# Patient Record
Sex: Male | Born: 1950
Health system: Southern US, Community
[De-identification: ages and names within clinical notes are randomized; demographics above are authoritative.]

## PROBLEM LIST (undated history)

## (undated) DIAGNOSIS — M1990 Unspecified osteoarthritis, unspecified site: Secondary | ICD-10-CM

## (undated) DIAGNOSIS — E785 Hyperlipidemia, unspecified: Secondary | ICD-10-CM

## (undated) DIAGNOSIS — Z87891 Personal history of nicotine dependence: Secondary | ICD-10-CM

## (undated) DIAGNOSIS — T7840XA Allergy, unspecified, initial encounter: Secondary | ICD-10-CM

## (undated) DIAGNOSIS — J189 Pneumonia, unspecified organism: Secondary | ICD-10-CM

## (undated) DIAGNOSIS — R0600 Dyspnea, unspecified: Secondary | ICD-10-CM

## (undated) DIAGNOSIS — K429 Umbilical hernia without obstruction or gangrene: Secondary | ICD-10-CM

## (undated) DIAGNOSIS — R569 Unspecified convulsions: Secondary | ICD-10-CM

## (undated) DIAGNOSIS — I219 Acute myocardial infarction, unspecified: Secondary | ICD-10-CM

## (undated) DIAGNOSIS — I10 Essential (primary) hypertension: Secondary | ICD-10-CM

## (undated) DIAGNOSIS — I2510 Atherosclerotic heart disease of native coronary artery without angina pectoris: Secondary | ICD-10-CM

## (undated) DIAGNOSIS — R55 Syncope and collapse: Secondary | ICD-10-CM

## (undated) HISTORY — DX: Essential (primary) hypertension: I10

## (undated) HISTORY — PX: CARDIAC CATHETERIZATION: SHX172

## (undated) HISTORY — PX: KNEE SURGERY: SHX244

## (undated) HISTORY — DX: Allergy, unspecified, initial encounter: T78.40XA

## (undated) HISTORY — PX: HERNIA REPAIR: SHX51

## (undated) HISTORY — PX: CYST EXCISION: SHX5701

## (undated) HISTORY — PX: INGUINAL HERNIA REPAIR: SUR1180

---

## 1968-09-18 DIAGNOSIS — R569 Unspecified convulsions: Secondary | ICD-10-CM

## 1968-09-18 HISTORY — DX: Unspecified convulsions: R56.9

## 2001-03-04 ENCOUNTER — Encounter: Payer: Self-pay | Admitting: Specialist

## 2001-03-06 ENCOUNTER — Ambulatory Visit (HOSPITAL_COMMUNITY): Admission: RE | Admit: 2001-03-06 | Discharge: 2001-03-06 | Payer: Self-pay | Admitting: Specialist

## 2004-05-07 ENCOUNTER — Other Ambulatory Visit: Payer: Self-pay

## 2009-07-07 ENCOUNTER — Ambulatory Visit: Payer: Self-pay | Admitting: Gastroenterology

## 2009-09-18 HISTORY — PX: COLONOSCOPY: SHX174

## 2011-04-10 ENCOUNTER — Ambulatory Visit: Payer: Self-pay | Admitting: Family Medicine

## 2011-10-31 DIAGNOSIS — M26609 Unspecified temporomandibular joint disorder, unspecified side: Secondary | ICD-10-CM | POA: Diagnosis not present

## 2011-10-31 DIAGNOSIS — D179 Benign lipomatous neoplasm, unspecified: Secondary | ICD-10-CM | POA: Diagnosis not present

## 2011-10-31 DIAGNOSIS — E785 Hyperlipidemia, unspecified: Secondary | ICD-10-CM | POA: Diagnosis not present

## 2011-10-31 DIAGNOSIS — I10 Essential (primary) hypertension: Secondary | ICD-10-CM | POA: Diagnosis not present

## 2012-01-08 DIAGNOSIS — S8010XA Contusion of unspecified lower leg, initial encounter: Secondary | ICD-10-CM | POA: Diagnosis not present

## 2012-01-09 DIAGNOSIS — M7989 Other specified soft tissue disorders: Secondary | ICD-10-CM | POA: Diagnosis not present

## 2012-01-09 DIAGNOSIS — M79609 Pain in unspecified limb: Secondary | ICD-10-CM | POA: Diagnosis not present

## 2012-05-16 DIAGNOSIS — I10 Essential (primary) hypertension: Secondary | ICD-10-CM | POA: Diagnosis not present

## 2012-05-16 DIAGNOSIS — J3089 Other allergic rhinitis: Secondary | ICD-10-CM | POA: Diagnosis not present

## 2012-05-16 DIAGNOSIS — E785 Hyperlipidemia, unspecified: Secondary | ICD-10-CM | POA: Diagnosis not present

## 2012-06-29 DIAGNOSIS — Z23 Encounter for immunization: Secondary | ICD-10-CM | POA: Diagnosis not present

## 2012-07-04 DIAGNOSIS — E785 Hyperlipidemia, unspecified: Secondary | ICD-10-CM | POA: Diagnosis not present

## 2012-12-02 DIAGNOSIS — I10 Essential (primary) hypertension: Secondary | ICD-10-CM | POA: Diagnosis not present

## 2012-12-02 DIAGNOSIS — E785 Hyperlipidemia, unspecified: Secondary | ICD-10-CM | POA: Diagnosis not present

## 2012-12-02 DIAGNOSIS — J3089 Other allergic rhinitis: Secondary | ICD-10-CM | POA: Diagnosis not present

## 2013-06-11 DIAGNOSIS — E785 Hyperlipidemia, unspecified: Secondary | ICD-10-CM | POA: Diagnosis not present

## 2013-06-11 DIAGNOSIS — Z23 Encounter for immunization: Secondary | ICD-10-CM | POA: Diagnosis not present

## 2013-06-11 DIAGNOSIS — J3089 Other allergic rhinitis: Secondary | ICD-10-CM | POA: Diagnosis not present

## 2013-06-11 DIAGNOSIS — I10 Essential (primary) hypertension: Secondary | ICD-10-CM | POA: Diagnosis not present

## 2013-11-05 DIAGNOSIS — IMO0001 Reserved for inherently not codable concepts without codable children: Secondary | ICD-10-CM | POA: Diagnosis not present

## 2013-11-05 DIAGNOSIS — M999 Biomechanical lesion, unspecified: Secondary | ICD-10-CM | POA: Diagnosis not present

## 2013-11-05 DIAGNOSIS — M5137 Other intervertebral disc degeneration, lumbosacral region: Secondary | ICD-10-CM | POA: Diagnosis not present

## 2013-11-07 DIAGNOSIS — M999 Biomechanical lesion, unspecified: Secondary | ICD-10-CM | POA: Diagnosis not present

## 2013-11-07 DIAGNOSIS — IMO0001 Reserved for inherently not codable concepts without codable children: Secondary | ICD-10-CM | POA: Diagnosis not present

## 2013-11-07 DIAGNOSIS — M5137 Other intervertebral disc degeneration, lumbosacral region: Secondary | ICD-10-CM | POA: Diagnosis not present

## 2013-11-10 DIAGNOSIS — IMO0001 Reserved for inherently not codable concepts without codable children: Secondary | ICD-10-CM | POA: Diagnosis not present

## 2013-11-10 DIAGNOSIS — M5137 Other intervertebral disc degeneration, lumbosacral region: Secondary | ICD-10-CM | POA: Diagnosis not present

## 2013-11-10 DIAGNOSIS — M999 Biomechanical lesion, unspecified: Secondary | ICD-10-CM | POA: Diagnosis not present

## 2013-11-17 DIAGNOSIS — M999 Biomechanical lesion, unspecified: Secondary | ICD-10-CM | POA: Diagnosis not present

## 2013-11-17 DIAGNOSIS — IMO0001 Reserved for inherently not codable concepts without codable children: Secondary | ICD-10-CM | POA: Diagnosis not present

## 2013-11-17 DIAGNOSIS — M5137 Other intervertebral disc degeneration, lumbosacral region: Secondary | ICD-10-CM | POA: Diagnosis not present

## 2014-05-18 DIAGNOSIS — M26609 Unspecified temporomandibular joint disorder, unspecified side: Secondary | ICD-10-CM | POA: Diagnosis not present

## 2014-05-18 DIAGNOSIS — R233 Spontaneous ecchymoses: Secondary | ICD-10-CM | POA: Diagnosis not present

## 2014-05-18 DIAGNOSIS — M5382 Other specified dorsopathies, cervical region: Secondary | ICD-10-CM | POA: Diagnosis not present

## 2014-06-08 DIAGNOSIS — E663 Overweight: Secondary | ICD-10-CM | POA: Diagnosis not present

## 2014-06-08 DIAGNOSIS — J3089 Other allergic rhinitis: Secondary | ICD-10-CM | POA: Diagnosis not present

## 2014-06-08 DIAGNOSIS — E785 Hyperlipidemia, unspecified: Secondary | ICD-10-CM | POA: Diagnosis not present

## 2014-06-08 DIAGNOSIS — I10 Essential (primary) hypertension: Secondary | ICD-10-CM | POA: Diagnosis not present

## 2014-06-08 DIAGNOSIS — M159 Polyosteoarthritis, unspecified: Secondary | ICD-10-CM | POA: Diagnosis not present

## 2014-06-08 DIAGNOSIS — D179 Benign lipomatous neoplasm, unspecified: Secondary | ICD-10-CM | POA: Diagnosis not present

## 2014-06-08 DIAGNOSIS — M26609 Unspecified temporomandibular joint disorder, unspecified side: Secondary | ICD-10-CM | POA: Diagnosis not present

## 2015-07-12 ENCOUNTER — Encounter: Payer: Self-pay | Admitting: Family Medicine

## 2015-07-12 ENCOUNTER — Ambulatory Visit (INDEPENDENT_AMBULATORY_CARE_PROVIDER_SITE_OTHER): Payer: Medicare Other | Admitting: Family Medicine

## 2015-07-12 VITALS — BP 136/80 | HR 78 | Ht 69.0 in | Wt 201.0 lb

## 2015-07-12 DIAGNOSIS — J301 Allergic rhinitis due to pollen: Secondary | ICD-10-CM

## 2015-07-12 DIAGNOSIS — I10 Essential (primary) hypertension: Secondary | ICD-10-CM | POA: Diagnosis not present

## 2015-07-12 DIAGNOSIS — J40 Bronchitis, not specified as acute or chronic: Secondary | ICD-10-CM | POA: Diagnosis not present

## 2015-07-12 DIAGNOSIS — Z23 Encounter for immunization: Secondary | ICD-10-CM | POA: Diagnosis not present

## 2015-07-12 DIAGNOSIS — J0100 Acute maxillary sinusitis, unspecified: Secondary | ICD-10-CM

## 2015-07-12 DIAGNOSIS — E785 Hyperlipidemia, unspecified: Secondary | ICD-10-CM | POA: Diagnosis not present

## 2015-07-12 MED ORDER — LORATADINE 10 MG PO TABS: 10.0000 mg | ORAL_TABLET | Freq: Every day | ORAL | Status: DC | PRN

## 2015-07-12 MED ORDER — RAMIPRIL 10 MG PO CAPS: 20.0000 mg | ORAL_CAPSULE | Freq: Every day | ORAL | Status: DC

## 2015-07-12 MED ORDER — HYDROCHLOROTHIAZIDE 12.5 MG PO CAPS: 12.5000 mg | ORAL_CAPSULE | Freq: Every day | ORAL | Status: DC

## 2015-07-12 MED ORDER — HYDROCOD POLST-CPM POLST ER 10-8 MG/5ML PO SUER: 5.0000 mL | Freq: Two times a day (BID) | ORAL | Status: DC

## 2015-07-12 MED ORDER — FISH OIL 1000 MG PO CAPS: 1.0000 | ORAL_CAPSULE | Freq: Two times a day (BID) | ORAL | Status: DC

## 2015-07-12 MED ORDER — AMOXICILLIN 500 MG PO CAPS: 500.0000 mg | ORAL_CAPSULE | Freq: Three times a day (TID) | ORAL | Status: DC

## 2015-07-12 NOTE — Progress Notes (Signed)
Name: George Doyle   MRN: [id number]    DOB: 02/23/1951   Date:07/12/2015       Progress Note  Subjective  Chief Complaint  Chief Complaint  Patient presents with  . Sinusitis    taking OTC claritin  . Hypertension    refill meds- HCTZ and Altace    Sinusitis This is a chronic problem. The current episode started 1 to 4 weeks ago. The problem has been waxing and waning since onset. There has been no fever. Associated symptoms include congestion, coughing, ear pain, sinus pressure and sneezing. Pertinent negatives include no chills, diaphoresis, headaches, hoarse voice, neck pain, shortness of breath, sore throat or swollen glands. Past treatments include acetaminophen. The treatment provided no relief.  Hypertension This is a chronic problem. The current episode started more than 1 year ago. The problem has been gradually improving since onset. The problem is controlled. Pertinent negatives include no anxiety, blurred vision, chest pain, headaches, malaise/fatigue, neck pain, orthopnea, palpitations, peripheral edema, shortness of breath or sweats. There are no associated agents to hypertension. Past treatments include ACE inhibitors and diuretics. The current treatment provides mild improvement. There are no compliance problems.  There is no history of angina, kidney disease, CAD/MI, CVA, heart failure, left ventricular hypertrophy, PVD or renovascular disease. There is no history of chronic renal disease.  Sinus Problem This is a recurrent problem. The problem has been gradually worsening since onset. Associated symptoms include congestion, coughing, ear pain, sinus pressure and sneezing. Pertinent negatives include no chills, diaphoresis, headaches, hoarse voice, neck pain, shortness of breath, sore throat or swollen glands. (Pollen and "night air")  Hyperlipidemia This is a chronic problem. The current episode started more than 1 year ago. The problem is controlled. Recent lipid  tests were reviewed and are variable. He has no history of chronic renal disease, diabetes, hypothyroidism or obesity. Pertinent negatives include no chest pain, focal sensory loss, focal weakness, leg pain, myalgias or shortness of breath. He is currently on no antihyperlipidemic treatment. The current treatment provides moderate improvement of lipids. Risk factors for coronary artery disease include dyslipidemia.    No problem-specific assessment & plan notes found for this encounter.   Past Medical History  Diagnosis Date  . Hypertension   . Allergy     Past Surgical History  Procedure Laterality Date  . Knee surgery Bilateral     2 on R) 1 on L)  . Hernia repair    . Colonoscopy  2011    cleared for 10 yrs    History reviewed. No pertinent family history.  Social History   Social History  . Marital Status: Married    Spouse Name: N/A  . Number of Children: N/A  . Years of Education: N/A   Occupational History  . Not on file.   Social History Main Topics  . Smoking status: Former Research scientist (life sciences)  . Smokeless tobacco: Not on file  . Alcohol Use: No  . Drug Use: No  . Sexual Activity: Not Currently   Other Topics Concern  . Not on file   Social History Narrative  . No narrative on file    No Known Allergies   Review of Systems  Constitutional: Negative for fever, chills, weight loss, malaise/fatigue and diaphoresis.  HENT: Positive for congestion, ear pain, sinus pressure and sneezing. Negative for ear discharge, hoarse voice and sore throat.   Eyes: Negative for blurred vision.  Respiratory: Positive for cough. Negative for sputum production, shortness of breath and  wheezing.   Cardiovascular: Negative for chest pain, palpitations, orthopnea and leg swelling.  Gastrointestinal: Negative for heartburn, nausea, abdominal pain, diarrhea, constipation, blood in stool and melena.  Genitourinary: Negative for dysuria, urgency, frequency and hematuria.  Musculoskeletal:  Negative for myalgias, back pain, joint pain and neck pain.  Skin: Negative for rash.  Neurological: Negative for dizziness, tingling, sensory change, focal weakness and headaches.  Endo/Heme/Allergies: Negative for environmental allergies and polydipsia. Does not bruise/bleed easily.  Psychiatric/Behavioral: Negative for depression and suicidal ideas. The patient is not nervous/anxious and does not have insomnia.      Objective  Filed Vitals:   07/12/15 1052  BP: 136/80  Pulse: 78  Height: 5\' 9"  (1.753 m)  Weight: 201 lb (91.173 kg)    Physical Exam  Constitutional: He is oriented to person, place, and time and well-developed, well-nourished, and in no distress.  HENT:  Head: Normocephalic.  Right Ear: External ear normal.  Left Ear: External ear normal.  Nose: Nose normal.  Mouth/Throat: Oropharynx is clear and moist.  Eyes: Conjunctivae and EOM are normal. Pupils are equal, round, and reactive to light. Right eye exhibits no discharge. Left eye exhibits no discharge. No scleral icterus.  Neck: Normal range of motion. Neck supple. No JVD present. No tracheal deviation present. No thyromegaly present.  Cardiovascular: Normal rate, regular rhythm, normal heart sounds and intact distal pulses.  Exam reveals no gallop and no friction rub.   No murmur heard. Pulmonary/Chest: Breath sounds normal. No respiratory distress. He has no wheezes. He has no rales.  Abdominal: Soft. Bowel sounds are normal. He exhibits no mass. There is no hepatosplenomegaly. There is no tenderness. There is no rebound, no guarding and no CVA tenderness.  Musculoskeletal: Normal range of motion. He exhibits no edema or tenderness.  Lymphadenopathy:    He has no cervical adenopathy.  Neurological: He is alert and oriented to person, place, and time. He has normal sensation, normal strength, normal reflexes and intact cranial nerves. No cranial nerve deficit.  Skin: Skin is warm. No rash noted.  Psychiatric:  Mood and affect normal.      Assessment & Plan  Problem List Items Addressed This Visit    None    Visit Diagnoses    Acute maxillary sinusitis, recurrence not specified    -  Primary    Relevant Medications    hydrochlorothiazide (MICROZIDE) 12.5 MG capsule    loratadine (CLARITIN) 10 MG tablet    amoxicillin (AMOXIL) 500 MG capsule    chlorpheniramine-HYDROcodone (TUSSIONEX PENNKINETIC ER) 10-8 MG/5ML SUER    Bronchitis        Relevant Medications    hydrochlorothiazide (MICROZIDE) 12.5 MG capsule    Hyperlipidemia        Relevant Medications    hydrochlorothiazide (MICROZIDE) 12.5 MG capsule    ramipril (ALTACE) 10 MG capsule    Other Relevant Orders    Lipid Profile    Essential hypertension        Relevant Medications    hydrochlorothiazide (MICROZIDE) 12.5 MG capsule    ramipril (ALTACE) 10 MG capsule    Other Relevant Orders    Renal Function Panel    Allergic rhinitis due to pollen        Relevant Medications    chlorpheniramine-HYDROcodone (TUSSIONEX PENNKINETIC ER) 10-8 MG/5ML SUER    Need for pneumococcal vaccination        Relevant Orders    Pneumococcal polysaccharide vaccine 23-valent greater than or equal to 2yo subcutaneous/IM (Completed)  Dr. [facility]  07/12/2015

## 2015-07-13 LAB — LIPID PANEL
CHOL/HDL RATIO: 4.1 ratio (ref 0.0–5.0)
Cholesterol, Total: 229 mg/dL — ABNORMAL HIGH (ref 100–199)
HDL: 56 mg/dL (ref >39)
LDL Calculated: 140 mg/dL — ABNORMAL HIGH (ref 0–99)
TRIGLYCERIDES: 164 mg/dL — AB (ref 0–149)
VLDL CHOLESTEROL CAL: 33 mg/dL (ref 5–40)

## 2015-07-13 LAB — RENAL FUNCTION PANEL
ALBUMIN: 4.2 g/dL (ref 3.6–4.8)
BUN/Creatinine Ratio: 23 — ABNORMAL HIGH (ref 10–22)
BUN: 21 mg/dL (ref 8–27)
CO2: 26 mmol/L (ref 18–29)
CREATININE: 0.92 mg/dL (ref 0.76–1.27)
Calcium: 9.9 mg/dL (ref 8.6–10.2)
Chloride: 97 mmol/L (ref 97–106)
GFR, EST AFRICAN AMERICAN: 100 mL/min/{1.73_m2} (ref >59)
GFR, EST NON AFRICAN AMERICAN: 86 mL/min/{1.73_m2} (ref >59)
GLUCOSE: 67 mg/dL (ref 65–99)
PHOSPHORUS: 3.7 mg/dL (ref 2.5–4.5)
POTASSIUM: 4.2 mmol/L (ref 3.5–5.2)
Sodium: 141 mmol/L (ref 136–144)

## 2016-01-31 DIAGNOSIS — H40003 Preglaucoma, unspecified, bilateral: Secondary | ICD-10-CM | POA: Diagnosis not present

## 2016-01-31 DIAGNOSIS — H2513 Age-related nuclear cataract, bilateral: Secondary | ICD-10-CM | POA: Diagnosis not present

## 2016-01-31 DIAGNOSIS — H524 Presbyopia: Secondary | ICD-10-CM | POA: Diagnosis not present

## 2016-02-15 DIAGNOSIS — H401131 Primary open-angle glaucoma, bilateral, mild stage: Secondary | ICD-10-CM | POA: Diagnosis not present

## 2016-03-03 ENCOUNTER — Ambulatory Visit (INDEPENDENT_AMBULATORY_CARE_PROVIDER_SITE_OTHER): Payer: Medicare HMO | Admitting: Family Medicine

## 2016-03-03 ENCOUNTER — Encounter: Payer: Self-pay | Admitting: Family Medicine

## 2016-03-03 ENCOUNTER — Other Ambulatory Visit: Payer: Self-pay | Admitting: Family Medicine

## 2016-03-03 VITALS — BP 132/80 | HR 60 | Ht 69.0 in | Wt 200.0 lb

## 2016-03-03 DIAGNOSIS — M25551 Pain in right hip: Secondary | ICD-10-CM | POA: Diagnosis not present

## 2016-03-03 DIAGNOSIS — M25511 Pain in right shoulder: Secondary | ICD-10-CM | POA: Diagnosis not present

## 2016-03-03 DIAGNOSIS — I10 Essential (primary) hypertension: Secondary | ICD-10-CM | POA: Diagnosis not present

## 2016-03-03 DIAGNOSIS — E785 Hyperlipidemia, unspecified: Secondary | ICD-10-CM | POA: Diagnosis not present

## 2016-03-03 MED ORDER — RAMIPRIL 10 MG PO CAPS: 20.0000 mg | ORAL_CAPSULE | Freq: Every day | ORAL | Status: DC

## 2016-03-03 MED ORDER — HYDROCHLOROTHIAZIDE 12.5 MG PO CAPS: 12.5000 mg | ORAL_CAPSULE | Freq: Every day | ORAL | Status: DC

## 2016-03-03 MED ORDER — MELOXICAM 15 MG PO TABS: 15.0000 mg | ORAL_TABLET | Freq: Every day | ORAL | Status: DC

## 2016-03-03 NOTE — Progress Notes (Signed)
Name: George Doyle   MRN: [id number]    DOB: March 24, 1951   Date:03/03/2016       Progress Note  Subjective  Chief Complaint  Chief Complaint  Patient presents with  . Hypertension    Hypertension This is a chronic problem. The current episode started more than 1 year ago. The problem has been gradually improving since onset. The problem is controlled. Pertinent negatives include no anxiety, blurred vision, chest pain, headaches, malaise/fatigue, neck pain, orthopnea, palpitations, peripheral edema, PND, shortness of breath or sweats. There are no associated agents to hypertension. There are no known risk factors for coronary artery disease. Past treatments include ACE inhibitors and diuretics. The current treatment provides mild improvement. There are no compliance problems.  There is no history of angina, kidney disease, CAD/MI, CVA, heart failure, left ventricular hypertrophy, PVD, renovascular disease or retinopathy. There is no history of chronic renal disease or a hypertension causing med.  Hyperlipidemia This is a chronic problem. The current episode started more than 1 year ago. The problem is controlled. Recent lipid tests were reviewed and are normal. He has no history of chronic renal disease. Pertinent negatives include no chest pain, focal weakness, myalgias or shortness of breath. Treatments tried: omega 3. The current treatment provides mild improvement of lipids. There are no compliance problems.  Risk factors for coronary artery disease include hypertension.    No problem-specific assessment & plan notes found for this encounter.   Past Medical History  Diagnosis Date  . Hypertension   . Allergy     Past Surgical History  Procedure Laterality Date  . Knee surgery Bilateral     2 on R) 1 on L)  . Hernia repair    . Colonoscopy  2011    cleared for 10 yrs    History reviewed. No pertinent family history.  Social History   Social History  . Marital  Status: Married    Spouse Name: N/A  . Number of Children: N/A  . Years of Education: N/A   Occupational History  . Not on file.   Social History Main Topics  . Smoking status: Former Research scientist (life sciences)  . Smokeless tobacco: Not on file  . Alcohol Use: No  . Drug Use: No  . Sexual Activity: Not Currently   Other Topics Concern  . Not on file   Social History Narrative    No Known Allergies   Review of Systems  Constitutional: Negative for fever, chills, weight loss and malaise/fatigue.  HENT: Negative for ear discharge, ear pain and sore throat.   Eyes: Negative for blurred vision.  Respiratory: Negative for cough, sputum production, shortness of breath and wheezing.   Cardiovascular: Negative for chest pain, palpitations, orthopnea, leg swelling and PND.  Gastrointestinal: Negative for heartburn, nausea, abdominal pain, diarrhea, constipation, blood in stool and melena.  Genitourinary: Negative for dysuria, urgency, frequency and hematuria.  Musculoskeletal: Negative for myalgias, back pain, joint pain and neck pain.  Skin: Negative for rash.  Neurological: Negative for dizziness, tingling, sensory change, focal weakness and headaches.  Endo/Heme/Allergies: Negative for environmental allergies and polydipsia. Does not bruise/bleed easily.  Psychiatric/Behavioral: Negative for depression and suicidal ideas. The patient is not nervous/anxious and does not have insomnia.      Objective  Filed Vitals:   03/03/16 0850  BP: 132/80  Pulse: 60  Height: 5\' 9"  (1.753 m)  Weight: 200 lb (90.719 kg)    Physical Exam  Constitutional: He is oriented to person, place, and time  and well-developed, well-nourished, and in no distress.  HENT:  Head: Normocephalic.  Right Ear: External ear normal.  Left Ear: External ear normal.  Nose: Nose normal.  Mouth/Throat: Oropharynx is clear and moist.  Eyes: Conjunctivae and EOM are normal. Pupils are equal, round, and reactive to light. Right eye  exhibits no discharge. Left eye exhibits no discharge. No scleral icterus.  Neck: Normal range of motion. Neck supple. No JVD present. No tracheal deviation present. No thyromegaly present.  Cardiovascular: Normal rate, regular rhythm, normal heart sounds and intact distal pulses.  Exam reveals no gallop and no friction rub.   No murmur heard. Pulmonary/Chest: Breath sounds normal. No respiratory distress. He has no wheezes. He has no rales.  Abdominal: Soft. Bowel sounds are normal. He exhibits no mass. There is no hepatosplenomegaly. There is no tenderness. There is no rebound, no guarding and no CVA tenderness.  Musculoskeletal: Normal range of motion. He exhibits no edema or tenderness.  Lymphadenopathy:    He has no cervical adenopathy.  Neurological: He is alert and oriented to person, place, and time. He has normal sensation, normal strength, normal reflexes and intact cranial nerves. No cranial nerve deficit.  Skin: Skin is warm. No rash noted.  Psychiatric: Mood and affect normal.  Nursing note and vitals reviewed.     Assessment & Plan  Problem List Items Addressed This Visit      Cardiovascular and Mediastinum   Essential hypertension - Primary   Relevant Medications   hydrochlorothiazide (MICROZIDE) 12.5 MG capsule   ramipril (ALTACE) 10 MG capsule    Other Visit Diagnoses    Hip pain, acute, right        Relevant Medications    meloxicam (MOBIC) 15 MG tablet    Right shoulder pain        Relevant Medications    meloxicam (MOBIC) 15 MG tablet    Hyperlipidemia        Relevant Medications    hydrochlorothiazide (MICROZIDE) 12.5 MG capsule    ramipril (ALTACE) 10 MG capsule    Other Relevant Orders    Lipid Profile         Dr. Otilio Miu [facility]  03/03/2016

## 2016-03-04 LAB — LIPID PANEL
CHOL/HDL RATIO: 3.2 ratio (ref 0.0–5.0)
Cholesterol, Total: 201 mg/dL — ABNORMAL HIGH (ref 100–199)
HDL: 63 mg/dL (ref >39)
LDL CALC: 120 mg/dL — AB (ref 0–99)
TRIGLYCERIDES: 90 mg/dL (ref 0–149)
VLDL CHOLESTEROL CAL: 18 mg/dL (ref 5–40)

## 2016-05-16 DIAGNOSIS — H401131 Primary open-angle glaucoma, bilateral, mild stage: Secondary | ICD-10-CM | POA: Diagnosis not present

## 2016-05-16 DIAGNOSIS — H2513 Age-related nuclear cataract, bilateral: Secondary | ICD-10-CM | POA: Diagnosis not present

## 2016-05-16 DIAGNOSIS — H524 Presbyopia: Secondary | ICD-10-CM | POA: Diagnosis not present

## 2016-05-16 DIAGNOSIS — H40003 Preglaucoma, unspecified, bilateral: Secondary | ICD-10-CM | POA: Diagnosis not present

## 2016-07-12 DIAGNOSIS — R69 Illness, unspecified: Secondary | ICD-10-CM | POA: Diagnosis not present

## 2016-10-12 ENCOUNTER — Ambulatory Visit (INDEPENDENT_AMBULATORY_CARE_PROVIDER_SITE_OTHER): Payer: Medicare HMO | Admitting: Family Medicine

## 2016-10-12 VITALS — BP 120/84 | HR 68 | Ht 69.0 in | Wt 203.0 lb

## 2016-10-12 DIAGNOSIS — I10 Essential (primary) hypertension: Secondary | ICD-10-CM | POA: Diagnosis not present

## 2016-10-12 DIAGNOSIS — E782 Mixed hyperlipidemia: Secondary | ICD-10-CM | POA: Diagnosis not present

## 2016-10-12 DIAGNOSIS — Z1211 Encounter for screening for malignant neoplasm of colon: Secondary | ICD-10-CM

## 2016-10-12 DIAGNOSIS — Z23 Encounter for immunization: Secondary | ICD-10-CM | POA: Diagnosis not present

## 2016-10-12 LAB — HEMOCCULT GUIAC POC 1CARD (OFFICE): Fecal Occult Blood, POC: NEGATIVE

## 2016-10-12 LAB — POCT URINALYSIS DIPSTICK
Bilirubin, UA: NEGATIVE
Blood, UA: NEGATIVE
Glucose, UA: NEGATIVE
KETONES UA: NEGATIVE
Leukocytes, UA: NEGATIVE
Nitrite, UA: NEGATIVE
PH UA: 5
PROTEIN UA: NEGATIVE
Spec Grav, UA: <=1.005
Urobilinogen, UA: 0.2

## 2016-10-12 MED ORDER — HYDROCHLOROTHIAZIDE 12.5 MG PO CAPS: 12.5000 mg | ORAL_CAPSULE | Freq: Every day | ORAL | 6 refills | Status: DC

## 2016-10-12 MED ORDER — RAMIPRIL 10 MG PO CAPS: 20.0000 mg | ORAL_CAPSULE | Freq: Every day | ORAL | 6 refills | Status: DC

## 2016-10-12 MED ORDER — RAMIPRIL 10 MG PO CAPS: 20.0000 mg | ORAL_CAPSULE | Freq: Every day | ORAL | 7 refills | Status: DC

## 2016-10-12 MED ORDER — FISH OIL 1000 MG PO CAPS: 1.0000 | ORAL_CAPSULE | Freq: Two times a day (BID) | ORAL | 11 refills | Status: DC

## 2016-10-12 MED ORDER — HYDROCHLOROTHIAZIDE 12.5 MG PO CAPS: 12.5000 mg | ORAL_CAPSULE | Freq: Every day | ORAL | 7 refills | Status: DC

## 2016-10-12 NOTE — Progress Notes (Signed)
Name: George Doyle   MRN: [id number]    DOB: [birth year]   Date:10/12/2016       Progress Note  Subjective  Chief Complaint  Chief Complaint  Patient presents with  . Hypertension    Hypertension  This is a chronic problem. The current episode started more than 1 year ago. The problem is unchanged. The problem is controlled. Pertinent negatives include no anxiety, blurred vision, chest pain, headaches, malaise/fatigue, neck pain, orthopnea, palpitations, peripheral edema, PND, shortness of breath or sweats. There are no associated agents to hypertension. Risk factors for coronary artery disease include male gender. Past treatments include diuretics and ACE inhibitors. The current treatment provides mild improvement. There are no compliance problems.  There is no history of angina, kidney disease, CAD/MI, CVA, heart failure, left ventricular hypertrophy, PVD, renovascular disease or retinopathy. There is no history of chronic renal disease or a hypertension causing med.  Hyperlipidemia  This is a chronic problem. The problem is controlled. Recent lipid tests were reviewed and are normal. He has no history of chronic renal disease. Factors aggravating his hyperlipidemia include fatty foods. Pertinent negatives include no chest pain, focal sensory loss, focal weakness, leg pain, myalgias or shortness of breath. Treatments tried: omega. The current treatment provides no improvement of lipids. There are no compliance problems.  Risk factors for coronary artery disease include hypertension and dyslipidemia.    No problem-specific Assessment & Plan notes found for this encounter.   Past Medical History:  Diagnosis Date  . Allergy   . Hypertension     Past Surgical History:  Procedure Laterality Date  . COLONOSCOPY  2011   cleared for 10 yrs  . HERNIA REPAIR    . KNEE SURGERY Bilateral    2 on R) 1 on L)    No family history on file.  Social History   Social History  .  Marital status: Married    Spouse name: N/A  . Number of children: N/A  . Years of education: N/A   Occupational History  . Not on file.   Social History Main Topics  . Smoking status: Former Research scientist (life sciences)  . Smokeless tobacco: Not on file  . Alcohol use No  . Drug use: No  . Sexual activity: Not Currently   Other Topics Concern  . Not on file   Social History Narrative  . No narrative on file    No Known Allergies   Review of Systems  Constitutional: Negative for chills, fever, malaise/fatigue and weight loss.  HENT: Negative for ear discharge, ear pain and sore throat.   Eyes: Negative for blurred vision.  Respiratory: Negative for cough, sputum production, shortness of breath and wheezing.   Cardiovascular: Negative for chest pain, palpitations, orthopnea, leg swelling and PND.  Gastrointestinal: Negative for abdominal pain, blood in stool, constipation, diarrhea, heartburn, melena and nausea.  Genitourinary: Negative for dysuria, frequency, hematuria and urgency.  Musculoskeletal: Negative for back pain, joint pain, myalgias and neck pain.  Skin: Negative for rash.  Neurological: Negative for dizziness, tingling, sensory change, focal weakness and headaches.  Endo/Heme/Allergies: Negative for environmental allergies and polydipsia. Does not bruise/bleed easily.  Psychiatric/Behavioral: Negative for depression and suicidal ideas. The patient is not nervous/anxious and does not have insomnia.      Objective  Vitals:   10/12/16 0858  BP: 120/84  Pulse: 68  Weight: 203 lb (92.1 kg)  Height: 5\' 9"  (1.753 m)    Physical Exam  Constitutional: He is oriented to person,  place, and time and well-developed, well-nourished, and in no distress.  HENT:  Head: Normocephalic.  Right Ear: External ear normal.  Left Ear: External ear normal.  Nose: Nose normal.  Mouth/Throat: Oropharynx is clear and moist.  Eyes: Conjunctivae and EOM are normal. Pupils are equal, round, and  reactive to light. Right eye exhibits no discharge. Left eye exhibits no discharge. No scleral icterus.  Neck: Normal range of motion. Neck supple. No JVD present. No tracheal deviation present. No thyromegaly present.  Cardiovascular: Normal rate, regular rhythm, normal heart sounds and intact distal pulses.  Exam reveals no gallop and no friction rub.   No murmur heard. Pulmonary/Chest: Breath sounds normal. No respiratory distress. He has no wheezes. He has no rales.  Abdominal: Soft. Bowel sounds are normal. He exhibits no mass. There is no hepatosplenomegaly. There is no tenderness. There is no rebound, no guarding and no CVA tenderness.  Musculoskeletal: Normal range of motion. He exhibits no edema or tenderness.  Lymphadenopathy:    He has no cervical adenopathy.  Neurological: He is alert and oriented to person, place, and time. He has normal sensation, normal strength and intact cranial nerves. No cranial nerve deficit.  Skin: Skin is warm. No rash noted.  Psychiatric: Mood and affect normal.  Nursing note and vitals reviewed.     Assessment & Plan  Problem List Items Addressed This Visit      Cardiovascular and Mediastinum   Essential hypertension - Primary   Relevant Medications   hydrochlorothiazide (MICROZIDE) 12.5 MG capsule   ramipril (ALTACE) 10 MG capsule   Other Relevant Orders   Renal Function Panel   POCT urinalysis dipstick (Completed)    Other Visit Diagnoses    Mixed hyperlipidemia       Relevant Medications   Omega-3 Fatty Acids (FISH OIL) 1000 MG CAPS   hydrochlorothiazide (MICROZIDE) 12.5 MG capsule   ramipril (ALTACE) 10 MG capsule   Other Relevant Orders   Lipid Profile   Immunization due       Relevant Orders   Tdap vaccine greater than or equal to 7yo IM (Completed)   Pneumococcal conjugate vaccine 13-valent IM (Completed)   Colon cancer screening       Relevant Orders   Ambulatory referral to Gastroenterology   POCT occult blood stool  (Completed)        Dr. Otilio Miu [facility]  10/12/16

## 2016-10-13 DIAGNOSIS — I739 Peripheral vascular disease, unspecified: Secondary | ICD-10-CM | POA: Diagnosis not present

## 2016-10-13 LAB — RENAL FUNCTION PANEL
Albumin: 4.4 g/dL (ref 3.6–4.8)
BUN / CREAT RATIO: 21 (ref 10–24)
BUN: 19 mg/dL (ref 8–27)
CALCIUM: 9.6 mg/dL (ref 8.6–10.2)
CHLORIDE: 98 mmol/L (ref 96–106)
CO2: 27 mmol/L (ref 18–29)
Creatinine, Ser: 0.91 mg/dL (ref 0.76–1.27)
GFR calc non Af Amer: 87 mL/min/{1.73_m2} (ref >59)
GFR, EST AFRICAN AMERICAN: 100 mL/min/{1.73_m2} (ref >59)
GLUCOSE: 86 mg/dL (ref 65–99)
POTASSIUM: 5 mmol/L (ref 3.5–5.2)
Phosphorus: 2.8 mg/dL (ref 2.5–4.5)
SODIUM: 141 mmol/L (ref 134–144)

## 2016-10-13 LAB — LIPID PANEL
CHOLESTEROL TOTAL: 223 mg/dL — AB (ref 100–199)
Chol/HDL Ratio: 3.5 ratio units (ref 0.0–5.0)
HDL: 64 mg/dL (ref >39)
LDL Calculated: 136 mg/dL — ABNORMAL HIGH (ref 0–99)
Triglycerides: 113 mg/dL (ref 0–149)
VLDL CHOLESTEROL CAL: 23 mg/dL (ref 5–40)

## 2016-10-20 ENCOUNTER — Telehealth: Payer: Self-pay

## 2016-10-20 ENCOUNTER — Other Ambulatory Visit: Payer: Self-pay

## 2016-10-20 NOTE — Telephone Encounter (Signed)
Gastroenterology Pre-Procedure Review  Request Date:  Requesting Physician: Dr.   PATIENT REVIEW QUESTIONS: The patient responded to the following health history questions as indicated:    1. Are you having any GI issues? no 2. Do you have a personal history of Polyps? no 3. Do you have a family history of Colon Cancer or Polyps? no 4. Diabetes Mellitus? no 5. Joint replacements in the past 12 months?no 6. Major health problems in the past 3 months?no 7. Any artificial heart valves, MVP, or defibrillator?no    MEDICATIONS & ALLERGIES:    Patient reports the following regarding taking any anticoagulation/antiplatelet therapy:   Plavix, Coumadin, Eliquis, Xarelto, Lovenox, Pradaxa, Brilinta, or Effient? no Aspirin? no  Patient confirms/reports the following medications:  Current Outpatient Prescriptions  Medication Sig Dispense Refill  . hydrochlorothiazide (MICROZIDE) 12.5 MG capsule Take 1 capsule (12.5 mg total) by mouth daily. 30 capsule 6  . Omega-3 Fatty Acids (FISH OIL) 1000 MG CAPS Take 1 capsule (1,000 mg total) by mouth 2 (two) times daily. 30 capsule 11  . ramipril (ALTACE) 10 MG capsule Take 2 capsules (20 mg total) by mouth daily. 60 capsule 6   No current facility-administered medications for this visit.     Patient confirms/reports the following allergies:  No Known Allergies  No orders of the defined types were placed in this encounter.   AUTHORIZATION INFORMATION Primary Insurance: 1D#: Group #:  Secondary Insurance: 1D#: Group #:  SCHEDULE INFORMATION: Date: 11/02/16 Time: Location: [facility]

## 2016-10-23 DIAGNOSIS — H40003 Preglaucoma, unspecified, bilateral: Secondary | ICD-10-CM | POA: Diagnosis not present

## 2016-10-23 DIAGNOSIS — H401131 Primary open-angle glaucoma, bilateral, mild stage: Secondary | ICD-10-CM | POA: Diagnosis not present

## 2016-10-23 DIAGNOSIS — H2513 Age-related nuclear cataract, bilateral: Secondary | ICD-10-CM | POA: Diagnosis not present

## 2016-10-23 DIAGNOSIS — H524 Presbyopia: Secondary | ICD-10-CM | POA: Diagnosis not present

## 2016-10-31 ENCOUNTER — Encounter: Payer: Self-pay | Admitting: *Deleted

## 2016-11-01 ENCOUNTER — Other Ambulatory Visit: Payer: Self-pay

## 2016-11-01 MED ORDER — PEG 3350-KCL-NABCB-NACL-NASULF 236 G PO SOLR: ORAL | 0 refills | Status: DC

## 2016-11-03 NOTE — Discharge Instructions (Signed)

## 2016-11-06 ENCOUNTER — Ambulatory Visit
Admission: RE | Admit: 2016-11-06 | Discharge: 2016-11-06 | Disposition: A | Discharge status: Home / Self Care | Payer: Medicare HMO | Source: Ambulatory Visit | Attending: Gastroenterology | Admitting: Gastroenterology

## 2016-11-06 ENCOUNTER — Ambulatory Visit: Payer: Medicare HMO | Admitting: Student in an Organized Health Care Education/Training Program

## 2016-11-06 ENCOUNTER — Telehealth: Payer: Self-pay

## 2016-11-06 ENCOUNTER — Encounter
Admission: RE | Disposition: A | Discharge status: Home / Self Care | Payer: Self-pay | Source: Ambulatory Visit | Attending: Gastroenterology

## 2016-11-06 DIAGNOSIS — K641 Second degree hemorrhoids: Secondary | ICD-10-CM | POA: Diagnosis not present

## 2016-11-06 DIAGNOSIS — D125 Benign neoplasm of sigmoid colon: Secondary | ICD-10-CM | POA: Diagnosis not present

## 2016-11-06 DIAGNOSIS — Z87891 Personal history of nicotine dependence: Secondary | ICD-10-CM | POA: Diagnosis not present

## 2016-11-06 DIAGNOSIS — M1990 Unspecified osteoarthritis, unspecified site: Secondary | ICD-10-CM | POA: Diagnosis not present

## 2016-11-06 DIAGNOSIS — Z79899 Other long term (current) drug therapy: Secondary | ICD-10-CM | POA: Insufficient documentation

## 2016-11-06 DIAGNOSIS — K5730 Diverticulosis of large intestine without perforation or abscess without bleeding: Secondary | ICD-10-CM | POA: Insufficient documentation

## 2016-11-06 DIAGNOSIS — D122 Benign neoplasm of ascending colon: Secondary | ICD-10-CM | POA: Diagnosis not present

## 2016-11-06 DIAGNOSIS — Z1211 Encounter for screening for malignant neoplasm of colon: Secondary | ICD-10-CM | POA: Diagnosis not present

## 2016-11-06 DIAGNOSIS — I10 Essential (primary) hypertension: Secondary | ICD-10-CM | POA: Diagnosis not present

## 2016-11-06 DIAGNOSIS — K635 Polyp of colon: Secondary | ICD-10-CM

## 2016-11-06 DIAGNOSIS — Z01810 Encounter for preprocedural cardiovascular examination: Secondary | ICD-10-CM

## 2016-11-06 HISTORY — PX: POLYPECTOMY: SHX5525

## 2016-11-06 HISTORY — DX: Unspecified osteoarthritis, unspecified site: M19.90

## 2016-11-06 HISTORY — PX: COLONOSCOPY WITH PROPOFOL: SHX5780

## 2016-11-06 HISTORY — DX: Unspecified convulsions: R56.9

## 2016-11-06 SURGERY — COLONOSCOPY WITH PROPOFOL
Anesthesia: Monitor Anesthesia Care | Wound class: Contaminated

## 2016-11-06 MED ORDER — STERILE WATER FOR IRRIGATION IR SOLN
Status: DC | PRN
  Administered 2016-11-06: 08:00:00

## 2016-11-06 MED ORDER — ACETAMINOPHEN 160 MG/5ML PO SOLN: 325.0000 mg | ORAL | Status: DC | PRN

## 2016-11-06 MED ORDER — PROPOFOL 10 MG/ML IV BOLUS
INTRAVENOUS | Status: DC | PRN
  Administered 2016-11-06 (×6): 20 mg via INTRAVENOUS
  Administered 2016-11-06: 60 mg via INTRAVENOUS
  Administered 2016-11-06: 20 mg via INTRAVENOUS
  Administered 2016-11-06: 40 mg via INTRAVENOUS
  Administered 2016-11-06: 30 mg via INTRAVENOUS

## 2016-11-06 MED ORDER — LACTATED RINGERS IV SOLN
INTRAVENOUS | Status: DC
  Administered 2016-11-06 (×2): via INTRAVENOUS

## 2016-11-06 MED ORDER — LIDOCAINE HCL (CARDIAC) 20 MG/ML IV SOLN
INTRAVENOUS | Status: DC | PRN
  Administered 2016-11-06: 20 mg via INTRAVENOUS

## 2016-11-06 MED ORDER — ACETAMINOPHEN 325 MG PO TABS: 325.0000 mg | ORAL_TABLET | ORAL | Status: DC | PRN

## 2016-11-06 SURGICAL SUPPLY — 23 items
CANISTER SUCT 1200ML W/VALVE (MISCELLANEOUS) ×3 IMPLANT
CLIP HMST 235XBRD CATH ROT (MISCELLANEOUS) IMPLANT
CLIP RESOLUTION 360 11X235 (MISCELLANEOUS)
FCP ESCP3.2XJMB 240X2.8X (MISCELLANEOUS)
FORCEPS BIOP RAD 4 LRG CAP 4 (CUTTING FORCEPS) ×3 IMPLANT
FORCEPS BIOP RJ4 240 W/NDL (MISCELLANEOUS)
FORCEPS ESCP3.2XJMB 240X2.8X (MISCELLANEOUS) IMPLANT
GOWN CVR UNV OPN BCK APRN NK (MISCELLANEOUS) ×4 IMPLANT
GOWN ISOL THUMB LOOP REG UNIV (MISCELLANEOUS) ×6
INJECTOR VARIJECT VIN23 (MISCELLANEOUS) IMPLANT
KIT DEFENDO VALVE AND CONN (KITS) IMPLANT
KIT ENDO PROCEDURE OLY (KITS) ×3 IMPLANT
MARKER SPOT ENDO TATTOO 5ML (MISCELLANEOUS) IMPLANT
PAD GROUND ADULT SPLIT (MISCELLANEOUS) IMPLANT
PROBE APC STR FIRE (PROBE) IMPLANT
RETRIEVER NET ROTH 2.5X230 LF (MISCELLANEOUS) IMPLANT
SNARE SHORT THROW 13M SML OVAL (MISCELLANEOUS) ×3 IMPLANT
SNARE SHORT THROW 30M LRG OVAL (MISCELLANEOUS) IMPLANT
SNARE SNG USE RND 15MM (INSTRUMENTS) IMPLANT
SPOT EX ENDOSCOPIC TATTOO (MISCELLANEOUS)
TRAP ETRAP POLY (MISCELLANEOUS) ×3 IMPLANT
VARIJECT INJECTOR VIN23 (MISCELLANEOUS)
WATER STERILE IRR 250ML POUR (IV SOLUTION) ×3 IMPLANT

## 2016-11-06 NOTE — H&P (Signed)
  George Lame, MD [facility] [address], [place], [place] [address] Phone: [phone] Fax : [phone]  Primary Care Physician:  Otilio Miu, MD Primary Gastroenterologist:  Dr. Allen Norris  Pre-Procedure History & Physical: HPI:  George Doyle is a 66 y.o. male is here for a screening colonoscopy.   Past Medical History:  Diagnosis Date  . Allergy   . Arthritis    knees, hands  . Hypertension   . Seizures ([place])    approx 25+ yrs ago.  Testing showed no cause. None since    Past Surgical History:  Procedure Laterality Date  . COLONOSCOPY  2011   cleared for 10 yrs  . HERNIA REPAIR    . KNEE SURGERY Bilateral    2 on R) 1 on L)    Prior to Admission medications   Medication Sig Start Date End Date Taking? Authorizing Provider  hydrochlorothiazide (MICROZIDE) 12.5 MG capsule Take 1 capsule (12.5 mg total) by mouth daily. 10/12/16  Yes Juline Patch, MD  Omega-3 Fatty Acids (FISH OIL) 1000 MG CAPS Take 1 capsule (1,000 mg total) by mouth 2 (two) times daily. 10/12/16  Yes Juline Patch, MD  polyethylene glycol (GOLYTELY) 236 g solution Drink one 8 oz glass every 20 mins until stools are clear 11/01/16  Yes George Lame, MD  ramipril (ALTACE) 10 MG capsule Take 2 capsules (20 mg total) by mouth daily. 10/12/16  Yes Juline Patch, MD    Allergies as of 10/20/2016  . (No Known Allergies)    History reviewed. No pertinent family history.  Social History   Social History  . Marital status: Married    Spouse name: N/A  . Number of children: N/A  . Years of education: N/A   Occupational History  . Not on file.   Social History Main Topics  . Smoking status: Former Research scientist (life sciences)  . Smokeless tobacco: Never Used     Comment: quit over 20 yrs ago  . Alcohol use 0.0 oz/week     Comment: 1-2 drinks/year  . Drug use: No  . Sexual activity: Not Currently   Other Topics Concern  . Not on file   Social History Narrative  . No narrative on file    Review of  Systems: See HPI, otherwise negative ROS  Physical Exam: BP (!) 148/74   Pulse 64   Temp (!) 96.6 F (35.9 C) (Tympanic)   Resp 16   Ht 5\' 9"  (1.753 m)   Wt 197 lb (89.4 kg)   SpO2 100%   BMI 29.09 kg/m  General:   Alert,  pleasant and cooperative in NAD Head:  Normocephalic and atraumatic. Neck:  Supple; no masses or thyromegaly. Lungs:  Clear throughout to auscultation.    Heart:  Regular rate and rhythm. Abdomen:  Soft, nontender and nondistended. Normal bowel sounds, without guarding, and without rebound.   Neurologic:  Alert and  oriented x4;  grossly normal neurologically.  Impression/Plan: [name] is now here to undergo a screening colonoscopy.  Risks, benefits, and alternatives regarding colonoscopy have been reviewed with the patient.  Questions have been answered.  All parties agreeable.

## 2016-11-06 NOTE — Anesthesia Postprocedure Evaluation (Signed)
Anesthesia Post Note  Patient: George Doyle  Procedure(s) Performed: Procedure(s) (LRB): COLONOSCOPY WITH PROPOFOL (N/A) POLYPECTOMY  Patient location during evaluation: PACU Anesthesia Type: MAC Level of consciousness: awake and alert and oriented Pain management: satisfactory to patient Vital Signs Assessment: post-procedure vital signs reviewed and stable Respiratory status: spontaneous breathing, nonlabored ventilation and respiratory function stable Cardiovascular status: blood pressure returned to baseline and stable Postop Assessment: Adequate PO intake and No signs of nausea or vomiting Anesthetic complications: no    [name]

## 2016-11-06 NOTE — Op Note (Signed)
[facility] Patient Name: George Doyle Procedure Date: 11/06/2016 7:43 AM MRN: [id number] Account #: [id number] Date of Birth: 08/01/1951 Admit Type: Outpatient Age: 66 Room: [facility] OR ROOM 01 Gender: Male Note Status: Finalized Procedure:            Colonoscopy Indications:          Screening for colorectal malignant neoplasm Providers:            [name] MD, MD Referring MD:         Juline Patch, MD (Referring MD) Medicines:            Propofol per Anesthesia Complications:        No immediate complications. Procedure:            Pre-Anesthesia Assessment:                       - Prior to the procedure, a History and Physical was                        performed, and patient medications and allergies were                        reviewed. [name] tolerance of previous                        anesthesia was also reviewed. The risks and benefits of                        the procedure and the sedation options and risks were                        discussed with the patient. All questions were                        answered, and informed consent was obtained. Prior                        Anticoagulants: The patient has taken no previous                        anticoagulant or antiplatelet agents. ASA Grade                        Assessment: II - A patient with mild systemic disease.                        After reviewing the risks and benefits, the patient was                        deemed in satisfactory condition to undergo the                        procedure.                       After obtaining informed consent, the colonoscope was                        passed under direct vision. Throughout the procedure,  the patient's blood pressure, pulse, and oxygen                        saturations were monitored continuously. The [place] [id number]) was introduced through the                         anus and advanced to the the cecum, identified by                        appendiceal orifice and ileocecal valve. The                        colonoscopy was performed without difficulty. The                        patient tolerated the procedure well. The quality of                        the bowel preparation was excellent. Findings:      The perianal and digital rectal examinations were normal.      A 3 mm polyp was found in the ascending colon. The polyp was sessile.       The polyp was removed with a cold biopsy forceps. Resection and       retrieval were complete.      A 6 mm polyp was found in the sigmoid colon. The polyp was sessile. The       polyp was removed with a cold snare. Resection and retrieval were       complete.      A 2 mm polyp was found in the sigmoid colon. The polyp was sessile. The       polyp was removed with a cold biopsy forceps. Resection and retrieval       were complete.      A few small-mouthed diverticula were found in the sigmoid colon.      Non-bleeding internal hemorrhoids were found during retroflexion. The       hemorrhoids were Grade II (internal hemorrhoids that prolapse but reduce       spontaneously). Impression:           - One 3 mm polyp in the ascending colon, removed with a                        cold biopsy forceps. Resected and retrieved.                       - One 6 mm polyp in the sigmoid colon, removed with a                        cold snare. Resected and retrieved.                       - One 2 mm polyp in the sigmoid colon, removed with a                        cold biopsy forceps. Resected and retrieved.                       -  Diverticulosis in the sigmoid colon.                       - Non-bleeding internal hemorrhoids. Recommendation:       - Discharge patient to home.                       - Resume previous diet.                       - Continue present medications.                       - Await  pathology results.                       - Repeat colonoscopy in 5 years if polyp adenoma and 10                        years if hyperplastic Procedure Code(s):    --- Professional ---                       [phone], Colonoscopy, flexible; with removal of tumor(s),                        polyp(s), or other lesion(s) by snare technique                       45380, 34, Colonoscopy, flexible; with biopsy, single                        or multiple Diagnosis Code(s):    --- Professional ---                       Z12.11, Encounter for screening for malignant neoplasm                        of colon                       D12.2, Benign neoplasm of ascending colon                       D12.5, Benign neoplasm of sigmoid colon CPT copyright 2016 American Medical Association. All rights reserved. The codes documented in this report are preliminary and upon coder review may  be revised to meet current compliance requirements. [name] MD, MD 11/06/2016 8:15:56 AM This report has been signed electronically. Number of Addenda: 0 Note Initiated On: 11/06/2016 7:43 AM Scope Withdrawal Time: 0 hours 8 minutes 28 seconds  Total Procedure Duration: 0 hours 16 minutes 12 seconds       [facility]

## 2016-11-06 NOTE — Transfer of Care (Signed)
Immediate Anesthesia Transfer of Care Note  Patient: George Doyle  Procedure(s) Performed: Procedure(s): COLONOSCOPY WITH PROPOFOL (N/A) POLYPECTOMY  Patient Location: PACU  Anesthesia Type: MAC  Level of Consciousness: awake, alert  and patient cooperative  Airway and Oxygen Therapy: Patient Spontanous Breathing and Patient connected to supplemental oxygen  Post-op Assessment: Post-op Vital signs reviewed, Patient's Cardiovascular Status Stable, Respiratory Function Stable, Patent Airway and No signs of Nausea or vomiting  Post-op Vital Signs: Reviewed and stable  Complications: No apparent anesthesia complications

## 2016-11-06 NOTE — Anesthesia Preprocedure Evaluation (Signed)
Anesthesia Evaluation  Patient identified by MRN, date of birth, ID band Patient awake    Reviewed: Allergy & Precautions, H&P , NPO status , Patient's Chart, lab work & pertinent test results  Airway Mallampati: III  TM Distance: >3 FB Neck ROM: full    Dental no notable dental hx.    Pulmonary former smoker,    Pulmonary exam normal        Cardiovascular hypertension, Normal cardiovascular exam     Neuro/Psych    GI/Hepatic   Endo/Other    Renal/GU      Musculoskeletal   Abdominal   Peds  Hematology   Anesthesia Other Findings   Reproductive/Obstetrics                             Anesthesia Physical Anesthesia Plan  ASA: II  Anesthesia Plan: MAC   Post-op Pain Management:    Induction:   Airway Management Planned:   Additional Equipment:   Intra-op Plan:   Post-operative Plan:   Informed Consent: I have reviewed the patients History and Physical, chart, labs and discussed the procedure including the risks, benefits and alternatives for the proposed anesthesia with the patient or authorized representative who has indicated his/her understanding and acceptance.     Plan Discussed with:   Anesthesia Plan Comments:         Anesthesia Quick Evaluation

## 2016-11-06 NOTE — Anesthesia Procedure Notes (Signed)
Procedure Name: MAC Performed by: Brylee Berk Pre-anesthesia Checklist: Patient identified, Emergency Drugs available, Suction available, Patient being monitored and Timeout performed Patient Re-evaluated:Patient Re-evaluated prior to inductionOxygen Delivery Method: Nasal cannula Preoxygenation: Pre-oxygenation with 100% oxygen       

## 2016-11-07 ENCOUNTER — Encounter: Payer: Self-pay | Admitting: Gastroenterology

## 2016-11-08 ENCOUNTER — Encounter: Payer: Self-pay | Admitting: Gastroenterology

## 2016-11-09 ENCOUNTER — Encounter: Payer: Self-pay | Admitting: Gastroenterology

## 2016-12-06 NOTE — Telephone Encounter (Signed)
Error

## 2016-12-21 ENCOUNTER — Other Ambulatory Visit: Payer: Self-pay

## 2017-02-13 DIAGNOSIS — M25511 Pain in right shoulder: Secondary | ICD-10-CM | POA: Diagnosis not present

## 2017-02-13 DIAGNOSIS — G8929 Other chronic pain: Secondary | ICD-10-CM | POA: Diagnosis not present

## 2017-02-13 DIAGNOSIS — M7551 Bursitis of right shoulder: Secondary | ICD-10-CM | POA: Diagnosis not present

## 2017-04-04 ENCOUNTER — Other Ambulatory Visit: Payer: Self-pay | Admitting: Family Medicine

## 2017-04-09 DIAGNOSIS — R69 Illness, unspecified: Secondary | ICD-10-CM | POA: Diagnosis not present

## 2017-04-26 ENCOUNTER — Other Ambulatory Visit: Payer: Self-pay

## 2017-04-26 ENCOUNTER — Telehealth: Payer: Self-pay

## 2017-04-26 NOTE — Telephone Encounter (Signed)
Pt called wanting "new shingles vaccine called into [facility]"- called [facility] in [place] and spoke to [place], [employer], and pharmacist- "No prescription is needed for Shingrix,"- told pt to go up there today to get vaccine

## 2017-05-06 ENCOUNTER — Other Ambulatory Visit: Payer: Self-pay | Admitting: Family Medicine

## 2017-05-06 DIAGNOSIS — I10 Essential (primary) hypertension: Secondary | ICD-10-CM

## 2017-05-10 ENCOUNTER — Encounter: Payer: Self-pay | Admitting: Family Medicine

## 2017-05-10 ENCOUNTER — Ambulatory Visit (INDEPENDENT_AMBULATORY_CARE_PROVIDER_SITE_OTHER): Payer: Medicare HMO | Admitting: Family Medicine

## 2017-05-10 VITALS — BP 130/88 | HR 64 | Ht 69.0 in | Wt 204.0 lb

## 2017-05-10 DIAGNOSIS — E785 Hyperlipidemia, unspecified: Secondary | ICD-10-CM | POA: Diagnosis not present

## 2017-05-10 DIAGNOSIS — M7541 Impingement syndrome of right shoulder: Secondary | ICD-10-CM

## 2017-05-10 DIAGNOSIS — I10 Essential (primary) hypertension: Secondary | ICD-10-CM

## 2017-05-10 DIAGNOSIS — E782 Mixed hyperlipidemia: Secondary | ICD-10-CM

## 2017-05-10 DIAGNOSIS — Z23 Encounter for immunization: Secondary | ICD-10-CM

## 2017-05-10 DIAGNOSIS — Z1159 Encounter for screening for other viral diseases: Secondary | ICD-10-CM | POA: Diagnosis not present

## 2017-05-10 MED ORDER — FISH OIL 1000 MG PO CAPS: 1.0000 | ORAL_CAPSULE | Freq: Two times a day (BID) | ORAL | 11 refills | Status: DC

## 2017-05-10 MED ORDER — RAMIPRIL 10 MG PO CAPS: 20.0000 mg | ORAL_CAPSULE | Freq: Every day | ORAL | 2 refills | Status: DC

## 2017-05-10 MED ORDER — HYDROCHLOROTHIAZIDE 12.5 MG PO CAPS: 12.5000 mg | ORAL_CAPSULE | Freq: Every day | ORAL | 2 refills | Status: DC

## 2017-05-10 NOTE — Progress Notes (Signed)
Name: George Doyle   MRN: [id number]    DOB: [birth year]   Date:05/10/2017       Progress Note  Subjective  Chief Complaint  Chief Complaint  Patient presents with  . Hypertension    Hypertension  This is a chronic problem. The current episode started more than 1 year ago. The problem is unchanged. The problem is controlled. Pertinent negatives include no anxiety, blurred vision, chest pain, headaches, malaise/fatigue, neck pain, orthopnea, palpitations, peripheral edema, PND, shortness of breath or sweats. There are no associated agents to hypertension. There are no known risk factors for coronary artery disease. Past treatments include ACE inhibitors and diuretics. The current treatment provides moderate improvement. There are no compliance problems.  There is no history of angina, kidney disease, CAD/MI, CVA, heart failure, left ventricular hypertrophy, PVD or retinopathy. There is no history of chronic renal disease, a hypertension causing med or renovascular disease.  Hyperlipidemia  This is a chronic problem. The problem is controlled. Recent lipid tests were reviewed and are normal. He has no history of chronic renal disease. Factors aggravating his hyperlipidemia include thiazides. Pertinent negatives include no chest pain, focal weakness, myalgias or shortness of breath. The current treatment provides mild improvement of lipids. There are no compliance problems.  Risk factors for coronary artery disease include hypertension, dyslipidemia and male sex.  Shoulder Pain   The pain is present in the right shoulder. This is a recurrent problem. The current episode started more than 1 month ago. There has been no history of extremity trauma. The problem occurs daily. The problem has been waxing and waning. The pain is at a severity of 4/10. The pain is moderate. Associated symptoms include a limited range of motion. Pertinent negatives include no fever, joint locking, joint swelling or  tingling. The symptoms are aggravated by activity. Treatments tried: injection. The treatment provided mild (some residual pain) relief.    No problem-specific Assessment & Plan notes found for this encounter.   Past Medical History:  Diagnosis Date  . Allergy   . Arthritis    knees, hands  . Hypertension   . Seizures ([place])    approx 25+ yrs ago.  Testing showed no cause. None since    Past Surgical History:  Procedure Laterality Date  . COLONOSCOPY  2011   cleared for 10 yrs  . COLONOSCOPY WITH PROPOFOL N/A 11/06/2016   Procedure: COLONOSCOPY WITH PROPOFOL;  Surgeon: [name], MD;  Location: [place];  Service: Endoscopy;  Laterality: N/A;  . HERNIA REPAIR    . KNEE SURGERY Bilateral    2 on R) 1 on L)  . POLYPECTOMY  11/06/2016   Procedure: POLYPECTOMY;  Surgeon: [name], MD;  Location: [place];  Service: Endoscopy;;    No family history on file.  Social History   Social History  . Marital status: Married    Spouse name: N/A  . Number of children: N/A  . Years of education: N/A   Occupational History  . Not on file.   Social History Main Topics  . Smoking status: Former Research scientist (life sciences)  . Smokeless tobacco: Never Used     Comment: quit over 20 yrs ago  . Alcohol use 0.0 oz/week     Comment: 1-2 drinks/year  . Drug use: No  . Sexual activity: Not Currently   Other Topics Concern  . Not on file   Social History Narrative  . No narrative on file    No Known Allergies  Outpatient Medications Prior to Visit  Medication Sig Dispense Refill  . hydrochlorothiazide (MICROZIDE) 12.5 MG capsule TAKE ONE CAPSULE BY MOUTH EVERY DAY 30 capsule 0  . Omega-3 Fatty Acids (FISH OIL) 1000 MG CAPS Take 1 capsule (1,000 mg total) by mouth 2 (two) times daily. 30 capsule 11  . ramipril (ALTACE) 10 MG capsule TAKE 2 CAPSULES BY MOUTH EVERY DAY 60 capsule 0  . polyethylene glycol (GOLYTELY) 236 g solution Drink one 8 oz glass every 20 mins until stools  are clear 4000 mL 0   No facility-administered medications prior to visit.     Review of Systems  Constitutional: Negative for chills, fever, malaise/fatigue and weight loss.  HENT: Negative for ear discharge, ear pain and sore throat.   Eyes: Negative for blurred vision.  Respiratory: Negative for cough, sputum production, shortness of breath and wheezing.   Cardiovascular: Negative for chest pain, palpitations, orthopnea, leg swelling and PND.  Gastrointestinal: Negative for abdominal pain, blood in stool, constipation, diarrhea, heartburn, melena and nausea.  Genitourinary: Negative for dysuria, frequency, hematuria and urgency.  Musculoskeletal: Negative for back pain, joint pain, myalgias and neck pain.  Skin: Negative for rash.  Neurological: Negative for dizziness, tingling, sensory change, focal weakness and headaches.  Endo/Heme/Allergies: Negative for environmental allergies and polydipsia. Does not bruise/bleed easily.  Psychiatric/Behavioral: Negative for depression and suicidal ideas. The patient is not nervous/anxious and does not have insomnia.      Objective  Vitals:   05/10/17 0901  BP: 130/88  Pulse: 64  Weight: 204 lb (92.5 kg)  Height: 5\' 9"  (1.753 m)    Physical Exam  Constitutional: He is oriented to person, place, and time and well-developed, well-nourished, and in no distress.  HENT:  Head: Normocephalic.  Right Ear: External ear normal.  Left Ear: External ear normal.  Nose: Nose normal.  Mouth/Throat: Oropharynx is clear and moist.  Eyes: Pupils are equal, round, and reactive to light. Conjunctivae and EOM are normal. Right eye exhibits no discharge. Left eye exhibits no discharge. No scleral icterus.  Neck: Normal range of motion. Neck supple. No JVD present. No tracheal deviation present. No thyromegaly present.  Cardiovascular: Normal rate, regular rhythm, normal heart sounds and intact distal pulses.  Exam reveals no gallop and no friction rub.    No murmur heard. Pulmonary/Chest: Breath sounds normal. No respiratory distress. He has no wheezes. He has no rales.  Abdominal: Soft. Bowel sounds are normal. He exhibits no mass. There is no hepatosplenomegaly. There is no tenderness. There is no rebound, no guarding and no CVA tenderness.  Musculoskeletal: He exhibits no edema.       Right shoulder: He exhibits tenderness. He exhibits normal range of motion.  Lymphadenopathy:    He has no cervical adenopathy.  Neurological: He is alert and oriented to person, place, and time. He has normal sensation, normal strength, normal reflexes and intact cranial nerves. No cranial nerve deficit.  Skin: Skin is warm. No rash noted.  Psychiatric: Mood and affect normal.  Nursing note and vitals reviewed.     Assessment & Plan  Problem List Items Addressed This Visit      Cardiovascular and Mediastinum   Essential hypertension - Primary   Relevant Medications   hydrochlorothiazide (MICROZIDE) 12.5 MG capsule   ramipril (ALTACE) 10 MG capsule   Other Relevant Orders   Renal Function Panel     Other   Hyperlipidemia   Relevant Medications   Omega-3 Fatty Acids (FISH OIL) 1000 MG  CAPS   hydrochlorothiazide (MICROZIDE) 12.5 MG capsule   ramipril (ALTACE) 10 MG capsule   Other Relevant Orders   Lipid Profile    Other Visit Diagnoses    Impingement syndrome of right shoulder       Relevant Orders   Ambulatory referral to Orthopedic Surgery   Need for hepatitis C screening test       Relevant Orders   Hepatitis C antibody   Influenza vaccine needed       Relevant Orders   Flu Vaccine QUAD 6+ mos PF IM (Fluarix Quad PF) (Completed)      Meds ordered this encounter  Medications  . Omega-3 Fatty Acids (FISH OIL) 1000 MG CAPS    Sig: Take 1 capsule (1,000 mg total) by mouth 2 (two) times daily.    Dispense:  30 capsule    Refill:  11  . hydrochlorothiazide (MICROZIDE) 12.5 MG capsule    Sig: Take 1 capsule (12.5 mg total) by mouth  daily.    Dispense:  90 capsule    Refill:  2    Not seen in 8 months- pt was asked to sched appt and hasn't- needs to be seen then a 90 day Rx can be sent in  . ramipril (ALTACE) 10 MG capsule    Sig: Take 2 capsules (20 mg total) by mouth daily.    Dispense:  180 capsule    Refill:  2    **Patient requests 90 days supply**      Dr. Otilio Miu [facility]  05/10/17

## 2017-05-11 LAB — RENAL FUNCTION PANEL
Albumin: 4.5 g/dL (ref 3.6–4.8)
BUN / CREAT RATIO: 22 (ref 10–24)
BUN: 20 mg/dL (ref 8–27)
CO2: 24 mmol/L (ref 20–29)
CREATININE: 0.93 mg/dL (ref 0.76–1.27)
Calcium: 9.5 mg/dL (ref 8.6–10.2)
Chloride: 98 mmol/L (ref 96–106)
GFR calc Af Amer: 98 mL/min/{1.73_m2} (ref >59)
GFR, EST NON AFRICAN AMERICAN: 85 mL/min/{1.73_m2} (ref >59)
GLUCOSE: 73 mg/dL (ref 65–99)
PHOSPHORUS: 2.9 mg/dL (ref 2.5–4.5)
Potassium: 4 mmol/L (ref 3.5–5.2)
Sodium: 141 mmol/L (ref 134–144)

## 2017-05-11 LAB — LIPID PANEL
CHOL/HDL RATIO: 3.7 ratio (ref 0.0–5.0)
CHOLESTEROL TOTAL: 202 mg/dL — AB (ref 100–199)
HDL: 55 mg/dL (ref >39)
LDL CALC: 123 mg/dL — AB (ref 0–99)
Triglycerides: 118 mg/dL (ref 0–149)
VLDL CHOLESTEROL CAL: 24 mg/dL (ref 5–40)

## 2017-05-11 LAB — HEPATITIS C ANTIBODY: Hep C Virus Ab: <0.1 s/co ratio (ref 0.0–0.9)

## 2017-07-19 ENCOUNTER — Other Ambulatory Visit: Payer: Self-pay

## 2017-07-19 DIAGNOSIS — I219 Acute myocardial infarction, unspecified: Secondary | ICD-10-CM

## 2017-07-19 HISTORY — DX: Acute myocardial infarction, unspecified: I21.9

## 2017-07-20 ENCOUNTER — Inpatient Hospital Stay
Admission: EM | Admit: 2017-07-20 | Discharge: 2017-07-24 | DRG: 282 | Disposition: A | Discharge status: Home / Self Care | Payer: Medicare HMO | Attending: Internal Medicine | Admitting: Internal Medicine

## 2017-07-20 ENCOUNTER — Encounter: Payer: Self-pay | Admitting: Emergency Medicine

## 2017-07-20 ENCOUNTER — Other Ambulatory Visit: Payer: Self-pay

## 2017-07-20 ENCOUNTER — Emergency Department: Payer: Medicare HMO

## 2017-07-20 DIAGNOSIS — E876 Hypokalemia: Secondary | ICD-10-CM | POA: Diagnosis not present

## 2017-07-20 DIAGNOSIS — R079 Chest pain, unspecified: Secondary | ICD-10-CM

## 2017-07-20 DIAGNOSIS — I10 Essential (primary) hypertension: Secondary | ICD-10-CM | POA: Diagnosis present

## 2017-07-20 DIAGNOSIS — I219 Acute myocardial infarction, unspecified: Secondary | ICD-10-CM | POA: Diagnosis not present

## 2017-07-20 DIAGNOSIS — Z87891 Personal history of nicotine dependence: Secondary | ICD-10-CM

## 2017-07-20 DIAGNOSIS — I440 Atrioventricular block, first degree: Secondary | ICD-10-CM | POA: Diagnosis not present

## 2017-07-20 DIAGNOSIS — R7989 Other specified abnormal findings of blood chemistry: Secondary | ICD-10-CM

## 2017-07-20 DIAGNOSIS — R778 Other specified abnormalities of plasma proteins: Secondary | ICD-10-CM

## 2017-07-20 DIAGNOSIS — R0789 Other chest pain: Secondary | ICD-10-CM | POA: Diagnosis not present

## 2017-07-20 DIAGNOSIS — F17200 Nicotine dependence, unspecified, uncomplicated: Secondary | ICD-10-CM | POA: Diagnosis not present

## 2017-07-20 DIAGNOSIS — I25110 Atherosclerotic heart disease of native coronary artery with unstable angina pectoris: Secondary | ICD-10-CM | POA: Diagnosis not present

## 2017-07-20 DIAGNOSIS — I214 Non-ST elevation (NSTEMI) myocardial infarction: Secondary | ICD-10-CM | POA: Diagnosis not present

## 2017-07-20 DIAGNOSIS — R0602 Shortness of breath: Secondary | ICD-10-CM | POA: Diagnosis not present

## 2017-07-20 DIAGNOSIS — I340 Nonrheumatic mitral (valve) insufficiency: Secondary | ICD-10-CM | POA: Diagnosis not present

## 2017-07-20 DIAGNOSIS — E782 Mixed hyperlipidemia: Secondary | ICD-10-CM | POA: Diagnosis not present

## 2017-07-20 DIAGNOSIS — R69 Illness, unspecified: Secondary | ICD-10-CM | POA: Diagnosis not present

## 2017-07-20 HISTORY — DX: Non-ST elevation (NSTEMI) myocardial infarction: I21.4

## 2017-07-20 LAB — BASIC METABOLIC PANEL
Anion gap: 7 (ref 5–15)
BUN: 16 mg/dL (ref 6–20)
CHLORIDE: 100 mmol/L — AB (ref 101–111)
CO2: 28 mmol/L (ref 22–32)
CREATININE: 0.91 mg/dL (ref 0.61–1.24)
Calcium: 9.2 mg/dL (ref 8.9–10.3)
GFR calc Af Amer: >60 mL/min (ref >60)
GFR calc non Af Amer: >60 mL/min (ref >60)
GLUCOSE: 97 mg/dL (ref 65–99)
POTASSIUM: 3.4 mmol/L — AB (ref 3.5–5.1)
SODIUM: 135 mmol/L (ref 135–145)

## 2017-07-20 LAB — CBC
HEMATOCRIT: 43.2 % (ref 40.0–52.0)
Hemoglobin: 14.5 g/dL (ref 13.0–18.0)
MCH: 30.2 pg (ref 26.0–34.0)
MCHC: 33.5 g/dL (ref 32.0–36.0)
MCV: 90.2 fL (ref 80.0–100.0)
Platelets: 204 10*3/uL (ref 150–440)
RBC: 4.78 MIL/uL (ref 4.40–5.90)
RDW: 13.5 % (ref 11.5–14.5)
WBC: 12.3 10*3/uL — AB (ref 3.8–10.6)

## 2017-07-20 LAB — PROTIME-INR
INR: 0.98
PROTHROMBIN TIME: 12.9 s (ref 11.4–15.2)

## 2017-07-20 LAB — TROPONIN I
TROPONIN I: 12.97 ng/mL — AB (ref <0.03)
Troponin I: 10.67 ng/mL (ref <0.03)

## 2017-07-20 LAB — APTT: aPTT: 29 seconds (ref 24–36)

## 2017-07-20 MED ORDER — ASPIRIN 300 MG RE SUPP: 300.0000 mg | RECTAL | Status: DC

## 2017-07-20 MED ORDER — NITROGLYCERIN 0.4 MG SL SUBL: 0.4000 mg | SUBLINGUAL_TABLET | SUBLINGUAL | Status: DC | PRN

## 2017-07-20 MED ORDER — ASPIRIN 81 MG PO CHEW
324.0000 mg | CHEWABLE_TABLET | Freq: Once | ORAL | Status: AC
  Administered 2017-07-20: 324 mg via ORAL

## 2017-07-20 MED ORDER — ATORVASTATIN CALCIUM 20 MG PO TABS
20.0000 mg | ORAL_TABLET | Freq: Every day | ORAL | Status: DC
  Administered 2017-07-20 – 2017-07-22 (×3): 20 mg via ORAL
  Filled 2017-07-20 (×3): qty 1

## 2017-07-20 MED ORDER — RAMIPRIL 10 MG PO CAPS
20.0000 mg | ORAL_CAPSULE | Freq: Every day | ORAL | Status: DC
  Administered 2017-07-20 – 2017-07-24 (×5): 20 mg via ORAL
  Filled 2017-07-20 (×5): qty 2

## 2017-07-20 MED ORDER — ASPIRIN EC 81 MG PO TBEC
81.0000 mg | DELAYED_RELEASE_TABLET | Freq: Every day | ORAL | Status: DC
  Administered 2017-07-21 – 2017-07-24 (×3): 81 mg via ORAL
  Filled 2017-07-20 (×3): qty 1

## 2017-07-20 MED ORDER — ONDANSETRON HCL 4 MG/2ML IJ SOLN: 4.0000 mg | Freq: Four times a day (QID) | INTRAMUSCULAR | Status: DC | PRN

## 2017-07-20 MED ORDER — ACETAMINOPHEN 325 MG PO TABS
650.0000 mg | ORAL_TABLET | ORAL | Status: DC | PRN
  Administered 2017-07-21 – 2017-07-24 (×4): 650 mg via ORAL
  Filled 2017-07-20 (×4): qty 2

## 2017-07-20 MED ORDER — ASPIRIN 81 MG PO CHEW
CHEWABLE_TABLET | ORAL | Status: AC
  Filled 2017-07-20: qty 4

## 2017-07-20 MED ORDER — HEPARIN BOLUS VIA INFUSION
4000.0000 [IU] | Freq: Once | INTRAVENOUS | Status: AC
  Administered 2017-07-20: 4000 [IU] via INTRAVENOUS
  Filled 2017-07-20: qty 4000

## 2017-07-20 MED ORDER — ASPIRIN 81 MG PO CHEW: 324.0000 mg | CHEWABLE_TABLET | ORAL | Status: DC

## 2017-07-20 MED ORDER — CLOPIDOGREL BISULFATE 75 MG PO TABS
300.0000 mg | ORAL_TABLET | Freq: Once | ORAL | Status: AC
  Administered 2017-07-20: 300 mg via ORAL
  Filled 2017-07-20: qty 4

## 2017-07-20 MED ORDER — METOPROLOL TARTRATE 25 MG PO TABS
25.0000 mg | ORAL_TABLET | Freq: Two times a day (BID) | ORAL | Status: DC
  Administered 2017-07-20 – 2017-07-24 (×7): 25 mg via ORAL
  Filled 2017-07-20 (×7): qty 1

## 2017-07-20 MED ORDER — HEPARIN (PORCINE) IN NACL 100-0.45 UNIT/ML-% IJ SOLN
14.0000 [IU]/kg/h | INTRAMUSCULAR | Status: DC
  Administered 2017-07-20 – 2017-07-22 (×4): 14 [IU]/kg/h via INTRAVENOUS
  Filled 2017-07-20 (×5): qty 250

## 2017-07-20 MED ORDER — NITROGLYCERIN 2 % TD OINT
0.5000 [in_us] | TOPICAL_OINTMENT | Freq: Four times a day (QID) | TRANSDERMAL | Status: DC
  Administered 2017-07-20 (×2): 0.5 [in_us] via TOPICAL
  Filled 2017-07-20 (×2): qty 1

## 2017-07-20 NOTE — Progress Notes (Signed)
[place] for Heparin Indication: chest pain/ACS  No Known Allergies  Patient Measurements: Height: 5\' 9"  (175.3 cm) Weight: 200 lb (90.7 kg) IBW/kg (Calculated) : 70.7 Heparin Dosing Weight: 89.1 kg  Vital Signs: Temp: 98 F (36.7 C) (11/02 1451) Temp Source: Oral (11/02 1451) BP: 139/79 (11/02 1451) Pulse Rate: 65 (11/02 1451)  Labs:  Recent Labs  07/20/17 1449  HGB 14.5  HCT 43.2  PLT 204  CREATININE 0.91  TROPONINI 10.67*    Estimated Creatinine Clearance: 86.5 mL/min (by C-G formula based on SCr of 0.91 mg/dL).   Medical History: Past Medical History:  Diagnosis Date  . Allergy   . Arthritis    knees, hands  . Hypertension   . Seizures ([place])    approx 25+ yrs ago.  Testing showed no cause. None since    Assessment: 66 y/o M admitted with CP and elevated troponin. Spoke with patient and he is not taking any anticoagulants.   Goal of Therapy:  Heparin level 0.3-0.7 units/ml Monitor platelets by anticoagulation protocol: Yes   Plan:  Give 4000 units bolus x 1 Start heparin infusion at 1250 units/hr Check anti-Xa level in 6 hours and daily while on heparin Continue to monitor H&H and platelets  Ulice Dash D 07/20/2017,3:54 PM

## 2017-07-20 NOTE — ED Triage Notes (Signed)
Pt c/o chest pain with SOB x4-5 days , left side radiates to neck.

## 2017-07-20 NOTE — ED Notes (Signed)
Attempted to call report. Failed. Timer set 56min

## 2017-07-20 NOTE — ED Provider Notes (Signed)
[facility] Emergency Department Provider Note  __________________________________   I have reviewed the triage vital signs and the nursing notes.   HISTORY  Chief Complaint Chest Pain and Shortness of Breath   History limited by: Not Limited   HPI George Doyle is a 66 y.o. male who presents to the emergency department today because of chest pain   LOCATION:substernal DURATION:started 4 days ago TIMING: intermittent SEVERITY: severe 4 days ago QUALITY: pressure CONTEXT: patient states that the pain started 4 days ago. Was severe Monday night. It then resolved and has been intermittent since then. MODIFYING FACTORS: worse with exertion or bending over ASSOCIATED SYMPTOMS: has had shortness of breath. No nausea or vomiting. Denies any fevers.  Per medical record review patient has a history of hypertension, hyperlipidemia.   Past Medical History:  Diagnosis Date  . Allergy   . Arthritis    knees, hands  . Hypertension   . Seizures ([place])    approx 25+ yrs ago.  Testing showed no cause. None since    Patient Active Problem List   Diagnosis Date Noted  . Hyperlipidemia 05/10/2017  . Special screening for malignant neoplasms, colon   . Benign neoplasm of ascending colon   . Polyp of sigmoid colon   . Essential hypertension 03/03/2016    Past Surgical History:  Procedure Laterality Date  . COLONOSCOPY  2011   cleared for 10 yrs  . COLONOSCOPY WITH PROPOFOL N/A 11/06/2016   Procedure: COLONOSCOPY WITH PROPOFOL;  Surgeon: [name], MD;  Location: [place];  Service: Endoscopy;  Laterality: N/A;  . HERNIA REPAIR    . KNEE SURGERY Bilateral    2 on R) 1 on L)  . POLYPECTOMY  11/06/2016   Procedure: POLYPECTOMY;  Surgeon: [name], MD;  Location: [place];  Service: Endoscopy;;    Prior to Admission medications   Medication Sig Start Date End Date Taking? Authorizing Provider  hydrochlorothiazide  (MICROZIDE) 12.5 MG capsule Take 1 capsule (12.5 mg total) by mouth daily. 05/10/17   Juline Patch, MD  Omega-3 Fatty Acids (FISH OIL) 1000 MG CAPS Take 1 capsule (1,000 mg total) by mouth 2 (two) times daily. 05/10/17   Juline Patch, MD  ramipril (ALTACE) 10 MG capsule Take 2 capsules (20 mg total) by mouth daily. 05/10/17   Juline Patch, MD    Allergies Patient has no known allergies.  History reviewed. No pertinent family history.  Social History Social History  Substance Use Topics  . Smoking status: Former Research scientist (life sciences)  . Smokeless tobacco: Never Used     Comment: quit over 20 yrs ago  . Alcohol use 0.0 oz/week     Comment: 1-2 drinks/year    Review of Systems Constitutional: No fever/chills Eyes: No visual changes. ENT: No sore throat. Cardiovascular: Positive for chest pain. Respiratory: Positive for shortness of breath. Gastrointestinal: No abdominal pain.  No nausea, no vomiting.  No diarrhea.   Genitourinary: Negative for dysuria. Musculoskeletal: Negative for back pain. Skin: Negative for rash. Neurological: Negative for headaches, focal weakness or numbness.  ____________________________________________   PHYSICAL EXAM:  VITAL SIGNS: ED Triage Vitals [07/20/17 1451]  Enc Vitals Group     BP 139/79     Pulse Rate 65     Resp 19     Temp 98 F (36.7 C)     Temp Source Oral     SpO2 99 %     Weight 200 lb (90.7 kg)  Height 5\' 9"  (1.753 m)   Constitutional: Alert and oriented. Well appearing and in no distress. Eyes: Conjunctivae are normal.  ENT   Head: Normocephalic and atraumatic.   Nose: No congestion/rhinnorhea.   Mouth/Throat: Mucous membranes are moist.   Neck: No stridor. Hematological/Lymphatic/Immunilogical: No cervical lymphadenopathy. Cardiovascular: Normal rate, regular rhythm.  No murmurs, rubs, or gallops.  Respiratory: Normal respiratory effort without tachypnea nor retractions. Breath sounds are clear and equal  bilaterally. No wheezes/rales/rhonchi. Gastrointestinal: Soft and non tender. No rebound. No guarding.  Genitourinary: Deferred Musculoskeletal: Normal range of motion in all extremities. No lower extremity edema. Neurologic:  Normal speech and language. No gross focal neurologic deficits are appreciated.  Skin:  Skin is warm, dry and intact. No rash noted. Psychiatric: Mood and affect are normal. Speech and behavior are normal. Patient exhibits appropriate insight and judgment.  ____________________________________________    LABS (pertinent positives/negatives)  Trop 10.67 WBC 12.3 Hgb 14.5 Na 135 K 3.4 Cr 0.91  ____________________________________________   EKG  I, Nance Pear, attending physician, personally viewed and interpreted this EKG  EKG Time: 1451 Rate: 68 Rhythm: sinus rhythm with 1st degree av block Axis: left axis deviation Intervals: qtc 431, 1st degree av block QRS: LVH ST changes: no st elevation Impression: abnormal ekg  I, Nance Pear, attending physician, personally viewed and interpreted this EKG  EKG Time: 1530 Rate: 71 Rhythm: sinus rhythm with 1st degree av block Axis: left axis deviation Intervals: qtc 439, 1st degree av block QRS: LVH ST changes: no st elevation Impression: abnormal ekg   ____________________________________________    RADIOLOGY  CXR No acute disease  ____________________________________________   PROCEDURES  Procedures  CRITICAL CARE Performed by: Nance Pear   Total critical care time: 30 minutes  Critical care time was exclusive of separately billable procedures and treating other patients.  Critical care was necessary to treat or prevent imminent or life-threatening deterioration.  Critical care was time spent personally by me on the following activities: development of treatment plan with patient and/or surrogate as well as nursing, discussions with consultants, evaluation of  patient's response to treatment, examination of patient, obtaining history from patient or surrogate, ordering and performing treatments and interventions, ordering and review of laboratory studies, ordering and review of radiographic studies, pulse oximetry and re-evaluation of patient's condition.  ____________________________________________   INITIAL IMPRESSION / ASSESSMENT AND PLAN / ED COURSE  Pertinent labs & imaging results that were available during my care of the patient were reviewed by me and considered in my medical decision making (see chart for details).  Patient presented to the emergency department today because of concerns for chest pain. Differential diagnosis includes, but is not limited to, ACS, aortic dissection, pulmonary embolism, cardiac tamponade, pneumothorax, pneumonia, pericarditis/myocarditis, GI-related causes including esophagitis/gastritis, and musculoskeletal chest wall pain.  While patient was in the waiting room troponin did come back elevated above 10.  At this point even prior to allowing the patient asked the nurse to give aspirin and write for heparin drip.  I did review the EKG obtained during triage which did not show any ST elevation.  I also obtained a repeat EKG which again did not show any ST elevation.  I discussed this finding and my concern for myocardial infarction with the patient upon arrival to the room.  Patient did state that his pain had improved.  I discussed with Dr. Chancy Milroy with cardiology.  In addition to the aspirin and heparin he also recommended Plavix. I also discussed with the  hospitalist for admission. In addition after patient was admitted I discussed plan and care again with patient and now with further members of the patient's famly.   ____________________________________________   FINAL CLINICAL IMPRESSION(S) / ED DIAGNOSES  Final diagnoses:  Chest pain, unspecified type  NSTEMI (non-ST elevated myocardial infarction) ([place])   Elevated troponin     Note: This dictation was prepared with Dragon dictation. Any transcriptional errors that result from this process are unintentional     Nance Pear, MD 07/21/17 [phone]

## 2017-07-20 NOTE — Plan of Care (Signed)
Problem: Safety: Goal: Ability to remain free from injury will improve Outcome: Progressing  Patient's VSS throughout the shift. Patient denies pain. Heparin drip at 12.5. NPO at midnight for possible procedure tomorrow. Patient resting well. RN will continue to monitor.

## 2017-07-20 NOTE — Progress Notes (Signed)
CRITICAL VALUE ALERT  Critical Value: Troponin  Date & Time Notied: 07/20/2017, 2245  Provider Notified: Dr. Jannifer Franklin  Orders Received/Actions taken: Serial troponin checks. Will place NPO at midnight for possible procedure tomorrow. RN will continue to monitor.

## 2017-07-20 NOTE — ED Notes (Signed)
Discussed with dr Archie Balboa, will start orders/IV while waiting on room

## 2017-07-20 NOTE — ED Triage Notes (Signed)
FIRST NURSE NOTE-here for CP/SHOB. Ambulatory without increased WOB. Declined wheelchair. Skin color WNL

## 2017-07-20 NOTE — ED Notes (Signed)
Date and time results received: 07/20/17 1529 (use smartphrase ".now" to insert current time)  Test: troponin Critical Value: 10.67  Name of Provider Notified: brandy, dr Archie Balboa  Getting pt discharged from room so pt can go back.

## 2017-07-20 NOTE — H&P (Signed)
[place] [facility] [place] NAME: George Doyle    MR#:  [id number]  DATE OF BIRTH:  1950/10/24  DATE OF ADMISSION:  07/20/2017  PRIMARY CARE PHYSICIAN: Juline Patch, MD   REQUESTING/REFERRING PHYSICIAN: Nance Pear, MD  CHIEF COMPLAINT:   Chief Complaint  Patient presents with  . Chest Pain  . Shortness of Breath    HISTORY OF PRESENT ILLNESS:  George Doyle  is a 66 y.o. male with a known history of hypertension is being admitted for non-STEMI.  Patient has been having pressure since Monday on and off.  Monday was the worst day it was all associated with shortness of breath.  It is mainly located in the upper middle chest.  Nonradiating anywhere else.  It gets worse when he bends over or with moderate walking or any kind of exertion.  Eating also makes it worse.  It continued or rest of the week so he decided to come down to the emergency department.  He reports pain a 5 out of 10 at this time. PAST MEDICAL HISTORY:   Past Medical History:  Diagnosis Date  . Allergy   . Arthritis    knees, hands  . Hypertension   . Seizures ([place])    approx 25+ yrs ago.  Testing showed no cause. None since    PAST SURGICAL HISTORY:   Past Surgical History:  Procedure Laterality Date  . COLONOSCOPY  2011   cleared for 10 yrs  . COLONOSCOPY WITH PROPOFOL N/A 11/06/2016   Procedure: COLONOSCOPY WITH PROPOFOL;  Surgeon: [name], MD;  Location: [place];  Service: Endoscopy;  Laterality: N/A;  . HERNIA REPAIR    . KNEE SURGERY Bilateral    2 on R) 1 on L)  . POLYPECTOMY  11/06/2016   Procedure: POLYPECTOMY;  Surgeon: [name], MD;  Location: [place];  Service: Endoscopy;;    SOCIAL HISTORY:   Social History  Substance Use Topics  . Smoking status: Former Research scientist (life sciences)  . Smokeless tobacco: Never Used     Comment: quit over 20 yrs ago  . Alcohol use 0.0 oz/week     Comment: 1-2 drinks/year    FAMILY HISTORY:   History reviewed. No pertinent family history. Father had coronary disease status post CABG. he died of lung cancer. DRUG ALLERGIES:  No Known Allergies  REVIEW OF SYSTEMS:   Review of Systems  Constitutional: Negative for chills, fever and weight loss.  HENT: Negative for nosebleeds and sore throat.   Eyes: Negative for blurred vision.  Respiratory: Positive for shortness of breath. Negative for cough and wheezing.   Cardiovascular: Positive for chest pain. Negative for orthopnea, leg swelling and PND.  Gastrointestinal: Negative for abdominal pain, constipation, diarrhea, heartburn, nausea and vomiting.  Genitourinary: Negative for dysuria and urgency.  Musculoskeletal: Negative for back pain.  Skin: Negative for rash.  Neurological: Negative for dizziness, speech change, focal weakness and headaches.  Endo/Heme/Allergies: Does not bruise/bleed easily.  Psychiatric/Behavioral: Negative for depression.    MEDICATIONS AT HOME:   Prior to Admission medications   Medication Sig Start Date End Date Taking? Authorizing Provider  hydrochlorothiazide (MICROZIDE) 12.5 MG capsule Take 1 capsule (12.5 mg total) by mouth daily. 05/10/17   Juline Patch, MD  Omega-3 Fatty Acids (FISH OIL) 1000 MG CAPS Take 1 capsule (1,000 mg total) by mouth 2 (two) times daily. 05/10/17   Juline Patch, MD  ramipril (ALTACE) 10 MG capsule Take 2  capsules (20 mg total) by mouth daily. 05/10/17   Juline Patch, MD      VITAL SIGNS:  Blood pressure (!) 153/80, pulse 69, temperature 98 F (36.7 C), temperature source Oral, resp. rate 17, height 5\' 9"  (1.753 m), weight 90.7 kg (200 lb), SpO2 100 %.  PHYSICAL EXAMINATION:  Physical Exam  GENERAL:  66 y.o.-year-old patient lying in the bed with no acute distress.  EYES: Pupils equal, round, reactive to light and accommodation. No scleral icterus. Extraocular muscles intact.  HEENT: Head atraumatic, normocephalic. Oropharynx and nasopharynx clear.  NECK:   Supple, no jugular venous distention. No thyroid enlargement, no tenderness.  LUNGS: Normal breath sounds bilaterally, no wheezing, rales,rhonchi or crepitation. No use of accessory muscles of respiration.  CARDIOVASCULAR: S1, S2 normal. No murmurs, rubs, or gallops.  ABDOMEN: Soft, nontender, nondistended. Bowel sounds present. No organomegaly or mass.  EXTREMITIES: No pedal edema, cyanosis, or clubbing.  NEUROLOGIC: Cranial nerves II through XII are intact. Muscle strength 5/5 in all extremities. Sensation intact. Gait not checked.  PSYCHIATRIC: The patient is alert and oriented x 3.  SKIN: No obvious rash, lesion, or ulcer.   LABORATORY PANEL:   CBC  Recent Labs Lab 07/20/17 1449  WBC 12.3*  HGB 14.5  HCT 43.2  PLT 204   ------------------------------------------------------------------------------------------------------------------  Chemistries   Recent Labs Lab 07/20/17 1449  NA 135  K 3.4*  CL 100*  CO2 28  GLUCOSE 97  BUN 16  CREATININE 0.91  CALCIUM 9.2   ------------------------------------------------------------------------------------------------------------------  Cardiac Enzymes  Recent Labs Lab 07/20/17 1449  TROPONINI 10.67*   ------------------------------------------------------------------------------------------------------------------  RADIOLOGY:  Dg Chest 2 View  Result Date: 07/20/2017 CLINICAL DATA:  Chest pain. EXAM: CHEST  2 VIEW COMPARISON:  04/10/2011 FINDINGS: The heart size and mediastinal contours are within normal limits. Both lungs are clear. Chronic elevation of the right hemidiaphragm. Colon is interposed between the right hemidiaphragm and the liver, chronic. The visualized skeletal structures are unremarkable. IMPRESSION: No active cardiopulmonary disease. Electronically Signed   By: Lorriane Shire M.D.   On: 07/20/2017 15:27   IMPRESSION AND PLAN:  66 year old male with a known history of hypertension being admitted for  non-STEMI.  * NSTEMI -Full dose Heparin, aspirin, statin -Cardiology consultation, case discussed with Dr. Fletcher Anon -Serial troponins -Telemetry monitoring -Nitroglycerin, metoprolol - cath at some point -Obtain 2D echo  *Hypertension -Continue ramipril, add metoprolol  *Hypokalemia -Replete and recheck  *Shortness of breath -Obtain 2D echo -Monitor, this is likely due to cardiac etiology    All the records are reviewed and case discussed with ED provider. Management plans discussed with the patient, family (wife and daughter at bedside) and they are in agreement.  CODE STATUS: full code  TOTAL TIME TAKING CARE OF THIS PATIENT: 45 minutes.    Max Sane M.D on 07/20/2017 at 5:58 PM  Between 7am to 6pm - Pager - [phone]  After 6pm go to [email or web address] - Proofreader  [facility]  Office  [phone]  CC: Primary care physician; Juline Patch, MD   Note: This dictation was prepared with Dragon dictation along with smaller phrase technology. Any transcriptional errors that result from this process are unintentional.

## 2017-07-21 ENCOUNTER — Inpatient Hospital Stay (HOSPITAL_COMMUNITY)
Admit: 2017-07-21 | Discharge: 2017-07-21 | Disposition: A | Discharge status: Home / Self Care | Payer: Medicare HMO | Attending: Internal Medicine | Admitting: Internal Medicine

## 2017-07-21 ENCOUNTER — Encounter: Payer: Self-pay | Admitting: Cardiovascular Disease

## 2017-07-21 DIAGNOSIS — I77810 Thoracic aortic ectasia: Secondary | ICD-10-CM

## 2017-07-21 DIAGNOSIS — I340 Nonrheumatic mitral (valve) insufficiency: Secondary | ICD-10-CM

## 2017-07-21 DIAGNOSIS — I219 Acute myocardial infarction, unspecified: Secondary | ICD-10-CM | POA: Diagnosis not present

## 2017-07-21 DIAGNOSIS — I214 Non-ST elevation (NSTEMI) myocardial infarction: Principal | ICD-10-CM

## 2017-07-21 DIAGNOSIS — I5189 Other ill-defined heart diseases: Secondary | ICD-10-CM

## 2017-07-21 HISTORY — DX: Thoracic aortic ectasia: I77.810

## 2017-07-21 HISTORY — DX: Other ill-defined heart diseases: I51.89

## 2017-07-21 LAB — HEPARIN LEVEL (UNFRACTIONATED)
HEPARIN UNFRACTIONATED: 0.49 [IU]/mL (ref 0.30–0.70)
HEPARIN UNFRACTIONATED: 0.68 [IU]/mL (ref 0.30–0.70)

## 2017-07-21 LAB — TROPONIN I
TROPONIN I: 14.21 ng/mL — AB (ref <0.03)
TROPONIN I: 12.51 ng/mL — AB (ref <0.03)

## 2017-07-21 LAB — CBC
HCT: 41.4 % (ref 40.0–52.0)
HEMOGLOBIN: 14.2 g/dL (ref 13.0–18.0)
MCH: 30.8 pg (ref 26.0–34.0)
MCHC: 34.3 g/dL (ref 32.0–36.0)
MCV: 89.8 fL (ref 80.0–100.0)
PLATELETS: 189 10*3/uL (ref 150–440)
RBC: 4.61 MIL/uL (ref 4.40–5.90)
RDW: 13.9 % (ref 11.5–14.5)
WBC: 10.8 10*3/uL — AB (ref 3.8–10.6)

## 2017-07-21 NOTE — Progress Notes (Signed)
Patient has rested quietly this shift with family visiting most of the day. Headache improved after tylenol. Patient to have cath on Monday; education printed for patient/wife and questions answered.

## 2017-07-21 NOTE — Progress Notes (Addendum)
ANTICOAGULATION CONSULT NOTE  Pharmacy Consult for Heparin Indication: chest pain/ACS  No Known Allergies  Patient Measurements: Height: 5\' 9"  (175.3 cm) Weight: 201 lb (91.2 kg) IBW/kg (Calculated) : 70.7 Heparin Dosing Weight: 89.1 kg  Vital Signs: Temp: 98.7 F (37.1 C) (11/02 1929) Temp Source: Oral (11/02 1929) BP: 144/73 (11/02 2246) Pulse Rate: 67 (11/02 1929)  Labs:  Recent Labs  07/20/17 1449 07/20/17 1545 07/20/17 2040 07/21/17 0015  HGB 14.5  --   --   --   HCT 43.2  --   --   --   PLT 204  --   --   --   APTT  --  29  --   --   LABPROT  --  12.9  --   --   INR  --  0.98  --   --   HEPARINUNFRC  --   --   --  0.68  CREATININE 0.91  --   --   --   TROPONINI 10.67*  --  12.97*  --     Estimated Creatinine Clearance: 86.7 mL/min (by C-G formula based on SCr of 0.91 mg/dL).   Medical History: Past Medical History:  Diagnosis Date  . Allergy   . Arthritis    knees, hands  . Hypertension   . Seizures ([place])    approx 25+ yrs ago.  Testing showed no cause. None since    Assessment: 66 y/o M admitted with CP and elevated troponin. Spoke with patient and he is not taking any anticoagulants.   Goal of Therapy:  Heparin level 0.3-0.7 units/ml Monitor platelets by anticoagulation protocol: Yes   Plan:  Give 4000 units bolus x 1 Start heparin infusion at 1250 units/hr Check anti-Xa level in 6 hours and daily while on heparin Continue to monitor H&H and platelets   11/3 0000 heparin level 0.68. Continue current regimen. Recheck in 6 hours to confirm. CBC with AM labs.  11/3 AM heparin level 0.49. Continue current regimen. Recheck heparin level and CBC with tomorrow AM labs.  Sim Boast, PharmD, BCPS  07/21/17 1:59 AM

## 2017-07-21 NOTE — Consult Note (Signed)
Cardiology Consultation:   Patient ID: George Doyle; [id number]; Jun 28, 1951   Admit date: 07/20/2017 Date of Consult: 07/21/2017  Primary Care Provider: Juline Patch, MD Primary Cardiologist: New   Patient Profile:   George Doyle is a 66 y.o. male with a hx of HTN who is being seen today for the evaluation of chest pain, elevated troponin at the request of Dr. Tressia Miners.  History of Present Illness:   Mr. Sennett is a pleasant 66 yo male with history of HTN and former tobacco abuse admitted with chest pain. His pain has been present on/off for 5 days. He thought it was gas. He began to have associated dyspnea 3 days ago. His chest pain was persistent yesterday and he sought medical attention. EKG without injury current. His chest pain has resolved while at rest and on IV heparin. He denies any chest pain or dyspnea this am. He denies any dizziness, near syncope or syncope, LE edema.   Past Medical History:  Diagnosis Date  . Allergy   . Arthritis    knees, hands  . Hypertension   . Seizures ([place])    approx 25+ yrs ago.  Testing showed no cause. None since    Past Surgical History:  Procedure Laterality Date  . COLONOSCOPY  2011   cleared for 10 yrs  . COLONOSCOPY WITH PROPOFOL N/A 11/06/2016   Procedure: COLONOSCOPY WITH PROPOFOL;  Surgeon: [name], MD;  Location: [facility];  Service: Endoscopy;  Laterality: N/A;  . HERNIA REPAIR    . KNEE SURGERY Bilateral    2 on R) 1 on L)  . POLYPECTOMY  11/06/2016   Procedure: POLYPECTOMY;  Surgeon: [name], MD;  Location: [facility];  Service: Endoscopy;;    Inpatient Medications: Scheduled Meds: . aspirin  324 mg Oral NOW   Or  . aspirin  300 mg Rectal NOW  . aspirin EC  81 mg Oral Daily  . atorvastatin  20 mg Oral q1800  . metoprolol tartrate  25 mg Oral BID  . nitroGLYCERIN  0.5 inch Topical Q6H  . ramipril  20 mg Oral Daily   Continuous Infusions: . heparin 14  Units/kg/hr (07/21/17 0730)   PRN Meds: acetaminophen, nitroGLYCERIN, ondansetron (ZOFRAN) IV  Allergies:   No Known Allergies  Social History:   Social History   Social History  . Marital status: Married    Spouse name: N/A  . Number of children: N/A  . Years of education: N/A   Occupational History  . Not on file.   Social History Main Topics  . Smoking status: Former Research scientist (life sciences)  . Smokeless tobacco: Never Used     Comment: quit over 20 yrs ago  . Alcohol use 0.0 oz/week     Comment: 1-2 drinks/year  . Drug use: No  . Sexual activity: Not Currently   Other Topics Concern  . Not on file   Social History Narrative  . No narrative on file    Family History:    Family History  Problem Relation Age of Onset  . CAD Father      ROS:  Please see the history of present illness.  ROS  All other ROS reviewed and negative.     Physical Exam/Data:   Vitals:   07/20/17 2154 07/20/17 2246 07/21/17 0430 07/21/17 0748  BP: (!) 160/88 (!) 144/73 (!) 149/78 (!) 150/82  Pulse:   66 64  Resp:   19 18  Temp:   97.6 F (  36.4 C) 97.9 F (36.6 C)  TempSrc:   Oral Oral  SpO2:   96% 96%  Weight:      Height:        Intake/Output Summary (Last 24 hours) at 07/21/17 0839 Last data filed at 07/20/17 2327  Gross per 24 hour  Intake                0 ml  Output              350 ml  Net             -350 ml   Filed Weights   07/20/17 1451 07/20/17 1931  Weight: 200 lb (90.7 kg) 201 lb (91.2 kg)   Body mass index is 29.68 kg/m.  General:  Well nourished, well developed, in no acute distress HEENT: normal Lymph: no adenopathy Neck: no JVD Endocrine:  No thryomegaly Vascular: No carotid bruits; FA pulses 2+ bilaterally without bruits  Cardiac:  normal S1, S2; RRR; no murmur  Lungs:  clear to auscultation bilaterally, no wheezing, rhonchi or rales  Abd: soft, nontender, no hepatomegaly  Ext: no edema Musculoskeletal:  No deformities, BUE and BLE strength normal and  equal Skin: warm and dry  Neuro:  CNs 2-12 intact, no focal abnormalities noted Psych:  Normal affect   EKG:  The EKG was personally reviewed and demonstrates:  Sinus rhythm, 1st degree AV block. Subtle T wave flattening. LVH Telemetry:  Telemetry was personally reviewed and demonstrates:  Sinus  Relevant CV Studies:   Laboratory Data:  Chemistry  Recent Labs Lab 07/20/17 1449  NA 135  K 3.4*  CL 100*  CO2 28  GLUCOSE 97  BUN 16  CREATININE 0.91  CALCIUM 9.2  GFRNONAA >60  GFRAA >60  ANIONGAP 7    No results for input(s): PROT, ALBUMIN, AST, ALT, ALKPHOS, BILITOT in the last 168 hours. Hematology  Recent Labs Lab 07/20/17 1449 07/21/17 0306  WBC 12.3* 10.8*  RBC 4.78 4.61  HGB 14.5 14.2  HCT 43.2 41.4  MCV 90.2 89.8  MCH 30.2 30.8  MCHC 33.5 34.3  RDW 13.5 13.9  PLT 204 189   Cardiac Enzymes  Recent Labs Lab 07/20/17 1449 07/20/17 2040 07/21/17 0306  TROPONINI 10.67* 12.97* 14.21*   No results for input(s): TROPIPOC in the last 168 hours.  BNPNo results for input(s): BNP, PROBNP in the last 168 hours.  DDimer No results for input(s): DDIMER in the last 168 hours.  Radiology/Studies:  Dg Chest 2 View  Result Date: 07/20/2017 CLINICAL DATA:  Chest pain. EXAM: CHEST  2 VIEW COMPARISON:  04/10/2011 FINDINGS: The heart size and mediastinal contours are within normal limits. Both lungs are clear. Chronic elevation of the right hemidiaphragm. Colon is interposed between the right hemidiaphragm and the liver, chronic. The visualized skeletal structures are unremarkable. IMPRESSION: No active cardiopulmonary disease. Electronically Signed   By: Lorriane Shire M.D.   On: 07/20/2017 15:27    Assessment and Plan:   1. NSTEMI: Pt admitted with chest pain. Troponin elevation c/w NSTEMI. He is now on IV heparin and is chest pain free. I have reviewed his EKG from yesterday and there was no injury pattern noted. Will order EKG this am. He will need a cardiac cath.  Based on the timing of his admission, the cardiac cath will be on Monday morning if he remains stable. I have reviewed the risks, indications, and alternatives to cardiac catheterization, possible angioplasty, and stenting with the patient.  Risks include but are not limited to bleeding, infection, vascular injury, stroke, myocardial infection, arrhythmia, kidney injury, radiation-related injury in the case of prolonged fluoroscopy use, emergency cardiac surgery, and death. The patient understands the risks of serious complication is 1-2 in 5102 with diagnostic cardiac cath and 1-2% or less with angioplasty/stenting.  -Continue ASA, IV heparin, beta blocker and statin -He can eat until Sunday night -Echo today -I would start IV NTG if he has recurrent chest pain -EKG this am    I will follow along this weekend. If his clinical status changes, he would need cardiac cath over the weekend.   For questions or updates, please contact [place] Please consult [email or web address] for contact info under Cardiology/STEMI.   Signed, Lauree Chandler, MD  07/21/2017 8:39 AM

## 2017-07-21 NOTE — Progress Notes (Signed)
[facility] [facility] [place] [facility] [place] NAME: George Doyle    MR#:  [id number]  DATE OF BIRTH:  May 05, 1951  SUBJECTIVE:  CHIEF COMPLAINT:   Chief Complaint  Patient presents with  . Chest Pain  . Shortness of Breath   -   REVIEW OF SYSTEMS:  Review of Systems  Constitutional: Negative for chills, fever and malaise/fatigue.  HENT: Negative for congestion, ear discharge, hearing loss and nosebleeds.   Eyes: Negative for blurred vision, double vision and photophobia.  Respiratory: Positive for shortness of breath. Negative for cough and wheezing.   Cardiovascular: Negative for chest pain, palpitations and leg swelling.  Gastrointestinal: Negative for abdominal pain, constipation, diarrhea, nausea and vomiting.  Genitourinary: Negative for dysuria.  Musculoskeletal: Positive for back pain and myalgias.  Neurological: Positive for headaches. Negative for dizziness, speech change, focal weakness and seizures.    DRUG ALLERGIES:  No Known Allergies  VITALS:  Blood pressure (!) 143/76, pulse (!) 59, temperature 97.9 F (36.6 C), temperature source Oral, resp. rate 18, height 5\' 9"  (1.753 m), weight 91.2 kg (201 lb), SpO2 96 %.  PHYSICAL EXAMINATION:  Physical Exam  GENERAL:  66 y.o.-year-old patient lying in the bed with no acute distress.  EYES: Pupils equal, round, reactive to light and accommodation. No scleral icterus. Extraocular muscles intact.  HEENT: Head atraumatic, normocephalic. Oropharynx and nasopharynx clear.  NECK:  Supple, no jugular venous distention. No thyroid enlargement, no tenderness.  LUNGS: Normal breath sounds bilaterally, no wheezing, rales,rhonchi or crepitation. No use of accessory muscles of respiration.  CARDIOVASCULAR: S1, S2 normal. No murmurs, rubs, or gallops.  ABDOMEN: Soft, nontender, nondistended. Bowel sounds present. No organomegaly or mass.  EXTREMITIES: No pedal edema, cyanosis, or clubbing.  NEUROLOGIC:  Cranial nerves II through XII are intact. Muscle strength 5/5 in all extremities. Sensation intact. Gait not checked.  PSYCHIATRIC: The patient is alert and oriented x 3.  SKIN: No obvious rash, lesion, or ulcer.    LABORATORY PANEL:   CBC  Recent Labs Lab 07/21/17 0306  WBC 10.8*  HGB 14.2  HCT 41.4  PLT 189   ------------------------------------------------------------------------------------------------------------------  Chemistries   Recent Labs Lab 07/20/17 1449  NA 135  K 3.4*  CL 100*  CO2 28  GLUCOSE 97  BUN 16  CREATININE 0.91  CALCIUM 9.2   ------------------------------------------------------------------------------------------------------------------  Cardiac Enzymes  Recent Labs Lab 07/21/17 0826  TROPONINI 12.51*   ------------------------------------------------------------------------------------------------------------------  RADIOLOGY:  Dg Chest 2 View  Result Date: 07/20/2017 CLINICAL DATA:  Chest pain. EXAM: CHEST  2 VIEW COMPARISON:  04/10/2011 FINDINGS: The heart size and mediastinal contours are within normal limits. Both lungs are clear. Chronic elevation of the right hemidiaphragm. Colon is interposed between the right hemidiaphragm and the liver, chronic. The visualized skeletal structures are unremarkable. IMPRESSION: No active cardiopulmonary disease. Electronically Signed   By: Lorriane Shire M.D.   On: 07/20/2017 15:27    EKG:   Orders placed or performed during the hospital encounter of 07/20/17  . ED EKG within 10 minutes  . ED EKG within 10 minutes  . EKG 12-Lead  . EKG 12-Lead  . EKG 12-lead  . EKG 12-Lead  . EKG 12-Lead  . EKG 12-Lead  . EKG 12-Lead    ASSESSMENT AND PLAN:   66 year old male with past medical history significant for hypertension and former smoking presents with chest pain and elevated troponin  #1 NSTEMI- on heparin gtt - appreciate cardiology consult - asa, statin, Plavix -  Also started on  low-dose metoprolol and Altace - cardiac cath Monday - no chest pain now -Echocardiogram today  #2 DVT prophylaxis-already on  heparin drip    All the records are reviewed and case discussed with Care Management/Social Workerr. Management plans discussed with the patient, family and they are in agreement.  CODE STATUS: Full Code  TOTAL TIME TAKING CARE OF THIS PATIENT: 37 minutes.   POSSIBLE D/C IN 2-3 DAYS, DEPENDING ON CLINICAL CONDITION.   Kassady Laboy M.D on 07/21/2017 at 1:25 PM  Between 7am to 6pm - Pager - [phone]  After 6pm go to [email or web address] - password [email or web address] [facility]  Office  [phone]  CC: Primary care physician; Juline Patch, MD

## 2017-07-22 LAB — BASIC METABOLIC PANEL
Anion gap: 6 (ref 5–15)
BUN: 16 mg/dL (ref 6–20)
CHLORIDE: 105 mmol/L (ref 101–111)
CO2: 26 mmol/L (ref 22–32)
Calcium: 8.7 mg/dL — ABNORMAL LOW (ref 8.9–10.3)
Creatinine, Ser: 0.98 mg/dL (ref 0.61–1.24)
GFR calc non Af Amer: >60 mL/min (ref >60)
Glucose, Bld: 103 mg/dL — ABNORMAL HIGH (ref 65–99)
POTASSIUM: 3.7 mmol/L (ref 3.5–5.1)
SODIUM: 137 mmol/L (ref 135–145)

## 2017-07-22 LAB — CBC
HEMATOCRIT: 40.9 % (ref 40.0–52.0)
HEMOGLOBIN: 14 g/dL (ref 13.0–18.0)
MCH: 30.7 pg (ref 26.0–34.0)
MCHC: 34.2 g/dL (ref 32.0–36.0)
MCV: 89.8 fL (ref 80.0–100.0)
Platelets: 203 10*3/uL (ref 150–440)
RBC: 4.55 MIL/uL (ref 4.40–5.90)
RDW: 13.5 % (ref 11.5–14.5)
WBC: 10 10*3/uL (ref 3.8–10.6)

## 2017-07-22 LAB — TROPONIN I: Troponin I: 12.05 ng/mL (ref <0.03)

## 2017-07-22 LAB — HEPARIN LEVEL (UNFRACTIONATED): HEPARIN UNFRACTIONATED: 0.61 [IU]/mL (ref 0.30–0.70)

## 2017-07-22 MED ORDER — ASPIRIN 81 MG PO CHEW
81.0000 mg | CHEWABLE_TABLET | ORAL | Status: AC
  Administered 2017-07-23: 81 mg via ORAL
  Filled 2017-07-22: qty 1

## 2017-07-22 MED ORDER — SODIUM CHLORIDE 0.9% FLUSH
3.0000 mL | Freq: Two times a day (BID) | INTRAVENOUS | Status: DC
  Administered 2017-07-22 – 2017-07-24 (×3): 3 mL via INTRAVENOUS

## 2017-07-22 MED ORDER — SODIUM CHLORIDE 0.9 % IV SOLN
INTRAVENOUS | Status: DC
  Administered 2017-07-22: 20:00:00 via INTRAVENOUS

## 2017-07-22 NOTE — Progress Notes (Signed)
ANTICOAGULATION CONSULT NOTE  Pharmacy Consult for Heparin Indication: chest pain/ACS  No Known Allergies  Patient Measurements: Height: 5\' 9"  (175.3 cm) Weight: 199 lb 8 oz (90.5 kg) IBW/kg (Calculated) : 70.7 Heparin Dosing Weight: 89.1 kg  Vital Signs: Temp: 98.1 F (36.7 C) (11/04 0445) Temp Source: Oral (11/04 0445) BP: 132/67 (11/04 0445) Pulse Rate: 63 (11/04 0445)  Labs: Recent Labs    07/20/17 1449 07/20/17 1545  07/21/17 0015 07/21/17 0306 07/21/17 0549 07/21/17 0826 07/22/17 0436  HGB 14.5  --   --   --  14.2  --   --  14.0  HCT 43.2  --   --   --  41.4  --   --  40.9  PLT 204  --   --   --  189  --   --  203  APTT  --  29  --   --   --   --   --   --   LABPROT  --  12.9  --   --   --   --   --   --   INR  --  0.98  --   --   --   --   --   --   HEPARINUNFRC  --   --   --  0.68  --  0.49  --  0.61  CREATININE 0.91  --   --   --   --   --   --  0.98  TROPONINI 10.67*  --    < >  --  14.21*  --  12.51* 12.05*   < > = values in this interval not displayed.    Estimated Creatinine Clearance: 80.2 mL/min (by C-G formula based on SCr of 0.98 mg/dL).   Medical History: Past Medical History:  Diagnosis Date  . Allergy   . Arthritis    knees, hands  . Hypertension   . Seizures ([place])    approx 25+ yrs ago.  Testing showed no cause. None since    Assessment: 66 y/o M admitted with CP and elevated troponin. Spoke with patient and he is not taking any anticoagulants.   Goal of Therapy:  Heparin level 0.3-0.7 units/ml Monitor platelets by anticoagulation protocol: Yes   Plan:  Give 4000 units bolus x 1 Start heparin infusion at 1250 units/hr Check anti-Xa level in 6 hours and daily while on heparin Continue to monitor H&H and platelets   11/3 0000 heparin level 0.68. Continue current regimen. Recheck in 6 hours to confirm. CBC with AM labs.  11/3 AM heparin level 0.49. Continue current regimen. Recheck heparin level and CBC with tomorrow AM  labs.  11/4 AM heparin level 0.61. Continue current regimen. Recheck heparin level and CBC with tomorrow AM labs.  Sim Boast, PharmD, BCPS  07/22/17 5:39 AM

## 2017-07-22 NOTE — Progress Notes (Signed)
[facility] at [place] NAME: George Doyle    MR#:  [id number]  DATE OF BIRTH:  [birth year]  SUBJECTIVE:  CHIEF COMPLAINT:   Chief Complaint  Patient presents with  . Chest Pain  . Shortness of Breath   - Patient denies any chest pain today, no shortness of breath or back pain. Feels great. For cardiac catheter tomorrow  REVIEW OF SYSTEMS:  Review of Systems  Constitutional: Negative for chills, fever and malaise/fatigue.  HENT: Negative for congestion, ear discharge, hearing loss and nosebleeds.   Eyes: Negative for blurred vision, double vision and photophobia.  Respiratory: Negative for cough, shortness of breath and wheezing.   Cardiovascular: Negative for chest pain, palpitations and leg swelling.  Gastrointestinal: Negative for abdominal pain, constipation, diarrhea, nausea and vomiting.  Genitourinary: Negative for dysuria.  Musculoskeletal: Negative for back pain and myalgias.  Neurological: Negative for dizziness, speech change, focal weakness, seizures and headaches.    DRUG ALLERGIES:  No Known Allergies  VITALS:  Blood pressure 136/72, pulse 66, temperature 97.6 F (36.4 C), resp. rate 18, height 5\' 9"  (1.753 m), weight 90.5 kg (199 lb 8 oz), SpO2 96 %.  PHYSICAL EXAMINATION:  Physical Exam  GENERAL:  66 y.o.-year-old patient lying in the bed with no acute distress.  EYES: Pupils equal, round, reactive to light and accommodation. No scleral icterus. Extraocular muscles intact.  HEENT: Head atraumatic, normocephalic. Oropharynx and nasopharynx clear.  NECK:  Supple, no jugular venous distention. No thyroid enlargement, no tenderness.  LUNGS: Normal breath sounds bilaterally, no wheezing, rales,rhonchi or crepitation. No use of accessory muscles of respiration.  CARDIOVASCULAR: S1, S2 normal. No murmurs, rubs, or gallops.  ABDOMEN: Soft, nontender, nondistended. Bowel sounds present. No organomegaly or mass.    EXTREMITIES: No pedal edema, cyanosis, or clubbing.  NEUROLOGIC: Cranial nerves II through XII are intact. Muscle strength 5/5 in all extremities. Sensation intact. Gait not checked.  PSYCHIATRIC: The patient is alert and oriented x 3.  SKIN: No obvious rash, lesion, or ulcer.    LABORATORY PANEL:   CBC Recent Labs  Lab 07/22/17 0436  WBC 10.0  HGB 14.0  HCT 40.9  PLT 203   ------------------------------------------------------------------------------------------------------------------  Chemistries  Recent Labs  Lab 07/22/17 0436  NA 137  K 3.7  CL 105  CO2 26  GLUCOSE 103*  BUN 16  CREATININE 0.98  CALCIUM 8.7*   ------------------------------------------------------------------------------------------------------------------  Cardiac Enzymes Recent Labs  Lab 07/22/17 0436  TROPONINI 12.05*   ------------------------------------------------------------------------------------------------------------------  RADIOLOGY:  Dg Chest 2 View  Result Date: 07/20/2017 CLINICAL DATA:  Chest pain. EXAM: CHEST  2 VIEW COMPARISON:  04/10/2011 FINDINGS: The heart size and mediastinal contours are within normal limits. Both lungs are clear. Chronic elevation of the right hemidiaphragm. Colon is interposed between the right hemidiaphragm and the liver, chronic. The visualized skeletal structures are unremarkable. IMPRESSION: No active cardiopulmonary disease. Electronically Signed   By: Lorriane Shire M.D.   On: 07/20/2017 15:27    EKG:   Orders placed or performed during the hospital encounter of 07/20/17  . ED EKG within 10 minutes  . ED EKG within 10 minutes  . EKG 12-Lead  . EKG 12-Lead  . EKG 12-lead  . EKG 12-Lead  . EKG 12-Lead  . EKG 12-Lead  . EKG 12-Lead    ASSESSMENT AND PLAN:   66 year old male with past medical history significant for hypertension and former smoking presents with chest pain and elevated troponin  #1  NSTEMI- on heparin gtt -  appreciate cardiology consult - asa, statin, Plavix -Also started on low-dose metoprolol and Altace - cardiac cath Monday - no chest pain now -Echocardiogram with slight LVH, EF of 50-55%. No wall motion abnormalities. Troponin is plateaued at this time -Cardiac rehabilitation at discharge  #2 DVT prophylaxis-already on  heparin drip    All the records are reviewed and case discussed with Care Management/Social Workerr. Management plans discussed with the patient, family and they are in agreement.  CODE STATUS: Full Code  TOTAL TIME TAKING CARE OF THIS PATIENT: 34 minutes.   POSSIBLE D/C IN 1-2 DAYS, DEPENDING ON CLINICAL CONDITION.   Gladstone Lighter M.D on 07/22/2017 at 11:51 AM  Between 7am to 6pm - Pager - [phone]  After 6pm go to [email or web address] - password [email or web address] [facility]  Office  [phone]  CC: Primary care physician; Juline Patch, MD

## 2017-07-22 NOTE — Progress Notes (Signed)
Progress Note  Patient Name: George Doyle Date of Encounter: 07/22/2017  Primary Cardiologist: New  Subjective   No chest pain or dyspnea this am.   Inpatient Medications    Scheduled Meds: . aspirin EC  81 mg Oral Daily  . atorvastatin  20 mg Oral q1800  . metoprolol tartrate  25 mg Oral BID  . nitroGLYCERIN  0.5 inch Topical Q6H  . ramipril  20 mg Oral Daily   Continuous Infusions: . heparin 14 Units/kg/hr (07/21/17 0730)   PRN Meds: acetaminophen, nitroGLYCERIN, ondansetron (ZOFRAN) IV   Vital Signs    Vitals:   07/21/17 1601 07/21/17 1947 07/22/17 0445 07/22/17 0900  BP: (!) 111/52 132/70 132/67 136/72  Pulse: (!) 59 66 63 66  Resp: 16 18 18 18   Temp: 98 F (36.7 C) 98.3 F (36.8 C) 98.1 F (36.7 C) 97.6 F (36.4 C)  TempSrc: Oral Oral Oral   SpO2: 97% 99% 95% 96%  Weight:   199 lb 8 oz (90.5 kg)   Height:        Intake/Output Summary (Last 24 hours) at 07/22/2017 0948 Last data filed at 07/22/2017 0700 Gross per 24 hour  Intake 386.67 ml  Output 800 ml  Net -413.33 ml   Filed Weights   07/20/17 1451 07/20/17 1931 07/22/17 0445  Weight: 200 lb (90.7 kg) 201 lb (91.2 kg) 199 lb 8 oz (90.5 kg)    Telemetry    Sinus - Personally Reviewed  ECG    No am EKG to review - Personally Reviewed  Physical Exam   General: Well developed, well nourished, NAD  HEENT: OP clear, mucus membranes moist  SKIN: warm, dry. No rashes. Neuro: No focal deficits  Musculoskeletal: Muscle strength 5/5 all ext  Psychiatric: Mood and affect normal  Neck: No JVD, no carotid bruits, no thyromegaly, no lymphadenopathy.  Lungs:Clear bilaterally, no wheezes, rhonci, crackles Cardiovascular: Regular rate and rhythm. No murmurs, gallops or rubs. Abdomen:Soft. Bowel sounds present. Non-tender.  Extremities: No lower extremity edema. Pulses are 2 + in the bilateral DP/PT.   Labs    Chemistry Recent Labs  Lab 07/20/17 1449 07/22/17 0436  NA 135 137  K  3.4* 3.7  CL 100* 105  CO2 28 26  GLUCOSE 97 103*  BUN 16 16  CREATININE 0.91 0.98  CALCIUM 9.2 8.7*  GFRNONAA >60 >60  GFRAA >60 >60  ANIONGAP 7 6     Hematology Recent Labs  Lab 07/20/17 1449 07/21/17 0306 07/22/17 0436  WBC 12.3* 10.8* 10.0  RBC 4.78 4.61 4.55  HGB 14.5 14.2 14.0  HCT 43.2 41.4 40.9  MCV 90.2 89.8 89.8  MCH 30.2 30.8 30.7  MCHC 33.5 34.3 34.2  RDW 13.5 13.9 13.5  PLT 204 189 203    Cardiac Enzymes Recent Labs  Lab 07/20/17 2040 07/21/17 0306 07/21/17 0826 07/22/17 0436  TROPONINI 12.97* 14.21* 12.51* 12.05*   No results for input(s): TROPIPOC in the last 168 hours.   BNPNo results for input(s): BNP, PROBNP in the last 168 hours.   DDimer No results for input(s): DDIMER in the last 168 hours.   Radiology    Dg Chest 2 View  Result Date: 07/20/2017 CLINICAL DATA:  Chest pain. EXAM: CHEST  2 VIEW COMPARISON:  04/10/2011 FINDINGS: The heart size and mediastinal contours are within normal limits. Both lungs are clear. Chronic elevation of the right hemidiaphragm. Colon is interposed between the right hemidiaphragm and the liver, chronic. The visualized skeletal structures are  unremarkable. IMPRESSION: No active cardiopulmonary disease. Electronically Signed   By: Lorriane Shire M.D.   On: 07/20/2017 15:27    Cardiac Studies   Echo 07/21/17: - Left ventricle: The cavity size was normal. There was mild   concentric hypertrophy. Systolic function was normal. The   estimated ejection fraction was in the range of 55% to 60%. Wall   motion was normal; there were no regional wall motion   abnormalities. Doppler parameters are consistent with abnormal   left ventricular relaxation (grade 1 diastolic dysfunction). - Aorta: Aortic root was mildly dilated, 3.6 cm - Ascending aorta: The ascending aorta was mildly dilated, 4.1 cm - Mitral valve: There was mild regurgitation. - Right ventricle: Systolic function was normal. - Pulmonary arteries: Systolic  pressure could not be accurately   estimated.  Patient Profile     66 y.o. male with history HTN and former tobacco abuse admitted with c/o chest pain and dyspnea and found to have NSTEMI.   Assessment & Plan     1. NSTEMI: Admitted with chest pain and found to have elevated troponin c/w NSTEMI. He is chest pain free on IV heparin. He is on ASA, beta blocker, Ace-ih and statin. Echo 07/21/17 showed normal LV systolic function with no wall motion abnormalities.  -Will continue current therapy today including IV heparin. I would start IV NTG if he has onset of chest pressure.  -Plans for cardiac cath tomorrow morning with possible PCI. Risks and benefits of procedure reviewed by me with pt yesterday and documented in yesterday's note. He agrees to proceed.  -NPO at midnight -I have placed orders for cath   I will follow along this weekend. If his clinical status changes, he would need cardiac cath over the weekend.    For questions or updates, please contact [place] Please consult [email or web address] for contact info under Cardiology/STEMI.      Signed, Lauree Chandler, MD  07/22/2017, 9:48 AM

## 2017-07-23 ENCOUNTER — Other Ambulatory Visit: Payer: Self-pay

## 2017-07-23 ENCOUNTER — Encounter
Admission: EM | Disposition: A | Discharge status: Home / Self Care | Payer: Self-pay | Source: Home / Self Care | Attending: Internal Medicine

## 2017-07-23 DIAGNOSIS — F17200 Nicotine dependence, unspecified, uncomplicated: Secondary | ICD-10-CM

## 2017-07-23 DIAGNOSIS — E782 Mixed hyperlipidemia: Secondary | ICD-10-CM

## 2017-07-23 DIAGNOSIS — I25110 Atherosclerotic heart disease of native coronary artery with unstable angina pectoris: Secondary | ICD-10-CM

## 2017-07-23 DIAGNOSIS — R079 Chest pain, unspecified: Secondary | ICD-10-CM

## 2017-07-23 HISTORY — PX: LEFT HEART CATH AND CORONARY ANGIOGRAPHY: CATH118249

## 2017-07-23 HISTORY — PX: CORONARY STENT INTERVENTION: CATH118234

## 2017-07-23 LAB — BASIC METABOLIC PANEL
Anion gap: 6 (ref 5–15)
BUN: 19 mg/dL (ref 6–20)
CHLORIDE: 104 mmol/L (ref 101–111)
CO2: 27 mmol/L (ref 22–32)
Calcium: 8.8 mg/dL — ABNORMAL LOW (ref 8.9–10.3)
Creatinine, Ser: 1.07 mg/dL (ref 0.61–1.24)
GFR calc Af Amer: >60 mL/min (ref >60)
GFR calc non Af Amer: >60 mL/min (ref >60)
Glucose, Bld: 100 mg/dL — ABNORMAL HIGH (ref 65–99)
POTASSIUM: 3.7 mmol/L (ref 3.5–5.1)
Sodium: 137 mmol/L (ref 135–145)

## 2017-07-23 LAB — ECHOCARDIOGRAM COMPLETE
HEIGHTINCHES: 69 in
Weight: 3216 oz

## 2017-07-23 LAB — CBC
HCT: 42.9 % (ref 40.0–52.0)
HEMOGLOBIN: 14.3 g/dL (ref 13.0–18.0)
MCH: 29.8 pg (ref 26.0–34.0)
MCHC: 33.3 g/dL (ref 32.0–36.0)
MCV: 89.5 fL (ref 80.0–100.0)
Platelets: 214 10*3/uL (ref 150–440)
RBC: 4.79 MIL/uL (ref 4.40–5.90)
RDW: 13.7 % (ref 11.5–14.5)
WBC: 9.6 10*3/uL (ref 3.8–10.6)

## 2017-07-23 LAB — HEPARIN LEVEL (UNFRACTIONATED): HEPARIN UNFRACTIONATED: 0.6 [IU]/mL (ref 0.30–0.70)

## 2017-07-23 LAB — POCT ACTIVATED CLOTTING TIME: ACTIVATED CLOTTING TIME: 312 s

## 2017-07-23 SURGERY — LEFT HEART CATH AND CORONARY ANGIOGRAPHY
Anesthesia: Moderate Sedation

## 2017-07-23 SURGERY — CORONARY ANGIOGRAPHY (CATH LAB)
Anesthesia: Moderate Sedation

## 2017-07-23 MED ORDER — SODIUM CHLORIDE 0.9% FLUSH: 3.0000 mL | INTRAVENOUS | Status: DC | PRN

## 2017-07-23 MED ORDER — MIDAZOLAM HCL 2 MG/2ML IJ SOLN
INTRAMUSCULAR | Status: AC
  Filled 2017-07-23: qty 2

## 2017-07-23 MED ORDER — ASPIRIN 81 MG PO CHEW
CHEWABLE_TABLET | ORAL | Status: DC | PRN
  Administered 2017-07-23: 243 mg via ORAL

## 2017-07-23 MED ORDER — SODIUM CHLORIDE 0.9% FLUSH: 3.0000 mL | Freq: Two times a day (BID) | INTRAVENOUS | Status: DC

## 2017-07-23 MED ORDER — SODIUM CHLORIDE 0.9 % IV SOLN
INTRAVENOUS | Status: DC | PRN
  Administered 2017-07-23: 1.75 mg/kg/h via INTRAVENOUS

## 2017-07-23 MED ORDER — SODIUM CHLORIDE 0.9 % WEIGHT BASED INFUSION
1.0000 mL/kg/h | INTRAVENOUS | Status: AC
  Administered 2017-07-23: 1 mL/kg/h via INTRAVENOUS

## 2017-07-23 MED ORDER — SODIUM CHLORIDE 0.9 % IV SOLN: 250.0000 mL | INTRAVENOUS | Status: DC | PRN

## 2017-07-23 MED ORDER — IOPAMIDOL (ISOVUE-300) INJECTION 61%
INTRAVENOUS | Status: DC | PRN
  Administered 2017-07-23: 140 mL via INTRA_ARTERIAL

## 2017-07-23 MED ORDER — IOPAMIDOL (ISOVUE-300) INJECTION 61%
INTRAVENOUS | Status: DC | PRN
  Administered 2017-07-23: 70 mL via INTRA_ARTERIAL

## 2017-07-23 MED ORDER — HEPARIN (PORCINE) IN NACL 2-0.9 UNIT/ML-% IJ SOLN
INTRAMUSCULAR | Status: AC
  Filled 2017-07-23: qty 1000

## 2017-07-23 MED ORDER — FENTANYL CITRATE (PF) 100 MCG/2ML IJ SOLN
INTRAMUSCULAR | Status: DC | PRN
  Administered 2017-07-23 (×2): 25 ug via INTRAVENOUS

## 2017-07-23 MED ORDER — CLOPIDOGREL BISULFATE 75 MG PO TABS
75.0000 mg | ORAL_TABLET | Freq: Every day | ORAL | Status: DC
  Administered 2017-07-24: 75 mg via ORAL
  Filled 2017-07-23: qty 1

## 2017-07-23 MED ORDER — ASPIRIN 81 MG PO CHEW
CHEWABLE_TABLET | ORAL | Status: AC
  Filled 2017-07-23: qty 3

## 2017-07-23 MED ORDER — ATORVASTATIN CALCIUM 20 MG PO TABS
80.0000 mg | ORAL_TABLET | Freq: Every day | ORAL | Status: DC
  Administered 2017-07-23: 80 mg via ORAL
  Filled 2017-07-23: qty 4

## 2017-07-23 MED ORDER — BIVALIRUDIN BOLUS VIA INFUSION - CUPID
INTRAVENOUS | Status: DC | PRN
  Administered 2017-07-23: 68.025 mg via INTRAVENOUS

## 2017-07-23 MED ORDER — MIDAZOLAM HCL 2 MG/2ML IJ SOLN
INTRAMUSCULAR | Status: DC | PRN
  Administered 2017-07-23 (×2): 1 mg via INTRAVENOUS

## 2017-07-23 MED ORDER — ENOXAPARIN SODIUM 40 MG/0.4ML ~~LOC~~ SOLN
40.0000 mg | SUBCUTANEOUS | Status: DC
  Administered 2017-07-23: 40 mg via SUBCUTANEOUS
  Filled 2017-07-23: qty 0.4

## 2017-07-23 MED ORDER — SODIUM CHLORIDE 0.9 % WEIGHT BASED INFUSION: 1.0000 mL/kg/h | INTRAVENOUS | Status: DC

## 2017-07-23 MED ORDER — SODIUM CHLORIDE 0.9 % WEIGHT BASED INFUSION: 3.0000 mL/kg/h | INTRAVENOUS | Status: DC

## 2017-07-23 MED ORDER — ONDANSETRON HCL 4 MG/2ML IJ SOLN: 4.0000 mg | Freq: Four times a day (QID) | INTRAMUSCULAR | Status: DC | PRN

## 2017-07-23 MED ORDER — ACETAMINOPHEN 325 MG PO TABS: 650.0000 mg | ORAL_TABLET | ORAL | Status: DC | PRN

## 2017-07-23 MED ORDER — ASPIRIN 81 MG PO CHEW
81.0000 mg | CHEWABLE_TABLET | Freq: Every day | ORAL | Status: DC
  Administered 2017-07-23: 81 mg via ORAL
  Filled 2017-07-23 (×2): qty 1

## 2017-07-23 MED ORDER — TICAGRELOR 90 MG PO TABS
ORAL_TABLET | ORAL | Status: AC
  Filled 2017-07-23: qty 2

## 2017-07-23 MED ORDER — BIVALIRUDIN TRIFLUOROACETATE 250 MG IV SOLR
INTRAVENOUS | Status: AC
  Filled 2017-07-23: qty 250

## 2017-07-23 MED ORDER — LIDOCAINE HCL (PF) 1 % IJ SOLN
INTRAMUSCULAR | Status: AC
  Filled 2017-07-23: qty 30

## 2017-07-23 MED ORDER — FENTANYL CITRATE (PF) 100 MCG/2ML IJ SOLN
INTRAMUSCULAR | Status: AC
  Filled 2017-07-23: qty 2

## 2017-07-23 MED ORDER — NITROGLYCERIN 5 MG/ML IV SOLN
INTRAVENOUS | Status: AC
  Filled 2017-07-23: qty 10

## 2017-07-23 SURGICAL SUPPLY — 18 items
CATH INFINITI 5 FR 3DRC (CATHETERS) ×2 IMPLANT
CATH INFINITI 5FR ANG PIGTAIL (CATHETERS) ×2 IMPLANT
CATH INFINITI 5FR JL4 (CATHETERS) ×2 IMPLANT
CATH INFINITI 5FR JL5 (CATHETERS) ×2 IMPLANT
CATH INFINITI JR4 5F (CATHETERS) ×2 IMPLANT
CATH VISTA GUIDE 6FR 3DRC (CATHETERS) ×2 IMPLANT
DEVICE CLOSURE MYNXGRIP 6/7F (Vascular Products) ×2 IMPLANT
DEVICE INFLAT 30 PLUS (MISCELLANEOUS) ×2 IMPLANT
DEVICE SAFEGUARD 24CM (GAUZE/BANDAGES/DRESSINGS) ×2 IMPLANT
KIT MANI 3VAL PERCEP (MISCELLANEOUS) ×2 IMPLANT
NEEDLE PERC 18GX7CM (NEEDLE) ×2 IMPLANT
PACK CARDIAC CATH (CUSTOM PROCEDURE TRAY) ×2 IMPLANT
SHEATH AVANTI 5FR X 11CM (SHEATH) ×2 IMPLANT
SHEATH AVANTI 6FR X 11CM (SHEATH) ×2 IMPLANT
WIRE EMERALD 3MM-J .035X150CM (WIRE) ×4 IMPLANT
WIRE G HI TQ BMW 190 (WIRE) ×2 IMPLANT
WIRE HI TORQ WHISPER MS 190CM (WIRE) ×2 IMPLANT
WIRE INTUITION PROPEL ST 180CM (WIRE) ×2 IMPLANT

## 2017-07-23 NOTE — Progress Notes (Addendum)
Patient referred to Cardiac Rehab with dx of NSTEMI.  Patient with hx of HTN and former smoker.  Patient underwent cardiac cath today revealing 100% chronic occlusion of RCA with collaterals.  Unable to perform PCI.  Plan to medically manage with outpatient cardiology follow-up.  Patient on ASA, Plavix, Statin, metoprolol and Altace.  Echo performed this admission with EF of 50 - 55%.  This RN entered referral for outpatient Cardiac Rehab.  Dietitian Consultation for education for heart healthy diet ordered.    Attempted to round on patient.  Found a note on patient's door, which stated, "Do Not Disturb Until 3:30 p.m."  If unable to see patient this afternoon, then will see patient in the morning.    Roanna Epley, RN, BSN, [facility] Cardiovascular and Pulmonary Nurse Navigator

## 2017-07-23 NOTE — Progress Notes (Signed)
Patient clinically stable post heart cath per Dr Lucien Mons and son at bedside. Right groin without bleeding nor hematoma, pad pressure device on with 29ml air instilled. Vitals stable at this time. sr per monitor. Dr Clayborn Bigness as well as Dr. Rockey Situ out to speak with wife and son with questions answered. Patient taking po's without difficulty. Report called to Kyung Bacca RN with plan reviewed. Pt to be flat until 1230

## 2017-07-23 NOTE — Care Management (Signed)
Admitted with nstemi.  Attempted PCI which was unsuccessful. Contacting interventionalist in [place] to discuss chronic total occlusion PCI. At present, it is not thought patient requires acute to acute transfer - that this can be pursued as outpatient if remains stable

## 2017-07-23 NOTE — Care Management Important Message (Signed)
Important Message  Patient Details  Name: George Doyle MRN: [id number] Date of Birth: Nov 20, 1950   Medicare Important Message Given:  Yes  Signed IM notice given   Katrina Stack, RN 07/23/2017, 2:21 PM

## 2017-07-23 NOTE — Progress Notes (Signed)
ANTICOAGULATION CONSULT NOTE  Pharmacy Consult for Heparin Indication: chest pain/ACS  No Known Allergies  Patient Measurements: Height: 5\' 9"  (175.3 cm) Weight: 199 lb 8 oz (90.5 kg) IBW/kg (Calculated) : 70.7 Heparin Dosing Weight: 89.1 kg  Vital Signs: Temp: 97.8 F (36.6 C) (11/05 0523) Temp Source: Oral (11/05 0523) BP: 165/76 (11/05 0523) Pulse Rate: 68 (11/05 0523)  Labs: Recent Labs    07/20/17 1449 07/20/17 1545  07/21/17 0306 07/21/17 0549 07/21/17 0826 07/22/17 0436 07/23/17 0534  HGB 14.5  --   --  14.2  --   --  14.0 14.3  HCT 43.2  --   --  41.4  --   --  40.9 42.9  PLT 204  --   --  189  --   --  203 214  APTT  --  29  --   --   --   --   --   --   LABPROT  --  12.9  --   --   --   --   --   --   INR  --  0.98  --   --   --   --   --   --   HEPARINUNFRC  --   --    < >  --  0.49  --  0.61 0.60  CREATININE 0.91  --   --   --   --   --  0.98 1.07  TROPONINI 10.67*  --    < > 14.21*  --  12.51* 12.05*  --    < > = values in this interval not displayed.    Estimated Creatinine Clearance: 73.5 mL/min (by C-G formula based on SCr of 1.07 mg/dL).   Medical History: Past Medical History:  Diagnosis Date  . Allergy   . Arthritis    knees, hands  . Hypertension   . Seizures ([place])    approx 25+ yrs ago.  Testing showed no cause. None since    Assessment: 66 y/o M admitted with CP and elevated troponin. Spoke with patient and he is not taking any anticoagulants.   Goal of Therapy:  Heparin level 0.3-0.7 units/ml Monitor platelets by anticoagulation protocol: Yes   Plan:  Give 4000 units bolus x 1 Start heparin infusion at 1250 units/hr Check anti-Xa level in 6 hours and daily while on heparin Continue to monitor H&H and platelets   11/3 0000 heparin level 0.68. Continue current regimen. Recheck in 6 hours to confirm. CBC with AM labs.  11/3 AM heparin level 0.49. Continue current regimen. Recheck heparin level and CBC with tomorrow AM  labs.  11/4 AM heparin level 0.61. Continue current regimen. Recheck heparin level and CBC with tomorrow AM labs.  11/5 AM heparin level 0.60. Continue current regimen. Recheck heparin level and CBC with tomorrow AM labs  Sim Boast, PharmD, BCPS  07/23/17 6:57 AM

## 2017-07-23 NOTE — Progress Notes (Signed)
Patient off unit for cardiac procedure. Will resume care upon return. George Doyle [facility]

## 2017-07-23 NOTE — Progress Notes (Signed)
Progress Note  Patient Name: George Doyle Date of Encounter: 07/23/2017  Primary Cardiologist: New to [place] discussion with patient prior to cardiac catheterization procedure, He was under the impression he would have radial access case with Dr. Jacqulyn Cane. Explained the scheduling circumstances to him Long discussion with patient and patient's wife who is anxious Eventually proceeded with cardiac catheterization    Inpatient Medications    Scheduled Meds: . aspirin  81 mg Oral Daily  . aspirin EC  81 mg Oral Daily  . atorvastatin  80 mg Oral q1800  . metoprolol tartrate  25 mg Oral BID  . nitroGLYCERIN  0.5 inch Topical Q6H  . ramipril  20 mg Oral Daily  . sodium chloride flush  3 mL Intravenous Q12H   Continuous Infusions: . sodium chloride 1 mL/kg/hr (07/23/17 1248)   PRN Meds: acetaminophen, acetaminophen, nitroGLYCERIN, ondansetron (ZOFRAN) IV, ondansetron (ZOFRAN) IV   Vital Signs    Vitals:   07/23/17 1100 07/23/17 1115 07/23/17 1130 07/23/17 1145  BP: (!) 145/82 (!) 149/89 (!) 155/83   Pulse: 62 65 63 70  Resp: 13 14 17  (!) 22  Temp:      TempSrc:      SpO2: 96% 98% 99% 98%  Weight:      Height:        Intake/Output Summary (Last 24 hours) at 07/23/2017 1303 Last data filed at 07/23/2017 0700 Gross per 24 hour  Intake 1025.83 ml  Output 1625 ml  Net -599.17 ml   Filed Weights   07/22/17 0445 07/23/17 0523 07/23/17 0724  Weight: 199 lb 8 oz (90.5 kg) 200 lb 3.2 oz (90.8 kg) 200 lb (90.7 kg)    Telemetry    NSR - Personally Reviewed  ECG     Physical Exam   GEN: No acute distress.   Neck: No JVD Cardiac: RRR, no murmurs, rubs, or gallops.  Respiratory: Clear to auscultation bilaterally. GI: Soft, nontender, non-distended  MS: No edema; No deformity. Neuro:  Nonfocal  Psych: Normal affect   Labs    Chemistry Recent Labs  Lab 07/20/17 1449 07/22/17 0436 07/23/17 0534  NA 135 137 137  K 3.4*  3.7 3.7  CL 100* 105 104  CO2 28 26 27   GLUCOSE 97 103* 100*  BUN 16 16 19   CREATININE 0.91 0.98 1.07  CALCIUM 9.2 8.7* 8.8*  GFRNONAA >60 >60 >60  GFRAA >60 >60 >60  ANIONGAP 7 6 6      Hematology Recent Labs  Lab 07/21/17 0306 07/22/17 0436 07/23/17 0534  WBC 10.8* 10.0 9.6  RBC 4.61 4.55 4.79  HGB 14.2 14.0 14.3  HCT 41.4 40.9 42.9  MCV 89.8 89.8 89.5  MCH 30.8 30.7 29.8  MCHC 34.3 34.2 33.3  RDW 13.9 13.5 13.7  PLT 189 203 214    Cardiac Enzymes Recent Labs  Lab 07/20/17 2040 07/21/17 0306 07/21/17 0826 07/22/17 0436  TROPONINI 12.97* 14.21* 12.51* 12.05*   No results for input(s): TROPIPOC in the last 168 hours.   BNPNo results for input(s): BNP, PROBNP in the last 168 hours.   DDimer No results for input(s): DDIMER in the last 168 hours.   Radiology    No results found.  Cardiac Studies   Cardiac catheterization as detailed below  Patient Profile     66 y.o. male presenting with chest pain Friday night November 2 troponin up to 14 Initial severe chest pain was on July 16, 2017 Stuttering chest  pain through the week No severe chest pain over the weekend waiting for cardiac catheterization today  Assessment & Plan    A/P: 1) NSTEMI  Cardiac catheterization showed  occluded right coronary artery proximal region Collaterals from left to right Moderate to severe disease of tortuous calcified left circumflex vessel Decision made to attempt intervention on the right  Discussed case with Dr. Clayborn Bigness. Intervention was attempted but unsuccessful Case discussed again, plan was made to discuss this case with interventionalist in [facility] for consideration of CTO intervention  Above was explained to the patient He is currently pain-free Messages have been sent to [facility] We will likely need to be arranged as an outpatient as he is pain-free, stable cardiac function No indication for transfer We will keep on aspirin and Plavix,  beta-blocker and ACE inhibitor  2) hyperlipidemia Order changed to start Lipitor 80 mg daily  3) hypertension Stable at this time   Total encounter time more than 60 minutes  Greater than 50% was spent in counseling and coordination of care with the patient   Signed, Esmond Plants, MD, Ph.D [facility]   For questions or updates, please contact [place] Please consult [email or web address] for contact info under Cardiology/STEMI.      Signed, Ida Rogue, MD  07/23/2017, 1:03 PM

## 2017-07-23 NOTE — Progress Notes (Signed)
[place] [facility] [place] NAME: George Doyle    MR#:  [id number]  DATE OF BIRTH:  Apr 28, 1951  SUBJECTIVE:  CHIEF COMPLAINT:   Chief Complaint  Patient presents with  . Chest Pain  . Shortness of Breath   - feels well, no chest pain - s/p cardiac cath and hemostat in right groin- no bleeding  REVIEW OF SYSTEMS:  Review of Systems  Constitutional: Negative for chills, fever and malaise/fatigue.  HENT: Negative for congestion, ear discharge, hearing loss and nosebleeds.   Eyes: Negative for blurred vision, double vision and photophobia.  Respiratory: Negative for cough, shortness of breath and wheezing.   Cardiovascular: Negative for chest pain, palpitations and leg swelling.  Gastrointestinal: Negative for abdominal pain, constipation, diarrhea, nausea and vomiting.  Genitourinary: Negative for dysuria.  Musculoskeletal: Negative for back pain and myalgias.  Neurological: Negative for dizziness, speech change, focal weakness, seizures and headaches.    DRUG ALLERGIES:  No Known Allergies  VITALS:  Blood pressure (!) 155/83, pulse 70, temperature 98.2 F (36.8 C), temperature source Oral, resp. rate (!) 22, height 5\' 9"  (1.753 m), weight 90.7 kg (200 lb), SpO2 98 %.  PHYSICAL EXAMINATION:  Physical Exam  GENERAL:  66 y.o.-year-old patient lying in the bed with no acute distress.  EYES: Pupils equal, round, reactive to light and accommodation. No scleral icterus. Extraocular muscles intact.  HEENT: Head atraumatic, normocephalic. Oropharynx and nasopharynx clear.  NECK:  Supple, no jugular venous distention. No thyroid enlargement, no tenderness.  LUNGS: Normal breath sounds bilaterally, no wheezing, rales,rhonchi or crepitation. No use of accessory muscles of respiration.  CARDIOVASCULAR: S1, S2 normal. No murmurs, rubs, or gallops.  ABDOMEN: Soft, nontender, nondistended. Bowel sounds present. No organomegaly or mass.    EXTREMITIES: No pedal edema, cyanosis, or clubbing. Right groin- hemostat in place NEUROLOGIC: Cranial nerves II through XII are intact. Muscle strength 5/5 in all extremities. Sensation intact. Gait not checked.  PSYCHIATRIC: The patient is alert and oriented x 3.  SKIN: No obvious rash, lesion, or ulcer.    LABORATORY PANEL:   CBC Recent Labs  Lab 07/23/17 0534  WBC 9.6  HGB 14.3  HCT 42.9  PLT 214   ------------------------------------------------------------------------------------------------------------------  Chemistries  Recent Labs  Lab 07/23/17 0534  NA 137  K 3.7  CL 104  CO2 27  GLUCOSE 100*  BUN 19  CREATININE 1.07  CALCIUM 8.8*   ------------------------------------------------------------------------------------------------------------------  Cardiac Enzymes Recent Labs  Lab 07/22/17 0436  TROPONINI 12.05*   ------------------------------------------------------------------------------------------------------------------  RADIOLOGY:  No results found.  EKG:   Orders placed or performed during the hospital encounter of 07/20/17  . ED EKG within 10 minutes  . ED EKG within 10 minutes  . EKG 12-Lead  . EKG 12-Lead  . EKG 12-lead  . EKG 12-Lead  . EKG 12-Lead  . EKG 12-Lead  . EKG 12-Lead    ASSESSMENT AND PLAN:   66 year old male with past medical history significant for hypertension and former smoking presents with chest pain and elevated troponin  #1 NSTEMI-  - appreciate cardiology consult -Status post cardiac catheterization showing 100% chronic RCA occlusion with collaterals, unable to do PCI. Patient also has some other occlusive lesions. Mild inferior hypokinesis noted as well. -Recommended medical management and outpatient cardiology follow up. - asa, statin, Plavix -Also started on low-dose metoprolol and Altace -Off heparin drip today -Echocardiogram with slight LVH, EF of 50-55%. No wall motion abnormalities. -Cardiac  rehabilitation at discharge  #  2 DVT prophylaxis- will start lovenox   Ambulate later today   All the records are reviewed and case discussed with Care Management/Social Workerr. Management plans discussed with the patient, family and they are in agreement.  CODE STATUS: Full Code  TOTAL TIME TAKING CARE OF THIS PATIENT: 36 minutes.   POSSIBLE D/C TOMORROW, DEPENDING ON CLINICAL CONDITION.   Gladstone Lighter M.D on 07/23/2017 at 1:07 PM  Between 7am to 6pm - Pager - [phone]  After 6pm go to [email or web address] - password [email or web address] [facility]  Office  [phone]  CC: Primary care physician; Juline Patch, MD

## 2017-07-23 NOTE — Plan of Care (Signed)
Will continue to monitor and assess

## 2017-07-23 NOTE — Plan of Care (Signed)
  Physical Regulation: Ability to maintain clinical measurements within normal limits will improve 07/23/2017 1443 - Not Progressing by Daron Offer, RN Note Troponin level times at least three have been elevated, last one = 12.1. Patient had heart catheterization today. At least one 100% blockage. Will continue to monitor. Wenda Low [facility]

## 2017-07-24 ENCOUNTER — Other Ambulatory Visit: Payer: Self-pay

## 2017-07-24 ENCOUNTER — Other Ambulatory Visit: Payer: Self-pay | Admitting: *Deleted

## 2017-07-24 ENCOUNTER — Encounter: Payer: Self-pay | Admitting: Cardiovascular Disease

## 2017-07-24 ENCOUNTER — Telehealth: Payer: Self-pay | Admitting: Cardiovascular Disease

## 2017-07-24 LAB — CBC
HCT: 37.7 % — ABNORMAL LOW (ref 40.0–52.0)
HEMOGLOBIN: 12.8 g/dL — AB (ref 13.0–18.0)
MCH: 30.6 pg (ref 26.0–34.0)
MCHC: 34 g/dL (ref 32.0–36.0)
MCV: 90 fL (ref 80.0–100.0)
PLATELETS: 197 10*3/uL (ref 150–440)
RBC: 4.2 MIL/uL — AB (ref 4.40–5.90)
RDW: 13.6 % (ref 11.5–14.5)
WBC: 10.1 10*3/uL (ref 3.8–10.6)

## 2017-07-24 MED ORDER — METOPROLOL TARTRATE 25 MG PO TABS: 25.0000 mg | ORAL_TABLET | Freq: Two times a day (BID) | ORAL | 2 refills | Status: DC

## 2017-07-24 MED ORDER — NITROGLYCERIN 0.4 MG SL SUBL: 0.4000 mg | SUBLINGUAL_TABLET | SUBLINGUAL | 2 refills | Status: DC | PRN

## 2017-07-24 MED ORDER — ATORVASTATIN CALCIUM 80 MG PO TABS: 80.0000 mg | ORAL_TABLET | Freq: Every day | ORAL | 2 refills | Status: DC

## 2017-07-24 MED ORDER — CLOPIDOGREL BISULFATE 75 MG PO TABS: 75.0000 mg | ORAL_TABLET | Freq: Every day | ORAL | 2 refills | Status: DC

## 2017-07-24 MED ORDER — ASPIRIN 81 MG PO TBEC: 81.0000 mg | DELAYED_RELEASE_TABLET | Freq: Every day | ORAL | 1 refills | Status: AC

## 2017-07-24 NOTE — Progress Notes (Signed)
Pt given discharge instructions and education on any new medications. IV taken out, tele off. Pt had no complaints. Transported via wheelchair to the car.

## 2017-07-24 NOTE — Progress Notes (Signed)
Progress Note  Patient Name: George Doyle Date of Encounter: 07/24/2017  Primary Cardiologist: New to [place] on 07/23/2017 for NSTEMI with a peak troponin of 14.21. LHC showed CTO of the RCA with collaterals, 60% LCx stenosis of 99% post atrio lesion. Medical management advised. Mild systolic dysfunction on TTE. No further chest pain. No SOB. Tolerating medications without issues. We have reached out to [facility] team for possible intervention on CTO RCA. Has ambulated without issues. Post-cath labs stable.   Inpatient Medications    Scheduled Meds: . aspirin  81 mg Oral Daily  . aspirin EC  81 mg Oral Daily  . atorvastatin  80 mg Oral q1800  . clopidogrel  75 mg Oral Daily  . enoxaparin (LOVENOX) injection  40 mg Subcutaneous Q24H  . metoprolol tartrate  25 mg Oral BID  . nitroGLYCERIN  0.5 inch Topical Q6H  . ramipril  20 mg Oral Daily  . sodium chloride flush  3 mL Intravenous Q12H   Continuous Infusions:  PRN Meds: acetaminophen, acetaminophen, nitroGLYCERIN, ondansetron (ZOFRAN) IV, ondansetron (ZOFRAN) IV   Vital Signs    Vitals:   07/23/17 1609 07/23/17 1939 07/24/17 0032 07/24/17 0356  BP: (!) 159/85 (!) 141/87 122/75 139/63  Pulse: 71 77 70 73  Resp: 19 17 17 17   Temp:  (!) 97.5 F (36.4 C) 98.5 F (36.9 C) 98.4 F (36.9 C)  TempSrc:  Oral Oral Oral  SpO2: 100% 97% 96% 98%  Weight:      Height:        Intake/Output Summary (Last 24 hours) at 07/24/2017 0838 Last data filed at 07/24/2017 0500 Gross per 24 hour  Intake 2123.46 ml  Output 1625 ml  Net 498.46 ml   Filed Weights   07/22/17 0445 07/23/17 0523 07/23/17 0724  Weight: 199 lb 8 oz (90.5 kg) 200 lb 3.2 oz (90.8 kg) 200 lb (90.7 kg)    Telemetry    NSR - Personally Reviewed  ECG    n/a - Personally Reviewed  Physical Exam   GEN: No acute distress.   Neck: No JVD. Cardiac: RRR, no murmurs, rubs, or gallops. Right femoral cardiac cath site with mild  bruising noted. No active bleeding or TTP. No erythema. No bruit.  Respiratory: Clear to auscultation bilaterally.  GI: Soft, nontender, non-distended.   MS: No edema; No deformity. Neuro:  Alert and oriented x 3; Nonfocal.  Psych: Normal affect.  Labs    Chemistry Recent Labs  Lab 07/20/17 1449 07/22/17 0436 07/23/17 0534  NA 135 137 137  K 3.4* 3.7 3.7  CL 100* 105 104  CO2 28 26 27   GLUCOSE 97 103* 100*  BUN 16 16 19   CREATININE 0.91 0.98 1.07  CALCIUM 9.2 8.7* 8.8*  GFRNONAA >60 >60 >60  GFRAA >60 >60 >60  ANIONGAP 7 6 6      Hematology Recent Labs  Lab 07/22/17 0436 07/23/17 0534 07/24/17 0400  WBC 10.0 9.6 10.1  RBC 4.55 4.79 4.20*  HGB 14.0 14.3 12.8*  HCT 40.9 42.9 37.7*  MCV 89.8 89.5 90.0  MCH 30.7 29.8 30.6  MCHC 34.2 33.3 34.0  RDW 13.5 13.7 13.6  PLT 203 214 197    Cardiac Enzymes Recent Labs  Lab 07/20/17 2040 07/21/17 0306 07/21/17 0826 07/22/17 0436  TROPONINI 12.97* 14.21* 12.51* 12.05*   No results for input(s): TROPIPOC in the last 168 hours.   BNPNo results for input(s): BNP, PROBNP in the last  168 hours.   DDimer No results for input(s): DDIMER in the last 168 hours.   Radiology    No results found.  Cardiac Studies   TTE 07/21/2017: Study Conclusions  - Left ventricle: The cavity size was normal. There was mild   concentric hypertrophy. Systolic function was normal. The   estimated ejection fraction was in the range of 50% to 55%.   Hypokinesis of the inferior myocardium. Doppler parameters are   consistent with abnormal left ventricular relaxation (grade 1   diastolic dysfunction). - Aorta: Aortic root was mildly dilated, 3.6 cm - Ascending aorta: The ascending aorta was mildly dilated, 4.1 cm - Mitral valve: There was mild regurgitation. - Right ventricle: Systolic function was normal. - Pulmonary arteries: Systolic pressure could not be accurately   estimated.  LHC 07/23/2017: Coronary Findings   Diagnostic    Dominance: Right  Left Circumflex  Prox Cx to Mid Cx lesion 60% stenosed  Prox Cx to Mid Cx lesion is 60% stenosed.  Right Coronary Artery  Prox RCA to Dist RCA lesion 100% stenosed  Prox RCA to Dist RCA lesion is 100% stenosed.  Right Posterior Descending Artery  Collaterals  RPDA filled by collaterals from Dist LAD.    Right Posterior Atrioventricular Branch  Collaterals  Post Atrio filled by collaterals from Dist Cx.    Collaterals  Post Atrio filled by collaterals from 3rd Mrg.    Collaterals  Post Atrio filled by collaterals from 3rd Sept.    Post Atrio lesion 99% stenosed  Post Atrio lesion is 99% stenosed.  Intervention   No interventions have been documented.  Wall Motion   Resting               Coronary Diagrams   Diagnostic Diagram        Recommendations:  Case discussed with Dr. Clayborn Bigness He will attempt PCI to the RCA, Medical management for now of LCX given tortuous nature of vessel  Dr. Clayborn Bigness unable to advance catheter past RCA occlusion. Medical management advised with evaluation in [id number] for possible intervention of the RCA CTO.   Patient Profile     66 y.o. male with history of HTN and seizure disorder who was admitted to [facility] with NSTEMI. LHC showed CTO of RCA with medical management being advised for now.   Assessment & Plan    1. NSTEMI: -Without chest pain -Troponin peaked at 14.21 -LHC 07/23/2017 showed CTO of the RCA with left to right collaterals and nonobstructive disease of the LCx -Dr. Clayborn Bigness unable to intervene on the RCA -Medical management advised for now -Films have been sent to [facility] team for evaluation of possible CTO intervention at [facility], awaiting to hear back -Continue DAPT with ASA 81 mg daily and Plavix 75 mg daily -Add SL NTG prn -Continue metoprolol, Lipitor, and ramipril  -Post cath instructions covered in detail -Cardiac rehab as an outpatient   2. Systolic dysfunction: -He does not appear volume up at this  time -Lopressor as above -Ramipril as above -CHF education   3. HLD: -Lipitor  4. HTN: -Stable -Continue meds as above  For questions or updates, please contact [place] Please consult [email or web address] for contact info under Cardiology/STEMI.    Signed, Christell Faith, PA-C [place] Pager: [phone] 07/24/2017, 8:38 AM

## 2017-07-24 NOTE — Telephone Encounter (Signed)
Patient currently admitted at this time. 

## 2017-07-24 NOTE — Discharge Summary (Signed)
[facility] [place] NAME: George Doyle    MR#:  [id number]  DATE OF BIRTH:  10/16/1950  DATE OF ADMISSION:  07/20/2017   ADMITTING PHYSICIAN: Max Sane, MD  DATE OF DISCHARGE: 07/24/2017 11:18 AM  PRIMARY CARE PHYSICIAN: Juline Patch, MD   ADMISSION DIAGNOSIS:   chest pain;shortness of breath  DISCHARGE DIAGNOSIS:   Active Problems:   NSTEMI (non-ST elevated myocardial infarction) ([place])   SECONDARY DIAGNOSIS:   Past Medical History:  Diagnosis Date  . Allergy   . Arthritis    knees, hands  . Hypertension   . Seizures ([place])    approx 25+ yrs ago.  Testing showed no cause. None since    HOSPITAL COURSE:   66 year old male with past medical history significant for hypertension and former smoking presents with chest pain and elevated troponin  #1 NSTEMI-  - appreciate cardiology consult -Status post cardiac catheterization showing 100% chronic RCA occlusion with collaterals, unable to do PCI. Patient also has some other occlusive lesions. Mild inferior hypokinesis noted as well. -Recommended medical management and outpatient cardiology follow up for chronic total occlusion. - asa, statin, Plavix -Also started on low-dose metoprolol and Altace -Echocardiogram with slight LVH, EF of 50-55%. No wall motion abnormalities. -Cardiac rehabilitation at discharge  Patient is doing well and will be discharged today    DISCHARGE CONDITIONS:   Guarded  CONSULTS OBTAINED:   Treatment Team:  Wellington Hampshire, MD Burnell Blanks, MD  DRUG ALLERGIES:   No Known Allergies DISCHARGE MEDICATIONS:   Allergies as of 07/24/2017   No Known Allergies     Medication List    STOP taking these medications   hydrochlorothiazide 12.5 MG capsule Commonly known as:  MICROZIDE     TAKE these medications   aspirin 81 MG EC tablet Take 1 tablet (81 mg total) daily by mouth.   atorvastatin 80 MG tablet Commonly  known as:  LIPITOR Take 1 tablet (80 mg total) daily at 6 PM by mouth.   clopidogrel 75 MG tablet Commonly known as:  PLAVIX Take 1 tablet (75 mg total) daily by mouth.   Fish Oil 1000 MG Caps Take 1 capsule (1,000 mg total) by mouth 2 (two) times daily.   metoprolol tartrate 25 MG tablet Commonly known as:  LOPRESSOR Take 1 tablet (25 mg total) 2 (two) times daily by mouth.   nitroGLYCERIN 0.4 MG SL tablet Commonly known as:  NITROSTAT Place 1 tablet (0.4 mg total) every 5 (five) minutes x 3 doses as needed under the tongue for chest pain.   ramipril 10 MG capsule Commonly known as:  ALTACE Take 2 capsules (20 mg total) by mouth daily.        DISCHARGE INSTRUCTIONS:   1. Cardiology follow up in 1-2 weeks 2. Cardiac rehabilitation 3. PCP follow-up in 2 weeks  DIET:   Cardiac diet  ACTIVITY:   Activity as tolerated  OXYGEN:   Home Oxygen: No.  Oxygen Delivery: room air  DISCHARGE LOCATION:   home   If you experience worsening of your admission symptoms, develop shortness of breath, life threatening emergency, suicidal or homicidal thoughts you must seek medical attention immediately by calling 911 or calling your MD immediately  if symptoms less severe.  You Must read complete instructions/literature along with all the possible adverse reactions/side effects for all the Medicines you take and that have been prescribed to you. Take any new Medicines after you have  completely understood and accpet all the possible adverse reactions/side effects.   Please note  You were cared for by a hospitalist during your hospital stay. If you have any questions about your discharge medications or the care you received while you were in the hospital after you are discharged, you can call the unit and asked to speak with the hospitalist on call if the hospitalist that took care of you is not available. Once you are discharged, your primary care physician will handle any further  medical issues. Please note that NO REFILLS for any discharge medications will be authorized once you are discharged, as it is imperative that you return to your primary care physician (or establish a relationship with a primary care physician if you do not have one) for your aftercare needs so that they can reassess your need for medications and monitor your lab values.    On the day of Discharge:  VITAL SIGNS:   Blood pressure 140/66, pulse 74, temperature 98.1 F (36.7 C), temperature source Oral, resp. rate 18, height 5\' 9"  (1.753 m), weight 90.7 kg (200 lb), SpO2 97 %.  PHYSICAL EXAMINATION:    GENERAL:  66 y.o.-year-old patient lying in the bed with no acute distress.  EYES: Pupils equal, round, reactive to light and accommodation. No scleral icterus. Extraocular muscles intact.  HEENT: Head atraumatic, normocephalic. Oropharynx and nasopharynx clear.  NECK:  Supple, no jugular venous distention. No thyroid enlargement, no tenderness.  LUNGS: Normal breath sounds bilaterally, no wheezing, rales,rhonchi or crepitation. No use of accessory muscles of respiration.  CARDIOVASCULAR: S1, S2 normal. No murmurs, rubs, or gallops.  ABDOMEN: Soft, nontender, nondistended. Bowel sounds present. No organomegaly or mass.  EXTREMITIES: No pedal edema, cyanosis, or clubbing. Right groin minimal ecchymosis noted around at the catheterization site. Also has some soreness. NEUROLOGIC: Cranial nerves II through XII are intact. Muscle strength 5/5 in all extremities. Sensation intact. Gait not checked.  PSYCHIATRIC: The patient is alert and oriented x 3.  SKIN: No obvious rash, lesion, or ulcer.      DATA REVIEW:   CBC Recent Labs  Lab 07/24/17 0400  WBC 10.1  HGB 12.8*  HCT 37.7*  PLT 197    Chemistries  Recent Labs  Lab 07/23/17 0534  NA 137  K 3.7  CL 104  CO2 27  GLUCOSE 100*  BUN 19  CREATININE 1.07  CALCIUM 8.8*     Microbiology Results  No results found for this or any  previous visit.  RADIOLOGY:  No results found.   Management plans discussed with the patient, family and they are in agreement.  CODE STATUS:  Code Status History    Date Active Date Inactive Code Status Order ID Comments User Context   07/20/2017 19:37 07/24/2017 14:24 Full Code [id number]  Max Sane, MD Inpatient    Advance Directive Documentation     Most Recent Value  Type of Advance Directive  Healthcare Power of Attorney, Living will  Pre-existing out of facility DNR order (yellow form or pink MOST form)  No data  "MOST" Form in Place?  No data      TOTAL TIME TAKING CARE OF THIS PATIENT: 37 minutes.    Gladstone Lighter M.D on 07/24/2017 at 3:44 PM  Between 7am to 6pm - Pager - [phone]  After 6pm go to [email or web address] - Proofreader  [facility]  Office  [phone]  CC: Primary care physician; Juline Patch, MD   Note: This dictation was  prepared with Dragon dictation along with smaller phrase technology. Any transcriptional errors that result from this process are unintentional.

## 2017-07-24 NOTE — Progress Notes (Signed)
Attempted to round on patient again.  Patient sitting up in chair visiting with son.  Wife also present at bedside.  After introducing myself patient asked me to return in an hour or hour and half to as he needed to drink his coffee, talk with his son, and go to the restroom.  Patient stated he did not get a lot of rest last night and just needed a little time before speaking with me.  I informed patient I would try to return in one to two hours.    Roanna Epley, RN, BSN, [facility] Cardiovascular and Pulmonary Nurse Navigator

## 2017-07-24 NOTE — Telephone Encounter (Signed)
tcm [facility] for cath with [facility]  Needs 1 week fu  Scheduled 11/27 berge next available  Added to waitlist

## 2017-07-24 NOTE — Progress Notes (Addendum)
Patient referred to Cardiac Rehab with dx of NSTEMI. Patient with hx of HTN and former smoker.  Patient underwent cardiac cath revealing 100% chronic occlusion of RCA with collaterals.  Unable to perform PCI.  Plan to medically manage with outpatient cardiology follow-up.  Patient on ASA, Plavix, Statin, metoprolol and Altace.  Echo performed this admission with EF of 50 - 55%.   "Heart Attack Bouncing Back" booklet given and reviewed with patient and wife.  Discussed modifiable risk factors including controlling blood pressure, cholesterol, and blood sugar; following heart healthy diet; maintaining healthy weight; exercise; and smoking cessation if applicable.  Note:  Patient is a  former smoker.    Discussed cardiac medications including rationale for taking, mechanisms of action, and  side effects.    Discussed emergency plan for heart attack symptoms.  Discussed when to use NTG.  Patient verbalized understanding of need to call 911 and not to drive himself to ER if having cardiac symptoms / chest pain.    Heart healthy diet of low sodium, low fat, low cholesterol heart healthy diet discussed.  Information on diet provided.    Exercise - Patient does not currently exercise.  Benefits of exercise discussed.  Patient and wife acknowledged that need to exercise / walk.  Discussed walking plan and gradually increasing time to 30 minutes  per day for minimum of 150 minutes per week.  Encouraged patient to start out slow; divide walking time into twice a day if necessary at first until one can walk 30 minutes per day.  Explained to patient and family patient had been referred to Cardiac Rehab.  An overview of the program was provided.  Benefits of participating in the program were discussed.  Patient and wife request that we contact patient in approximately two weeks, as they are waiting to hear from cardiologists in [place] to see if he is a candidate for CTO PCI.  Cardiac Rehab brochure and informational  letter provided to patient and wife.   Patient stated he is trying to digest all of this, as it is a little overwhelming.   He did say he was in denial at first and did come to ER when he probably should have.  Wife stated that patient has shared with her that he plans to make some changes to move toward a healthier lifestyle.  Patient stated this has definitely been a wake up call.  Patient's primary support person is his wife and they have a daughter, who is a Marine scientist.    Patient and wife asked pertinent questions and verbalized appreciation for the information provided.    Roanna Epley, RN, BSN, [facility] Cardiovascular and Pulmonary Nurse Navigator

## 2017-07-26 NOTE — Telephone Encounter (Signed)
Attempted TCM call. Left message on machine for patient to contact the office.

## 2017-07-27 NOTE — Telephone Encounter (Signed)
Patient scheduled for earlier appointment with Dr. Rockey Situ on 08/01/17. Patient confirmed.

## 2017-07-27 NOTE — Telephone Encounter (Signed)
Patient contacted regarding discharge from [facility] on 07/24/17.  Patient understands to follow up with provider Ignacia Bayley, NP on 11/27 at 2pm at [facility]. Patient understands discharge instructions? yes Patient understands medications and regiment? yes Patient understands to bring all medications to this visit? yes  S/w wife. Metoprolol 25mg  newly prescribed. Pt experiencing hypotension.   11/7 BP 98/46 HR 76. Took metoprolol as prescribed.  11/8 BP 102/42 HR 64. He did not take either dose of metoprolol.  11/9 BP146/87 HR 72 before medications.  He took 1/2 tablet metoprolol along with ramipril. Will recheck BP at 12:30pm.  Pt's daughter, Jerene Pitch, is a Marine scientist and has advised pt to reduce metoprolol dosage to 12.5mg  BID until BP improves. He understands to hold scheduled dose if SBP <100.  I have put pt on wait list for sooner appt.

## 2017-07-31 NOTE — Progress Notes (Signed)
Cardiology Office Note  Date:  08/01/2017   ID:  George Doyle, DOB 1950/10/19, MRN [id number]  PCP:  Juline Patch, MD   Chief Complaint  Patient presents with  . other    [facility] follow up. Patient states he has a little SOB. Patient deneis chest pain. Meds reviewed verbally with patient.    HPI:  Mr. Emberson is a pleasant 66 yo male with history of  HTN  former tobacco abuse admitted with chest pain 07/21/2017 NSTEMI, TNT 14 present on/off for 5 days prior to admission, Cath 07/23/2017   Occluded proximal RCA, collaterals from left to right, Moderate to severe mid Left circumflex dz, tortuous vessel, calcified Moderate inferior wall hypokinesis He presents for follow-up after recent hospitalization  In follow-up today he is doing well overall, still fatigued, recovering Walking short distances without chest pain, has not started full exercise program Reports having little bit of shortness of breath when in the hot shower  Previous results reviewed with him  attempt PCI to the RCA was unsuccessful, possibly went subintimal  reports blood pressure is stable, no orthostasis symptoms  Echo performed in the hospital ejection fraction was in the range of 50% to 55%. Aorta: Aortic root was mildly dilated, 3.6 cm - Ascending aorta: The ascending aorta was mildly dilated, 4.1 cm  Tolerating Lipitor, metoprolol, Plavix, ramipril, aspirin  EKG personally reviewed by myself on todays visit Shows normal sinus rhythm rate 60 bpm ST abnormality V3 through V6, 1 and aVL old inferior MI   PMH:   has a past medical history of Allergy, Arthritis, Hypertension, and Seizures ([place]).  PSH:    Past Surgical History:  Procedure Laterality Date  . COLONOSCOPY  2011   cleared for 10 yrs  . HERNIA REPAIR    . KNEE SURGERY Bilateral    2 on R) 1 on L)    Current Outpatient Medications  Medication Sig Dispense Refill  . aspirin EC 81 MG EC tablet Take 1 tablet (81 mg total)  daily by mouth. 30 tablet 1  . atorvastatin (LIPITOR) 80 MG tablet Take 1 tablet (80 mg total) daily at 6 PM by mouth. 90 tablet 3  . clopidogrel (PLAVIX) 75 MG tablet Take 1 tablet (75 mg total) daily by mouth. 90 tablet 3  . metoprolol tartrate (LOPRESSOR) 25 MG tablet Take 1 tablet (25 mg total) 2 (two) times daily by mouth. 180 tablet 3  . nitroGLYCERIN (NITROSTAT) 0.4 MG SL tablet Place 1 tablet (0.4 mg total) every 5 (five) minutes x 3 doses as needed under the tongue for chest pain. 30 tablet 2  . ramipril (ALTACE) 10 MG capsule Take 2 capsules (20 mg total) daily by mouth. 180 capsule 3   No current facility-administered medications for this visit.      Allergies:   Patient has no known allergies.   Social History:  The patient  reports that he has quit smoking. he has never used smokeless tobacco. He reports that he drinks alcohol. He reports that he does not use drugs.   Family History:   family history includes CAD in his father.    Review of Systems: Review of Systems  Constitutional: Positive for malaise/fatigue.  Respiratory: Positive for shortness of breath.   Cardiovascular: Negative.   Gastrointestinal: Negative.   Musculoskeletal: Negative.   Neurological: Negative.   Psychiatric/Behavioral: Negative.   All other systems reviewed and are negative.    PHYSICAL EXAM: VS:  BP 130/68 (BP Location: Left Arm,  Patient Position: Sitting, Cuff Size: Normal)   Pulse 60   Ht 5\' 9"  (1.753 m)   Wt 206 lb 8 oz (93.7 kg)   BMI 30.49 kg/m  , BMI Body mass index is 30.49 kg/m. GEN: Well nourished, well developed, in no acute distress  HEENT: normal  Neck: no JVD, carotid bruits, or masses Cardiac: RRR; no murmurs, rubs, or gallops,no edema  Respiratory:  clear to auscultation bilaterally, normal work of breathing GI: soft, nontender, nondistended, + BS MS: no deformity or atrophy  Skin: warm and dry, no rash Neuro:  Strength and sensation are intact Psych: euthymic  mood, full affect    Recent Labs: 07/23/2017: BUN 19; Creatinine, Ser 1.07; Potassium 3.7; Sodium 137 07/24/2017: Hemoglobin 12.8; Platelets 197    Lipid Panel Lab Results  Component Value Date   CHOL 202 (H) 05/10/2017   HDL 55 05/10/2017   LDLCALC 123 (H) 05/10/2017   TRIG 118 05/10/2017      Wt Readings from Last 3 Encounters:  08/01/17 206 lb 8 oz (93.7 kg)  07/23/17 200 lb (90.7 kg)  05/10/17 204 lb (92.5 kg)       ASSESSMENT AND PLAN:  NSTEMI (non-ST elevated myocardial infarction) ([place]) - Plan: EKG 12-Lead Discussed catheterization findings with him He has appointment in [facility] December 17 at 2 PM to discuss intervention for CTO. He is hoping for cancellation and earlier appointment if possible We will send a note to [place] current medications, order placed for cardiac rehab  Essential hypertension - Plan: ramipril (ALTACE) 10 MG capsule  CAD, multiple vessel Moderate to severe left circumflex disease tortuous vessel, occluded RCA Currently with stable angina symptoms  Mixed hyperlipidemia Plan on checking lipids in 2 months time Tolerating Lipitor 80 mg daily Was previously not on a statin  Disposition:   F/U  3 months   Total encounter time more than 45 minutes  Greater than 50% was spent in counseling and coordination of care with the patient    Orders Placed This Encounter  Procedures  . AMB referral to cardiac rehabilitation  . EKG 12-Lead     Signed, Esmond Plants, M.D., Ph.D. 08/01/2017  [place], [place]

## 2017-08-01 ENCOUNTER — Encounter: Payer: Self-pay | Admitting: Cardiovascular Disease

## 2017-08-01 ENCOUNTER — Ambulatory Visit (INDEPENDENT_AMBULATORY_CARE_PROVIDER_SITE_OTHER): Payer: Medicare HMO | Admitting: Cardiovascular Disease

## 2017-08-01 ENCOUNTER — Telehealth: Payer: Self-pay | Admitting: Cardiovascular Disease

## 2017-08-01 VITALS — BP 130/68 | HR 60 | Ht 69.0 in | Wt 206.5 lb

## 2017-08-01 DIAGNOSIS — E782 Mixed hyperlipidemia: Secondary | ICD-10-CM | POA: Diagnosis not present

## 2017-08-01 DIAGNOSIS — I214 Non-ST elevation (NSTEMI) myocardial infarction: Secondary | ICD-10-CM

## 2017-08-01 DIAGNOSIS — I10 Essential (primary) hypertension: Secondary | ICD-10-CM

## 2017-08-01 DIAGNOSIS — I2510 Atherosclerotic heart disease of native coronary artery without angina pectoris: Secondary | ICD-10-CM | POA: Insufficient documentation

## 2017-08-01 DIAGNOSIS — Z79899 Other long term (current) drug therapy: Secondary | ICD-10-CM

## 2017-08-01 MED ORDER — CLOPIDOGREL BISULFATE 75 MG PO TABS: 75.0000 mg | ORAL_TABLET | Freq: Every day | ORAL | 3 refills | Status: DC

## 2017-08-01 MED ORDER — METOPROLOL TARTRATE 25 MG PO TABS: 25.0000 mg | ORAL_TABLET | Freq: Two times a day (BID) | ORAL | 3 refills | Status: DC

## 2017-08-01 MED ORDER — ATORVASTATIN CALCIUM 80 MG PO TABS: 80.0000 mg | ORAL_TABLET | Freq: Every day | ORAL | 3 refills | Status: DC

## 2017-08-01 MED ORDER — RAMIPRIL 10 MG PO CAPS: 20.0000 mg | ORAL_CAPSULE | Freq: Every day | ORAL | 3 refills | Status: DC

## 2017-08-01 NOTE — Telephone Encounter (Signed)
Left detailed voicemail message per release form that labs are to be done in January time frame by PCP and if not then call and we can enter labs to be done here with instructions to call back if any further questions.

## 2017-08-01 NOTE — Telephone Encounter (Signed)
Patient would like conformation on when and where to get labs  Please call to discuss

## 2017-08-01 NOTE — Patient Instructions (Addendum)
Cardiac Rehab for NSTEMI   Medication Instructions:   No medication changes made  Hold the fish oil  Labwork:  No new labs needed  Testing/Procedures:  No further testing at this time   Follow-Up: It was a pleasure seeing you in the office today. Please call us if you have new issues that need to be addressed before your next appt.  [phone]  Your physician wants you to follow-up in: 3 months.  You will receive a reminder letter in the mail two months in advance. If you don't receive a letter, please call our office to schedule the follow-up appointment..  Follow up:  Dr. Irish Lack 09/03/17 at 2:00 PM   [place] [id number] [phone]   If you need a refill on your cardiac medications before your next appointment, please call your pharmacy.

## 2017-08-02 ENCOUNTER — Telehealth: Payer: Self-pay | Admitting: Cardiovascular Disease

## 2017-08-02 NOTE — Telephone Encounter (Signed)
Please call wife.  See note below

## 2017-08-02 NOTE — Telephone Encounter (Signed)
error 

## 2017-08-02 NOTE — Telephone Encounter (Signed)
Spoke with patients wife per release form and she states that he is not going to be following up with his primary care provider and she wants [place] to schedule the labs to be done prior to his next office visit with [place]. I see where Dr. Rockey Situ mentioned Lipid panel but she wants to make sure there are not additional labs needed based on conversation that "we will check everything to see how medications are doing". Advised that I would check with Dr. Rockey Situ and then enter orders to be done prior to upcoming appointment in February and we would call to get them scheduled. She verbalized understanding with no further questions at this time.

## 2017-08-03 ENCOUNTER — Inpatient Hospital Stay: Payer: Medicare HMO | Admitting: Family Medicine

## 2017-08-04 NOTE — Telephone Encounter (Signed)
OK to order liver and lipids in Jan 2019 thx TG

## 2017-08-06 ENCOUNTER — Encounter: Payer: Self-pay | Admitting: Cardiovascular Disease

## 2017-08-06 NOTE — Telephone Encounter (Signed)
No answer. Left message to call back.   

## 2017-08-06 NOTE — Addendum Note (Signed)
Addended by: Vanessa Ralphs on: 08/06/2017 09:52 AM   Modules accepted: Orders

## 2017-08-06 NOTE — Telephone Encounter (Signed)
Pt wife is returning the call

## 2017-08-06 NOTE — Telephone Encounter (Signed)
Returned call to patient's wife. She verbalized understanding for patient to get lab work at [facility] in January 2019 and that patient would need to be fasting. Lab orders entered.

## 2017-08-06 NOTE — Telephone Encounter (Signed)
This encounter was created in error - please disregard.

## 2017-08-14 ENCOUNTER — Ambulatory Visit: Payer: Medicare HMO | Admitting: Nurse Practitioner

## 2017-08-16 ENCOUNTER — Telehealth: Payer: Self-pay | Admitting: *Deleted

## 2017-08-16 NOTE — Telephone Encounter (Signed)
Spoke with patients wife per release form and she confirmed appointment information with Dr. Irish Lack. She had no further questions at this time.

## 2017-08-16 NOTE — Telephone Encounter (Signed)
----- Message from Minna Merritts, MD sent at 08/15/2017 10:39 PM EST ----- Regarding: FW: CTO Can we make sure patient is aware thx TG  ----- Message ----- From: Blain Pais Sent: 07/31/2017   4:07 PM To: Jettie Booze, MD, Valora Corporal, RN, # Subject: RE: CTO                                        This pt is scheduled for Dr. Irish Lack on 12/17 at 2 pm ----- Message ----- From: Valora Corporal, RN Sent: 07/27/2017   4:46 PM To: Rebeca Alert [place] Support Pool Subject: FW: CTO                                        Could we get this patient in to be seen?   ----- Message ----- From: Minna Merritts, MD Sent: 07/25/2017  10:35 PM To: Jettie Booze, MD, Peter M Martinique, MD, # Subject: FW: CTO                                        To [place] triage  Please see message below  Can we get him an appointment with Casandra Doffing or Peter Martinique in [place] for office visit, discussion of opening his occluded right coronary artery vessel  they have reviewed his films. And need to discuss with him Please let  me know if appointment is scheduled way out  Thanks Ulice Dash and peter  thx Brantley Fling   ----- Message ----- From: Jettie Booze, MD Sent: 07/25/2017   3:54 AM To: Peter M Martinique, MD, Minna Merritts, MD Subject: [place] Alar: CTO                                        Tim,  I looked at the images.  There is a lot of calcium in the vessel which is a negative predictive factor.  He has disease all the way to the bifurcaton and there is severe PDA disease as well. THere are some favorable features as well.   I will have Collier Salina look and see what he thinks as well.   JV ----- Message ----- From: Minna Merritts, MD Sent: 07/23/2017  12:54 PM To: Jettie Booze, MD, Peter M Martinique, MD Subject: CTO                                            Mr. Dooling is a gentleman who came into [facility] Friday night November 2 Had severe pain  5 days earlier on the Monday I did cardiac catheterization today showing what appears to be CTO of the right Moderate to severe disease of tortuous left circumflex, moderate size vessel Case discussed with Dr. Clayborn Bigness, who attempted to open the right.  He tried several wires, was unsuccessful, did not want to push his luck.   When you have a second could you please review his films and let me know  if you think there might be a chance of opening the RCA? Wife and he are anxious, willing to proceed with intervention.  Currently pain-free.  Peak troponin up to 14  If it seems do able let me know if he should see you in clinic first or go straight for procedure in [facility]  My plan was to discharge him from the hospital or potentially today or tomorrow with close follow-up procedure  thx Esmond Plants Cell [phone]

## 2017-08-21 NOTE — Progress Notes (Signed)
Attempted to reach out to patient to see if he can come in earlier. Patient did not answer. Left message for patient to call back.

## 2017-08-22 NOTE — Progress Notes (Signed)
Patient is scheduled for 08/28/17 at 2:00 PM with Dr. Irish Lack.

## 2017-08-28 ENCOUNTER — Ambulatory Visit: Payer: Medicare HMO | Admitting: Interventional Cardiology

## 2017-08-30 ENCOUNTER — Encounter: Payer: Self-pay | Admitting: Interventional Cardiology

## 2017-08-30 ENCOUNTER — Ambulatory Visit: Payer: Medicare HMO | Admitting: Interventional Cardiology

## 2017-08-30 VITALS — BP 132/80 | HR 55 | Ht 69.0 in | Wt 208.2 lb

## 2017-08-30 DIAGNOSIS — I252 Old myocardial infarction: Secondary | ICD-10-CM

## 2017-08-30 DIAGNOSIS — I10 Essential (primary) hypertension: Secondary | ICD-10-CM

## 2017-08-30 DIAGNOSIS — E782 Mixed hyperlipidemia: Secondary | ICD-10-CM | POA: Diagnosis not present

## 2017-08-30 DIAGNOSIS — I2510 Atherosclerotic heart disease of native coronary artery without angina pectoris: Secondary | ICD-10-CM | POA: Diagnosis not present

## 2017-08-30 NOTE — Patient Instructions (Signed)
Medication Instructions:  Your physician recommends that you continue on your current medications as directed. Please refer to the Current Medication list given to you today.   Labwork: None ordered  Testing/Procedures: None ordered  Follow-Up: Keep your appointment with Dr. Rockey Situ on 11/01/17 at 2:20 PM   Any Other Special Instructions Will Be Listed Below (If Applicable).     If you need a refill on your cardiac medications before your next appointment, please call your pharmacy.

## 2017-08-30 NOTE — Progress Notes (Signed)
Cardiology Office Note   Date:  08/30/2017   ID:  George Doyle, DOB 27-Apr-1951, George Doyle  PCP:  Juline Patch, MD    No chief complaint on file.  CAD  Wt Readings from Last 3 Encounters:  08/30/17 208 lb 3.2 oz (94.4 kg)  08/01/17 206 lb 8 oz (93.7 kg)  05/10/17 204 lb (92.5 kg)       History of Present Illness: George Doyle is a 66 y.o. male  Who has CAD, and has been referred by Dr. Rockey Situ for interventional cardiology consult given multivessel CAD.  He had non-ST elevation MI in November 2018.  At that time, he had symptoms on and off for a few days while at home.  He did not seek medical attention until after about 5 days of symptoms.  He went to the hospital at [facility] and had a cardiac catheterization which revealed a long RCA occlusion with a focal lesion in the circumflex.  The circumflex was a relatively small vessel.  He had brisk left to right collaterals.  He has been managed medically since that time.  He denies any chest discomfort.  He has some dyspnea on exertion.  He has been quite inactive since his MI.  He was scared to resume activity.  He has not used any sublingual nitroglycerin.  Denies : Chest pain. Dizziness. Leg edema. Nitroglycerin use. Orthopnea. Palpitations. Paroxysmal nocturnal dyspnea. Syncope.   No bleeding problems.   He has not started cardiac rehab at [facility] as of yet.    Past Medical History:  Diagnosis Date  . Allergy   . Arthritis    knees, hands  . Hypertension   . Seizures ([place])    approx 25+ yrs ago.  Testing showed no cause. None since    Past Surgical History:  Procedure Laterality Date  . COLONOSCOPY  2011   cleared for 10 yrs  . COLONOSCOPY WITH PROPOFOL N/A 11/06/2016   Procedure: COLONOSCOPY WITH PROPOFOL;  Surgeon: [name], MD;  Location: [place];  Service: Endoscopy;  Laterality: N/A;  . CORONARY STENT INTERVENTION N/A 07/23/2017   Procedure: CORONARY STENT  INTERVENTION;  Surgeon: Yolonda Kida, MD;  Location: [place] [facility];  Service: Cardiovascular;  Laterality: N/A;  . HERNIA REPAIR    . KNEE SURGERY Bilateral    2 on R) 1 on L)  . LEFT HEART CATH AND CORONARY ANGIOGRAPHY N/A 07/23/2017   Procedure: LEFT HEART CATH AND CORONARY ANGIOGRAPHY;  Surgeon: Minna Merritts, MD;  Location: [place] [facility];  Service: Cardiovascular;  Laterality: N/A;  . POLYPECTOMY  11/06/2016   Procedure: POLYPECTOMY;  Surgeon: [name], MD;  Location: [place];  Service: Endoscopy;;     Current Outpatient Medications  Medication Sig Dispense Refill  . aspirin EC 81 MG EC tablet Take 1 tablet (81 mg total) daily by mouth. 30 tablet 1  . atorvastatin (LIPITOR) 80 MG tablet Take 1 tablet (80 mg total) daily at 6 PM by mouth. 90 tablet 3  . clopidogrel (PLAVIX) 75 MG tablet Take 1 tablet (75 mg total) daily by mouth. 90 tablet 3  . metoprolol tartrate (LOPRESSOR) 25 MG tablet Take 1 tablet (25 mg total) 2 (two) times daily by mouth. 180 tablet 3  . nitroGLYCERIN (NITROSTAT) 0.4 MG SL tablet Place 1 tablet (0.4 mg total) every 5 (five) minutes x 3 doses as needed under the tongue for chest pain. 30 tablet 2  . ramipril (  ALTACE) 10 MG capsule Take 2 capsules (20 mg total) daily by mouth. 180 capsule 3   No current facility-administered medications for this visit.     Allergies:   Patient has no known allergies.    Social History:  The patient  reports that he has quit smoking. he has never used smokeless tobacco. He reports that he drinks alcohol. He reports that he does not use drugs.   Family History:  [name] family history includes CAD in his father.    ROS:  Please see the history of present illness.   Otherwise, review of systems are positive for DOE; firmness in the right groin-improving.   All other systems are reviewed and negative.    PHYSICAL EXAM: VS:  BP 132/80 (BP Location: Left Arm, Patient Position: Sitting,  Cuff Size: Large)   Pulse (!) 55   Ht 5\' 9"  (1.753 m)   Wt 208 lb 3.2 oz (94.4 kg)   SpO2 99%   BMI 30.75 kg/m  , BMI Body mass index is 30.75 kg/m. GEN: Well nourished, well developed, in no acute distress  HEENT: normal  Neck: no JVD, carotid bruits, or masses Cardiac: RRR; no murmurs, rubs, or gallops,no edema  Respiratory:  clear to auscultation bilaterally, normal work of breathing GI: soft, nontender, nondistended, + BS MS: no deformity or atrophy ; small firm area in the right groin Skin: warm and dry, no rash Neuro:  Strength and sensation are intact Psych: euthymic mood, full affect    Recent Labs: 07/23/2017: BUN 19; Creatinine, Ser 1.07; Potassium 3.7; Sodium 137 07/24/2017: Hemoglobin 12.8; Platelets 197   Lipid Panel    Component Value Date/Time   CHOL 202 (H) 05/10/2017 1011   TRIG 118 05/10/2017 1011   HDL 55 05/10/2017 1011   CHOLHDL 3.7 05/10/2017 1011   LDLCALC 123 (H) 05/10/2017 1011     Other studies Reviewed: Additional studies/ records that were reviewed today with results demonstrating: I personally reviewed his cath films from 11/18.   ASSESSMENT AND PLAN:  1. CAD/Old MI: Large, dominant right coronary artery with chronic total occlusion.  Focal disease in the circumflex.  He is not having angina on current medical therapy.  I would not pursue CTO PCI at this time given his lack of symptoms and given the multiple features of the occlusion that are unfavorable for success.  It is a long occlusion that ends just before the bifurcation of the PDA and posterolateral artery.  The lesion is heavily calcified.  There is also a heavily calcified lesion in the posterior lateral artery.  He has room to manage medications for symptoms at this time.  If he did develop anginal symptoms refractory to medical therapy, would also have to consider bypass surgery given his multivessel disease and the lower likelihood of success of CTO PCI of the RCA.  The likelihood of  maintaining flow to both large distal branches is low.  I explained the risk benefit ratio to the patient and wife and they are in agreement of pursuing medical therapy at this time. 2. Hyperlipidemia: LDL above target given CAD and MI.  Statin will be helpful to get LDL under target. 3. Hypertension: Blood pressure controlled.   Current medicines are reviewed at length with the patient today.  The patient concerns regarding his medicines were addressed.  The following changes have been made:  No change  Labs/ tests ordered today include:  No orders of the defined types were placed in this encounter.  Recommend 150 minutes/week of aerobic exercise Low fat, low carb, high fiber diet recommended  Disposition:   FU with Dr. Rockey Situ.   Signed, Larae Grooms, MD  08/30/2017 12:59 PM    [address], [place], [place]  [address] Phone: [phone]; Fax: [phone]

## 2017-09-03 ENCOUNTER — Ambulatory Visit: Payer: Medicare HMO | Admitting: Interventional Cardiology

## 2017-09-24 ENCOUNTER — Encounter: Payer: Self-pay | Admitting: *Deleted

## 2017-09-24 ENCOUNTER — Encounter: Payer: Medicare HMO | Attending: Cardiovascular Disease | Admitting: *Deleted

## 2017-09-24 VITALS — Ht 70.5 in | Wt 208.4 lb

## 2017-09-24 DIAGNOSIS — I214 Non-ST elevation (NSTEMI) myocardial infarction: Secondary | ICD-10-CM | POA: Diagnosis not present

## 2017-09-24 NOTE — Patient Instructions (Signed)
Patient Instructions  Patient Details  Name: George Doyle MRN: [id number] Date of Birth: Jun 05, 1951 Referring Provider:  Minna Merritts, MD  Below are the personal goals you chose as well as exercise and nutrition goals. Our goal is to help you keep on track towards obtaining and maintaining your goals. We will be discussing your progress on these goals with you throughout the program.  Initial Exercise Prescription: Initial Exercise Prescription - 09/24/17 1400      Date of Initial Exercise RX and Referring Provider   Date  09/24/17    Referring Provider  Ida Rogue MD      Treadmill   MPH  2.5    Grade  1    Minutes  15    METs  3.26      NuStep   Level  3    SPM  80    Minutes  15    METs  3      Arm Ergometer   Level  3    RPM  30    Minutes  15    METs  2      Prescription Details   Frequency (times per week)  3    Duration  Progress to 45 minutes of aerobic exercise without signs/symptoms of physical distress      Intensity   THRR 40-80% of Max Heartrate  97-134    Ratings of Perceived Exertion  11-13    Perceived Dyspnea  0-4      Progression   Progression  Continue to progress workloads to maintain intensity without signs/symptoms of physical distress.      Resistance Training   Training Prescription  Yes    Weight  3 lbs    Reps  10-15       Exercise Goals: Frequency: Be able to perform aerobic exercise three times per week working toward 3-5 days per week.  Intensity: Work with a perceived exertion of 11 (fairly light) - 15 (hard) as tolerated. Follow your new exercise prescription and watch for changes in prescription as you progress with the program. Changes will be reviewed with you when they are made.  Duration: You should be able to do 30 minutes of continuous aerobic exercise in addition to a 5 minute warm-up and a 5 minute cool-down routine.  Nutrition Goals: Your personal nutrition goals will be established when you do  your nutrition analysis with the dietician.  The following are nutrition guidelines to follow: Cholesterol < 200mg /day Sodium < 1500mg /day Fiber: Men over 50 yrs - 30 grams per day  Personal Goals: Personal Goals and Risk Factors at Admission - 09/24/17 1303      Core Components/Risk Factors/Patient Goals on Admission    Weight Management  Yes;Obesity;Weight Loss    Intervention  Weight Management: Develop a combined nutrition and exercise program designed to reach desired caloric intake, while maintaining appropriate intake of nutrient and fiber, sodium and fats, and appropriate energy expenditure required for the weight goal.;Weight Management: Provide education and appropriate resources to help participant work on and attain dietary goals.;Weight Management/Obesity: Establish reasonable short term and long term weight goals.;Obesity: Provide education and appropriate resources to help participant work on and attain dietary goals.    Admit Weight  208 lb 6.4 oz (94.5 kg)    Goal Weight: Short Term  205 lb (93 kg)    Goal Weight: Long Term  190 lb (86.2 kg)    Expected Outcomes  Short Term: Continue to assess  and modify interventions until short term weight is achieved;Long Term: Adherence to nutrition and physical activity/exercise program aimed toward attainment of established weight goal;Weight Loss: Understanding of general recommendations for a balanced deficit meal plan, which promotes 1-2 lb weight loss per week and includes a negative energy balance of [phone] kcal/d;Understanding recommendations for meals to include 15-35% energy as protein, 25-35% energy from fat, 35-60% energy from carbohydrates, less than 200mg  of dietary cholesterol, 20-35 gm of total fiber daily;Understanding of distribution of calorie intake throughout the day with the consumption of 4-5 meals/snacks    Improve shortness of breath with ADL's  Yes states he gets SOB a lot with minimal exertion. Is worried b/c SOB is  what he had when he was having his heart attack in the middle of the night.     Intervention  Provide education, individualized exercise plan and daily activity instruction to help decrease symptoms of SOB with activities of daily living.    Expected Outcomes  Short Term: Achieves a reduction of symptoms when performing activities of daily living.    Hypertension  Yes    Intervention  Provide education on lifestyle modifcations including regular physical activity/exercise, weight management, moderate sodium restriction and increased consumption of fresh fruit, vegetables, and low fat dairy, alcohol moderation, and smoking cessation.;Monitor prescription use compliance.    Expected Outcomes  Short Term: Continued assessment and intervention until BP is < 140/57mm HG in hypertensive participants. < 130/58mm HG in hypertensive participants with diabetes, heart failure or chronic kidney disease.;Long Term: Maintenance of blood pressure at goal levels.    Lipids  Yes    Intervention  Provide education and support for participant on nutrition & aerobic/resistive exercise along with prescribed medications to achieve LDL 70mg , HDL >40mg .    Expected Outcomes  Short Term: Participant states understanding of desired cholesterol values and is compliant with medications prescribed. Participant is following exercise prescription and nutrition guidelines.;Long Term: Cholesterol controlled with medications as prescribed, with individualized exercise RX and with personalized nutrition plan. Value goals: LDL < 70mg , HDL > 40 mg.    Stress  Yes concerns over his health and what he is and is not allowed to do    Intervention  Offer individual and/or small group education and counseling on adjustment to heart disease, stress management and health-related lifestyle change. Teach and support self-help strategies.;Refer participants experiencing significant psychosocial distress to appropriate mental health specialists for  further evaluation and treatment. When possible, include family members and significant others in education/counseling sessions.    Expected Outcomes  Short Term: Participant demonstrates changes in health-related behavior, relaxation and other stress management skills, ability to obtain effective social support, and compliance with psychotropic medications if prescribed.;Long Term: Emotional wellbeing is indicated by absence of clinically significant psychosocial distress or social isolation.       Tobacco Use Initial Evaluation: Social History   Tobacco Use  Smoking Status Former Smoker  Smokeless Tobacco Never Used  Tobacco Comment   quit over 20 yrs ago    Exercise Goals and Review: Exercise Goals    Row Name 09/24/17 1410             Exercise Goals   Increase Physical Activity  Yes       Intervention  Provide advice, education, support and counseling about physical activity/exercise needs.;Develop an individualized exercise prescription for aerobic and resistive training based on initial evaluation findings, risk stratification, comorbidities and participant's personal goals.       Expected Outcomes  Achievement of increased cardiorespiratory fitness and enhanced flexibility, muscular endurance and strength shown through measurements of functional capacity and personal statement of participant.       Increase Strength and Stamina  Yes       Intervention  Provide advice, education, support and counseling about physical activity/exercise needs.;Develop an individualized exercise prescription for aerobic and resistive training based on initial evaluation findings, risk stratification, comorbidities and participant's personal goals.       Expected Outcomes  Achievement of increased cardiorespiratory fitness and enhanced flexibility, muscular endurance and strength shown through measurements of functional capacity and personal statement of participant.       Able to understand and use  rate of perceived exertion (RPE) scale  Yes       Intervention  Provide education and explanation on how to use RPE scale       Expected Outcomes  Short Term: Able to use RPE daily in rehab to express subjective intensity level;Long Term:  Able to use RPE to guide intensity level when exercising independently       Knowledge and understanding of Target Heart Rate Range (THRR)  Yes       Intervention  Provide education and explanation of THRR including how the numbers were predicted and where they are located for reference       Expected Outcomes  Short Term: Able to state/look up THRR;Long Term: Able to use THRR to govern intensity when exercising independently;Short Term: Able to use daily as guideline for intensity in rehab       Able to check pulse independently  Yes       Intervention  Provide education and demonstration on how to check pulse in carotid and radial arteries.;Review the importance of being able to check your own pulse for safety during independent exercise       Expected Outcomes  Short Term: Able to explain why pulse checking is important during independent exercise;Long Term: Able to check pulse independently and accurately       Understanding of Exercise Prescription  Yes       Intervention  Provide education, explanation, and written materials on patient's individual exercise prescription       Expected Outcomes  Short Term: Able to explain program exercise prescription;Long Term: Able to explain home exercise prescription to exercise independently          Copy of goals given to participant.

## 2017-09-24 NOTE — Progress Notes (Signed)
Cardiac Individual Treatment Plan  Patient Details  Name: George Doyle MRN: [id number] Date of Birth: [birth year] Referring Provider:     Cardiac Rehab from 09/24/2017 in [facility] Cardiac and Pulmonary Rehab  Referring Provider  Ida Rogue MD      Initial Encounter Date:    Cardiac Rehab from 09/24/2017 in [facility] Cardiac and Pulmonary Rehab  Date  09/24/17  Referring Provider  Ida Rogue MD      Visit Diagnosis: NSTEMI (non-ST elevated myocardial infarction) [facility])  Patient's Home Medications on Admission:  Current Outpatient Medications:  .  aspirin EC 81 MG EC tablet, Take 1 tablet (81 mg total) daily by mouth., Disp: 30 tablet, Rfl: 1 .  atorvastatin (LIPITOR) 80 MG tablet, Take 1 tablet (80 mg total) daily at 6 PM by mouth., Disp: 90 tablet, Rfl: 3 .  clopidogrel (PLAVIX) 75 MG tablet, Take 1 tablet (75 mg total) daily by mouth., Disp: 90 tablet, Rfl: 3 .  metoprolol tartrate (LOPRESSOR) 25 MG tablet, Take 1 tablet (25 mg total) 2 (two) times daily by mouth., Disp: 180 tablet, Rfl: 3 .  nitroGLYCERIN (NITROSTAT) 0.4 MG SL tablet, Place 1 tablet (0.4 mg total) every 5 (five) minutes x 3 doses as needed under the tongue for chest pain., Disp: 30 tablet, Rfl: 2 .  ramipril (ALTACE) 10 MG capsule, Take 2 capsules (20 mg total) daily by mouth., Disp: 180 capsule, Rfl: 3  Past Medical History: Past Medical History:  Diagnosis Date  . Allergy   . Arthritis    knees, hands  . Hypertension   . Seizures ([place])    approx 25+ yrs ago.  Testing showed no cause. None since    Tobacco Use: Social History   Tobacco Use  Smoking Status Former Smoker  Smokeless Tobacco Never Used  Tobacco Comment   quit over 20 yrs ago    Labs: Recent Chemical engineer    Labs for ITP Cardiac and Pulmonary Rehab Latest Ref Rng & Units 07/12/2015 03/03/2016 10/12/2016 05/10/2017   Cholestrol 100 - 199 mg/dL 229(H) 201(H) 223(H) 202(H)   LDLCALC 0 - 99 mg/dL 140(H) 120(H) 136(H)  123(H)   HDL >39 mg/dL 56 63 64 55   Trlycerides 0 - 149 mg/dL 164(H) 90 113 118       Exercise Target Goals: Date: 09/24/17  Exercise Program Goal: Individual exercise prescription set with THRR, safety & activity barriers. Participant demonstrates ability to understand and report RPE using BORG scale, to self-measure pulse accurately, and to acknowledge the importance of the exercise prescription.  Exercise Prescription Goal: Starting with aerobic activity 30 plus minutes a day, 3 days per week for initial exercise prescription. Provide home exercise prescription and guidelines that participant acknowledges understanding prior to discharge.  Activity Barriers & Risk Stratification: Activity Barriers & Cardiac Risk Stratification - 09/24/17 1309      Activity Barriers & Cardiac Risk Stratification   Activity Barriers  Arthritis;Back Problems;Shortness of Breath;Other (comment);Deconditioning;Joint Problems    Comments  Arthritis in both hands, he has L2/L3 ongoing issues in his back. States he has had scope surgery on bilateral knees. They sometimes give out on him for no reason and have caused him to fall in the past.     Cardiac Risk Stratification  High       6 Minute Walk: 6 Minute Walk    Row Name 09/24/17 1354         6 Minute Walk   Phase  Initial  Distance  1365 feet     Walk Time  6 minutes     # of Rest Breaks  0     MPH  2.56     METS  3.37     RPE  11     Perceived Dyspnea   3     VO2 Peak  11.79     Symptoms  Yes (comment)     Comments  calves (7/10), SOB, knee pain (1/10)     Resting HR  61 bpm     Resting BP  126/56     Resting Oxygen Saturation   100 %     Exercise Oxygen Saturation  during 6 min walk  100 %     Max Ex. HR  61 bpm     Max Ex. BP  126/56     2 Minute Post BP  146/72        Oxygen Initial Assessment:   Oxygen Re-Evaluation:   Oxygen Discharge (Final Oxygen Re-Evaluation):   Initial Exercise Prescription: Initial  Exercise Prescription - 09/24/17 1400      Date of Initial Exercise RX and Referring Provider   Date  09/24/17    Referring Provider  Ida Rogue MD      Treadmill   MPH  2.5    Grade  1    Minutes  15    METs  3.26      NuStep   Level  3    SPM  80    Minutes  15    METs  3      Arm Ergometer   Level  3    RPM  30    Minutes  15    METs  2      Prescription Details   Frequency (times per week)  3    Duration  Progress to 45 minutes of aerobic exercise without signs/symptoms of physical distress      Intensity   THRR 40-80% of Max Heartrate  97-134    Ratings of Perceived Exertion  11-13    Perceived Dyspnea  0-4      Progression   Progression  Continue to progress workloads to maintain intensity without signs/symptoms of physical distress.      Resistance Training   Training Prescription  Yes    Weight  3 lbs    Reps  10-15       Perform Capillary Blood Glucose checks as needed.  Exercise Prescription Changes: Exercise Prescription Changes    Row Name 09/24/17 1300             Response to Exercise   Blood Pressure (Admit)  126/56       Blood Pressure (Exercise)  164/92       Blood Pressure (Exit)  146/72       Heart Rate (Admit)  61 bpm       Heart Rate (Exercise)  106 bpm       Heart Rate (Exit)  69 bpm       Oxygen Saturation (Admit)  100 %       Oxygen Saturation (Exercise)  100 %       Rating of Perceived Exertion (Exercise)  11       Perceived Dyspnea (Exercise)  3       Symptoms  calves burning (7/10), SOB, knee pain (1/10)       Comments  walk test results  Exercise Comments:   Exercise Goals and Review: Exercise Goals    Row Name 09/24/17 1410             Exercise Goals   Increase Physical Activity  Yes       Intervention  Provide advice, education, support and counseling about physical activity/exercise needs.;Develop an individualized exercise prescription for aerobic and resistive training based on initial  evaluation findings, risk stratification, comorbidities and participant's personal goals.       Expected Outcomes  Achievement of increased cardiorespiratory fitness and enhanced flexibility, muscular endurance and strength shown through measurements of functional capacity and personal statement of participant.       Increase Strength and Stamina  Yes       Intervention  Provide advice, education, support and counseling about physical activity/exercise needs.;Develop an individualized exercise prescription for aerobic and resistive training based on initial evaluation findings, risk stratification, comorbidities and participant's personal goals.       Expected Outcomes  Achievement of increased cardiorespiratory fitness and enhanced flexibility, muscular endurance and strength shown through measurements of functional capacity and personal statement of participant.       Able to understand and use rate of perceived exertion (RPE) scale  Yes       Intervention  Provide education and explanation on how to use RPE scale       Expected Outcomes  Short Term: Able to use RPE daily in rehab to express subjective intensity level;Long Term:  Able to use RPE to guide intensity level when exercising independently       Knowledge and understanding of Target Heart Rate Range (THRR)  Yes       Intervention  Provide education and explanation of THRR including how the numbers were predicted and where they are located for reference       Expected Outcomes  Short Term: Able to state/look up THRR;Long Term: Able to use THRR to govern intensity when exercising independently;Short Term: Able to use daily as guideline for intensity in rehab       Able to check pulse independently  Yes       Intervention  Provide education and demonstration on how to check pulse in carotid and radial arteries.;Review the importance of being able to check your own pulse for safety during independent exercise       Expected Outcomes  Short Term:  Able to explain why pulse checking is important during independent exercise;Long Term: Able to check pulse independently and accurately       Understanding of Exercise Prescription  Yes       Intervention  Provide education, explanation, and written materials on patient's individual exercise prescription       Expected Outcomes  Short Term: Able to explain program exercise prescription;Long Term: Able to explain home exercise prescription to exercise independently          Exercise Goals Re-Evaluation :   Discharge Exercise Prescription (Final Exercise Prescription Changes): Exercise Prescription Changes - 09/24/17 1300      Response to Exercise   Blood Pressure (Admit)  126/56    Blood Pressure (Exercise)  164/92    Blood Pressure (Exit)  146/72    Heart Rate (Admit)  61 bpm    Heart Rate (Exercise)  106 bpm    Heart Rate (Exit)  69 bpm    Oxygen Saturation (Admit)  100 %    Oxygen Saturation (Exercise)  100 %    Rating of Perceived Exertion (Exercise)  11    Perceived Dyspnea (Exercise)  3    Symptoms  calves burning (7/10), SOB, knee pain (1/10)    Comments  walk test results       Nutrition:  Target Goals: Understanding of nutrition guidelines, daily intake of sodium 1500mg , cholesterol 200mg , calories 30% from fat and 7% or less from saturated fats, daily to have 5 or more servings of fruits and vegetables.  Biometrics: Pre Biometrics - 09/24/17 1414      Pre Biometrics   Height  5' 10.5" (1.791 m)    Weight  208 lb 6.4 oz (94.5 kg)    Waist Circumference  42.5 inches    Hip Circumference  39.75 inches    Waist to Hip Ratio  1.07 %    BMI (Calculated)  29.47    Single Leg Stand  2.27 seconds        Nutrition Therapy Plan and Nutrition Goals: Nutrition Therapy & Goals - 09/24/17 1305      Intervention Plan   Expected Outcomes  Short Term Goal: Understand basic principles of dietary content, such as calories, fat, sodium, cholesterol and nutrients.;Short Term  Goal: A plan has been developed with personal nutrition goals set during dietitian appointment.;Long Term Goal: Adherence to prescribed nutrition plan.       Nutrition Discharge: Rate Your Plate Scores: Nutrition Assessments - 09/24/17 1306      MEDFICTS Scores   Pre Score  69       Nutrition Goals Re-Evaluation:   Nutrition Goals Discharge (Final Nutrition Goals Re-Evaluation):   Psychosocial: Target Goals: Acknowledge presence or absence of significant depression and/or stress, maximize coping skills, provide positive support system. Participant is able to verbalize types and ability to use techniques and skills needed for reducing stress and depression.   Initial Review & Psychosocial Screening: Initial Psych Review & Screening - 09/24/17 1306      Initial Review   Current issues with  Current Stress Concerns    Source of Stress Concerns  Unable to participate in former interests or hobbies;Unable to perform yard/household activities    Comments  He would like to be able to do house and yard work without getting SOB. He also would like to feel less fear when doing things that exert more energy.       Family Dynamics   Good Support System?  Yes    Comments  wife/family      Barriers   Psychosocial barriers to participate in program  The patient should benefit from training in stress management and relaxation.      Screening Interventions   Interventions  Yes;Encouraged to exercise;Program counselor consult;Provide feedback about the scores to participant;To provide support and resources with identified psychosocial needs    Expected Outcomes  Short Term goal: Utilizing psychosocial counselor, staff and physician to assist with identification of specific Stressors or current issues interfering with healing process. Setting desired goal for each stressor or current issue identified.;Long Term Goal: Stressors or current issues are controlled or eliminated.;Short Term goal:  Identification and review with participant of any Quality of Life or Depression concerns found by scoring the questionnaire.;Long Term goal: The participant improves quality of Life and PHQ9 Scores as seen by post scores and/or verbalization of changes       Quality of Life Scores:  Quality of Life - 09/24/17 1046      Quality of Life Scores   Health/Function Pre  20.73 %    Socioeconomic Pre  19.86 %  Psych/Spiritual Pre  20.57 %    Family Pre  21 %    GLOBAL Pre  20.56 %       PHQ-9: Recent Review Flowsheet Data    Depression screen [facility] 2/9 09/24/2017 05/10/2017 03/03/2016   Decreased Interest 1 0 0   Down, Depressed, Hopeless 0 0 0   PHQ - 2 Score 1 0 0   Altered sleeping 0 0 -   Tired, decreased energy 1 0 -   Change in appetite 0 0 -   Feeling bad or failure about yourself  0 0 -   Trouble concentrating 0 0 -   Moving slowly or fidgety/restless 1 0 -   Suicidal thoughts 0 0 -   PHQ-9 Score 3 0 -   Difficult doing work/chores Somewhat difficult - -     Interpretation of Total Score  Total Score Depression Severity:  1-4 = Minimal depression, 5-9 = Mild depression, 10-14 = Moderate depression, 15-19 = Moderately severe depression, 20-27 = Severe depression   Psychosocial Evaluation and Intervention:   Psychosocial Re-Evaluation:   Psychosocial Discharge (Final Psychosocial Re-Evaluation):   Vocational Rehabilitation: Provide vocational rehab assistance to qualifying candidates.   Vocational Rehab Evaluation & Intervention: Vocational Rehab - 09/24/17 1311      Initial Vocational Rehab Evaluation & Intervention   Assessment shows need for Vocational Rehabilitation  No       Education: Education Goals: Education classes will be provided on a variety of topics geared toward better understanding of heart health and risk factor modification. Participant will state understanding/return demonstration of topics presented as noted by education test  scores.  Learning Barriers/Preferences: Learning Barriers/Preferences - 09/24/17 1311      Learning Barriers/Preferences   Learning Barriers  None    Learning Preferences  None       Education Topics: General Nutrition Guidelines/Fats and Fiber: -Group instruction provided by verbal, written material, models and posters to present the general guidelines for heart healthy nutrition. Gives an explanation and review of dietary fats and fiber.   Controlling Sodium/Reading Food Labels: -Group verbal and written material supporting the discussion of sodium use in heart healthy nutrition. Review and explanation with models, verbal and written materials for utilization of the food label.   Exercise Physiology & Risk Factors: - Group verbal and written instruction with models to review the exercise physiology of the cardiovascular system and associated critical values. Details cardiovascular disease risk factors and the goals associated with each risk factor.   Aerobic Exercise & Resistance Training: - Gives group verbal and written discussion on the health impact of inactivity. On the components of aerobic and resistive training programs and the benefits of this training and how to safely progress through these programs.   Flexibility, Balance, General Exercise Guidelines: - Provides group verbal and written instruction on the benefits of flexibility and balance training programs. Provides general exercise guidelines with specific guidelines to those with heart or lung disease. Demonstration and skill practice provided.   Stress Management: - Provides group verbal and written instruction about the health risks of elevated stress, cause of high stress, and healthy ways to reduce stress.   Depression: - Provides group verbal and written instruction on the correlation between heart/lung disease and depressed mood, treatment options, and the stigmas associated with seeking  treatment.   Anatomy & Physiology of the Heart: - Group verbal and written instruction and models provide basic cardiac anatomy and physiology, with the coronary electrical and arterial systems. Review  of: AMI, Angina, Valve disease, Heart Failure, Cardiac Arrhythmia, Pacemakers, and the ICD.   Cardiac Procedures: - Group verbal and written instruction to review commonly prescribed medications for heart disease. Reviews the medication, class of the drug, and side effects. Includes the steps to properly store meds and maintain the prescription regimen. (beta blockers and nitrates)   Cardiac Medications I: - Group verbal and written instruction to review commonly prescribed medications for heart disease. Reviews the medication, class of the drug, and side effects. Includes the steps to properly store meds and maintain the prescription regimen.   Cardiac Medications II: -Group verbal and written instruction to review commonly prescribed medications for heart disease. Reviews the medication, class of the drug, and side effects. (all other drug classes)    Go Sex-Intimacy & Heart Disease, Get SMART - Goal Setting: - Group verbal and written instruction through game format to discuss heart disease and the return to sexual intimacy. Provides group verbal and written material to discuss and apply goal setting through the application of the S.M.A.R.T. Method.   Other Matters of the Heart: - Provides group verbal, written materials and models to describe Heart Failure, Angina, Valve Disease, Peripheral Artery Disease, and Diabetes in the realm of heart disease. Includes description of the disease process and treatment options available to the cardiac patient.   Exercise & Equipment Safety: - Individual verbal instruction and demonstration of equipment use and safety with use of the equipment.   Cardiac Rehab from 09/24/2017 in [facility] Cardiac and Pulmonary Rehab  Date  09/24/17  Educator  KS   Instruction Review Code  1- Verbalizes Understanding      Infection Prevention: - Provides verbal and written material to individual with discussion of infection control including proper hand washing and proper equipment cleaning during exercise session.   Cardiac Rehab from 09/24/2017 in [facility] Cardiac and Pulmonary Rehab  Date  09/24/17  Educator  KS  Instruction Review Code  1- Verbalizes Understanding      Falls Prevention: - Provides verbal and written material to individual with discussion of falls prevention and safety.   Cardiac Rehab from 09/24/2017 in [facility] Cardiac and Pulmonary Rehab  Date  09/24/17  Educator  KS  Instruction Review Code  1- Verbalizes Understanding      Diabetes: - Individual verbal and written instruction to review signs/symptoms of diabetes, desired ranges of glucose level fasting, after meals and with exercise. Acknowledge that pre and post exercise glucose checks will be done for 3 sessions at entry of program.   Other: -Provides group and verbal instruction on various topics (see comments)    Knowledge Questionnaire Score: Knowledge Questionnaire Score - 09/24/17 1047      Knowledge Questionnaire Score   Pre Score  24/28 reviewed answers with patient, who verbalized understanding.        Core Components/Risk Factors/Patient Goals at Admission: Personal Goals and Risk Factors at Admission - 09/24/17 1303      Core Components/Risk Factors/Patient Goals on Admission    Weight Management  Yes;Obesity;Weight Loss    Intervention  Weight Management: Develop a combined nutrition and exercise program designed to reach desired caloric intake, while maintaining appropriate intake of nutrient and fiber, sodium and fats, and appropriate energy expenditure required for the weight goal.;Weight Management: Provide education and appropriate resources to help participant work on and attain dietary goals.;Weight Management/Obesity: Establish reasonable short  term and long term weight goals.;Obesity: Provide education and appropriate resources to help participant work on and attain  dietary goals.    Admit Weight  208 lb 6.4 oz (94.5 kg)    Goal Weight: Short Term  205 lb (93 kg)    Goal Weight: Long Term  190 lb (86.2 kg)    Expected Outcomes  Short Term: Continue to assess and modify interventions until short term weight is achieved;Long Term: Adherence to nutrition and physical activity/exercise program aimed toward attainment of established weight goal;Weight Loss: Understanding of general recommendations for a balanced deficit meal plan, which promotes 1-2 lb weight loss per week and includes a negative energy balance of [phone] kcal/d;Understanding recommendations for meals to include 15-35% energy as protein, 25-35% energy from fat, 35-60% energy from carbohydrates, less than 200mg  of dietary cholesterol, 20-35 gm of total fiber daily;Understanding of distribution of calorie intake throughout the day with the consumption of 4-5 meals/snacks    Improve shortness of breath with ADL's  Yes states he gets SOB a lot with minimal exertion. Is worried b/c SOB is what he had when he was having his heart attack in the middle of the night.     Intervention  Provide education, individualized exercise plan and daily activity instruction to help decrease symptoms of SOB with activities of daily living.    Expected Outcomes  Short Term: Achieves a reduction of symptoms when performing activities of daily living.    Hypertension  Yes    Intervention  Provide education on lifestyle modifcations including regular physical activity/exercise, weight management, moderate sodium restriction and increased consumption of fresh fruit, vegetables, and low fat dairy, alcohol moderation, and smoking cessation.;Monitor prescription use compliance.    Expected Outcomes  Short Term: Continued assessment and intervention until BP is < 140/39mm HG in hypertensive participants. <  130/48mm HG in hypertensive participants with diabetes, heart failure or chronic kidney disease.;Long Term: Maintenance of blood pressure at goal levels.    Lipids  Yes    Intervention  Provide education and support for participant on nutrition & aerobic/resistive exercise along with prescribed medications to achieve LDL 70mg , HDL >40mg .    Expected Outcomes  Short Term: Participant states understanding of desired cholesterol values and is compliant with medications prescribed. Participant is following exercise prescription and nutrition guidelines.;Long Term: Cholesterol controlled with medications as prescribed, with individualized exercise RX and with personalized nutrition plan. Value goals: LDL < 70mg , HDL > 40 mg.    Stress  Yes concerns over his health and what he is and is not allowed to do    Intervention  Offer individual and/or small group education and counseling on adjustment to heart disease, stress management and health-related lifestyle change. Teach and support self-help strategies.;Refer participants experiencing significant psychosocial distress to appropriate mental health specialists for further evaluation and treatment. When possible, include family members and significant others in education/counseling sessions.    Expected Outcomes  Short Term: Participant demonstrates changes in health-related behavior, relaxation and other stress management skills, ability to obtain effective social support, and compliance with psychotropic medications if prescribed.;Long Term: Emotional wellbeing is indicated by absence of clinically significant psychosocial distress or social isolation.       Core Components/Risk Factors/Patient Goals Review:    Core Components/Risk Factors/Patient Goals at Discharge (Final Review):    ITP Comments: ITP Comments    Row Name 09/24/17 1045           ITP Comments  Med Review completed. Initial ITP created. Diagnosis can be found in [facility] encounter  07/20/17  Comments: Initial ITP

## 2017-09-26 DIAGNOSIS — I214 Non-ST elevation (NSTEMI) myocardial infarction: Secondary | ICD-10-CM

## 2017-09-26 NOTE — Progress Notes (Signed)
Daily Session Note  Patient Details  Name: George Doyle MRN: [id number] Date of Birth: [birth year] Referring Provider:     Cardiac Rehab from 09/24/2017 in [facility] Cardiac and Pulmonary Rehab  Referring Provider  Ida Rogue MD      Encounter Date: 09/26/2017  Check In: Session Check In - 09/26/17 0736      Check-In   Location  [facility]-Cardiac & Pulmonary Rehab    Staff Present  Justin Mend Lorre Nick, MA, ACSM RCEP, Exercise Physiologist;Susanne Bice, RN, BSN, CCRP    Supervising physician immediately available to respond to emergencies  See telemetry face sheet for immediately available ER MD    Medication changes reported      No    Fall or balance concerns reported     No    Warm-up and Cool-down  Performed on first and last piece of equipment    Resistance Training Performed  Yes    VAD Patient?  No      Pain Assessment   Currently in Pain?  No/denies          Social History   Tobacco Use  Smoking Status Former Smoker  Smokeless Tobacco Never Used  Tobacco Comment   quit over 20 yrs ago    Goals Met:  Independence with exercise equipment Exercise tolerated well No report of cardiac concerns or symptoms Strength training completed today  Goals Unmet:  Not Applicable  Comments: First full day of exercise!  Patient was oriented to gym and equipment including functions, settings, policies, and procedures.  Patient's individual exercise prescription and treatment plan were reviewed.  All starting workloads were established based on the results of the 6 minute walk test done at initial orientation visit.  The plan for exercise progression was also introduced and progression will be customized based on patient's performance and goals.Pt able to follow exercise prescription today without complaint.  Will continue to monitor for progression.   Dr. Emily Filbert is Medical Director for [place] and [facility].

## 2017-09-28 DIAGNOSIS — I214 Non-ST elevation (NSTEMI) myocardial infarction: Secondary | ICD-10-CM

## 2017-09-28 NOTE — Progress Notes (Signed)
Daily Session Note  Patient Details  Name: George Doyle MRN: [id number] Date of Birth: 01-Feb-1951 Referring Provider:     Cardiac Rehab from 09/24/2017 in [facility] Cardiac and Pulmonary Rehab  Referring Provider  Ida Rogue MD      Encounter Date: 09/28/2017  Check In: Session Check In - 09/28/17 0904      Check-In   Location  [facility] Re[facility]    Staff Present  Renita Papa, RN BSN;Jessica Luan Pulling, MA, ACSM RCEP, Exercise Physiologist;Amanda Oletta Darter, BA, ACSM CEP, Exercise Physiologist    Supervising physician immediately available to respond to emergencies  See telemetry face sheet for immediately available ER MD    Medication changes reported      No    Fall or balance concerns reported     No    Warm-up and Cool-down  Performed on first and last piece of equipment    Resistance Training Performed  Yes    VAD Patient?  No      Pain Assessment   Currently in Pain?  No/denies    Multiple Pain Sites  No          Social History   Tobacco Use  Smoking Status Former Smoker  Smokeless Tobacco Never Used  Tobacco Comment   quit over 20 yrs ago    Goals Met:  Independence with exercise equipment Exercise tolerated well No report of cardiac concerns or symptoms Strength training completed today  Goals Unmet:  Not Applicable  Comments: Pt able to follow exercise prescription today without complaint.  Will continue to monitor for progression.    Dr. Emily Filbert is Medical Director for [place] and [facility].

## 2017-10-01 ENCOUNTER — Encounter: Payer: Medicare HMO | Admitting: *Deleted

## 2017-10-01 DIAGNOSIS — I214 Non-ST elevation (NSTEMI) myocardial infarction: Secondary | ICD-10-CM | POA: Diagnosis not present

## 2017-10-01 NOTE — Progress Notes (Signed)
Daily Session Note  Patient Details  Name: George Doyle MRN: [id number] Date of Birth: [birth year] Referring Provider:     Cardiac Rehab from 09/24/2017 in [facility] Cardiac and Pulmonary Rehab  Referring Provider  Ida Rogue MD      Encounter Date: 10/01/2017  Check In: Session Check In - 10/01/17 0756      Check-In   Location  [facility]monary [facility]    Staff Present  Joellyn Rued, BS, [place], [place], ACSM CEP, Exercise Physiologist;Krista Frederico Hamman, RN BSN    Supervising physician immediately available to respond to emergencies  See telemetry face sheet for immediately available ER MD    Medication changes reported      No    Fall or balance concerns reported     No    Tobacco Cessation  No Change    Warm-up and Cool-down  Performed on first and last piece of equipment    Resistance Training Performed  Yes    VAD Patient?  No      Pain Assessment   Currently in Pain?  No/denies    Multiple Pain Sites  No          Social History   Tobacco Use  Smoking Status Former Smoker  Smokeless Tobacco Never Used  Tobacco Comment   quit over 20 yrs ago    Goals Met:  Independence with exercise equipment Exercise tolerated well No report of cardiac concerns or symptoms Strength training completed today  Goals Unmet:  Not Applicable  Comments: Pt able to follow exercise prescription today without complaint.  Will continue to monitor for progression.    Dr. Emily Filbert is Medical Director for [place] and [facility].

## 2017-10-03 DIAGNOSIS — I214 Non-ST elevation (NSTEMI) myocardial infarction: Secondary | ICD-10-CM

## 2017-10-03 NOTE — Progress Notes (Signed)
Daily Session Note  Patient Details  Name: George Doyle MRN: [id number] Date of Birth: [birth year] Referring Provider:     Cardiac Rehab from 09/24/2017 in [facility] Cardiac and Pulmonary Rehab  Referring Provider  Ida Rogue MD      Encounter Date: 10/03/2017  Check In: Session Check In - 10/03/17 0724      Check-In   Location  [facility] Re[facility]    Staff Present  Justin Mend RCP,RRT,BSRT;Krista Frederico Hamman, RN BSN;Jessica Luan Pulling, MA, [place], CCRP, Exercise Physiologist    Supervising physician immediately available to respond to emergencies  See telemetry face sheet for immediately available ER MD    Medication changes reported      No    Fall or balance concerns reported     No    Warm-up and Cool-down  Performed on first and last piece of equipment    Resistance Training Performed  Yes    VAD Patient?  No      Pain Assessment   Currently in Pain?  No/denies          Social History   Tobacco Use  Smoking Status Former Smoker  Smokeless Tobacco Never Used  Tobacco Comment   quit over 20 yrs ago    Goals Met:  Independence with exercise equipment Exercise tolerated well No report of cardiac concerns or symptoms Strength training completed today  Goals Unmet:  Not Applicable  Comments: Pt able to follow exercise prescription today without complaint.  Will continue to monitor for progression.   Dr. Emily Filbert is Medical Director for [place] and [facility].

## 2017-10-05 DIAGNOSIS — I214 Non-ST elevation (NSTEMI) myocardial infarction: Secondary | ICD-10-CM | POA: Diagnosis not present

## 2017-10-05 NOTE — Progress Notes (Signed)
Daily Session Note  Patient Details  Name: George Doyle MRN: [id number] Date of Birth: 09-Nov-1950 Referring Provider:     Cardiac Rehab from 09/24/2017 in [facility] Cardiac and Pulmonary Rehab  Referring Provider  Ida Rogue MD      Encounter Date: 10/05/2017  Check In: Session Check In - 10/05/17 0834      Check-In   Location  [facility]iac [facility] Re[facility]    Staff Present  Alberteen Sam, MA, RCEP, CCRP, Exercise Physiologist;Meredith Sherryll Burger, RN Vickki Hearing, BA, ACSM CEP, Exercise Physiologist    Supervising physician immediately available to respond to emergencies  See telemetry face sheet for immediately available ER MD    Medication changes reported      No    Fall or balance concerns reported     No    Warm-up and Cool-down  Performed on first and last piece of equipment    Resistance Training Performed  Yes    VAD Patient?  No      Pain Assessment   Currently in Pain?  No/denies    Multiple Pain Sites  No          Social History   Tobacco Use  Smoking Status Former Smoker  Smokeless Tobacco Never Used  Tobacco Comment   quit over 20 yrs ago    Goals Met:  Independence with exercise equipment Exercise tolerated well No report of cardiac concerns or symptoms Strength training completed today  Goals Unmet:  Not Applicable  Comments: Pt able to follow exercise prescription today without complaint.  Will continue to monitor for progression.    Dr. Emily Filbert is Medical Director for [place] and [facility].

## 2017-10-08 ENCOUNTER — Encounter: Payer: Medicare HMO | Admitting: *Deleted

## 2017-10-08 DIAGNOSIS — I214 Non-ST elevation (NSTEMI) myocardial infarction: Secondary | ICD-10-CM | POA: Diagnosis not present

## 2017-10-08 NOTE — Progress Notes (Signed)
Daily Session Note  Patient Details  Name: George Doyle MRN: [id number] Date of Birth: 05-08-1951 Referring Provider:     Cardiac Rehab from 09/24/2017 in [facility] Cardiac and Pulmonary Rehab  Referring Provider  Ida Rogue MD      Encounter Date: 10/08/2017  Check In: Session Check In - 10/08/17 0743      Check-In   Location  [facility]    Staff Present  Renita Papa, RN Moises Blood, BS, ACSM CEP, Exercise Physiologist;Betty Brooks Luan Pulling, [place], RCEP, CCRP, Exercise Physiologist    Supervising physician immediately available to respond to emergencies  See telemetry face sheet for immediately available ER MD    Medication changes reported      No    Fall or balance concerns reported     No    Warm-up and Cool-down  Performed on first and last piece of equipment    Resistance Training Performed  Yes    VAD Patient?  No      Pain Assessment   Currently in Pain?  No/denies          Social History   Tobacco Use  Smoking Status Former Smoker  Smokeless Tobacco Never Used  Tobacco Comment   quit over 20 yrs ago    Goals Met:  Independence with exercise equipment Exercise tolerated well No report of cardiac concerns or symptoms Strength training completed today  Goals Unmet:  Not Applicable  Comments: Pt able to follow exercise prescription today without complaint.  Will continue to monitor for progression.    Dr. Emily Filbert is Medical Director for [place] and [facility].

## 2017-10-11 DIAGNOSIS — R69 Illness, unspecified: Secondary | ICD-10-CM | POA: Diagnosis not present

## 2017-10-12 DIAGNOSIS — I214 Non-ST elevation (NSTEMI) myocardial infarction: Secondary | ICD-10-CM

## 2017-10-12 NOTE — Progress Notes (Signed)
Daily Session Note  Patient Details  Name: George Doyle MRN: [id number] Date of Birth: March 14, 1951 Referring Provider:     Cardiac Rehab from 09/24/2017 in [facility] Cardiac and Pulmonary Rehab  Referring Provider  Ida Rogue MD      Encounter Date: 10/12/2017  Check In: Session Check In - 10/12/17 0848      Check-In   Location  [facility] Re[facility]    Staff Present  [place], BS, PEC;Nada Maclachlan, BA, ACSM CEP, Exercise Physiologist;Susanne Bice, RN, BSN, CCRP    Supervising physician immediately available to respond to emergencies  See telemetry face sheet for immediately available ER MD    Medication changes reported      No    Fall or balance concerns reported     No    Tobacco Cessation  No Change    Warm-up and Cool-down  Performed on first and last piece of equipment    Resistance Training Performed  Yes    VAD Patient?  No      Pain Assessment   Currently in Pain?  No/denies    Multiple Pain Sites  No          Social History   Tobacco Use  Smoking Status Former Smoker  Smokeless Tobacco Never Used  Tobacco Comment   quit over 20 yrs ago    Goals Met:  Independence with exercise equipment Exercise tolerated well No report of cardiac concerns or symptoms Strength training completed today  Goals Unmet:  Not Applicable  Comments: Pt able to follow exercise prescription today without complaint.  Will continue to monitor for progression.    Dr. Emily Filbert is Medical Director for [place] and [facility].

## 2017-10-13 ENCOUNTER — Other Ambulatory Visit
Admission: RE | Admit: 2017-10-13 | Discharge: 2017-10-13 | Disposition: A | Discharge status: Home / Self Care | Payer: Medicare HMO | Source: Ambulatory Visit | Attending: Cardiovascular Disease | Admitting: Cardiovascular Disease

## 2017-10-13 DIAGNOSIS — Z79899 Other long term (current) drug therapy: Secondary | ICD-10-CM | POA: Insufficient documentation

## 2017-10-13 DIAGNOSIS — E782 Mixed hyperlipidemia: Secondary | ICD-10-CM | POA: Insufficient documentation

## 2017-10-13 LAB — LIPID PANEL
Cholesterol: 113 mg/dL (ref 0–200)
HDL: 59 mg/dL (ref >40)
LDL CALC: 44 mg/dL (ref 0–99)
TRIGLYCERIDES: 48 mg/dL (ref <150)
Total CHOL/HDL Ratio: 1.9 RATIO
VLDL: 10 mg/dL (ref 0–40)

## 2017-10-13 LAB — HEPATIC FUNCTION PANEL
ALBUMIN: 4 g/dL (ref 3.5–5.0)
ALK PHOS: 81 U/L (ref 38–126)
ALT: 12 U/L — AB (ref 17–63)
AST: 24 U/L (ref 15–41)
BILIRUBIN TOTAL: 1.1 mg/dL (ref 0.3–1.2)
Bilirubin, Direct: 0.2 mg/dL (ref 0.1–0.5)
Indirect Bilirubin: 0.9 mg/dL (ref 0.3–0.9)
Total Protein: 6.9 g/dL (ref 6.5–8.1)

## 2017-10-15 ENCOUNTER — Encounter: Payer: Medicare HMO | Admitting: *Deleted

## 2017-10-15 DIAGNOSIS — I214 Non-ST elevation (NSTEMI) myocardial infarction: Secondary | ICD-10-CM

## 2017-10-15 NOTE — Progress Notes (Signed)
Daily Session Note  Patient Details  Name: George Doyle MRN: [id number] Date of Birth: October 02, 1950 Referring Provider:     Cardiac Rehab from 09/24/2017 in [facility] Cardiac and Pulmonary Rehab  Referring Provider  Ida Rogue MD      Encounter Date: 10/15/2017  Check In: Session Check In - 10/15/17 0734      Check-In   Location  [facility] Re[facility]    Staff Present  Alberteen Sam, MA, RCEP, CCRP, Exercise Physiologist;Kelly Amedeo Plenty, BS, ACSM CEP, Exercise Physiologist;Krista Frederico Hamman, RN BSN    Supervising physician immediately available to respond to emergencies  See telemetry face sheet for immediately available ER MD    Medication changes reported      No    Fall or balance concerns reported     No    Tobacco Cessation  No Change    Warm-up and Cool-down  Performed on first and last piece of equipment    Resistance Training Performed  Yes    VAD Patient?  No      Pain Assessment   Currently in Pain?  No/denies    Multiple Pain Sites  No          Social History   Tobacco Use  Smoking Status Former Smoker  Smokeless Tobacco Never Used  Tobacco Comment   quit over 20 yrs ago    Goals Met:  Exercise tolerated well No report of cardiac concerns or symptoms Strength training completed today  Goals Unmet:  Not Applicable  Comments: Pt able to follow exercise prescription today without complaint.  Will continue to monitor for progression. Reviewed home exercise with pt today.  Pt plans to walk at home for exercise.  Reviewed THR, pulse, RPE, sign and symptoms, and when to call 911 or MD.  Also discussed weather considerations and indoor options.  Pt voiced understanding.    Dr. Emily Filbert is Medical Director for [place] and [facility].

## 2017-10-17 ENCOUNTER — Encounter: Payer: Self-pay | Admitting: *Deleted

## 2017-10-17 DIAGNOSIS — I214 Non-ST elevation (NSTEMI) myocardial infarction: Secondary | ICD-10-CM

## 2017-10-17 NOTE — Progress Notes (Signed)
Daily Session Note  Patient Details  Name: George Doyle MRN: [id number] Date of Birth: [birth year] Referring Provider:     Cardiac Rehab from 09/24/2017 in [facility] Cardiac and Pulmonary Rehab  Referring Provider  Ida Rogue MD      Encounter Date: 10/17/2017  Check In: Session Check In - 10/17/17 0724      Check-In   Location  [facility] Re[facility]    Staff Present  Justin Mend RCP,RRT,BSRT;Jessica Luan Pulling, MA, RCEP, CCRP, Exercise Physiologist;Susanne Bice, RN, BSN, CCRP    Supervising physician immediately available to respond to emergencies  See telemetry face sheet for immediately available ER MD    Medication changes reported      No    Fall or balance concerns reported     No    Warm-up and Cool-down  Performed on first and last piece of equipment    Resistance Training Performed  Yes    VAD Patient?  No      Pain Assessment   Currently in Pain?  No/denies          Social History   Tobacco Use  Smoking Status Former Smoker  Smokeless Tobacco Never Used  Tobacco Comment   quit over 20 yrs ago    Goals Met:  Independence with exercise equipment Exercise tolerated well No report of cardiac concerns or symptoms Strength training completed today  Goals Unmet:  Not Applicable  Comments: Pt able to follow exercise prescription today without complaint.  Will continue to monitor for progression.   Dr. Emily Filbert is Medical Director for [place] and [facility].

## 2017-10-17 NOTE — Progress Notes (Signed)
Cardiac Individual Treatment Plan  Patient Details  Name: George Doyle MRN: [id number] Date of Birth: [birth year] Referring Provider:     Cardiac Rehab from 09/24/2017 in [facility] Cardiac and Pulmonary Rehab  Referring Provider  Ida Rogue MD      Initial Encounter Date:    Cardiac Rehab from 09/24/2017 in [facility] Cardiac and Pulmonary Rehab  Date  09/24/17  Referring Provider  Ida Rogue MD      Visit Diagnosis: NSTEMI (non-ST elevated myocardial infarction) [facility])  Patient's Home Medications on Admission:  Current Outpatient Medications:  .  aspirin EC 81 MG EC tablet, Take 1 tablet (81 mg total) daily by mouth., Disp: 30 tablet, Rfl: 1 .  atorvastatin (LIPITOR) 80 MG tablet, Take 1 tablet (80 mg total) daily at 6 PM by mouth., Disp: 90 tablet, Rfl: 3 .  clopidogrel (PLAVIX) 75 MG tablet, Take 1 tablet (75 mg total) daily by mouth., Disp: 90 tablet, Rfl: 3 .  metoprolol tartrate (LOPRESSOR) 25 MG tablet, Take 1 tablet (25 mg total) 2 (two) times daily by mouth., Disp: 180 tablet, Rfl: 3 .  nitroGLYCERIN (NITROSTAT) 0.4 MG SL tablet, Place 1 tablet (0.4 mg total) every 5 (five) minutes x 3 doses as needed under the tongue for chest pain., Disp: 30 tablet, Rfl: 2 .  ramipril (ALTACE) 10 MG capsule, Take 2 capsules (20 mg total) daily by mouth., Disp: 180 capsule, Rfl: 3  Past Medical History: Past Medical History:  Diagnosis Date  . Allergy   . Arthritis    knees, hands  . Hypertension   . Seizures ([place])    approx 25+ yrs ago.  Testing showed no cause. None since    Tobacco Use: Social History   Tobacco Use  Smoking Status Former Smoker  Smokeless Tobacco Never Used  Tobacco Comment   quit over 20 yrs ago    Labs: Recent Chemical engineer    Labs for ITP Cardiac and Pulmonary Rehab Latest Ref Rng & Units 07/12/2015 03/03/2016 10/12/2016 05/10/2017 10/13/2017   Cholestrol 0 - 200 mg/dL 229(H) 201(H) 223(H) 202(H) 113   LDLCALC 0 - 99 mg/dL 140(H) 120(H)  136(H) 123(H) 44   HDL >40 mg/dL 56 63 64 55 59   Trlycerides <150 mg/dL 164(H) 90 113 118 48       Exercise Target Goals:    Exercise Program Goal: Individual exercise prescription set using results from initial 6 min walk test and THRR while considering  patient's activity barriers and safety.   Exercise Prescription Goal: Initial exercise prescription builds to 30-45 minutes a day of aerobic activity, 2-3 days per week.  Home exercise guidelines will be given to patient during program as part of exercise prescription that the participant will acknowledge.  Activity Barriers & Risk Stratification: Activity Barriers & Cardiac Risk Stratification - 09/24/17 1309      Activity Barriers & Cardiac Risk Stratification   Activity Barriers  Arthritis;Back Problems;Shortness of Breath;Other (comment);Deconditioning;Joint Problems    Comments  Arthritis in both hands, he has L2/L3 ongoing issues in his back. States he has had scope surgery on bilateral knees. They sometimes give out on him for no reason and have caused him to fall in the past.     Cardiac Risk Stratification  High       6 Minute Walk: 6 Minute Walk    Row Name 09/24/17 1354         6 Minute Walk   Phase  Initial  Distance  1365 feet     Walk Time  6 minutes     # of Rest Breaks  0     MPH  2.56     METS  3.37     RPE  11     Perceived Dyspnea   3     VO2 Peak  11.79     Symptoms  Yes (comment)     Comments  calves (7/10), SOB, knee pain (1/10)     Resting HR  61 bpm     Resting BP  126/56     Resting Oxygen Saturation   100 %     Exercise Oxygen Saturation  during 6 min walk  100 %     Max Ex. HR  61 bpm     Max Ex. BP  126/56     2 Minute Post BP  146/72        Oxygen Initial Assessment:   Oxygen Re-Evaluation:   Oxygen Discharge (Final Oxygen Re-Evaluation):   Initial Exercise Prescription: Initial Exercise Prescription - 09/24/17 1400      Date of Initial Exercise RX and Referring  Provider   Date  09/24/17    Referring Provider  Ida Rogue MD      Treadmill   MPH  2.5    Grade  1    Minutes  15    METs  3.26      NuStep   Level  3    SPM  80    Minutes  15    METs  3      Arm Ergometer   Level  3    RPM  30    Minutes  15    METs  2      Prescription Details   Frequency (times per week)  3    Duration  Progress to 45 minutes of aerobic exercise without signs/symptoms of physical distress      Intensity   THRR 40-80% of Max Heartrate  97-134    Ratings of Perceived Exertion  11-13    Perceived Dyspnea  0-4      Progression   Progression  Continue to progress workloads to maintain intensity without signs/symptoms of physical distress.      Resistance Training   Training Prescription  Yes    Weight  3 lbs    Reps  10-15       Perform Capillary Blood Glucose checks as needed.  Exercise Prescription Changes: Exercise Prescription Changes    Row Name 09/24/17 1300 09/28/17 1400 10/08/17 1600 10/15/17 0800       Response to Exercise   Blood Pressure (Admit)  126/56  122/66  132/64  -    Blood Pressure (Exercise)  164/92  124/66  128/70  -    Blood Pressure (Exit)  146/72  126/82  114/66  -    Heart Rate (Admit)  61 bpm  75 bpm  77 bpm  -    Heart Rate (Exercise)  106 bpm  121 bpm  107 bpm  -    Heart Rate (Exit)  69 bpm  73 bpm  76 bpm  -    Oxygen Saturation (Admit)  100 %  -  -  -    Oxygen Saturation (Exercise)  100 %  -  -  -    Rating of Perceived Exertion (Exercise)  _0 -    Perceived Dyspnea (Exercise)  3  -  -  -  Symptoms  calves burning (7/10), SOB, knee pain (1/10)  none  none  -    Comments  walk test results  second full day of exercise  -  -    Duration  -  Continue with 45 min of aerobic exercise without signs/symptoms of physical distress.  Continue with 45 min of aerobic exercise without signs/symptoms of physical distress.  -    Intensity  -  THRR unchanged  THRR unchanged  -      Progression    Progression  -  Continue to progress workloads to maintain intensity without signs/symptoms of physical distress.  Continue to progress workloads to maintain intensity without signs/symptoms of physical distress.  -    Average METs  -  2.52  3.05  -      Resistance Training   Training Prescription  -  Yes  Yes  -    Weight  -  3 lbs  3 lbs  -    Reps  -  10-15  10-15  -      Interval Training   Interval Training  -  No  No  -      Treadmill   MPH  -  2.5  2.5  -    Grade  -  1  1  -    Minutes  -  15  15  -    METs  -  3.26  3.26  -      NuStep   Level  -  3  4  -    Minutes  -  15  15  -    METs  -  2  3  -      Arm Ergometer   Level  -  3  3  -    Minutes  -  15  15  -    METs  -  2.3  2.9  -      Home Exercise Plan   Plans to continue exercise at  -  -  -  Home (comment) walking    Frequency  -  -  -  Add 3 additional days to program exercise sessions.    Initial Home Exercises Provided  -  -  -  10/15/17       Exercise Comments: Exercise Comments    Row Name 09/26/17 0803           Exercise Comments  First full day of exercise!  Patient was oriented to gym and equipment including functions, settings, policies, and procedures.  Patient's individual exercise prescription and treatment plan were reviewed.  All starting workloads were established based on the results of the 6 minute walk test done at initial orientation visit.  The plan for exercise progression was also introduced and progression will be customized based on patient's performance and goals          Exercise Goals and Review: Exercise Goals    Row Name 09/24/17 1410             Exercise Goals   Increase Physical Activity  Yes       Intervention  Provide advice, education, support and counseling about physical activity/exercise needs.;Develop an individualized exercise prescription for aerobic and resistive training based on initial evaluation findings, risk stratification, comorbidities and  participant's personal goals.       Expected Outcomes  Achievement of increased cardiorespiratory fitness and enhanced flexibility, muscular endurance and strength shown through measurements  of functional capacity and personal statement of participant.       Increase Strength and Stamina  Yes       Intervention  Provide advice, education, support and counseling about physical activity/exercise needs.;Develop an individualized exercise prescription for aerobic and resistive training based on initial evaluation findings, risk stratification, comorbidities and participant's personal goals.       Expected Outcomes  Achievement of increased cardiorespiratory fitness and enhanced flexibility, muscular endurance and strength shown through measurements of functional capacity and personal statement of participant.       Able to understand and use rate of perceived exertion (RPE) scale  Yes       Intervention  Provide education and explanation on how to use RPE scale       Expected Outcomes  Short Term: Able to use RPE daily in rehab to express subjective intensity level;Long Term:  Able to use RPE to guide intensity level when exercising independently       Knowledge and understanding of Target Heart Rate Range (THRR)  Yes       Intervention  Provide education and explanation of THRR including how the numbers were predicted and where they are located for reference       Expected Outcomes  Short Term: Able to state/look up THRR;Long Term: Able to use THRR to govern intensity when exercising independently;Short Term: Able to use daily as guideline for intensity in rehab       Able to check pulse independently  Yes       Intervention  Provide education and demonstration on how to check pulse in carotid and radial arteries.;Review the importance of being able to check your own pulse for safety during independent exercise       Expected Outcomes  Short Term: Able to explain why pulse checking is important during  independent exercise;Long Term: Able to check pulse independently and accurately       Understanding of Exercise Prescription  Yes       Intervention  Provide education, explanation, and written materials on patient's individual exercise prescription       Expected Outcomes  Short Term: Able to explain program exercise prescription;Long Term: Able to explain home exercise prescription to exercise independently          Exercise Goals Re-Evaluation : Exercise Goals Re-Evaluation    Row Name 09/26/17 0803 09/28/17 1427 10/08/17 1605 10/15/17 0842       Exercise Goal Re-Evaluation   Exercise Goals Review  Knowledge and understanding of Target Heart Rate Range (THRR);Able to understand and use rate of perceived exertion (RPE) scale;Understanding of Exercise Prescription  Increase Physical Activity;Understanding of Exercise Prescription;Increase Strength and Stamina  Increase Physical Activity;Understanding of Exercise Prescription;Increase Strength and Stamina  Understanding of Exercise Prescription;Knowledge and understanding of Target Heart Rate Range (THRR);Able to understand and use rate of perceived exertion (RPE) scale;Able to check pulse independently    Comments  Reviewed RPE scale, THR and program prescription with pt today.  Pt voiced understanding and was given a copy of goals to take home.   Sylvio is off to a good start in rehab.  He has completed his first two full days of exercise.  He has done with his beginning workloads and will start to increase his workloads next week. We will continue to monitor his progression.   Wendy continues to do well in rehab.  He is now up to level 3 on the arm crank and level 4 on the NuStep.  We will continue to monitor his progression.   Reviewed home exercise with pt today.  Pt plans to walk at home for exercise.  Reviewed THR, pulse, RPE, sign and symptoms, and when to call 911 or MD.  Also discussed weather considerations and indoor options.  Pt voiced  understanding.    Expected Outcomes  Short: Use RPE daily to regulate intensity.  Long: Follow program prescription in THR.  Short: Review home exercise guideline and increase workloads.  Long: Continue to attend classes regularly.   Short: Review home exercise guideline and increase workloads.  Long: Continue to exercise routinely.   Short: Start walking on off days at home.  Long: Exercise more indepedently.       Discharge Exercise Prescription (Final Exercise Prescription Changes): Exercise Prescription Changes - 10/15/17 0800      Home Exercise Plan   Plans to continue exercise at  Home (comment) walking    Frequency  Add 3 additional days to program exercise sessions.    Initial Home Exercises Provided  10/15/17       Nutrition:  Target Goals: Understanding of nutrition guidelines, daily intake of sodium <1546m, cholesterol <2065m calories 30% from fat and 7% or less from saturated fats, daily to have 5 or more servings of fruits and vegetables.  Biometrics: Pre Biometrics - 09/24/17 1414      Pre Biometrics   Height  5' 10.5" (1.791 m)    Weight  208 lb 6.4 oz (94.5 kg)    Waist Circumference  42.5 inches    Hip Circumference  39.75 inches    Waist to Hip Ratio  1.07 %    BMI (Calculated)  29.47    Single Leg Stand  2.27 seconds        Nutrition Therapy Plan and Nutrition Goals: Nutrition Therapy & Goals - 10/03/17 0942      Nutrition Therapy   Diet  TLC    Drug/Food Interactions  Statins/Certain Fruits    Protein (specify units)  9oz    Fiber  35 grams    Saturated Fats  15 max. grams    Sodium  1500 grams      Personal Nutrition Goals   Nutrition Goal  Add more fruits to the diet    Personal Goal #2  Decrease portion sizes at meal times, particularly when eating out    Personal Goal #3  Keep consumption of fried foods to a minimum    Comments  wife cooks most meals, which are balanced and whole-food based. he will occasionally "cheat" on high fat, fried  foods but does not eat candy or snack between meals often      Intervention Plan   Intervention  Prescribe, educate and counsel regarding individualized specific dietary modifications aiming towards targeted core components such as weight, hypertension, lipid management, diabetes, heart failure and other comorbidities.    Expected Outcomes  Short Term Goal: Understand basic principles of dietary content, such as calories, fat, sodium, cholesterol and nutrients.;Short Term Goal: A plan has been developed with personal nutrition goals set during dietitian appointment.;Long Term Goal: Adherence to prescribed nutrition plan.       Nutrition Assessments: Nutrition Assessments - 09/24/17 1306      MEDFICTS Scores   Pre Score  69       Nutrition Goals Re-Evaluation:   Nutrition Goals Discharge (Final Nutrition Goals Re-Evaluation):   Psychosocial: Target Goals: Acknowledge presence or absence of significant depression and/or stress, maximize coping skills, provide positive support  system. Participant is able to verbalize types and ability to use techniques and skills needed for reducing stress and depression.   Initial Review & Psychosocial Screening: Initial Psych Review & Screening - 09/24/17 1306      Initial Review   Current issues with  Current Stress Concerns    Source of Stress Concerns  Unable to participate in former interests or hobbies;Unable to perform yard/household activities    Comments  He would like to be able to do house and yard work without getting SOB. He also would like to feel less fear when doing things that exert more energy.       Family Dynamics   Good Support System?  Yes    Comments  wife/family      Barriers   Psychosocial barriers to participate in program  The patient should benefit from training in stress management and relaxation.      Screening Interventions   Interventions  Yes;Encouraged to exercise;Program counselor consult;Provide feedback  about the scores to participant;To provide support and resources with identified psychosocial needs    Expected Outcomes  Short Term goal: Utilizing psychosocial counselor, staff and physician to assist with identification of specific Stressors or current issues interfering with healing process. Setting desired goal for each stressor or current issue identified.;Long Term Goal: Stressors or current issues are controlled or eliminated.;Short Term goal: Identification and review with participant of any Quality of Life or Depression concerns found by scoring the questionnaire.;Long Term goal: The participant improves quality of Life and PHQ9 Scores as seen by post scores and/or verbalization of changes       Quality of Life Scores:  Quality of Life - 09/24/17 1046      Quality of Life Scores   Health/Function Pre  20.73 %    Socioeconomic Pre  19.86 %    Psych/Spiritual Pre  20.57 %    Family Pre  21 %    GLOBAL Pre  20.56 %      Scores of 19 and below usually indicate a poorer quality of life in these areas.  A difference of  2-3 points is a clinically meaningful difference.  A difference of 2-3 points in the total score of the Quality of Life Index has been associated with significant improvement in overall quality of life, self-image, physical symptoms, and general health in studies assessing change in quality of life.  PHQ-9: Recent Review Flowsheet Data    Depression screen [facility] 2/9 09/24/2017 05/10/2017 03/03/2016   Decreased Interest 1 0 0   Down, Depressed, Hopeless 0 0 0   PHQ - 2 Score 1 0 0   Altered sleeping 0 0 -   Tired, decreased energy 1 0 -   Change in appetite 0 0 -   Feeling bad or failure about yourself  0 0 -   Trouble concentrating 0 0 -   Moving slowly or fidgety/restless 1 0 -   Suicidal thoughts 0 0 -   PHQ-9 Score 3 0 -   Difficult doing work/chores Somewhat difficult - -     Interpretation of Total Score  Total Score Depression Severity:  1-4 = Minimal  depression, 5-9 = Mild depression, 10-14 = Moderate depression, 15-19 = Moderately severe depression, 20-27 = Severe depression   Psychosocial Evaluation and Intervention: Psychosocial Evaluation - 09/26/17 0949      Psychosocial Evaluation & Interventions   Interventions  Encouraged to exercise with the program and follow exercise prescription    Comments  Counselor met with Mr. Mcneel Kille) today for initial psychosocial evaluation.  He is a 67 year old who had a heart attack at the end of October.  He has a strong support system with a spouse of over 84 years, and a daughter and son who live close by.  Teja has some arthritis and back problems in addition to his cardiac health issues.  He sleeps well and has a good appetite.  Benuel denies a history of depression or anxiety or any current symptoms.  He has minimal stress in his life and is typically in a positive mood.  Eldwin has goals to increase his strength and stamina while in this program and he hopes to improve his breathing as well.  He will be followed by staff throughout the course of this program.      Expected Outcomes  Latham will benefit from consistent exercise to achieve his stated goals.  The educational and psychoeducational components of this program will be helpful in understanding and coping more positively with his condition.         Psychosocial Re-Evaluation:   Psychosocial Discharge (Final Psychosocial Re-Evaluation):   Vocational Rehabilitation: Provide vocational rehab assistance to qualifying candidates.   Vocational Rehab Evaluation & Intervention: Vocational Rehab - 09/24/17 1311      Initial Vocational Rehab Evaluation & Intervention   Assessment shows need for Vocational Rehabilitation  No       Education: Education Goals: Education classes will be provided on a variety of topics geared toward better understanding of heart health and risk factor modification. Participant will state  understanding/return demonstration of topics presented as noted by education test scores.  Learning Barriers/Preferences: Learning Barriers/Preferences - 09/24/17 1311      Learning Barriers/Preferences   Learning Barriers  None    Learning Preferences  None       Education Topics:  AED/CPR: - Group verbal and written instruction with the use of models to demonstrate the basic use of the AED with the basic ABC's of resuscitation.   General Nutrition Guidelines/Fats and Fiber: -Group instruction provided by verbal, written material, models and posters to present the general guidelines for heart healthy nutrition. Gives an explanation and review of dietary fats and fiber.   Cardiac Rehab from 10/15/2017 in [facility] Cardiac and Pulmonary Rehab  Date  10/15/17  Educator  CR  Instruction Review Code  1- Verbalizes Understanding      Controlling Sodium/Reading Food Labels: -Group verbal and written material supporting the discussion of sodium use in heart healthy nutrition. Review and explanation with models, verbal and written materials for utilization of the food label.   Exercise Physiology & General Exercise Guidelines: - Group verbal and written instruction with models to review the exercise physiology of the cardiovascular system and associated critical values. Provides general exercise guidelines with specific guidelines to those with heart or lung disease.    Aerobic Exercise & Resistance Training: - Gives group verbal and written instruction on the various components of exercise. Focuses on aerobic and resistive training programs and the benefits of this training and how to safely progress through these programs..   Flexibility, Balance, Mind/Body Relaxation: Provides group verbal/written instruction on the benefits of flexibility and balance training, including mind/body exercise modes such as yoga, pilates and tai chi.  Demonstration and skill practice provided.   Stress and  Anxiety: - Provides group verbal and written instruction about the health risks of elevated stress and causes of high stress.  Discuss the correlation  between heart/lung disease and anxiety and treatment options. Review healthy ways to manage with stress and anxiety.   Depression: - Provides group verbal and written instruction on the correlation between heart/lung disease and depressed mood, treatment options, and the stigmas associated with seeking treatment.   Anatomy & Physiology of the Heart: - Group verbal and written instruction and models provide basic cardiac anatomy and physiology, with the coronary electrical and arterial systems. Review of Valvular disease and Heart Failure   Cardiac Rehab from 10/15/2017 in [facility] Cardiac and Pulmonary Rehab  Date  10/08/17  Educator  [facility]  Instruction Review Code  1- Verbalizes Understanding      Cardiac Procedures: - Group verbal and written instruction to review commonly prescribed medications for heart disease. Reviews the medication, class of the drug, and side effects. Includes the steps to properly store meds and maintain the prescription regimen. (beta blockers and nitrates)   Cardiac Medications I: - Group verbal and written instruction to review commonly prescribed medications for heart disease. Reviews the medication, class of the drug, and side effects. Includes the steps to properly store meds and maintain the prescription regimen.   Cardiac Rehab from 10/15/2017 in [facility] Cardiac and Pulmonary Rehab  Date  10/01/17  Educator  [place]  Instruction Review Code  1- Verbalizes Understanding      Cardiac Medications II: -Group verbal and written instruction to review commonly prescribed medications for heart disease. Reviews the medication, class of the drug, and side effects. (all other drug classes)    Go Sex-Intimacy & Heart Disease, Get SMART - Goal Setting: - Group verbal and written instruction through game format to discuss heart  disease and the return to sexual intimacy. Provides group verbal and written material to discuss and apply goal setting through the application of the S.M.A.R.T. Method.   Other Matters of the Heart: - Provides group verbal, written materials and models to describe Stable Angina and Peripheral Artery. Includes description of the disease process and treatment options available to the cardiac patient.   Exercise & Equipment Safety: - Individual verbal instruction and demonstration of equipment use and safety with use of the equipment.   Cardiac Rehab from 10/15/2017 in [facility] Cardiac and Pulmonary Rehab  Date  09/24/17  Educator  [place]  Instruction Review Code  1- Verbalizes Understanding      Infection Prevention: - Provides verbal and written material to individual with discussion of infection control including proper hand washing and proper equipment cleaning during exercise session.   Cardiac Rehab from 10/15/2017 in [facility] Cardiac and Pulmonary Rehab  Date  09/24/17  Educator  [place]  Instruction Review Code  1- Verbalizes Understanding      Falls Prevention: - Provides verbal and written material to individual with discussion of falls prevention and safety.   Cardiac Rehab from 10/15/2017 in [facility] Cardiac and Pulmonary Rehab  Date  09/24/17  Educator  [place]  Instruction Review Code  1- Verbalizes Understanding      Diabetes: - Individual verbal and written instruction to review signs/symptoms of diabetes, desired ranges of glucose level fasting, after meals and with exercise. Acknowledge that pre and post exercise glucose checks will be done for 3 sessions at entry of program.   Know Your Numbers and Risk Factors: -Group verbal and written instruction about important numbers in your health.  Discussion of what are risk factors and how they play a role in the disease process.  Review of Cholesterol, Blood Pressure, Diabetes, and BMI and the  role they play in your overall health.   Sleep  Hygiene: -Provides group verbal and written instruction about how sleep can affect your health.  Define sleep hygiene, discuss sleep cycles and impact of sleep habits. Review good sleep hygiene tips.    Other: -Provides group and verbal instruction on various topics (see comments)   Cardiac Rehab from 10/15/2017 in [facility] Cardiac and Pulmonary Rehab  Date  09/26/17  Educator  [facility]  Instruction Review Code  1- Verbalizes Understanding [Risk Factrors and Know your Numbers]      Knowledge Questionnaire Score: Knowledge Questionnaire Score - 09/24/17 1047      Knowledge Questionnaire Score   Pre Score  24/28 reviewed answers with patient, who verbalized understanding.        Core Components/Risk Factors/Patient Goals at Admission: Personal Goals and Risk Factors at Admission - 09/24/17 1303      Core Components/Risk Factors/Patient Goals on Admission    Weight Management  Yes;Obesity;Weight Loss    Intervention  Weight Management: Develop a combined nutrition and exercise program designed to reach desired caloric intake, while maintaining appropriate intake of nutrient and fiber, sodium and fats, and appropriate energy expenditure required for the weight goal.;Weight Management: Provide education and appropriate resources to help participant work on and attain dietary goals.;Weight Management/Obesity: Establish reasonable short term and long term weight goals.;Obesity: Provide education and appropriate resources to help participant work on and attain dietary goals.    Admit Weight  208 lb 6.4 oz (94.5 kg)    Goal Weight: Short Term  205 lb (93 kg)    Goal Weight: Long Term  190 lb (86.2 kg)    Expected Outcomes  Short Term: Continue to assess and modify interventions until short term weight is achieved;Long Term: Adherence to nutrition and physical activity/exercise program aimed toward attainment of established weight goal;Weight Loss: Understanding of general recommendations for a balanced  deficit meal plan, which promotes 1-2 lb weight loss per week and includes a negative energy balance of [phone] kcal/d;Understanding recommendations for meals to include 15-35% energy as protein, 25-35% energy from fat, 35-60% energy from carbohydrates, less than 244m of dietary cholesterol, 20-35 gm of total fiber daily;Understanding of distribution of calorie intake throughout the day with the consumption of 4-5 meals/snacks    Improve shortness of breath with ADL's  Yes states he gets SOB a lot with minimal exertion. Is worried b/c SOB is what he had when he was having his heart attack in the middle of the night.     Intervention  Provide education, individualized exercise plan and daily activity instruction to help decrease symptoms of SOB with activities of daily living.    Expected Outcomes  Short Term: Achieves a reduction of symptoms when performing activities of daily living.    Hypertension  Yes    Intervention  Provide education on lifestyle modifcations including regular physical activity/exercise, weight management, moderate sodium restriction and increased consumption of fresh fruit, vegetables, and low fat dairy, alcohol moderation, and smoking cessation.;Monitor prescription use compliance.    Expected Outcomes  Short Term: Continued assessment and intervention until BP is < 140/974mHG in hypertensive participants. < 130/8052mG in hypertensive participants with diabetes, heart failure or chronic kidney disease.;Long Term: Maintenance of blood pressure at goal levels.    Lipids  Yes    Intervention  Provide education and support for participant on nutrition & aerobic/resistive exercise along with prescribed medications to achieve LDL <41m38mDL >40mg65m Expected Outcomes  Short Term: Participant states understanding of desired cholesterol values and is compliant with medications prescribed. Participant is following exercise prescription and nutrition guidelines.;Long Term: Cholesterol  controlled with medications as prescribed, with individualized exercise RX and with personalized nutrition plan. Value goals: LDL < 59m, HDL > 40 mg.    Stress  Yes concerns over his health and what he is and is not allowed to do    Intervention  Offer individual and/or small group education and counseling on adjustment to heart disease, stress management and health-related lifestyle change. Teach and support self-help strategies.;Refer participants experiencing significant psychosocial distress to appropriate mental health specialists for further evaluation and treatment. When possible, include family members and significant others in education/counseling sessions.    Expected Outcomes  Short Term: Participant demonstrates changes in health-related behavior, relaxation and other stress management skills, ability to obtain effective social support, and compliance with psychotropic medications if prescribed.;Long Term: Emotional wellbeing is indicated by absence of clinically significant psychosocial distress or social isolation.       Core Components/Risk Factors/Patient Goals Review:    Core Components/Risk Factors/Patient Goals at Discharge (Final Review):    ITP Comments: ITP Comments    Row Name 09/24/17 1045 10/17/17 0602         ITP Comments  Med Review completed. Initial ITP created. [facility]  30 Day review. Continue with ITP unless directed changes per Medical Director review.   New to program         Comments:

## 2017-10-19 ENCOUNTER — Encounter: Payer: Medicare HMO | Attending: Cardiovascular Disease | Admitting: *Deleted

## 2017-10-19 DIAGNOSIS — I214 Non-ST elevation (NSTEMI) myocardial infarction: Secondary | ICD-10-CM | POA: Insufficient documentation

## 2017-10-19 NOTE — Progress Notes (Signed)
Daily Session Note  Patient Details  Name: George Doyle MRN: [id number] Date of Birth: Jul 28, 1951 Referring Provider:     Cardiac Rehab from 09/24/2017 in [facility] Cardiac and Pulmonary Rehab  Referring Provider  Ida Rogue MD      Encounter Date: 10/19/2017  Check In: Session Check In - 10/19/17 0919      Check-In   Location  [facility]-Cardiac & Pulmonary Rehab    Staff Present  Alberteen Sam, MA, RCEP, CCRP, Exercise Physiologist;Meredith Sherryll Burger, RN Vickki Hearing, BA, ACSM CEP, Exercise Physiologist    Supervising physician immediately available to respond to emergencies  See telemetry face sheet for immediately available ER MD    Medication changes reported      No    Fall or balance concerns reported     No    Warm-up and Cool-down  Performed on first and last piece of equipment    Resistance Training Performed  Yes    VAD Patient?  No      Pain Assessment   Currently in Pain?  No/denies          Social History   Tobacco Use  Smoking Status Former Smoker  Smokeless Tobacco Never Used  Tobacco Comment   quit over 20 yrs ago    Goals Met:  Independence with exercise equipment Exercise tolerated well No report of cardiac concerns or symptoms Strength training completed today  Goals Unmet:  Not Applicable  Comments: Pt able to follow exercise prescription today without complaint.  Will continue to monitor for progression.    Dr. Emily Filbert is Medical Director for [place] and [facility].

## 2017-10-22 ENCOUNTER — Encounter: Payer: Medicare HMO | Admitting: *Deleted

## 2017-10-22 DIAGNOSIS — I214 Non-ST elevation (NSTEMI) myocardial infarction: Secondary | ICD-10-CM | POA: Diagnosis not present

## 2017-10-22 NOTE — Progress Notes (Signed)
Daily Session Note  Patient Details  Name: George Doyle MRN: [id number] Date of Birth: 11-15-1950 Referring Provider:     Cardiac Rehab from 09/24/2017 in [facility] Cardiac and Pulmonary Rehab  Referring Provider  Ida Rogue MD      Encounter Date: 10/22/2017  Check In: Session Check In - 10/22/17 0805      Check-In   Location  [facility]-Cardiac & Pulmonary Rehab    Staff Present  Alberteen Sam, MA, RCEP, CCRP, Exercise Physiologist;Kelly Amedeo Plenty, BS, ACSM CEP, Exercise Physiologist;Susanne Bice, RN, BSN, CCRP    Supervising physician immediately available to respond to emergencies  See telemetry face sheet for immediately available ER MD    Medication changes reported      No    Fall or balance concerns reported     No    Warm-up and Cool-down  Performed on first and last piece of equipment    Resistance Training Performed  Yes    VAD Patient?  No      Pain Assessment   Currently in Pain?  No/denies    Multiple Pain Sites  No          Social History   Tobacco Use  Smoking Status Former Smoker  Smokeless Tobacco Never Used  Tobacco Comment   quit over 20 yrs ago    Goals Met:  Independence with exercise equipment Exercise tolerated well No report of cardiac concerns or symptoms Strength training completed today  Goals Unmet:  Not Applicable  Comments: Pt able to follow exercise prescription today without complaint.  Will continue to monitor for progression.    Dr. Emily Filbert is Medical Director for [place] and [facility].

## 2017-10-24 DIAGNOSIS — I214 Non-ST elevation (NSTEMI) myocardial infarction: Secondary | ICD-10-CM

## 2017-10-24 NOTE — Progress Notes (Signed)
Daily Session Note  Patient Details  Name: George Doyle MRN: [id number] Date of Birth: Feb 24, 1951 Referring Provider:     Cardiac Rehab from 09/24/2017 in [facility] Cardiac and Pulmonary Rehab  Referring Provider  Ida Rogue MD      Encounter Date: 10/24/2017  Check In: Session Check In - 10/24/17 0741      Check-In   Location  [facility]-Cardiac & Pulmonary Rehab    Staff Present  Alberteen Sam, MA, RCEP, CCRP, Exercise Physiologist;Tylah Mancillas [place];Heath Lark, RN, BSN, CCRP    Supervising physician immediately available to respond to emergencies  See telemetry face sheet for immediately available ER MD    Medication changes reported      No    Fall or balance concerns reported     No    Warm-up and Cool-down  Performed on first and last piece of equipment    Resistance Training Performed  Yes    VAD Patient?  No      Pain Assessment   Currently in Pain?  No/denies          Social History   Tobacco Use  Smoking Status Former Smoker  Smokeless Tobacco Never Used  Tobacco Comment   quit over 20 yrs ago    Goals Met:  Independence with exercise equipment Exercise tolerated well Personal goals reviewed No report of cardiac concerns or symptoms Strength training completed today  Goals Unmet:  Not Applicable  Comments: Pt able to follow exercise prescription today without complaint.  Will continue to monitor for progression. See ITP for goal review   Dr. Emily Filbert is Medical Director for [facility] and [facility].

## 2017-10-26 DIAGNOSIS — I214 Non-ST elevation (NSTEMI) myocardial infarction: Secondary | ICD-10-CM

## 2017-10-26 NOTE — Progress Notes (Signed)
Daily Session Note  Patient Details  Name: George Doyle MRN: [id number] Date of Birth: 1951-01-07 Referring Provider:     Cardiac Rehab from 09/24/2017 in [facility] Cardiac and Pulmonary Rehab  Referring Provider  Ida Rogue MD      Encounter Date: 10/26/2017  Check In: Session Check In - 10/26/17 0851      Check-In   Location  [facility]-Cardiac & Pulmonary Rehab    Staff Present  Alberteen Sam, MA, RCEP, CCRP, Exercise Physiologist;Meredith Sherryll Burger, RN Vickki Hearing, BA, ACSM CEP, Exercise Physiologist    Supervising physician immediately available to respond to emergencies  See telemetry face sheet for immediately available ER MD    Medication changes reported      No    Fall or balance concerns reported     No    Warm-up and Cool-down  Performed on first and last piece of equipment    Resistance Training Performed  Yes    VAD Patient?  No      Pain Assessment   Currently in Pain?  No/denies    Multiple Pain Sites  No          Social History   Tobacco Use  Smoking Status Former Smoker  Smokeless Tobacco Never Used  Tobacco Comment   quit over 20 yrs ago    Goals Met:  Independence with exercise equipment Exercise tolerated well No report of cardiac concerns or symptoms Strength training completed today  Goals Unmet:  Not Applicable  Comments: Pt able to follow exercise prescription today without complaint.  Will continue to monitor for progression.    Dr. Emily Filbert is Medical Director for [place] and [facility].

## 2017-10-29 ENCOUNTER — Encounter: Payer: Medicare HMO | Admitting: *Deleted

## 2017-10-29 DIAGNOSIS — I214 Non-ST elevation (NSTEMI) myocardial infarction: Secondary | ICD-10-CM

## 2017-10-29 NOTE — Progress Notes (Signed)
Daily Session Note  Patient Details  Name: George Doyle MRN: [id number] Date of Birth: 03-14-1951 Referring Provider:     Cardiac Rehab from 09/24/2017 in [facility] Cardiac and Pulmonary Rehab  Referring Provider  Ida Rogue MD      Encounter Date: 10/29/2017  Check In: Session Check In - 10/29/17 0800      Check-In   Location  [facility]-Cardiac & Pulmonary Rehab    Staff Present  Alberteen Sam, MA, RCEP, CCRP, Exercise Physiologist;Kelly Amedeo Plenty, BS, ACSM CEP, Exercise Physiologist;Susanne Bice, RN, BSN, CCRP    Supervising physician immediately available to respond to emergencies  See telemetry face sheet for immediately available ER MD    Medication changes reported      No    Fall or balance concerns reported     No    Warm-up and Cool-down  Performed on first and last piece of equipment    Resistance Training Performed  Yes    VAD Patient?  No      Pain Assessment   Currently in Pain?  No/denies          Social History   Tobacco Use  Smoking Status Former Smoker  Smokeless Tobacco Never Used  Tobacco Comment   quit over 20 yrs ago    Goals Met:  Independence with exercise equipment Exercise tolerated well No report of cardiac concerns or symptoms Strength training completed today  Goals Unmet:  Not Applicable  Comments: Pt able to follow exercise prescription today without complaint.  Will continue to monitor for progression.    Dr. Emily Filbert is Medical Director for [place] and [facility].

## 2017-10-31 DIAGNOSIS — I214 Non-ST elevation (NSTEMI) myocardial infarction: Secondary | ICD-10-CM | POA: Diagnosis not present

## 2017-10-31 NOTE — Progress Notes (Signed)
Daily Session Note  Patient Details  Name: George Doyle MRN: [id number] Date of Birth: 04-14-1951 Referring Provider:     Cardiac Rehab from 09/24/2017 in [facility] Cardiac and Pulmonary Rehab  Referring Provider  Ida Rogue MD      Encounter Date: 10/31/2017  Check In: Session Check In - 10/31/17 0726      Check-In   Location  [facility]-Cardiac & Pulmonary Rehab    Staff Present  Justin Mend RCP,RRT,BSRT;Heath Lark, RN, BSN, CCRP;Jessica Luan Pulling, MA, RCEP, CCRP, Exercise Physiologist    Supervising physician immediately available to respond to emergencies  See telemetry face sheet for immediately available ER MD    Medication changes reported      No    Fall or balance concerns reported     No    Warm-up and Cool-down  Performed on first and last piece of equipment    Resistance Training Performed  Yes    VAD Patient?  No      Pain Assessment   Currently in Pain?  No/denies          Social History   Tobacco Use  Smoking Status Former Smoker  Smokeless Tobacco Never Used  Tobacco Comment   quit over 20 yrs ago    Goals Met:  Independence with exercise equipment Exercise tolerated well No report of cardiac concerns or symptoms Strength training completed today  Goals Unmet:  Not Applicable  Comments: Pt able to follow exercise prescription today without complaint.  Will continue to monitor for progression.   Dr. Emily Filbert is Medical Director for [place] and [facility].

## 2017-10-31 NOTE — Progress Notes (Signed)
Cardiology Office Note  Date:  11/01/2017   ID:  Oma Alpert, DOB Mar 05, 1951, MRN [id number]  PCP:  Juline Patch, MD   Chief Complaint  Patient presents with  . OTHER    3 month f/u no complaints today. Meds reviewed verbally  with pt.    HPI:  Mr. Bowdish is a pleasant 67 yo male with history of  HTN  former tobacco abuse  admitted with chest pain 07/21/2017 NSTEMI, TNT 14 present on/off for 5 days prior to admission, Cath 07/23/2017   Occluded proximal RCA, collaterals from left to right, Moderate to severe mid Left circumflex dz, tortuous vessel, calcified Moderate inferior wall hypokinesis He presents for follow-up of his coronary artery disease  Seen by Dr. Irish Lack August 30, 2017 Medical management recommended of his occluded RCA Difficult procedure, not having symptoms at the time  Walking longer distances, completed cardiac rehab 2 months approximately Wonders if he can stop rehab and to start going to the gym Less fatigue, denies chest pain on exertion, less shortness of breath Overall feels good with his decisions  Blood pressure elevated on today's visit but reports it is typically 120 at rest at home on multiple occasions.  Quick recovery in blood pressure after exercise  Previous results reviewed with him  attempt PCI to the RCA was unsuccessful, possibly went subintimal  reports blood pressure is stable, no orthostasis symptoms  Echo performed in the hospital ejection fraction was in the range of 50% to 55%. Aorta: Aortic root was mildly dilated, 3.6 cm - Ascending aorta: The ascending aorta was mildly dilated, 4.1 cm  EKG personally reviewed by myself on todays visit Shows normal sinus rhythm rate 59 bpm ST abnormality V3 through V6, 1 and aVL old inferior MI   PMH:   has a past medical history of Allergy, Arthritis, Hypertension, and Seizures ([place]).  PSH:    Past Surgical History:  Procedure Laterality Date  . COLONOSCOPY  2011    cleared for 10 yrs  . COLONOSCOPY WITH PROPOFOL N/A 11/06/2016   Procedure: COLONOSCOPY WITH PROPOFOL;  Surgeon: [name], MD;  Location: [place];  Service: Endoscopy;  Laterality: N/A;  . CORONARY STENT INTERVENTION N/A 07/23/2017   Procedure: CORONARY STENT INTERVENTION;  Surgeon: Yolonda Kida, MD;  Location: [place] CV LAB;  Service: Cardiovascular;  Laterality: N/A;  . HERNIA REPAIR    . KNEE SURGERY Bilateral    2 on R) 1 on L)  . LEFT HEART CATH AND CORONARY ANGIOGRAPHY N/A 07/23/2017   Procedure: LEFT HEART CATH AND CORONARY ANGIOGRAPHY;  Surgeon: Minna Merritts, MD;  Location: [place] CV LAB;  Service: Cardiovascular;  Laterality: N/A;  . POLYPECTOMY  11/06/2016   Procedure: POLYPECTOMY;  Surgeon: [name], MD;  Location: [place];  Service: Endoscopy;;    Current Outpatient Medications  Medication Sig Dispense Refill  . aspirin EC 81 MG EC tablet Take 1 tablet (81 mg total) daily by mouth. 30 tablet 1  . atorvastatin (LIPITOR) 80 MG tablet Take 1 tablet (80 mg total) daily at 6 PM by mouth. 90 tablet 3  . clopidogrel (PLAVIX) 75 MG tablet Take 1 tablet (75 mg total) daily by mouth. 90 tablet 3  . glucosamine-chondroitin 500-400 MG tablet Take 1 tablet by mouth 2 (two) times daily.    . metoprolol tartrate (LOPRESSOR) 25 MG tablet Take 1 tablet (25 mg total) 2 (two) times daily by mouth. 180 tablet 3  . nitroGLYCERIN (NITROSTAT)  0.4 MG SL tablet Place 1 tablet (0.4 mg total) every 5 (five) minutes x 3 doses as needed under the tongue for chest pain. 30 tablet 2  . ramipril (ALTACE) 10 MG capsule Take 2 capsules (20 mg total) daily by mouth. 180 capsule 3   No current facility-administered medications for this visit.      Allergies:   Patient has no known allergies.   Social History:  The patient  reports that he has quit smoking. he has never used smokeless tobacco. He reports that he drinks alcohol. He reports that he does not  use drugs.   Family History:   family history includes CAD in his father.    Review of Systems: Review of Systems  Constitutional: Positive for malaise/fatigue.  Respiratory: Positive for shortness of breath.   Cardiovascular: Negative.   Gastrointestinal: Negative.   Musculoskeletal: Negative.   Neurological: Negative.   Psychiatric/Behavioral: Negative.   All other systems reviewed and are negative.    PHYSICAL EXAM: VS:  BP (!) 150/84 (BP Location: Left Arm, Patient Position: Sitting, Cuff Size: Normal)   Pulse (!) 59   Ht 5' 9.5" (1.765 m)   Wt 211 lb 4 oz (95.8 kg)   BMI 30.75 kg/m  , BMI Body mass index is 30.75 kg/m. Constitutional:  oriented to person, place, and time. No distress.  HENT:  Head: Normocephalic and atraumatic.  Eyes:  no discharge. No scleral icterus.  Neck: Normal range of motion. Neck supple. No JVD present.  Cardiovascular: Normal rate, regular rhythm, normal heart sounds and intact distal pulses. Exam reveals no gallop and no friction rub. No edema No murmur heard. Pulmonary/Chest: Effort normal and breath sounds normal. No stridor. No respiratory distress.  no wheezes.  no rales.  no tenderness.  Abdominal: Soft.  no distension.  no tenderness.  Musculoskeletal: Normal range of motion.  no  tenderness or deformity.  Neurological:  normal muscle tone. Coordination normal. No atrophy Skin: Skin is warm and dry. No rash noted. not diaphoretic.  Psychiatric:  normal mood and affect. behavior is normal. Thought content normal.       Recent Labs: 07/23/2017: BUN 19; Creatinine, Ser 1.07; Potassium 3.7; Sodium 137 07/24/2017: Hemoglobin 12.8; Platelets 197 10/13/2017: ALT 12    Lipid Panel Lab Results  Component Value Date   CHOL 113 10/13/2017   HDL 59 10/13/2017   LDLCALC 44 10/13/2017   TRIG 48 10/13/2017      Wt Readings from Last 3 Encounters:  11/01/17 211 lb 4 oz (95.8 kg)  09/24/17 208 lb 6.4 oz (94.5 kg)  08/30/17 208 lb 3.2 oz  (94.4 kg)       ASSESSMENT AND PLAN:  NSTEMI (non-ST elevated myocardial infarction) ([place]) - Plan: EKG 12-Lead Continue current medications, currently with no anginal symptoms No further testing  Essential hypertension  Long discussion concerning his blood pressure, seems to be stable at home but elevated on today's visit We will watch blood pressure for now when he will continue to record his numbers and call our office if they run high  CAD, multiple vessel Moderate to severe left circumflex disease tortuous vessel, occluded RCA  stable angina symptoms, exercising well  Mixed hyperlipidemia Cholesterol is at goal on the current lipid regimen. No changes to the medications were made.  Disposition:   F/U  12 months   Total encounter time more than 25 minutes  Greater than 50% was spent in counseling and coordination of care with the patient  Orders Placed This Encounter  Procedures  . EKG 12-Lead     Signed, Esmond Plants, M.D., Ph.D. 11/01/2017  [place], [place]

## 2017-11-01 ENCOUNTER — Ambulatory Visit: Payer: Medicare HMO | Admitting: Cardiovascular Disease

## 2017-11-01 ENCOUNTER — Encounter: Payer: Self-pay | Admitting: Cardiovascular Disease

## 2017-11-01 VITALS — BP 150/84 | HR 59 | Ht 69.5 in | Wt 211.2 lb

## 2017-11-01 DIAGNOSIS — I25118 Atherosclerotic heart disease of native coronary artery with other forms of angina pectoris: Secondary | ICD-10-CM | POA: Diagnosis not present

## 2017-11-01 DIAGNOSIS — E782 Mixed hyperlipidemia: Secondary | ICD-10-CM | POA: Diagnosis not present

## 2017-11-01 DIAGNOSIS — I10 Essential (primary) hypertension: Secondary | ICD-10-CM

## 2017-11-01 DIAGNOSIS — I214 Non-ST elevation (NSTEMI) myocardial infarction: Secondary | ICD-10-CM | POA: Diagnosis not present

## 2017-11-01 NOTE — Patient Instructions (Signed)

## 2017-11-02 ENCOUNTER — Encounter: Payer: Medicare HMO | Admitting: *Deleted

## 2017-11-02 DIAGNOSIS — I214 Non-ST elevation (NSTEMI) myocardial infarction: Secondary | ICD-10-CM | POA: Diagnosis not present

## 2017-11-02 NOTE — Progress Notes (Signed)
Daily Session Note  Patient Details  Name: George Doyle MRN: [id number] Date of Birth: Feb 13, 1951 Referring Provider:     Cardiac Rehab from 09/24/2017 in [facility]  Referring Provider  Ida Rogue MD      Encounter Date: 11/02/2017  Check In: Session Check In - 11/02/17 0843      Check-In   Location  [facility]-Cardiac [facility] Pulmonary Rehab    Staff Present  Alberteen Sam, MA, RCEP, CCRP, Exercise Physiologist;Meredith Sherryll Burger, RN Vickki Hearing, BA, ACSM CEP, Exercise Physiologist    Supervising physician immediately available to respond to emergencies  See telemetry face sheet for immediately available ER MD    Medication changes reported      No    Fall or balance concerns reported     No    Warm-up and Cool-down  Performed on first and last piece of equipment    Resistance Training Performed  Yes    VAD Patient?  No      Pain Assessment   Currently in Pain?  No/denies          Social History   Tobacco Use  Smoking Status Former Smoker  Smokeless Tobacco Never Used  Tobacco Comment   quit over 20 yrs ago    Goals Met:  Independence with exercise equipment Exercise tolerated well No report of cardiac concerns or symptoms Strength training completed today  Goals Unmet:  Not Applicable  Comments: Pt able to follow exercise prescription today without complaint.  Will continue to monitor for progression. George Doyle talked with [place] at the end of class and he would like to graduate early on Monday.  We will do walk on Monday and send home homework over the weekend.    Dr. Emily Filbert is Medical Director for [place] and [facility].

## 2017-11-02 NOTE — Patient Instructions (Signed)
Discharge Patient Instructions  Patient Details  Name: George Doyle MRN: [id number] Date of Birth: 06-Oct-1950 Referring Provider:  Minna Merritts, MD   Number of Visits: 31/36  Reason for Discharge:  Patient reached a stable level of exercise. Patient independent in their exercise. Patient has met program and personal goals.  Smoking History:  Social History   Tobacco Use  Smoking Status Former Smoker  Smokeless Tobacco Never Used  Tobacco Comment   quit over 20 yrs ago    Diagnosis:  NSTEMI (non-ST elevated myocardial infarction) ([place])  Initial Exercise Prescription: Initial Exercise Prescription - 09/24/17 1400      Date of Initial Exercise RX and Referring Provider   Date  09/24/17    Referring Provider  Ida Rogue MD      Treadmill   MPH  2.5    Grade  1    Minutes  15    METs  3.26      NuStep   Level  3    SPM  80    Minutes  15    METs  3      Arm Ergometer   Level  3    RPM  30    Minutes  15    METs  2      Prescription Details   Frequency (times per week)  3    Duration  Progress to 45 minutes of aerobic exercise without signs/symptoms of physical distress      Intensity   THRR 40-80% of Max Heartrate  97-134    Ratings of Perceived Exertion  11-13    Perceived Dyspnea  0-4      Progression   Progression  Continue to progress workloads to maintain intensity without signs/symptoms of physical distress.      Resistance Training   Training Prescription  Yes    Weight  3 lbs    Reps  10-15       Discharge Exercise Prescription (Final Exercise Prescription Changes): Exercise Prescription Changes - 10/24/17 1500      Response to Exercise   Blood Pressure (Admit)  130/74    Blood Pressure (Exercise)  148/96    Blood Pressure (Exit)  124/66    Heart Rate (Admit)  81 bpm    Heart Rate (Exercise)  110 bpm    Heart Rate (Exit)  64 bpm    Rating of Perceived Exertion (Exercise)  13    Symptoms  pain in legs when  walking    Duration  Continue with 45 min of aerobic exercise without signs/symptoms of physical distress.    Intensity  THRR unchanged      Progression   Progression  Continue to progress workloads to maintain intensity without signs/symptoms of physical distress.    Average METs  3.02      Resistance Training   Training Prescription  Yes    Weight  5 lbs    Reps  10-15      Interval Training   Interval Training  No      Treadmill   MPH  2.5    Grade  1    Minutes  15    METs  3.26      NuStep   Level  5    Minutes  15    METs  2.9      Arm Ergometer   Level  3    Minutes  15    METs  3  Home Exercise Plan   Plans to continue exercise at  Home (comment) walking    Frequency  Add 3 additional days to program exercise sessions.    Initial Home Exercises Provided  10/15/17       Functional Capacity: 6 Minute Walk    Row Name 09/24/17 1354         6 Minute Walk   Phase  Initial     Distance  1365 feet     Walk Time  6 minutes     # of Rest Breaks  0     MPH  2.56     METS  3.37     RPE  11     Perceived Dyspnea   3     VO2 Peak  11.79     Symptoms  Yes (comment)     Comments  calves (7/10), SOB, knee pain (1/10)     Resting HR  61 bpm     Resting BP  126/56     Resting Oxygen Saturation   100 %     Exercise Oxygen Saturation  during 6 min walk  100 %     Max Ex. HR  61 bpm     Max Ex. BP  126/56     2 Minute Post BP  146/72        Quality of Life: Quality of Life - 09/24/17 1046      Quality of Life Scores   Health/Function Pre  20.73 %    Socioeconomic Pre  19.86 %    Psych/Spiritual Pre  20.57 %    Family Pre  21 %    GLOBAL Pre  20.56 %       Personal Goals: Goals established at orientation with interventions provided to work toward goal. Personal Goals and Risk Factors at Admission - 09/24/17 1303      Core Components/Risk Factors/Patient Goals on Admission    Weight Management  Yes;Obesity;Weight Loss    Intervention  Weight  Management: Develop a combined nutrition and exercise program designed to reach desired caloric intake, while maintaining appropriate intake of nutrient and fiber, sodium and fats, and appropriate energy expenditure required for the weight goal.;Weight Management: Provide education and appropriate resources to help participant work on and attain dietary goals.;Weight Management/Obesity: Establish reasonable short term and long term weight goals.;Obesity: Provide education and appropriate resources to help participant work on and attain dietary goals.    Admit Weight  208 lb 6.4 oz (94.5 kg)    Goal Weight: Short Term  205 lb (93 kg)    Goal Weight: Long Term  190 lb (86.2 kg)    Expected Outcomes  Short Term: Continue to assess and modify interventions until short term weight is achieved;Long Term: Adherence to nutrition and physical activity/exercise program aimed toward attainment of established weight goal;Weight Loss: Understanding of general recommendations for a balanced deficit meal plan, which promotes 1-2 lb weight loss per week and includes a negative energy balance of [phone] kcal/d;Understanding recommendations for meals to include 15-35% energy as protein, 25-35% energy from fat, 35-60% energy from carbohydrates, less than 242m of dietary cholesterol, 20-35 gm of total fiber daily;Understanding of distribution of calorie intake throughout the day with the consumption of 4-5 meals/snacks    Improve shortness of breath with ADL's  Yes states he gets SOB a lot with minimal exertion. Is worried b/c SOB is what he had when he was having his heart attack in the middle of  the night.     Intervention  Provide education, individualized exercise plan and daily activity instruction to help decrease symptoms of SOB with activities of daily living.    Expected Outcomes  Short Term: Achieves a reduction of symptoms when performing activities of daily living.    Hypertension  Yes    Intervention  Provide  education on lifestyle modifcations including regular physical activity/exercise, weight management, moderate sodium restriction and increased consumption of fresh fruit, vegetables, and low fat dairy, alcohol moderation, and smoking cessation.;Monitor prescription use compliance.    Expected Outcomes  Short Term: Continued assessment and intervention until BP is < 140/31m HG in hypertensive participants. < 130/849mHG in hypertensive participants with diabetes, heart failure or chronic kidney disease.;Long Term: Maintenance of blood pressure at goal levels.    Lipids  Yes    Intervention  Provide education and support for participant on nutrition & aerobic/resistive exercise along with prescribed medications to achieve LDL <7061mHDL >37m81m  Expected Outcomes  Short Term: Participant states understanding of desired cholesterol values and is compliant with medications prescribed. Participant is following exercise prescription and nutrition guidelines.;Long Term: Cholesterol controlled with medications as prescribed, with individualized exercise RX and with personalized nutrition plan. Value goals: LDL < 70mg66mL > 40 mg.    Stress  Yes concerns over his health and what he is and is not allowed to do    Intervention  Offer individual and/or small group education and counseling on adjustment to heart disease, stress management and health-related lifestyle change. Teach and support self-help strategies.;Refer participants experiencing significant psychosocial distress to appropriate mental health specialists for further evaluation and treatment. When possible, include family members and significant others in education/counseling sessions.    Expected Outcomes  Short Term: Participant demonstrates changes in health-related behavior, relaxation and other stress management skills, ability to obtain effective social support, and compliance with psychotropic medications if prescribed.;Long Term: Emotional  wellbeing is indicated by absence of clinically significant psychosocial distress or social isolation.        Personal Goals Discharge: Goals and Risk Factor Review - 10/24/17 0812      Core Components/Risk Factors/Patient Goals Review   Personal Goals Review  Weight Management/Obesity;Improve shortness of breath with ADL's;Hypertension;Lipids    Review  Mitchael's weight has been creeping up.  He admits to eating too much.  He is just going to try to cut back on portion sizes and exercise more.  Breathing seems to better some days but not every day. Talked about adding in pursed lipped breathing to help with breathlessness.   Blood pressures have been good and he is checking it at home.  Medications seem to be working for him.     Expected Outcomes  Short: Work on portions and pursed lipped breathing.  Long: Continue to work on weight loss.        Exercise Goals and Review: Exercise Goals    Row Name 09/24/17 1410             Exercise Goals   Increase Physical Activity  Yes       Intervention  Provide advice, education, support and counseling about physical activity/exercise needs.;Develop an individualized exercise prescription for aerobic and resistive training based on initial evaluation findings, risk stratification, comorbidities and participant's personal goals.       Expected Outcomes  Achievement of increased cardiorespiratory fitness and enhanced flexibility, muscular endurance and strength shown through measurements of functional capacity and personal statement of participant.  Increase Strength and Stamina  Yes       Intervention  Provide advice, education, support and counseling about physical activity/exercise needs.;Develop an individualized exercise prescription for aerobic and resistive training based on initial evaluation findings, risk stratification, comorbidities and participant's personal goals.       Expected Outcomes  Achievement of increased cardiorespiratory  fitness and enhanced flexibility, muscular endurance and strength shown through measurements of functional capacity and personal statement of participant.       Able to understand and use rate of perceived exertion (RPE) scale  Yes       Intervention  Provide education and explanation on how to use RPE scale       Expected Outcomes  Short Term: Able to use RPE daily in rehab to express subjective intensity level;Long Term:  Able to use RPE to guide intensity level when exercising independently       Knowledge and understanding of Target Heart Rate Range (THRR)  Yes       Intervention  Provide education and explanation of THRR including how the numbers were predicted and where they are located for reference       Expected Outcomes  Short Term: Able to state/look up THRR;Long Term: Able to use THRR to govern intensity when exercising independently;Short Term: Able to use daily as guideline for intensity in rehab       Able to check pulse independently  Yes       Intervention  Provide education and demonstration on how to check pulse in carotid and radial arteries.;Review the importance of being able to check your own pulse for safety during independent exercise       Expected Outcomes  Short Term: Able to explain why pulse checking is important during independent exercise;Long Term: Able to check pulse independently and accurately       Understanding of Exercise Prescription  Yes       Intervention  Provide education, explanation, and written materials on patient's individual exercise prescription       Expected Outcomes  Short Term: Able to explain program exercise prescription;Long Term: Able to explain home exercise prescription to exercise independently          Nutrition & Weight - Outcomes: Pre Biometrics - 09/24/17 1414      Pre Biometrics   Height  5' 10.5" (1.791 m)    Weight  208 lb 6.4 oz (94.5 kg)    Waist Circumference  42.5 inches    Hip Circumference  39.75 inches    Waist to Hip  Ratio  1.07 %    BMI (Calculated)  29.47    Single Leg Stand  2.27 seconds        Nutrition: Nutrition Therapy & Goals - 10/03/17 0942      Nutrition Therapy   Diet  TLC    Drug/Food Interactions  Statins/Certain Fruits    Protein (specify units)  9oz    Fiber  35 grams    Saturated Fats  15 max. grams    Sodium  1500 grams      Personal Nutrition Goals   Nutrition Goal  Add more fruits to the diet    Personal Goal #2  Decrease portion sizes at meal times, particularly when eating out    Personal Goal #3  Keep consumption of fried foods to a minimum    Comments  wife cooks most meals, which are balanced and whole-food based. he will occasionally "cheat" on high fat, fried foods  but does not eat candy or snack between meals often      Intervention Plan   Intervention  Prescribe, educate and counsel regarding individualized specific dietary modifications aiming towards targeted core components such as weight, hypertension, lipid management, diabetes, heart failure and other comorbidities.    Expected Outcomes  Short Term Goal: Understand basic principles of dietary content, such as calories, fat, sodium, cholesterol and nutrients.;Short Term Goal: A plan has been developed with personal nutrition goals set during dietitian appointment.;Long Term Goal: Adherence to prescribed nutrition plan.       Nutrition Discharge: Nutrition Assessments - 09/24/17 1306      MEDFICTS Scores   Pre Score  69       Education Questionnaire Score: Knowledge Questionnaire Score - 09/24/17 1047      Knowledge Questionnaire Score   Pre Score  24/28 reviewed answers with patient, who verbalized understanding.        Goals reviewed with patient; copy given to patient.

## 2017-11-05 ENCOUNTER — Encounter: Payer: Medicare HMO | Admitting: *Deleted

## 2017-11-05 DIAGNOSIS — I214 Non-ST elevation (NSTEMI) myocardial infarction: Secondary | ICD-10-CM | POA: Diagnosis not present

## 2017-11-05 NOTE — Progress Notes (Signed)
Daily Session Note  Patient Details  Name: George Doyle MRN: [id number] Date of Birth: 1950-11-24 Referring Provider:     Cardiac Rehab from 09/24/2017 in [facility]  Referring Provider  Ida Rogue MD      Encounter Date: 11/05/2017  Check In: Session Check In - 11/05/17 0800      Check-In   Location  [facility]-Cardiac [facility] Pulmonary Rehab    Staff Present  Alberteen Sam, MA, RCEP, CCRP, Exercise Physiologist;Amantha Sklar Amedeo Plenty, BS, ACSM CEP, Exercise Physiologist;Susanne Bice, RN, BSN, CCRP    Medication changes reported      No    Fall or balance concerns reported     No    Warm-up and Cool-down  Performed on first and last piece of equipment    Resistance Training Performed  Yes    VAD Patient?  No      Pain Assessment   Currently in Pain?  No/denies    Multiple Pain Sites  No          Social History   Tobacco Use  Smoking Status Former Smoker  Smokeless Tobacco Never Used  Tobacco Comment   quit over 20 yrs ago    Goals Met:  Independence with exercise equipment Exercise tolerated well Personal goals reviewed No report of cardiac concerns or symptoms Strength training completed today  Goals Unmet:  Not Applicable  Comments:  George Doyle graduated today from  rehab with 17/24 sessions completed.  Details of the patient's exercise prescription and what He needs to do in order to continue the prescription and progress were discussed with patient.  Patient was given a copy of prescription and goals.  Patient verbalized understanding.  George Doyle plans to continue to exercise by walking at home.    Dr. Emily Filbert is Medical Director for [name] and [facility].

## 2017-11-05 NOTE — Progress Notes (Addendum)
Discharge Progress Report  Patient Details  Name: George Doyle MRN: [id number] Date of Birth: [birth year] Referring Provider:     Cardiac Rehab from 09/24/2017 in [facility]  Referring Provider  Ida Rogue MD       Number of Visits: 31/36  Reason for Discharge:  Patient reached a stable level of exercise. Patient independent in their exercise. Patient has met program and personal goals.  Smoking History:  Social History   Tobacco Use  Smoking Status Former Smoker  Smokeless Tobacco Never Used  Tobacco Comment   quit over 20 yrs ago    Diagnosis:  NSTEMI (non-ST elevated myocardial infarction) ([place])  ADL [facility]:   Initial Exercise Prescription: Initial Exercise Prescription - 09/24/17 1400      Date of Initial Exercise RX and Referring Provider   Date  09/24/17    Referring Provider  Ida Rogue MD      Treadmill   MPH  2.5    Grade  1    Minutes  15    METs  3.26      NuStep   Level  3    SPM  80    Minutes  15    METs  3      Arm Ergometer   Level  3    RPM  30    Minutes  15    METs  2      Prescription Details   Frequency (times per week)  3    Duration  Progress to 45 minutes of aerobic exercise without signs/symptoms of physical distress      Intensity   THRR 40-80% of Max Heartrate  97-134    Ratings of Perceived Exertion  11-13    Perceived Dyspnea  0-4      Progression   Progression  Continue to progress workloads to maintain intensity without signs/symptoms of physical distress.      Resistance Training   Training Prescription  Yes    Weight  3 lbs    Reps  10-15       Discharge Exercise Prescription (Final Exercise Prescription Changes): Exercise Prescription Changes - 10/24/17 1500      Response to Exercise   Blood Pressure (Admit)  130/74    Blood Pressure (Exercise)  148/96    Blood Pressure (Exit)  124/66    Heart Rate (Admit)  81 bpm    Heart Rate (Exercise)  110 bpm    Heart  Rate (Exit)  64 bpm    Rating of Perceived Exertion (Exercise)  13    Symptoms  pain in legs when walking    Duration  Continue with 45 min of aerobic exercise without signs/symptoms of physical distress.    Intensity  THRR unchanged      Progression   Progression  Continue to progress workloads to maintain intensity without signs/symptoms of physical distress.    Average METs  3.02      Resistance Training   Training Prescription  Yes    Weight  5 lbs    Reps  10-15      Interval Training   Interval Training  No      Treadmill   MPH  2.5    Grade  1    Minutes  15    METs  3.26      NuStep   Level  5    Minutes  15    METs  2.9  Arm Ergometer   Level  3    Minutes  15    METs  3      Home Exercise Plan   Plans to continue exercise at  Home (comment) walking    Frequency  Add 3 additional days to program exercise sessions.    Initial Home Exercises Provided  10/15/17       Functional Capacity: 6 Minute Walk    Row Name 09/24/17 1354         6 Minute Walk   Phase  Initial     Distance  1365 feet     Walk Time  6 minutes     # of Rest Breaks  0     MPH  2.56     METS  3.37     RPE  11     Perceived Dyspnea   3     VO2 Peak  11.79     Symptoms  Yes (comment)     Comments  calves (7/10), SOB, knee pain (1/10)     Resting HR  61 bpm     Resting BP  126/56     Resting Oxygen Saturation   100 %     Exercise Oxygen Saturation  during 6 min walk  100 %     Max Ex. HR  61 bpm     Max Ex. BP  126/56     2 Minute Post BP  146/72        Psychological, QOL, Others - Outcomes: PHQ 2/9: Depression screen [facility] 2/9 11/05/2017 09/24/2017 05/10/2017 03/03/2016  Decreased Interest 1 1 0 0  Down, Depressed, Hopeless 0 0 0 0  PHQ - 2 Score 1 1 0 0  Altered sleeping 0 0 0 -  Tired, decreased energy 1 1 0 -  Change in appetite 1 0 0 -  Feeling bad or failure about yourself  0 0 0 -  Trouble concentrating 1 0 0 -  Moving slowly or fidgety/restless 0 1 0 -   Suicidal thoughts 0 0 0 -  PHQ-9 Score 4 3 0 -  Difficult doing work/chores Somewhat difficult Somewhat difficult - -    Quality of Life: Quality of Life - 11/05/17 1305      Quality of Life Scores   Health/Function Pre  20.73 %    Health/Function Post  20.3 %    Health/Function % Change  -2.07 %    Socioeconomic Pre  19.86 %    Socioeconomic Post  20.42 %    Socioeconomic % Change   2.82 %    Psych/Spiritual Pre  20.57 %    Psych/Spiritual Post  21.93 %    Psych/Spiritual % Change  6.61 %    Family Pre  21 %    Family Post  25.2 %    Family % Change  20 %    GLOBAL Pre  20.56 %    GLOBAL Post  21.41 %    GLOBAL % Change  4.13 %       Personal Goals: Goals established at orientation with interventions provided to work toward goal. Personal Goals and Risk Factors at Admission - 09/24/17 1303      Core Components/Risk Factors/Patient Goals on Admission    Weight Management  Yes;Obesity;Weight Loss    Intervention  Weight Management: Develop a combined nutrition and exercise program designed to reach desired caloric intake, while maintaining appropriate intake of nutrient and fiber, sodium and fats, and appropriate  energy expenditure required for the weight goal.;Weight Management: Provide education and appropriate resources to help participant work on and attain dietary goals.;Weight Management/Obesity: Establish reasonable short term and long term weight goals.;Obesity: Provide education and appropriate resources to help participant work on and attain dietary goals.    Admit Weight  208 lb 6.4 oz (94.5 kg)    Goal Weight: Short Term  205 lb (93 kg)    Goal Weight: Long Term  190 lb (86.2 kg)    Expected Outcomes  Short Term: Continue to assess and modify interventions until short term weight is achieved;Long Term: Adherence to nutrition and physical activity/exercise program aimed toward attainment of established weight goal;Weight Loss: Understanding of general recommendations  for a balanced deficit meal plan, which promotes 1-2 lb weight loss per week and includes a negative energy balance of [phone] kcal/d;Understanding recommendations for meals to include 15-35% energy as protein, 25-35% energy from fat, 35-60% energy from carbohydrates, less than 280m of dietary cholesterol, 20-35 gm of total fiber daily;Understanding of distribution of calorie intake throughout the day with the consumption of 4-5 meals/snacks    Improve shortness of breath with ADL's  Yes states he gets SOB a lot with minimal exertion. Is worried b/c SOB is what he had when he was having his heart attack in the middle of the night.     Intervention  Provide education, individualized exercise plan and daily activity instruction to help decrease symptoms of SOB with activities of daily living.    Expected Outcomes  Short Term: Achieves a reduction of symptoms when performing activities of daily living.    Hypertension  Yes    Intervention  Provide education on lifestyle modifcations including regular physical activity/exercise, weight management, moderate sodium restriction and increased consumption of fresh fruit, vegetables, and low fat dairy, alcohol moderation, and smoking cessation.;Monitor prescription use compliance.    Expected Outcomes  Short Term: Continued assessment and intervention until BP is < 140/957mHG in hypertensive participants. < 130/8057mG in hypertensive participants with diabetes, heart failure or chronic kidney disease.;Long Term: Maintenance of blood pressure at goal levels.    Lipids  Yes    Intervention  Provide education and support for participant on nutrition & aerobic/resistive exercise along with prescribed medications to achieve LDL <10m68mDL >40mg47m Expected Outcomes  Short Term: Participant states understanding of desired cholesterol values and is compliant with medications prescribed. Participant is following exercise prescription and nutrition guidelines.;Long Term:  Cholesterol controlled with medications as prescribed, with individualized exercise RX and with personalized nutrition plan. Value goals: LDL < 10mg,28m > 40 mg.    Stress  Yes concerns over his health and what he is and is not allowed to do    Intervention  Offer individual and/or small group education and counseling on adjustment to heart disease, stress management and health-related lifestyle change. Teach and support self-help strategies.;Refer participants experiencing significant psychosocial distress to appropriate mental health specialists for further evaluation and treatment. When possible, include family members and significant others in education/counseling sessions.    Expected Outcomes  Short Term: Participant demonstrates changes in health-related behavior, relaxation and other stress management skills, ability to obtain effective social support, and compliance with psychotropic medications if prescribed.;Long Term: Emotional wellbeing is indicated by absence of clinically significant psychosocial distress or social isolation.        Personal Goals Discharge: Goals and Risk Factor Review    Row Name 10/24/17 [id number]  Core Components/Risk Factors/Patient Goals Review   Personal Goals Review  Weight Management/Obesity;Improve shortness of breath with ADL's;Hypertension;Lipids       Review  Colyn's weight has been creeping up.  He admits to eating too much.  He is just going to try to cut back on portion sizes and exercise more.  Breathing seems to better some days but not every day. Talked about adding in pursed lipped breathing to help with breathlessness.   Blood pressures have been good and he is checking it at home.  Medications seem to be working for him.        Expected Outcomes  Short: Work on portions and pursed lipped breathing.  Long: Continue to work on weight loss.           Exercise Goals and Review: Exercise Goals    Row Name 09/24/17 1410              Exercise Goals   Increase Physical Activity  Yes       Intervention  Provide advice, education, support and counseling about physical activity/exercise needs.;Develop an individualized exercise prescription for aerobic and resistive training based on initial evaluation findings, risk stratification, comorbidities and participant's personal goals.       Expected Outcomes  Achievement of increased cardiorespiratory fitness and enhanced flexibility, muscular endurance and strength shown through measurements of functional capacity and personal statement of participant.       Increase Strength and Stamina  Yes       Intervention  Provide advice, education, support and counseling about physical activity/exercise needs.;Develop an individualized exercise prescription for aerobic and resistive training based on initial evaluation findings, risk stratification, comorbidities and participant's personal goals.       Expected Outcomes  Achievement of increased cardiorespiratory fitness and enhanced flexibility, muscular endurance and strength shown through measurements of functional capacity and personal statement of participant.       Able to understand and use rate of perceived exertion (RPE) scale  Yes       Intervention  Provide education and explanation on how to use RPE scale       Expected Outcomes  Short Term: Able to use RPE daily in rehab to express subjective intensity level;Long Term:  Able to use RPE to guide intensity level when exercising independently       Knowledge and understanding of Target Heart Rate Range (THRR)  Yes       Intervention  Provide education and explanation of THRR including how the numbers were predicted and where they are located for reference       Expected Outcomes  Short Term: Able to state/look up THRR;Long Term: Able to use THRR to govern intensity when exercising independently;Short Term: Able to use daily as guideline for intensity in rehab       Able to check pulse  independently  Yes       Intervention  Provide education and demonstration on how to check pulse in carotid and radial arteries.;Review the importance of being able to check your own pulse for safety during independent exercise       Expected Outcomes  Short Term: Able to explain why pulse checking is important during independent exercise;Long Term: Able to check pulse independently and accurately       Understanding of Exercise Prescription  Yes       Intervention  Provide education, explanation, and written materials on patient's individual exercise prescription       Expected Outcomes  Short Term: Able to explain program exercise prescription;Long Term: Able to explain home exercise prescription to exercise independently          Nutrition & Weight - Outcomes: Pre Biometrics - 09/24/17 1414      Pre Biometrics   Height  5' 10.5" (1.791 m)    Weight  208 lb 6.4 oz (94.5 kg)    Waist Circumference  42.5 inches    Hip Circumference  39.75 inches    Waist to Hip Ratio  1.07 %    BMI (Calculated)  29.47    Single Leg Stand  2.27 seconds        Nutrition: Nutrition Therapy & Goals - 10/03/17 0942      Nutrition Therapy   Diet  TLC    Drug/Food Interactions  Statins/Certain Fruits    Protein (specify units)  9oz    Fiber  35 grams    Saturated Fats  15 max. grams    Sodium  1500 grams      Personal Nutrition Goals   Nutrition Goal  Add more fruits to the diet    Personal Goal #2  Decrease portion sizes at meal times, particularly when eating out    Personal Goal #3  Keep consumption of fried foods to a minimum    Comments  wife cooks most meals, which are balanced and whole-food based. he will occasionally "cheat" on high fat, fried foods but does not eat candy or snack between meals often      Intervention Plan   Intervention  Prescribe, educate and counsel regarding individualized specific dietary modifications aiming towards targeted core components such as weight,  hypertension, lipid management, diabetes, heart failure and other comorbidities.    Expected Outcomes  Short Term Goal: Understand basic principles of dietary content, such as calories, fat, sodium, cholesterol and nutrients.;Short Term Goal: A plan has been developed with personal nutrition goals set during dietitian appointment.;Long Term Goal: Adherence to prescribed nutrition plan.       Nutrition Discharge: Nutrition Assessments - 11/05/17 1307      MEDFICTS Scores   Pre Score  69    Post Score  47    Score Difference  -22       Education Questionnaire Score: Knowledge Questionnaire Score - 11/05/17 1305      Knowledge Questionnaire Score   Pre Score  24/28    Post Score  24/28       Goals reviewed with patient; copy given to patient.

## 2017-11-05 NOTE — Progress Notes (Signed)
Cardiac Individual Treatment Plan  Patient Details  Name: George Doyle MRN: [id number] Date of Birth: April 27, 1951 Referring Provider:     Cardiac Rehab from 09/24/2017 in [facility] Cardiac and Pulmonary Rehab  Referring Provider  Ida Rogue MD      Initial Encounter Date:    Cardiac Rehab from 09/24/2017 in [facility] Cardiac and Pulmonary Rehab  Date  09/24/17  Referring Provider  Ida Rogue MD      Visit Diagnosis: NSTEMI (non-ST elevated myocardial infarction) [facility])  Patient's Home Medications on Admission:  Current Outpatient Medications:  .  aspirin EC 81 MG EC tablet, Take 1 tablet (81 mg total) daily by mouth., Disp: 30 tablet, Rfl: 1 .  atorvastatin (LIPITOR) 80 MG tablet, Take 1 tablet (80 mg total) daily at 6 PM by mouth., Disp: 90 tablet, Rfl: 3 .  clopidogrel (PLAVIX) 75 MG tablet, Take 1 tablet (75 mg total) daily by mouth., Disp: 90 tablet, Rfl: 3 .  glucosamine-chondroitin 500-400 MG tablet, Take 1 tablet by mouth 2 (two) times daily., Disp: , Rfl:  .  metoprolol tartrate (LOPRESSOR) 25 MG tablet, Take 1 tablet (25 mg total) 2 (two) times daily by mouth., Disp: 180 tablet, Rfl: 3 .  nitroGLYCERIN (NITROSTAT) 0.4 MG SL tablet, Place 1 tablet (0.4 mg total) every 5 (five) minutes x 3 doses as needed under the tongue for chest pain., Disp: 30 tablet, Rfl: 2 .  ramipril (ALTACE) 10 MG capsule, Take 2 capsules (20 mg total) daily by mouth., Disp: 180 capsule, Rfl: 3  Past Medical History: Past Medical History:  Diagnosis Date  . Allergy   . Arthritis    knees, hands  . Hypertension   . Seizures ([place])    approx 25+ yrs ago.  Testing showed no cause. None since    Tobacco Use: Social History   Tobacco Use  Smoking Status Former Smoker  Smokeless Tobacco Never Used  Tobacco Comment   quit over 20 yrs ago    Labs: Recent Chemical engineer    Labs for ITP Cardiac and Pulmonary Rehab Latest Ref Rng & Units 07/12/2015 03/03/2016 10/12/2016 05/10/2017  10/13/2017   Cholestrol 0 - 200 mg/dL 229(H) 201(H) 223(H) 202(H) 113   LDLCALC 0 - 99 mg/dL 140(H) 120(H) 136(H) 123(H) 44   HDL >40 mg/dL 56 63 64 55 59   Trlycerides <150 mg/dL 164(H) 90 113 118 48       Exercise Target Goals:    Exercise Program Goal: Individual exercise prescription set using results from initial 6 min walk test and THRR while considering  patient's activity barriers and safety.   Exercise Prescription Goal: Initial exercise prescription builds to 30-45 minutes a day of aerobic activity, 2-3 days per week.  Home exercise guidelines will be given to patient during program as part of exercise prescription that the participant will acknowledge.  Activity Barriers & Risk Stratification: Activity Barriers & Cardiac Risk Stratification - 09/24/17 1309      Activity Barriers & Cardiac Risk Stratification   Activity Barriers  Arthritis;Back Problems;Shortness of Breath;Other (comment);Deconditioning;Joint Problems    Comments  Arthritis in both hands, he has L2/L3 ongoing issues in his back. States he has had scope surgery on bilateral knees. They sometimes give out on him for no reason and have caused him to fall in the past.     Cardiac Risk Stratification  High       6 Minute Walk: 6 Minute Walk    Row Name 09/24/17 1354  6 Minute Walk   Phase  Initial     Distance  1365 feet     Walk Time  6 minutes     # of Rest Breaks  0     MPH  2.56     METS  3.37     RPE  11     Perceived Dyspnea   3     VO2 Peak  11.79     Symptoms  Yes (comment)     Comments  calves (7/10), SOB, knee pain (1/10)     Resting HR  61 bpm     Resting BP  126/56     Resting Oxygen Saturation   100 %     Exercise Oxygen Saturation  during 6 min walk  100 %     Max Ex. HR  61 bpm     Max Ex. BP  126/56     2 Minute Post BP  146/72        Oxygen Initial Assessment:   Oxygen Re-Evaluation:   Oxygen Discharge (Final Oxygen Re-Evaluation):   Initial Exercise  Prescription: Initial Exercise Prescription - 09/24/17 1400      Date of Initial Exercise RX and Referring Provider   Date  09/24/17    Referring Provider  Ida Rogue MD      Treadmill   MPH  2.5    Grade  1    Minutes  15    METs  3.26      NuStep   Level  3    SPM  80    Minutes  15    METs  3      Arm Ergometer   Level  3    RPM  30    Minutes  15    METs  2      Prescription Details   Frequency (times per week)  3    Duration  Progress to 45 minutes of aerobic exercise without signs/symptoms of physical distress      Intensity   THRR 40-80% of Max Heartrate  97-134    Ratings of Perceived Exertion  11-13    Perceived Dyspnea  0-4      Progression   Progression  Continue to progress workloads to maintain intensity without signs/symptoms of physical distress.      Resistance Training   Training Prescription  Yes    Weight  3 lbs    Reps  10-15       Perform Capillary Blood Glucose checks as needed.  Exercise Prescription Changes:  Exercise Prescription Changes    Row Name 09/24/17 1300 09/28/17 1400 10/08/17 1600 10/15/17 0800 10/24/17 1500     Response to Exercise   Blood Pressure (Admit)  126/56  122/66  132/64  -  130/74   Blood Pressure (Exercise)  164/92  124/66  128/70  -  148/96   Blood Pressure (Exit)  146/72  126/82  114/66  -  124/66   Heart Rate (Admit)  61 bpm  75 bpm  77 bpm  -  81 bpm   Heart Rate (Exercise)  106 bpm  121 bpm  107 bpm  -  110 bpm   Heart Rate (Exit)  69 bpm  73 bpm  76 bpm  -  64 bpm   Oxygen Saturation (Admit)  100 %  -  -  -  -   Oxygen Saturation (Exercise)  100 %  -  -  -  -  Rating of Perceived Exertion (Exercise)  '11  14  13  ' -  13   Perceived Dyspnea (Exercise)  3  -  -  -  -   Symptoms  calves burning (7/10), SOB, knee pain (1/10)  none  none  -  pain in legs when walking   Comments  walk test results  second full day of exercise  -  -  -   Duration  -  Continue with 45 min of aerobic exercise without  signs/symptoms of physical distress.  Continue with 45 min of aerobic exercise without signs/symptoms of physical distress.  -  Continue with 45 min of aerobic exercise without signs/symptoms of physical distress.   Intensity  -  THRR unchanged  THRR unchanged  -  THRR unchanged     Progression   Progression  -  Continue to progress workloads to maintain intensity without signs/symptoms of physical distress.  Continue to progress workloads to maintain intensity without signs/symptoms of physical distress.  -  Continue to progress workloads to maintain intensity without signs/symptoms of physical distress.   Average METs  -  2.52  3.05  -  3.02     Resistance Training   Training Prescription  -  Yes  Yes  -  Yes   Weight  -  3 lbs  3 lbs  -  5 lbs   Reps  -  10-15  10-15  -  10-15     Interval Training   Interval Training  -  No  No  -  No     Treadmill   MPH  -  2.5  2.5  -  2.5   Grade  -  1  1  -  1   Minutes  -  15  15  -  15   METs  -  3.26  3.26  -  3.26     NuStep   Level  -  3  4  -  5   Minutes  -  15  15  -  15   METs  -  2  3  -  2.9     Arm Ergometer   Level  -  3  3  -  3   Minutes  -  15  15  -  15   METs  -  2.3  2.9  -  3     Home Exercise Plan   Plans to continue exercise at  -  -  -  Home (comment) walking  Home (comment) walking   Frequency  -  -  -  Add 3 additional days to program exercise sessions.  Add 3 additional days to program exercise sessions.   Initial Home Exercises Provided  -  -  -  10/15/17  10/15/17      Exercise Comments:  Exercise Comments    Row Name 09/26/17 0803 11/05/17 0817         Exercise Comments  First full day of exercise!  Patient was oriented to gym and equipment including functions, settings, policies, and procedures.  Patient's individual exercise prescription and treatment plan were reviewed.  All starting workloads were established based on the results of the 6 minute walk test done at initial orientation visit.  The  plan for exercise progression was also introduced and progression will be customized based on patient's performance and goals  George Doyle graduated today from  rehab with 17/24 sessions completed.  Details  of the patient's exercise prescription and what He needs to do in order to continue the prescription and progress were discussed with patient.  Patient was given a copy of prescription and goals.  Patient verbalized understanding.  George Doyle plans to continue to exercise by walking at home.         Exercise Goals and Review:  Exercise Goals    Row Name 09/24/17 1410             Exercise Goals   Increase Physical Activity  Yes       Intervention  Provide advice, education, support and counseling about physical activity/exercise needs.;Develop an individualized exercise prescription for aerobic and resistive training based on initial evaluation findings, risk stratification, comorbidities and participant's personal goals.       Expected Outcomes  Achievement of increased cardiorespiratory fitness and enhanced flexibility, muscular endurance and strength shown through measurements of functional capacity and personal statement of participant.       Increase Strength and Stamina  Yes       Intervention  Provide advice, education, support and counseling about physical activity/exercise needs.;Develop an individualized exercise prescription for aerobic and resistive training based on initial evaluation findings, risk stratification, comorbidities and participant's personal goals.       Expected Outcomes  Achievement of increased cardiorespiratory fitness and enhanced flexibility, muscular endurance and strength shown through measurements of functional capacity and personal statement of participant.       Able to understand and use rate of perceived exertion (RPE) scale  Yes       Intervention  Provide education and explanation on how to use RPE scale       Expected Outcomes  Short Term: Able to use RPE daily  in rehab to express subjective intensity level;Long Term:  Able to use RPE to guide intensity level when exercising independently       Knowledge and understanding of Target Heart Rate Range (THRR)  Yes       Intervention  Provide education and explanation of THRR including how the numbers were predicted and where they are located for reference       Expected Outcomes  Short Term: Able to state/look up THRR;Long Term: Able to use THRR to govern intensity when exercising independently;Short Term: Able to use daily as guideline for intensity in rehab       Able to check pulse independently  Yes       Intervention  Provide education and demonstration on how to check pulse in carotid and radial arteries.;Review the importance of being able to check your own pulse for safety during independent exercise       Expected Outcomes  Short Term: Able to explain why pulse checking is important during independent exercise;Long Term: Able to check pulse independently and accurately       Understanding of Exercise Prescription  Yes       Intervention  Provide education, explanation, and written materials on patient's individual exercise prescription       Expected Outcomes  Short Term: Able to explain program exercise prescription;Long Term: Able to explain home exercise prescription to exercise independently          Exercise Goals Re-Evaluation : Exercise Goals Re-Evaluation    Row Name 09/26/17 0803 09/28/17 1427 10/08/17 1605 10/15/17 0842 10/24/17 0809     Exercise Goal Re-Evaluation   Exercise Goals Review  Knowledge and understanding of Target Heart Rate Range (THRR);Able to understand and use rate of perceived exertion (  RPE) scale;Understanding of Exercise Prescription  Increase Physical Activity;Understanding of Exercise Prescription;Increase Strength and Stamina  Increase Physical Activity;Understanding of Exercise Prescription;Increase Strength and Stamina  Understanding of Exercise  Prescription;Knowledge and understanding of Target Heart Rate Range (THRR);Able to understand and use rate of perceived exertion (RPE) scale;Able to check pulse independently  Increase Physical Activity;Understanding of Exercise Prescription;Increase Strength and Stamina   Comments  Reviewed RPE scale, THR and program prescription with pt today.  Pt voiced understanding and was given a copy of goals to take home.   George Doyle is off to a good start in rehab.  He has completed his first two full days of exercise.  He has done with his beginning workloads and will start to increase his workloads next week. We will continue to monitor his progression.   George Doyle continues to do well in rehab.  He is now up to level 3 on the arm crank and level 4 on the NuStep.  We will continue to monitor his progression.   Reviewed home exercise with pt today.  Pt plans to walk at home for exercise.  Reviewed THR, pulse, RPE, sign and symptoms, and when to call 911 or MD.  Also discussed weather considerations and indoor options.  Pt voiced understanding.  George Doyle has been doing well in rehab.  He is feeling stronger.  He is walking at home until legs started to cramp.  We talked  about the PAD protocol for walking to take rest breaks.  He was able to get in 95mn.  JRyeris up to level 5 on the NuStep now.  We will continue to monitor his progression.    Expected Outcomes  Short: Use RPE daily to regulate intensity.  Long: Follow program prescription in THR.  Short: Review home exercise guideline and increase workloads.  Long: Continue to attend classes regularly.   Short: Review home exercise guideline and increase workloads.  Long: Continue to exercise routinely.   Short: Start walking on off days at home.  Long: Exercise more indepedently.  Short: Work on walking with PAD protocol.  Long: Continue to exercise independently.       Discharge Exercise Prescription (Final Exercise Prescription Changes): Exercise Prescription Changes -  10/24/17 1500      Response to Exercise   Blood Pressure (Admit)  130/74    Blood Pressure (Exercise)  148/96    Blood Pressure (Exit)  124/66    Heart Rate (Admit)  81 bpm    Heart Rate (Exercise)  110 bpm    Heart Rate (Exit)  64 bpm    Rating of Perceived Exertion (Exercise)  13    Symptoms  pain in legs when walking    Duration  Continue with 45 min of aerobic exercise without signs/symptoms of physical distress.    Intensity  THRR unchanged      Progression   Progression  Continue to progress workloads to maintain intensity without signs/symptoms of physical distress.    Average METs  3.02      Resistance Training   Training Prescription  Yes    Weight  5 lbs    Reps  10-15      Interval Training   Interval Training  No      Treadmill   MPH  2.5    Grade  1    Minutes  15    METs  3.26      NuStep   Level  5    Minutes  15  METs  2.9      Arm Ergometer   Level  3    Minutes  15    METs  3      Home Exercise Plan   Plans to continue exercise at  Home (comment) walking    Frequency  Add 3 additional days to program exercise sessions.    Initial Home Exercises Provided  10/15/17       Nutrition:  Target Goals: Understanding of nutrition guidelines, daily intake of sodium <1515m, cholesterol <2047m calories 30% from fat and 7% or less from saturated fats, daily to have 5 or more servings of fruits and vegetables.  Biometrics: Pre Biometrics - 09/24/17 1414      Pre Biometrics   Height  5' 10.5" (1.791 m)    Weight  208 lb 6.4 oz (94.5 kg)    Waist Circumference  42.5 inches    Hip Circumference  39.75 inches    Waist to Hip Ratio  1.07 %    BMI (Calculated)  29.47    Single Leg Stand  2.27 seconds        Nutrition Therapy Plan and Nutrition Goals: Nutrition Therapy & Goals - 10/03/17 0942      Nutrition Therapy   Diet  TLC    Drug/Food Interactions  Statins/Certain Fruits    Protein (specify units)  9oz    Fiber  35 grams    Saturated  Fats  15 max. grams    Sodium  1500 grams      Personal Nutrition Goals   Nutrition Goal  Add more fruits to the diet    Personal Goal #2  Decrease portion sizes at meal times, particularly when eating out    Personal Goal #3  Keep consumption of fried foods to a minimum    Comments  wife cooks most meals, which are balanced and whole-food based. he will occasionally "cheat" on high fat, fried foods but does not eat candy or snack between meals often      Intervention Plan   Intervention  Prescribe, educate and counsel regarding individualized specific dietary modifications aiming towards targeted core components such as weight, hypertension, lipid management, diabetes, heart failure and other comorbidities.    Expected Outcomes  Short Term Goal: Understand basic principles of dietary content, such as calories, fat, sodium, cholesterol and nutrients.;Short Term Goal: A plan has been developed with personal nutrition goals set during dietitian appointment.;Long Term Goal: Adherence to prescribed nutrition plan.       Nutrition Assessments: Nutrition Assessments - 11/05/17 1307      MEDFICTS Scores   Pre Score  69    Post Score  47    Score Difference  -22       Nutrition Goals Re-Evaluation: Nutrition Goals Re-Evaluation    Row Name 10/24/17 0816             Goals   Nutrition Goal  Add more fruits to the diet, watch portion sizes, fried foods.        Comment  George Doyle to liking to eat and does not want to change his eating habits.  He will try to eat more fruit and cut back on portions.  He does not want to get away from fried foods as that is one of his vices.       Expected Outcome  Short: Cut back on protions.  Long: Eat more fruit.           Nutrition Goals Discharge (  Final Nutrition Goals Re-Evaluation): Nutrition Goals Re-Evaluation - 10/24/17 0816      Goals   Nutrition Goal  Add more fruits to the diet, watch portion sizes, fried foods.     Comment  George Doyle  admits to liking to eat and does not want to change his eating habits.  He will try to eat more fruit and cut back on portions.  He does not want to get away from fried foods as that is one of his vices.    Expected Outcome  Short: Cut back on protions.  Long: Eat more fruit.        Psychosocial: Target Goals: Acknowledge presence or absence of significant depression and/or stress, maximize coping skills, provide positive support system. Participant is able to verbalize types and ability to use techniques and skills needed for reducing stress and depression.   Initial Review & Psychosocial Screening: Initial Psych Review & Screening - 09/24/17 1306      Initial Review   Current issues with  Current Stress Concerns    Source of Stress Concerns  Unable to participate in former interests or hobbies;Unable to perform yard/household activities    Comments  He would like to be able to do house and yard work without getting SOB. He also would like to feel less fear when doing things that exert more energy.       Family Dynamics   Good Support System?  Yes    Comments  wife/family      Barriers   Psychosocial barriers to participate in program  The patient should benefit from training in stress management and relaxation.      Screening Interventions   Interventions  Yes;Encouraged to exercise;Program counselor consult;Provide feedback about the scores to participant;To provide support and resources with identified psychosocial needs    Expected Outcomes  Short Term goal: Utilizing psychosocial counselor, staff and physician to assist with identification of specific Stressors or current issues interfering with healing process. Setting desired goal for each stressor or current issue identified.;Long Term Goal: Stressors or current issues are controlled or eliminated.;Short Term goal: Identification and review with participant of any Quality of Life or Depression concerns found by scoring the  questionnaire.;Long Term goal: The participant improves quality of Life and PHQ9 Scores as seen by post scores and/or verbalization of changes       Quality of Life Scores:  Quality of Life - 11/05/17 1305      Quality of Life Scores   Health/Function Pre  20.73 %    Health/Function Post  20.3 %    Health/Function % Change  -2.07 %    Socioeconomic Pre  19.86 %    Socioeconomic Post  20.42 %    Socioeconomic % Change   2.82 %    Psych/Spiritual Pre  20.57 %    Psych/Spiritual Post  21.93 %    Psych/Spiritual % Change  6.61 %    Family Pre  21 %    Family Post  25.2 %    Family % Change  20 %    GLOBAL Pre  20.56 %    GLOBAL Post  21.41 %    GLOBAL % Change  4.13 %      Scores of 19 and below usually indicate a poorer quality of life in these areas.  A difference of  2-3 points is a clinically meaningful difference.  A difference of 2-3 points in the total score of the Quality of Life Index has been  associated with significant improvement in overall quality of life, self-image, physical symptoms, and general health in studies assessing change in quality of life.  PHQ-9: Recent Review Flowsheet Data    Depression screen [facility] 2/9 11/05/2017 09/24/2017 05/10/2017 03/03/2016   Decreased Interest 1 1 0 0   Down, Depressed, Hopeless 0 0 0 0   PHQ - 2 Score 1 1 0 0   Altered sleeping 0 0 0 -   Tired, decreased energy 1 1 0 -   Change in appetite 1 0 0 -   Feeling bad or failure about yourself  0 0 0 -   Trouble concentrating 1 0 0 -   Moving slowly or fidgety/restless 0 1 0 -   Suicidal thoughts 0 0 0 -   PHQ-9 Score 4 3 0 -   Difficult doing work/chores Somewhat difficult Somewhat difficult - -     Interpretation of Total Score  Total Score Depression Severity:  1-4 = Minimal depression, 5-9 = Mild depression, 10-14 = Moderate depression, 15-19 = Moderately severe depression, 20-27 = Severe depression   Psychosocial Evaluation and Intervention: Psychosocial Evaluation - 09/26/17  0949      Psychosocial Evaluation & Interventions   Interventions  Encouraged to exercise with the program and follow exercise prescription    Comments  Counselor met with George Doyle) today for initial psychosocial evaluation.  He is a 67 year old who had a heart attack at the end of October.  He has a strong support system with a spouse of over 23 years, and a daughter and son who live close by.  Danton has some arthritis and back problems in addition to his cardiac health issues.  He sleeps well and has a good appetite.  Shaheem denies a history of depression or anxiety or any current symptoms.  He has minimal stress in his life and is typically in a positive mood.  Ketan has goals to increase his strength and stamina while in this program and he hopes to improve his breathing as well.  He will be followed by staff throughout the course of this program.      Expected Outcomes  Nylan will benefit from consistent exercise to achieve his stated goals.  The educational and psychoeducational components of this program will be helpful in understanding and coping more positively with his condition.         Psychosocial Re-Evaluation: Psychosocial Re-Evaluation    [place] Name 10/24/17 0818             Psychosocial Re-Evaluation   Current issues with  None Identified       Comments  George Doyle continues to remain postive and sleeping well.   He continues to have a strong support system.  He has been making gains on his goals of breathing better and gaining strength and stamina.  We will continue to follow with George Doyle to make sure things are staying on course.        Expected Outcomes  Short: Continue to remain positive.   Long: Continue to cope well disease.       Interventions  Encouraged to attend Cardiac Rehabilitation for the exercise;Stress management education       Continue Psychosocial Services   Follow up required by staff          Psychosocial Discharge (Final Psychosocial  Re-Evaluation): Psychosocial Re-Evaluation - 10/24/17 0818      Psychosocial Re-Evaluation   Current issues with  None Identified  Comments  George Doyle continues to remain postive and sleeping well.   He continues to have a strong support system.  He has been making gains on his goals of breathing better and gaining strength and stamina.  We will continue to follow with George Doyle to make sure things are staying on course.     Expected Outcomes  Short: Continue to remain positive.   Long: Continue to cope well disease.    Interventions  Encouraged to attend Cardiac Rehabilitation for the exercise;Stress management education    Continue Psychosocial Services   Follow up required by staff       Vocational Rehabilitation: Provide vocational rehab assistance to qualifying candidates.   Vocational Rehab Evaluation & Intervention: Vocational Rehab - 09/24/17 1311      Initial Vocational Rehab Evaluation & Intervention   Assessment shows need for Vocational Rehabilitation  No       Education: Education Goals: Education classes will be provided on a variety of topics geared toward better understanding of heart health and risk factor modification. Participant will state understanding/return demonstration of topics presented as noted by education test scores.  Learning Barriers/Preferences: Learning Barriers/Preferences - 09/24/17 1311      Learning Barriers/Preferences   Learning Barriers  None    Learning Preferences  None       Education Topics:  AED/CPR: - Group verbal and written instruction with the use of models to demonstrate the basic use of the AED with the basic ABC's of resuscitation.   Cardiac Rehab from 11/05/2017 in [facility] Cardiac and Pulmonary Rehab  Date  10/29/17  Educator  SB  Instruction Review Code  1- Verbalizes Understanding      General Nutrition Guidelines/Fats and Fiber: -Group instruction provided by verbal, written material, models and posters to present the  general guidelines for heart healthy nutrition. Gives an explanation and review of dietary fats and fiber.   Cardiac Rehab from 11/05/2017 in [facility] Cardiac and Pulmonary Rehab  Date  10/15/17  Educator  CR  Instruction Review Code  1- Verbalizes Understanding      Controlling Sodium/Reading Food Labels: -Group verbal and written material supporting the discussion of sodium use in heart healthy nutrition. Review and explanation with models, verbal and written materials for utilization of the food label.   Cardiac Rehab from 11/05/2017 in [facility] Cardiac and Pulmonary Rehab  Date  10/22/17  Educator  PI  Instruction Review Code  1- Verbalizes Understanding      Exercise Physiology & General Exercise Guidelines: - Group verbal and written instruction with models to review the exercise physiology of the cardiovascular system and associated critical values. Provides general exercise guidelines with specific guidelines to those with heart or lung disease.    Cardiac Rehab from 11/05/2017 in [facility] Cardiac and Pulmonary Rehab  Date  11/05/17  Educator  [facility]  Instruction Review Code  1- Verbalizes Understanding      Aerobic Exercise & Resistance Training: - Gives group verbal and written instruction on the various components of exercise. Focuses on aerobic and resistive training programs and the benefits of this training and how to safely progress through these programs..   Flexibility, Balance, Mind/Body Relaxation: Provides group verbal/written instruction on the benefits of flexibility and balance training, including mind/body exercise modes such as yoga, pilates and tai chi.  Demonstration and skill practice provided.   Stress and Anxiety: - Provides group verbal and written instruction about the health risks of elevated stress and causes of high stress.  Discuss the  correlation between heart/lung disease and anxiety and treatment options. Review healthy ways to manage with stress and  anxiety.   Depression: - Provides group verbal and written instruction on the correlation between heart/lung disease and depressed mood, treatment options, and the stigmas associated with seeking treatment.   Anatomy & Physiology of the Heart: - Group verbal and written instruction and models provide basic cardiac anatomy and physiology, with the coronary electrical and arterial systems. Review of Valvular disease and Heart Failure   Cardiac Rehab from 11/05/2017 in [facility] Cardiac and Pulmonary Rehab  Date  10/08/17  Educator  [facility]  Instruction Review Code  1- Verbalizes Understanding      Cardiac Procedures: - Group verbal and written instruction to review commonly prescribed medications for heart disease. Reviews the medication, class of the drug, and side effects. Includes the steps to properly store meds and maintain the prescription regimen. (beta blockers and nitrates)   Cardiac Rehab from 11/05/2017 in [facility] Cardiac and Pulmonary Rehab  Date  10/17/17  Educator  SB  Instruction Review Code  1- Verbalizes Understanding      Cardiac Medications I: - Group verbal and written instruction to review commonly prescribed medications for heart disease. Reviews the medication, class of the drug, and side effects. Includes the steps to properly store meds and maintain the prescription regimen.   Cardiac Rehab from 11/05/2017 in [facility] Cardiac and Pulmonary Rehab  Date  10/01/17  Educator  [place]  Instruction Review Code  1- Verbalizes Understanding      Cardiac Medications II: -Group verbal and written instruction to review commonly prescribed medications for heart disease. Reviews the medication, class of the drug, and side effects. (all other drug classes)    Go Sex-Intimacy & Heart Disease, Get SMART - Goal Setting: - Group verbal and written instruction through game format to discuss heart disease and the return to sexual intimacy. Provides group verbal and written material to discuss and  apply goal setting through the application of the S.M.A.R.T. Method.   Cardiac Rehab from 11/05/2017 in [facility] Cardiac and Pulmonary Rehab  Date  10/17/17  Educator  SB  Instruction Review Code  1- Verbalizes Understanding      Other Matters of the Heart: - Provides group verbal, written materials and models to describe Stable Angina and Peripheral Artery. Includes description of the disease process and treatment options available to the cardiac patient.   Exercise & Equipment Safety: - Individual verbal instruction and demonstration of equipment use and safety with use of the equipment.   Cardiac Rehab from 11/05/2017 in [facility] Cardiac and Pulmonary Rehab  Date  09/24/17  Educator  [place]  Instruction Review Code  1- Verbalizes Understanding      Infection Prevention: - Provides verbal and written material to individual with discussion of infection control including proper hand washing and proper equipment cleaning during exercise session.   Cardiac Rehab from 11/05/2017 in [facility] Cardiac and Pulmonary Rehab  Date  09/24/17  Educator  [place]  Instruction Review Code  1- Verbalizes Understanding      Falls Prevention: - Provides verbal and written material to individual with discussion of falls prevention and safety.   Cardiac Rehab from 11/05/2017 in [facility] Cardiac and Pulmonary Rehab  Date  09/24/17  Educator  [place]  Instruction Review Code  1- Verbalizes Understanding      Diabetes: - Individual verbal and written instruction to review signs/symptoms of diabetes, desired ranges of glucose level fasting, after meals and with exercise. Acknowledge  that pre and post exercise glucose checks will be done for 3 sessions at entry of program.   Know Your Numbers and Risk Factors: -Group verbal and written instruction about important numbers in your health.  Discussion of what are risk factors and how they play a role in the disease process.  Review of Cholesterol, Blood Pressure, Diabetes, and BMI  and the role they play in your overall health.   Sleep Hygiene: -Provides group verbal and written instruction about how sleep can affect your health.  Define sleep hygiene, discuss sleep cycles and impact of sleep habits. Review good sleep hygiene tips.    Cardiac Rehab from 11/05/2017 in [facility] Cardiac and Pulmonary Rehab  Date  10/24/17  Educator  [facility]  Instruction Review Code  1- Verbalizes Understanding      Other: -Provides group and verbal instruction on various topics (see comments)   Cardiac Rehab from 11/05/2017 in [facility] Cardiac and Pulmonary Rehab  Date  09/26/17  Educator  [facility]  Instruction Review Code  1- Verbalizes Understanding [Risk Factrors and Know your Numbers]      Knowledge Questionnaire Score: Knowledge Questionnaire Score - 11/05/17 1305      Knowledge Questionnaire Score   Pre Score  24/28    Post Score  24/28       Core Components/Risk Factors/Patient Goals at Admission: Personal Goals and Risk Factors at Admission - 09/24/17 1303      Core Components/Risk Factors/Patient Goals on Admission    Weight Management  Yes;Obesity;Weight Loss    Intervention  Weight Management: Develop a combined nutrition and exercise program designed to reach desired caloric intake, while maintaining appropriate intake of nutrient and fiber, sodium and fats, and appropriate energy expenditure required for the weight goal.;Weight Management: Provide education and appropriate resources to help participant work on and attain dietary goals.;Weight Management/Obesity: Establish reasonable short term and long term weight goals.;Obesity: Provide education and appropriate resources to help participant work on and attain dietary goals.    Admit Weight  208 lb 6.4 oz (94.5 kg)    Goal Weight: Short Term  205 lb (93 kg)    Goal Weight: Long Term  190 lb (86.2 kg)    Expected Outcomes  Short Term: Continue to assess and modify interventions until short term weight is achieved;Long Term:  Adherence to nutrition and physical activity/exercise program aimed toward attainment of established weight goal;Weight Loss: Understanding of general recommendations for a balanced deficit meal plan, which promotes 1-2 lb weight loss per week and includes a negative energy balance of [phone] kcal/d;Understanding recommendations for meals to include 15-35% energy as protein, 25-35% energy from fat, 35-60% energy from carbohydrates, less than 263m of dietary cholesterol, 20-35 gm of total fiber daily;Understanding of distribution of calorie intake throughout the day with the consumption of 4-5 meals/snacks    Improve shortness of breath with ADL's  Yes states he gets SOB a lot with minimal exertion. Is worried b/c SOB is what he had when he was having his heart attack in the middle of the night.     Intervention  Provide education, individualized exercise plan and daily activity instruction to help decrease symptoms of SOB with activities of daily living.    Expected Outcomes  Short Term: Achieves a reduction of symptoms when performing activities of daily living.    Hypertension  Yes    Intervention  Provide education on lifestyle modifcations including regular physical activity/exercise, weight management, moderate sodium restriction and increased consumption of fresh fruit,  vegetables, and low fat dairy, alcohol moderation, and smoking cessation.;Monitor prescription use compliance.    Expected Outcomes  Short Term: Continued assessment and intervention until BP is < 140/72m HG in hypertensive participants. < 130/81mHG in hypertensive participants with diabetes, heart failure or chronic kidney disease.;Long Term: Maintenance of blood pressure at goal levels.    Lipids  Yes    Intervention  Provide education and support for participant on nutrition & aerobic/resistive exercise along with prescribed medications to achieve LDL <7021mHDL >49m15m  Expected Outcomes  Short Term: Participant states  understanding of desired cholesterol values and is compliant with medications prescribed. Participant is following exercise prescription and nutrition guidelines.;Long Term: Cholesterol controlled with medications as prescribed, with individualized exercise RX and with personalized nutrition plan. Value goals: LDL < 70mg60mL > 40 mg.    Stress  Yes concerns over his health and what he is and is not allowed to do    Intervention  Offer individual and/or small group education and counseling on adjustment to heart disease, stress management and health-related lifestyle change. Teach and support self-help strategies.;Refer participants experiencing significant psychosocial distress to appropriate mental health specialists for further evaluation and treatment. When possible, include family members and significant others in education/counseling sessions.    Expected Outcomes  Short Term: Participant demonstrates changes in health-related behavior, relaxation and other stress management skills, ability to obtain effective social support, and compliance with psychotropic medications if prescribed.;Long Term: Emotional wellbeing is indicated by absence of clinically significant psychosocial distress or social isolation.       Core Components/Risk Factors/Patient Goals Review:  Goals and Risk Factor Review    Row Name 10/24/17 0812             Core Components/Risk Factors/Patient Goals Review   Personal Goals Review  Weight Management/Obesity;Improve shortness of breath with ADL's;Hypertension;Lipids       Review  George Doyle's weight has been creeping up.  He admits to eating too much.  He is just going to try to cut back on portion sizes and exercise more.  Breathing seems to better some days but not every day. Talked about adding in pursed lipped breathing to help with breathlessness.   Blood pressures have been good and he is checking it at home.  Medications seem to be working for him.        Expected  Outcomes  Short: Work on portions and pursed lipped breathing.  Long: Continue to work on weight loss.           Core Components/Risk Factors/Patient Goals at Discharge (Final Review):  Goals and Risk Factor Review - 10/24/17 0812      Core Components/Risk Factors/Patient Goals Review   Personal Goals Review  Weight Management/Obesity;Improve shortness of breath with ADL's;Hypertension;Lipids    Review  George Doyle's weight has been creeping up.  He admits to eating too much.  He is just going to try to cut back on portion sizes and exercise more.  Breathing seems to better some days but not every day. Talked about adding in pursed lipped breathing to help with breathlessness.   Blood pressures have been good and he is checking it at home.  Medications seem to be working for him.     Expected Outcomes  Short: Work on portions and pursed lipped breathing.  Long: Continue to work on weight loss.        ITP Comments: ITP Comments    Row Name 09/24/17 1045 10/17/17 0602 11/02/17  6811 11/05/17 0952     ITP Comments  Med Review completed. Initial ITP created. Diagnosis can be found in [facility] encounter 07/20/17  30 Day review. Continue with ITP unless directed changes per Medical Director review.   New to program  [place] talked with [place] at the end of class and he would like to graduate early on Monday.  We will do walk on Monday and send home homework over the weekend.   Discharge ITP sent and signed by Dr. [name] Heck.  Discharge Summary routed to PCP and cardiologist.       Comments: Discharge ITP

## 2017-12-11 DIAGNOSIS — R69 Illness, unspecified: Secondary | ICD-10-CM | POA: Diagnosis not present

## 2017-12-25 DIAGNOSIS — R69 Illness, unspecified: Secondary | ICD-10-CM | POA: Diagnosis not present

## 2018-02-07 ENCOUNTER — Ambulatory Visit: Payer: Medicare HMO | Admitting: Family Medicine

## 2018-02-07 ENCOUNTER — Encounter: Payer: Self-pay | Admitting: Family Medicine

## 2018-02-07 VITALS — BP 134/82 | HR 60 | Ht 69.0 in | Wt 203.0 lb

## 2018-02-07 DIAGNOSIS — J0100 Acute maxillary sinusitis, unspecified: Secondary | ICD-10-CM | POA: Diagnosis not present

## 2018-02-07 DIAGNOSIS — J40 Bronchitis, not specified as acute or chronic: Secondary | ICD-10-CM | POA: Diagnosis not present

## 2018-02-07 DIAGNOSIS — I2510 Atherosclerotic heart disease of native coronary artery without angina pectoris: Secondary | ICD-10-CM | POA: Diagnosis not present

## 2018-02-07 MED ORDER — GUAIFENESIN-CODEINE 100-10 MG/5ML PO SYRP: 5.0000 mL | ORAL_SOLUTION | Freq: Three times a day (TID) | ORAL | 0 refills | Status: DC | PRN

## 2018-02-07 MED ORDER — AMOXICILLIN 500 MG PO CAPS: 500.0000 mg | ORAL_CAPSULE | Freq: Three times a day (TID) | ORAL | 0 refills | Status: DC

## 2018-02-07 NOTE — Progress Notes (Signed)
Name: George Doyle   MRN: [id number]    DOB: [birth year]   Date:02/07/2018       Progress Note  Subjective  Chief Complaint  Chief Complaint  Patient presents with  . Sinusitis    cough and cong- mainly in chest/ can't get it up    Sinusitis  This is a new problem. The current episode started 1 to 4 weeks ago (10 days). The problem is unchanged. There has been no fever. He is experiencing no pain. Associated symptoms include congestion, coughing, diaphoresis, ear pain, headaches, a hoarse voice, sinus pressure and sneezing. Pertinent negatives include no chills, neck pain, shortness of breath, sore throat or swollen glands. Past treatments include oral decongestants. The treatment provided no relief.  Cough  This is a new problem. The current episode started 1 to 4 weeks ago (10 days). The problem has been unchanged. The cough is non-productive ("not breaking up"). Associated symptoms include ear congestion, ear pain and headaches. Pertinent negatives include no chest pain, chills, fever, heartburn, myalgias, rash, sore throat, shortness of breath, weight loss or wheezing. Associated symptoms comments: Yellow nasal drainage. The symptoms are aggravated by pollens (Mowing). He has tried OTC cough suppressant for the symptoms. The treatment provided no relief. There is no history of environmental allergies. heart disease    CAD, multiple vessel Discussed sxs associated with manual work   Past Medical History:  Diagnosis Date  . Allergy   . Arthritis    knees, hands  . Hypertension   . Seizures ([place])    approx 25+ yrs ago.  Testing showed no cause. None since    Past Surgical History:  Procedure Laterality Date  . COLONOSCOPY  2011   cleared for 10 yrs  . COLONOSCOPY WITH PROPOFOL N/A 11/06/2016   Procedure: COLONOSCOPY WITH PROPOFOL;  Surgeon: [name], MD;  Location: [place];  Service: Endoscopy;  Laterality: N/A;  . CORONARY STENT INTERVENTION N/A  07/23/2017   Procedure: CORONARY STENT INTERVENTION;  Surgeon: Yolonda Kida, MD;  Location: [place] [facility];  Service: Cardiovascular;  Laterality: N/A;  . HERNIA REPAIR    . KNEE SURGERY Bilateral    2 on R) 1 on L)  . LEFT HEART CATH AND CORONARY ANGIOGRAPHY N/A 07/23/2017   Procedure: LEFT HEART CATH AND CORONARY ANGIOGRAPHY;  Surgeon: Minna Merritts, MD;  Location: [place] [facility];  Service: Cardiovascular;  Laterality: N/A;  . POLYPECTOMY  11/06/2016   Procedure: POLYPECTOMY;  Surgeon: [name], MD;  Location: [place];  Service: Endoscopy;;    Family History  Problem Relation Age of Onset  . CAD Father     Social History   Socioeconomic History  . Marital status: Married    Spouse name: Not on file  . Number of children: Not on file  . Years of education: Not on file  . Highest education level: Not on file  Occupational History  . Not on file  Social Needs  . Financial resource strain: Not on file  . Food insecurity:    Worry: Not on file    Inability: Not on file  . Transportation needs:    Medical: Not on file    Non-medical: Not on file  Tobacco Use  . Smoking status: Former Research scientist (life sciences)  . Smokeless tobacco: Never Used  . Tobacco comment: quit over 20 yrs ago  Substance and Sexual Activity  . Alcohol use: Yes    Alcohol/week: 0.0 oz    Comment:  1-2 drinks/year  . Drug use: No  . Sexual activity: Not Currently  Lifestyle  . Physical activity:    Days per week: Not on file    Minutes per session: Not on file  . Stress: Not on file  Relationships  . Social connections:    Talks on phone: Not on file    Gets together: Not on file    Attends religious service: Not on file    Active member of club or organization: Not on file    Attends meetings of clubs or organizations: Not on file    Relationship status: Not on file  . Intimate partner violence:    Fear of current or ex partner: Not on file    Emotionally abused: Not on file     Physically abused: Not on file    Forced sexual activity: Not on file  Other Topics Concern  . Not on file  Social History Narrative  . Not on file    No Known Allergies  Outpatient Medications Prior to Visit  Medication Sig Dispense Refill  . aspirin EC 81 MG EC tablet Take 1 tablet (81 mg total) daily by mouth. 30 tablet 1  . atorvastatin (LIPITOR) 80 MG tablet Take 1 tablet (80 mg total) daily at 6 PM by mouth. 90 tablet 3  . clopidogrel (PLAVIX) 75 MG tablet Take 1 tablet (75 mg total) daily by mouth. 90 tablet 3  . metoprolol tartrate (LOPRESSOR) 25 MG tablet Take 1 tablet (25 mg total) 2 (two) times daily by mouth. 180 tablet 3  . nitroGLYCERIN (NITROSTAT) 0.4 MG SL tablet Place 1 tablet (0.4 mg total) every 5 (five) minutes x 3 doses as needed under the tongue for chest pain. 30 tablet 2  . ramipril (ALTACE) 10 MG capsule Take 2 capsules (20 mg total) daily by mouth. 180 capsule 3  . glucosamine-chondroitin 500-400 MG tablet Take 1 tablet by mouth 2 (two) times daily.     No facility-administered medications prior to visit.     Review of Systems  Constitutional: Positive for diaphoresis. Negative for chills, fever, malaise/fatigue and weight loss.  HENT: Positive for congestion, ear pain, hoarse voice, sinus pressure and sneezing. Negative for ear discharge and sore throat.   Eyes: Negative for blurred vision.  Respiratory: Positive for cough. Negative for sputum production, shortness of breath and wheezing.   Cardiovascular: Negative for chest pain, palpitations and leg swelling.  Gastrointestinal: Negative for abdominal pain, blood in stool, constipation, diarrhea, heartburn, melena and nausea.  Genitourinary: Negative for dysuria, frequency, hematuria and urgency.  Musculoskeletal: Negative for back pain, joint pain, myalgias and neck pain.  Skin: Negative for rash.  Neurological: Positive for headaches. Negative for dizziness, tingling, sensory change and focal weakness.   Endo/Heme/Allergies: Negative for environmental allergies and polydipsia. Does not bruise/bleed easily.  Psychiatric/Behavioral: Negative for depression and suicidal ideas. The patient is not nervous/anxious and does not have insomnia.      Objective  Vitals:   02/07/18 0805  BP: 134/82  Pulse: 60  Weight: 203 lb (92.1 kg)  Height: 5\' 9"  (1.753 m)    Physical Exam  Constitutional: He is oriented to person, place, and time. He appears well-developed.  HENT:  Head: Normocephalic.  Right Ear: Hearing, tympanic membrane, external ear and ear canal normal. No decreased hearing is noted.  Left Ear: Hearing, tympanic membrane, external ear and ear canal normal.  Nose: Mucosal edema and rhinorrhea present. Right sinus exhibits no maxillary sinus tenderness and  no frontal sinus tenderness. Left sinus exhibits no maxillary sinus tenderness and no frontal sinus tenderness.  Mouth/Throat: Uvula is midline, oropharynx is clear and moist and mucous membranes are normal.  Eyes: Pupils are equal, round, and reactive to light. Conjunctivae, EOM and lids are normal. Right eye exhibits no discharge. Left eye exhibits no discharge. No scleral icterus.  Neck: Normal range of motion. Neck supple. No JVD present. No tracheal deviation present. No thyromegaly present.  Cardiovascular: Normal rate, regular rhythm, normal heart sounds and intact distal pulses. Exam reveals no gallop and no friction rub.  No murmur heard. Pulmonary/Chest: Breath sounds normal. No respiratory distress. He has no wheezes. He has no rales.  Abdominal: Soft. Bowel sounds are normal. He exhibits no mass. There is no hepatosplenomegaly. There is no tenderness. There is no rebound, no guarding and no CVA tenderness.  Musculoskeletal: Normal range of motion. He exhibits no edema or tenderness.  Lymphadenopathy:    He has no cervical adenopathy.  Neurological: He is alert and oriented to person, place, and time. He has normal strength  and normal reflexes. No cranial nerve deficit.  Skin: Skin is warm. No rash noted.  Nursing note and vitals reviewed.     Assessment & Plan  Problem List Items Addressed This Visit      Cardiovascular and Mediastinum   CAD, multiple vessel    Discussed sxs associated with manual work       Other Visit Diagnoses    Acute maxillary sinusitis, recurrence not specified    -  Primary   patient has 10 days yellow sinus drainage   Relevant Medications   amoxicillin (AMOXIL) 500 MG capsule   guaiFENesin-codeine (ROBITUSSIN AC) 100-10 MG/5ML syrup   Bronchitis       Patient needs cough especially at night   Relevant Medications   guaiFENesin-codeine (ROBITUSSIN AC) 100-10 MG/5ML syrup      Meds ordered this encounter  Medications  . amoxicillin (AMOXIL) 500 MG capsule    Sig: Take 1 capsule (500 mg total) by mouth 3 (three) times daily.    Dispense:  30 capsule    Refill:  0  . guaiFENesin-codeine (ROBITUSSIN AC) 100-10 MG/5ML syrup    Sig: Take 5 mLs by mouth 3 (three) times daily as needed for cough.    Dispense:  150 mL    Refill:  0      Dr. Otilio Miu [facility]  02/07/18

## 2018-02-07 NOTE — Assessment & Plan Note (Signed)
Discussed sxs associated with manual work

## 2018-03-27 ENCOUNTER — Encounter: Payer: Self-pay | Admitting: Intensive Care

## 2018-03-27 ENCOUNTER — Emergency Department: Payer: Medicare HMO

## 2018-03-27 ENCOUNTER — Other Ambulatory Visit: Payer: Self-pay

## 2018-03-27 ENCOUNTER — Emergency Department
Admission: EM | Admit: 2018-03-27 | Discharge: 2018-03-27 | Disposition: A | Discharge status: Other Acute Inpatient Hospital | Payer: Medicare HMO | Attending: Emergency Medicine | Admitting: Emergency Medicine

## 2018-03-27 DIAGNOSIS — R55 Syncope and collapse: Secondary | ICD-10-CM | POA: Diagnosis not present

## 2018-03-27 DIAGNOSIS — S065XAA Traumatic subdural hemorrhage with loss of consciousness status unknown, initial encounter: Secondary | ICD-10-CM | POA: Insufficient documentation

## 2018-03-27 DIAGNOSIS — Z7982 Long term (current) use of aspirin: Secondary | ICD-10-CM | POA: Diagnosis not present

## 2018-03-27 DIAGNOSIS — Z87891 Personal history of nicotine dependence: Secondary | ICD-10-CM | POA: Diagnosis not present

## 2018-03-27 DIAGNOSIS — R51 Headache: Secondary | ICD-10-CM | POA: Diagnosis not present

## 2018-03-27 DIAGNOSIS — G8911 Acute pain due to trauma: Secondary | ICD-10-CM | POA: Diagnosis not present

## 2018-03-27 DIAGNOSIS — W19XXXA Unspecified fall, initial encounter: Secondary | ICD-10-CM | POA: Diagnosis not present

## 2018-03-27 DIAGNOSIS — I2582 Chronic total occlusion of coronary artery: Secondary | ICD-10-CM | POA: Diagnosis not present

## 2018-03-27 DIAGNOSIS — D72829 Elevated white blood cell count, unspecified: Secondary | ICD-10-CM | POA: Diagnosis not present

## 2018-03-27 DIAGNOSIS — Y93H2 Activity, gardening and landscaping: Secondary | ICD-10-CM | POA: Diagnosis not present

## 2018-03-27 DIAGNOSIS — R748 Abnormal levels of other serum enzymes: Secondary | ICD-10-CM | POA: Diagnosis not present

## 2018-03-27 DIAGNOSIS — I6200 Nontraumatic subdural hemorrhage, unspecified: Secondary | ICD-10-CM | POA: Diagnosis not present

## 2018-03-27 DIAGNOSIS — Z87820 Personal history of traumatic brain injury: Secondary | ICD-10-CM | POA: Diagnosis not present

## 2018-03-27 DIAGNOSIS — S4991XA Unspecified injury of right shoulder and upper arm, initial encounter: Secondary | ICD-10-CM | POA: Diagnosis not present

## 2018-03-27 DIAGNOSIS — S065X9A Traumatic subdural hemorrhage with loss of consciousness of unspecified duration, initial encounter: Secondary | ICD-10-CM | POA: Diagnosis not present

## 2018-03-27 DIAGNOSIS — Z79899 Other long term (current) drug therapy: Secondary | ICD-10-CM | POA: Diagnosis not present

## 2018-03-27 DIAGNOSIS — Y92007 Garden or yard of unspecified non-institutional (private) residence as the place of occurrence of the external cause: Secondary | ICD-10-CM | POA: Insufficient documentation

## 2018-03-27 DIAGNOSIS — D649 Anemia, unspecified: Secondary | ICD-10-CM | POA: Diagnosis not present

## 2018-03-27 DIAGNOSIS — W010XXA Fall on same level from slipping, tripping and stumbling without subsequent striking against object, initial encounter: Secondary | ICD-10-CM | POA: Diagnosis not present

## 2018-03-27 DIAGNOSIS — I10 Essential (primary) hypertension: Secondary | ICD-10-CM | POA: Diagnosis not present

## 2018-03-27 DIAGNOSIS — S199XXA Unspecified injury of neck, initial encounter: Secondary | ICD-10-CM | POA: Diagnosis not present

## 2018-03-27 DIAGNOSIS — Y998 Other external cause status: Secondary | ICD-10-CM | POA: Diagnosis not present

## 2018-03-27 DIAGNOSIS — I2510 Atherosclerotic heart disease of native coronary artery without angina pectoris: Secondary | ICD-10-CM | POA: Diagnosis not present

## 2018-03-27 DIAGNOSIS — Z7902 Long term (current) use of antithrombotics/antiplatelets: Secondary | ICD-10-CM | POA: Insufficient documentation

## 2018-03-27 DIAGNOSIS — R9431 Abnormal electrocardiogram [ECG] [EKG]: Secondary | ICD-10-CM | POA: Diagnosis not present

## 2018-03-27 DIAGNOSIS — G40909 Epilepsy, unspecified, not intractable, without status epilepticus: Secondary | ICD-10-CM | POA: Diagnosis not present

## 2018-03-27 DIAGNOSIS — S065X0A Traumatic subdural hemorrhage without loss of consciousness, initial encounter: Secondary | ICD-10-CM | POA: Diagnosis not present

## 2018-03-27 DIAGNOSIS — Z66 Do not resuscitate: Secondary | ICD-10-CM | POA: Diagnosis not present

## 2018-03-27 DIAGNOSIS — Z87898 Personal history of other specified conditions: Secondary | ICD-10-CM | POA: Diagnosis not present

## 2018-03-27 DIAGNOSIS — S0990XA Unspecified injury of head, initial encounter: Secondary | ICD-10-CM | POA: Diagnosis present

## 2018-03-27 DIAGNOSIS — R569 Unspecified convulsions: Secondary | ICD-10-CM | POA: Diagnosis not present

## 2018-03-27 DIAGNOSIS — I252 Old myocardial infarction: Secondary | ICD-10-CM | POA: Diagnosis not present

## 2018-03-27 HISTORY — DX: Traumatic subdural hemorrhage with loss of consciousness status unknown, initial encounter: S06.5XAA

## 2018-03-27 LAB — COMPREHENSIVE METABOLIC PANEL
ALK PHOS: 75 U/L (ref 38–126)
ALT: 15 U/L (ref 0–44)
AST: 36 U/L (ref 15–41)
Albumin: 4.1 g/dL (ref 3.5–5.0)
Anion gap: 12 (ref 5–15)
BILIRUBIN TOTAL: 1.2 mg/dL (ref 0.3–1.2)
BUN: 23 mg/dL (ref 8–23)
CO2: 19 mmol/L — ABNORMAL LOW (ref 22–32)
CREATININE: 1 mg/dL (ref 0.61–1.24)
Calcium: 8.8 mg/dL — ABNORMAL LOW (ref 8.9–10.3)
Chloride: 107 mmol/L (ref 98–111)
GFR calc Af Amer: >60 mL/min (ref >60)
Glucose, Bld: 89 mg/dL (ref 70–99)
Potassium: 3.9 mmol/L (ref 3.5–5.1)
Sodium: 138 mmol/L (ref 135–145)
TOTAL PROTEIN: 7.1 g/dL (ref 6.5–8.1)

## 2018-03-27 LAB — CBC WITH DIFFERENTIAL/PLATELET
BASOS ABS: 0 10*3/uL (ref 0–0.1)
Basophils Relative: 0 %
Eosinophils Absolute: 0.1 10*3/uL (ref 0–0.7)
Eosinophils Relative: 1 %
HEMATOCRIT: 43.2 % (ref 40.0–52.0)
HEMOGLOBIN: 14.6 g/dL (ref 13.0–18.0)
LYMPHS PCT: 23 %
Lymphs Abs: 2.3 10*3/uL (ref 1.0–3.6)
MCH: 30.9 pg (ref 26.0–34.0)
MCHC: 33.9 g/dL (ref 32.0–36.0)
MCV: 91.2 fL (ref 80.0–100.0)
MONO ABS: 0.7 10*3/uL (ref 0.2–1.0)
Monocytes Relative: 7 %
NEUTROS ABS: 7 10*3/uL — AB (ref 1.4–6.5)
Neutrophils Relative %: 69 %
Platelets: 194 10*3/uL (ref 150–440)
RBC: 4.74 MIL/uL (ref 4.40–5.90)
RDW: 14.5 % (ref 11.5–14.5)
WBC: 10.1 10*3/uL (ref 3.8–10.6)

## 2018-03-27 LAB — TROPONIN I: TROPONIN I: 0.03 ng/mL — AB (ref <0.03)

## 2018-03-27 MED ORDER — SODIUM CHLORIDE 0.9 % IV SOLN: 20.0000 ug | Freq: Once | INTRAVENOUS | Status: DC

## 2018-03-27 MED ORDER — LEVETIRACETAM IN NACL 1000 MG/100ML IV SOLN
1000.0000 mg | Freq: Once | INTRAVENOUS | Status: AC
Start: 2018-03-27 — End: 2018-03-27
  Administered 2018-03-27: 1000 mg via INTRAVENOUS
  Filled 2018-03-27: qty 100

## 2018-03-27 MED ORDER — MORPHINE SULFATE (PF) 4 MG/ML IV SOLN
4.0000 mg | Freq: Once | INTRAVENOUS | Status: AC
  Administered 2018-03-27: 4 mg via INTRAVENOUS
  Filled 2018-03-27: qty 1

## 2018-03-27 MED ORDER — SODIUM CHLORIDE 0.9 % IV SOLN
0.4000 ug/kg | Freq: Once | INTRAVENOUS | Status: AC
  Administered 2018-03-27: 37.2 ug via INTRAVENOUS
  Filled 2018-03-27: qty 9.3

## 2018-03-27 NOTE — ED Triage Notes (Addendum)
Patient was outside picking blueberries this morning when he started to seize. Wife states the seizure lasted about 2 minutes. Post-ictal when EMS arrived. Patient has trauma to tongue. Last seizure X10 years ago. EMS vitals 94RA, HR 80, Blood sugar 95, 97.5 temp, 146/79b/p

## 2018-03-27 NOTE — ED Notes (Signed)
EMTALA REVIEWED 

## 2018-03-27 NOTE — ED Provider Notes (Signed)
[facility] Emergency Department Provider Note   ____________________________________________   I have reviewed the triage vital signs and the nursing notes.   HISTORY  Chief Complaint Seizures   History limited by: Not Limited   HPI George Doyle is a 67 y.o. male who presents to the emergency department today because of concerns for seizure.  The patient was outside and had just finished picking blueberries with a family member.  States he had felt normal earlier in the day.  Family member noticed the patient then collapsed.  Patient started developing a full body tonic-clonic seizure-like activity.  Family states it lasted maybe 3 to 5 minutes.  At this time patient is complaining primarily right shoulder playing.  Did also cut his tongue.  Patient did have a seizure back in 2005 however work-up at that time was negative.  Additionally patient had a few seizures in his 73s.   Per medical record review patient has a history of seizures.  Past Medical History:  Diagnosis Date  . Allergy   . Arthritis    knees, hands  . Hypertension   . Seizures ([place])    approx 25+ yrs ago.  Testing showed no cause. None since    Patient Active Problem List   Diagnosis Date Noted  . Old MI (myocardial infarction) 08/30/2017  . CAD, multiple vessel 08/01/2017  . NSTEMI (non-ST elevated myocardial infarction) ([place]) 07/20/2017  . Hyperlipidemia 05/10/2017  . Special screening for malignant neoplasms, colon   . Benign neoplasm of ascending colon   . Polyp of sigmoid colon   . Essential hypertension 03/03/2016    Past Surgical History:  Procedure Laterality Date  . COLONOSCOPY  2011   cleared for 10 yrs  . COLONOSCOPY WITH PROPOFOL N/A 11/06/2016   Procedure: COLONOSCOPY WITH PROPOFOL;  Surgeon: [name], MD;  Location: [place];  Service: Endoscopy;  Laterality: N/A;  . CORONARY STENT INTERVENTION N/A 07/23/2017   Procedure: CORONARY STENT  INTERVENTION;  Surgeon: Yolonda Kida, MD;  Location: [place] [facility];  Service: Cardiovascular;  Laterality: N/A;  . HERNIA REPAIR    . KNEE SURGERY Bilateral    2 on R) 1 on L)  . LEFT HEART CATH AND CORONARY ANGIOGRAPHY N/A 07/23/2017   Procedure: LEFT HEART CATH AND CORONARY ANGIOGRAPHY;  Surgeon: Minna Merritts, MD;  Location: [place] [facility];  Service: Cardiovascular;  Laterality: N/A;  . POLYPECTOMY  11/06/2016   Procedure: POLYPECTOMY;  Surgeon: [name], MD;  Location: [place];  Service: Endoscopy;;    Prior to Admission medications   Medication Sig Start Date End Date Taking? Authorizing Provider  amoxicillin (AMOXIL) 500 MG capsule Take 1 capsule (500 mg total) by mouth 3 (three) times daily. 02/07/18   Juline Patch, MD  aspirin EC 81 MG EC tablet Take 1 tablet (81 mg total) daily by mouth. 07/24/17   Gladstone Lighter, MD  atorvastatin (LIPITOR) 80 MG tablet Take 1 tablet (80 mg total) daily at 6 PM by mouth. 08/01/17   Gollan, Kathlene November, MD  clopidogrel (PLAVIX) 75 MG tablet Take 1 tablet (75 mg total) daily by mouth. 08/01/17   Gollan, Kathlene November, MD  guaiFENesin-codeine (ROBITUSSIN AC) 100-10 MG/5ML syrup Take 5 mLs by mouth 3 (three) times daily as needed for cough. 02/07/18   Juline Patch, MD  metoprolol tartrate (LOPRESSOR) 25 MG tablet Take 1 tablet (25 mg total) 2 (two) times daily by mouth. 08/01/17   Gollan,  Kathlene November, MD  nitroGLYCERIN (NITROSTAT) 0.4 MG SL tablet Place 1 tablet (0.4 mg total) every 5 (five) minutes x 3 doses as needed under the tongue for chest pain. 07/24/17   Gladstone Lighter, MD  ramipril (ALTACE) 10 MG capsule Take 2 capsules (20 mg total) daily by mouth. 08/01/17   Gollan, Kathlene November, MD    Allergies Patient has no known allergies.  Family History  Problem Relation Age of Onset  . CAD Father     Social History Social History   Tobacco Use  . Smoking status: Former Research scientist (life sciences)  . Smokeless tobacco: Never Used   . Tobacco comment: quit over 20 yrs ago  Substance Use Topics  . Alcohol use: Yes    Alcohol/week: 0.0 oz    Comment: 1-2 drinks/year  . Drug use: No    Review of Systems Constitutional: No fever/chills Eyes: No visual changes. ENT: No sore throat. Tongue lacerations. Cardiovascular: Denies chest pain. Respiratory: Denies shortness of breath. Gastrointestinal: No abdominal pain.  No nausea, no vomiting.  No diarrhea.   Genitourinary: Negative for dysuria. Musculoskeletal: Positive for right shoulder pain. Skin: Negative for rash. Neurological: Positive for seizures.  ____________________________________________   PHYSICAL EXAM:  VITAL SIGNS: ED Triage Vitals  Enc Vitals Group     BP 03/27/18 1034 129/75     Pulse Rate 03/27/18 1034 76     Resp 03/27/18 1034 20     Temp 03/27/18 1034 97.6 F (36.4 C)     Temp Source 03/27/18 1034 Oral     SpO2 03/27/18 1034 92 %     Weight 03/27/18 1041 205 lb (93 kg)     Height 03/27/18 1041 5\' 10"  (1.778 m)     Head Circumference --      Peak Flow --      Pain Score 03/27/18 1041 8   Constitutional: Alert and oriented.  Eyes: Conjunctivae are normal.  ENT      Head: Normocephalic. Small bruise to right side of scalp.      Nose: No congestion/rhinnorhea.      Mouth/Throat: Mucous membranes are moist. Maceration of tongue bilaterally      Neck: No stridor. Hematological/Lymphatic/Immunilogical: No cervical lymphadenopathy. Cardiovascular: Normal rate, regular rhythm.  No murmurs, rubs, or gallops.  Respiratory: Normal respiratory effort without tachypnea nor retractions. Breath sounds are clear and equal bilaterally. No wheezes/rales/rhonchi. Gastrointestinal: Soft and non tender. No rebound. No guarding.  Genitourinary: Deferred Musculoskeletal: Right shoulder tenderness. Neurologic:  Normal speech and language. No gross focal neurologic deficits are appreciated.  Skin:  Petechia noted around eyes. Psychiatric: Mood and affect  are normal. Speech and behavior are normal. Patient exhibits appropriate insight and judgment.  ____________________________________________    LABS (pertinent positives/negatives)  Trop 0.03 CBC wbc 10.1, hgb 14.6, plt 194 CMP wnl except co2 19, ca 8.8  ____________________________________________   EKG  I, Nance Pear, attending physician, personally viewed and interpreted this EKG  EKG Time: 1040 Rate: 71 Rhythm: sinus rhythm Axis: normal Intervals: qtc 406 QRS: q waves II, III, aVF, V4-V6 ST changes: no st elevation Impression: abnormal ekg ____________________________________________    RADIOLOGY  CT head Subdural hematoma  I, Irlanda Croghan, personally discussed these images and results by phone with the on-call radiologist and used this discussion as part of my medical decision making.  ____________________________________________   PROCEDURES  Procedures  CRITICAL CARE Performed by: Nance Pear   Total critical care time:40 minutes  Critical care time was exclusive of separately billable  procedures and treating other patients.  Critical care was necessary to treat or prevent imminent or life-threatening deterioration.  Critical care was time spent personally by me on the following activities: development of treatment plan with patient and/or surrogate as well as nursing, discussions with consultants, evaluation of patient's response to treatment, examination of patient, obtaining history from patient or surrogate, ordering and performing treatments and interventions, ordering and review of laboratory studies, ordering and review of radiographic studies, pulse oximetry and re-evaluation of patient's condition.  ____________________________________________   INITIAL IMPRESSION / ASSESSMENT AND PLAN / ED COURSE  Pertinent labs & imaging results that were available during my care of the patient were reviewed by me and considered in my medical  decision making (see chart for details).   Patient presented after collapsing and having seizure like activity. On exam patient has evidence of recent seizure including tongue lacerations, periorbital petechia. Also with small bruise to right side of scalp. Possible etiologies of collapse and seizure would include intracranial pathology, electrolyte abnormality, cardiac, infection amongst other etiologies. Will get blood work, head CT.  Head CT shows subdural hematoma. Patient was given keppra IV. Discussed findings with patient and family. Will plan on transfer to [facility]. Patient will be ordered ddavp given history of plavix and aspirin. Do not have platelets at our facility.   ____________________________________________   FINAL CLINICAL IMPRESSION(S) / ED DIAGNOSES  Final diagnoses:  Seizure ([place])  Subdural hematoma ([place])     Note: This dictation was prepared with Dragon dictation. Any transcriptional errors that result from this process are unintentional     Nance Pear, MD 03/28/18 1523

## 2018-03-27 NOTE — ED Notes (Signed)
Patient transported to CT and DG 

## 2018-03-27 NOTE — ED Notes (Signed)
[facility]  CALLED  PER  DR  GOODMAN MD 

## 2018-03-28 DIAGNOSIS — Z87898 Personal history of other specified conditions: Secondary | ICD-10-CM | POA: Insufficient documentation

## 2018-03-28 DIAGNOSIS — S065X9A Traumatic subdural hemorrhage with loss of consciousness of unspecified duration, initial encounter: Secondary | ICD-10-CM | POA: Diagnosis not present

## 2018-03-28 DIAGNOSIS — R55 Syncope and collapse: Secondary | ICD-10-CM | POA: Diagnosis not present

## 2018-03-28 DIAGNOSIS — R569 Unspecified convulsions: Secondary | ICD-10-CM | POA: Diagnosis not present

## 2018-03-28 DIAGNOSIS — I10 Essential (primary) hypertension: Secondary | ICD-10-CM | POA: Diagnosis not present

## 2018-03-28 DIAGNOSIS — I2510 Atherosclerotic heart disease of native coronary artery without angina pectoris: Secondary | ICD-10-CM | POA: Diagnosis not present

## 2018-03-28 DIAGNOSIS — S065X0A Traumatic subdural hemorrhage without loss of consciousness, initial encounter: Secondary | ICD-10-CM | POA: Diagnosis not present

## 2018-03-28 MED ORDER — GENERIC EXTERNAL MEDICATION
1000.00 | Status: DC
Start: 2018-03-28 — End: 2018-03-28

## 2018-03-28 MED ORDER — METOPROLOL TARTRATE 25 MG PO TABS
25.00 | ORAL_TABLET | ORAL | Status: DC
Start: 2018-03-28 — End: 2018-03-28

## 2018-03-28 MED ORDER — LIDOCAINE HCL 1 % IJ SOLN
0.50 | INTRAMUSCULAR | Status: DC
Start: ? — End: 2018-03-28

## 2018-03-28 MED ORDER — ACETAMINOPHEN 325 MG PO TABS
975.00 | ORAL_TABLET | ORAL | Status: DC
Start: 2018-03-28 — End: 2018-03-28

## 2018-03-28 MED ORDER — ATORVASTATIN CALCIUM 40 MG PO TABS
80.00 | ORAL_TABLET | ORAL | Status: DC
Start: 2018-03-29 — End: 2018-03-28

## 2018-03-28 MED ORDER — OXYCODONE HCL 5 MG PO TABS
5.00 | ORAL_TABLET | ORAL | Status: DC
Start: ? — End: 2018-03-28

## 2018-03-28 MED ORDER — POLYETHYLENE GLYCOL 3350 17 G PO PACK
17.00 | PACK | ORAL | Status: DC
Start: ? — End: 2018-03-28

## 2018-03-28 MED ORDER — LIDOCAINE 5 % EX PTCH
1.00 | MEDICATED_PATCH | CUTANEOUS | Status: DC
Start: 2018-03-28 — End: 2018-03-28

## 2018-03-28 MED ORDER — SENNOSIDES-DOCUSATE SODIUM 8.6-50 MG PO TABS
2.00 | ORAL_TABLET | ORAL | Status: DC
Start: 2018-03-28 — End: 2018-03-28

## 2018-03-29 DIAGNOSIS — R569 Unspecified convulsions: Secondary | ICD-10-CM | POA: Diagnosis not present

## 2018-03-29 DIAGNOSIS — R55 Syncope and collapse: Secondary | ICD-10-CM | POA: Diagnosis not present

## 2018-03-29 DIAGNOSIS — I10 Essential (primary) hypertension: Secondary | ICD-10-CM | POA: Diagnosis not present

## 2018-03-29 DIAGNOSIS — Z87898 Personal history of other specified conditions: Secondary | ICD-10-CM | POA: Diagnosis not present

## 2018-03-29 DIAGNOSIS — I2510 Atherosclerotic heart disease of native coronary artery without angina pectoris: Secondary | ICD-10-CM | POA: Diagnosis not present

## 2018-03-30 DIAGNOSIS — R569 Unspecified convulsions: Secondary | ICD-10-CM | POA: Diagnosis not present

## 2018-03-30 DIAGNOSIS — S065X9A Traumatic subdural hemorrhage with loss of consciousness of unspecified duration, initial encounter: Secondary | ICD-10-CM | POA: Diagnosis not present

## 2018-04-01 ENCOUNTER — Telehealth: Payer: Self-pay | Admitting: Cardiovascular Disease

## 2018-04-01 NOTE — Telephone Encounter (Signed)
Pt wife states pt had a seizure on Wednesday and was taken to [facility] and states some of pt medications were changed. Please call to discuss.

## 2018-04-01 NOTE — Telephone Encounter (Signed)
S/w patient wife, ok per DPR. Patient has seizure last Wednesday and fell outside on hard surface.  He was found to have subdural hematoma and lif[facility]lighted from [facility] to [facility]. At [facility], patient was taken off aspirin and plavix. Wife is concerned patient is at risk of blood clots developing and wanted to let Dr Rockey Situ know.  Advised for patient to follow directions of [facility] physicians and I will let Dr Rockey Situ know and follow up with her with any further recommendations. Patient has PCP f/u on 7/22 and Neuro f/u on 7/24. Routing to Dr Rockey Situ for review.

## 2018-04-02 ENCOUNTER — Other Ambulatory Visit: Payer: Self-pay

## 2018-04-02 ENCOUNTER — Other Ambulatory Visit: Payer: Self-pay | Admitting: Family Medicine

## 2018-04-02 ENCOUNTER — Ambulatory Visit
Admission: EM | Admit: 2018-04-02 | Discharge: 2018-04-02 | Disposition: A | Discharge status: Home / Self Care | Payer: Medicare HMO | Attending: Family Medicine | Admitting: Family Medicine

## 2018-04-02 ENCOUNTER — Ambulatory Visit (INDEPENDENT_AMBULATORY_CARE_PROVIDER_SITE_OTHER): Payer: Medicare HMO

## 2018-04-02 ENCOUNTER — Encounter: Payer: Self-pay | Admitting: Emergency Medicine

## 2018-04-02 ENCOUNTER — Telehealth: Payer: Self-pay

## 2018-04-02 DIAGNOSIS — S299XXA Unspecified injury of thorax, initial encounter: Secondary | ICD-10-CM | POA: Diagnosis not present

## 2018-04-02 DIAGNOSIS — R0781 Pleurodynia: Secondary | ICD-10-CM

## 2018-04-02 MED ORDER — OXYCODONE-ACETAMINOPHEN 7.5-325 MG PO TABS: 1.0000 | ORAL_TABLET | Freq: Four times a day (QID) | ORAL | 0 refills | Status: DC | PRN

## 2018-04-02 MED ORDER — BACLOFEN 10 MG PO TABS: 10.0000 mg | ORAL_TABLET | Freq: Three times a day (TID) | ORAL | 0 refills | Status: DC | PRN

## 2018-04-02 NOTE — ED Triage Notes (Signed)
Patient reports seizure on 07/10 and was taken to [facility]. Patient stated when he had his seizure he fell on his right side. Discharged from [place] on Friday 07/12 and started having right side rib pain on Saturday. Patient reports he can't get comfortable and having a hard time sleeping due to the pain. Patient also states he is also having pain when he takes a deep breath. No XRAYs completed for rib pain.

## 2018-04-02 NOTE — ED Provider Notes (Signed)
[facility]    CSN: [id number] Arrival date & time: 04/02/18  1542  History   Chief Complaint Chief Complaint  Patient presents with  . Muscle Pain    HPI  67 year old male presents with rib pain.  Patient was recently in the hospital after suffering a seizure and subdural hematoma.  During his seizure, he fell and injured himself.  Patient was discharged in the hospital on 7/12.  Patient states that on Saturday he began to experience right-sided lower rib pain.  Worse with deep breathing and certain ranges of motion/movement.  He has had some improvement with the pain medication he was discharged home on.  However, the pain persists and is severe at times.  Makes it difficult for him to sleep at night.  He reports pain with deep breathing but no true shortness of breath.  No fever.  No cough.  No other associated symptoms.  No other complaints.  Past Medical History:  Diagnosis Date  . Allergy   . Arthritis    knees, hands  . Hypertension   . Seizures ([place])    approx 25+ yrs ago.  Testing showed no cause. None since    Patient Active Problem List   Diagnosis Date Noted  . Old MI (myocardial infarction) 08/30/2017  . CAD, multiple vessel 08/01/2017  . NSTEMI (non-ST elevated myocardial infarction) ([place]) 07/20/2017  . Hyperlipidemia 05/10/2017  . Special screening for malignant neoplasms, colon   . Benign neoplasm of ascending colon   . Polyp of sigmoid colon   . Essential hypertension 03/03/2016    Past Surgical History:  Procedure Laterality Date  . COLONOSCOPY  2011   cleared for 10 yrs  . COLONOSCOPY WITH PROPOFOL N/A 11/06/2016   Procedure: COLONOSCOPY WITH PROPOFOL;  Surgeon: [name], MD;  Location: [place];  Service: Endoscopy;  Laterality: N/A;  . CORONARY STENT INTERVENTION N/A 07/23/2017   Procedure: CORONARY STENT INTERVENTION;  Surgeon: Yolonda Kida, MD;  Location: [place] [facility];  Service: Cardiovascular;  Laterality:  N/A;  . HERNIA REPAIR    . KNEE SURGERY Bilateral    2 on R) 1 on L)  . LEFT HEART CATH AND CORONARY ANGIOGRAPHY N/A 07/23/2017   Procedure: LEFT HEART CATH AND CORONARY ANGIOGRAPHY;  Surgeon: Minna Merritts, MD;  Location: [place] [facility];  Service: Cardiovascular;  Laterality: N/A;  . POLYPECTOMY  11/06/2016   Procedure: POLYPECTOMY;  Surgeon: [name], MD;  Location: [place];  Service: Endoscopy;;    Home Medications    Prior to Admission medications   Medication Sig Start Date End Date Taking? Authorizing Provider  acetaminophen (TYLENOL) 325 MG tablet Take 1 tablet by mouth every 8 (eight) hours as needed. 03/29/18 04/08/18 Yes [provider]  aspirin EC 81 MG EC tablet Take 1 tablet (81 mg total) daily by mouth. 07/24/17  Yes Gladstone Lighter, MD  atorvastatin (LIPITOR) 80 MG tablet Take 1 tablet (80 mg total) daily at 6 PM by mouth. 08/01/17  Yes Gollan, Kathlene November, MD  clopidogrel (PLAVIX) 75 MG tablet Take 1 tablet (75 mg total) daily by mouth. 08/01/17  Yes Gollan, Kathlene November, MD  docusate sodium (COLACE) 100 MG capsule Take 100 mg by mouth 2 (two) times daily.   Yes [provider]  levETIRAcetam (KEPPRA) 1000 MG tablet Take 1,000 mg by mouth every 12 (twelve) hours. 03/29/18 03/29/19 Yes [provider]  metoprolol tartrate (LOPRESSOR) 25 MG tablet Take 1 tablet (25 mg  total) 2 (two) times daily by mouth. 08/01/17  Yes Gollan, Kathlene November, MD  nitroGLYCERIN (NITROSTAT) 0.4 MG SL tablet Place 1 tablet (0.4 mg total) every 5 (five) minutes x 3 doses as needed under the tongue for chest pain. 07/24/17  Yes Gladstone Lighter, MD  oxyCODONE (OXY IR/ROXICODONE) 5 MG immediate release tablet Take 1 tablet by mouth every 4 (four) hours as needed. 03/29/18 04/03/18 Yes [provider]  ramipril (ALTACE) 10 MG capsule Take 2 capsules (20 mg total) daily by mouth. Patient taking differently: Take 10 mg by mouth daily. Take 1 tablet daily  08/01/17  Yes Gollan, Kathlene November, MD  baclofen (LIORESAL) 10 MG tablet Take 1 tablet (10 mg total) by mouth 3 (three) times daily as needed for muscle spasms. 04/02/18   Coral Spikes, DO  oxyCODONE-acetaminophen (PERCOCET) 7.5-325 MG tablet Take 1 tablet by mouth every 6 (six) hours as needed for severe pain. 04/02/18   Coral Spikes, DO    Family History Family History  Problem Relation Age of Onset  . CAD Father   . Lung cancer Father   . Heart failure Mother     Social History Social History   Tobacco Use  . Smoking status: Former Research scientist (life sciences)  . Smokeless tobacco: Never Used  . Tobacco comment: quit over 20 yrs ago  Substance Use Topics  . Alcohol use: Yes    Alcohol/week: 0.0 oz    Comment: 1-2 drinks/year  . Drug use: No     Allergies   Patient has no known allergies.   Review of Systems Review of Systems  Constitutional: Negative for fever.  Respiratory: Negative for cough.   Musculoskeletal:       Rib pain.   Physical Exam Triage Vital Signs ED Triage Vitals  Enc Vitals Group     BP 04/02/18 1614 (!) 164/87     Pulse Rate 04/02/18 1614 (!) 59     Resp 04/02/18 1614 16     Temp 04/02/18 1614 98.2 F (36.8 C)     Temp Source 04/02/18 1614 Oral     SpO2 04/02/18 1614 97 %     Weight 04/02/18 1612 205 lb (93 kg)     Height 04/02/18 1612 5\' 9"  (1.753 m)     Head Circumference --      Peak Flow --      Pain Score 04/02/18 1612 7     Pain Loc --      Pain Edu? --      Excl. in [place]? --    Updated Vital Signs BP (!) 164/87 (BP Location: Left Arm)   Pulse (!) 59   Temp 98.2 F (36.8 C) (Oral)   Resp 16   Ht 5\' 9"  (1.753 m)   Wt 205 lb (93 kg)   SpO2 97%   BMI 30.27 kg/m      Physical Exam  Constitutional: He is oriented to person, place, and time. He appears well-developed. No distress.  Eyes: Conjunctivae are normal. No scleral icterus.  Cardiovascular: Normal rate and regular rhythm.  Pulmonary/Chest: Effort normal and breath sounds normal. He has  no wheezes. He has no rales.  Neurological: He is alert and oriented to person, place, and time.  Skin:  Bruising noted of the left periscapular region as well as the right periscapular region.  No discrete areas of tenderness of the right lower ribs.  Psychiatric: He has a normal mood and affect. His behavior is normal.  Nursing note  and vitals reviewed.  UC Treatments / Results  Labs (all labs ordered are listed, but only abnormal results are displayed) Labs Reviewed - No data to display  EKG None  Radiology Dg Ribs Unilateral W/chest Right  Result Date: 04/02/2018 CLINICAL DATA:  Recent seizure with fall, some rib pain on the right EXAM: RIGHT RIBS AND CHEST - 3+ VIEW COMPARISON:  Chest x-ray of 07/20/2017 FINDINGS: No active infiltrate or effusion is seen. There is persistent elevation of the right hemidiaphragm with the hepatic flexure of colon beneath the right hemidiaphragm. Mediastinal and hilar contours are unremarkable. The heart is minimally enlarged and stable. Right rib detail films show minimal cortical irregularity of the anterior right eighth rib possibly due to nondisplaced fracture. No other rib abnormality is seen. IMPRESSION: 1. Cannot exclude subtle nondisplaced fracture of the anterior right eighth rib. 2. Stable chest x-ray with chronic elevation of of the right hemidiaphragm. Electronically Signed   By: Ivar Drape M.D.   On: 04/02/2018 17:19    Procedures Procedures (including critical care time)  Medications Ordered in UC Medications - No data to display  Initial Impression / Assessment and Plan / UC Course  I have reviewed the triage vital signs and the nursing notes.  Pertinent labs & imaging results that were available during my care of the patient were reviewed by me and considered in my medical decision making (see chart for details).    67 year old male presents with rib pain.  Possible nondisplaced anterior right eighth rib fracture.  No pneumonia  or atelectasis.  Treating with baclofen and oxycodone.  [place] controlled substance database reviewed.  Final Clinical Impressions(s) / UC Diagnoses   Final diagnoses:  Rib pain     Discharge Instructions     Medication as prescribed.  Take care  Dr. Lacinda Axon    ED Prescriptions    Medication Sig Dispense Auth. Provider   baclofen (LIORESAL) 10 MG tablet Take 1 tablet (10 mg total) by mouth 3 (three) times daily as needed for muscle spasms. 30 each Coral Spikes, DO   oxyCODONE-acetaminophen (PERCOCET) 7.5-325 MG tablet Take 1 tablet by mouth every 6 (six) hours as needed for severe pain. 20 tablet Coral Spikes, DO     Controlled Substance Prescriptions [place] Controlled Substance Registry consulted? Yes, I have consulted the [place] Controlled Substances Registry for this patient, and feel the risk/benefit ratio today is favorable for proceeding with this prescription for a controlled substance.  Patient received 10 oxycodone on 7/12.  Given patient's injury and no discrepancies and the database, I feel that his prescription is very reasonable. Rx given as above (5 day supply per [place] STOP Act).   Coral Spikes, [place] 04/02/18 1749

## 2018-04-02 NOTE — Telephone Encounter (Signed)
Transition Care Management Follow-up Telephone Call  How have you been since you were released from the hospital? Unable to speak with pt. Wife provided all answers below. Wife states pt is doing okay but is unable to participate in PT services d/t increased R rib pain. Wife is requesting a sooner appt and further evaluation by Dr. Ronnald Ramp. Message sent to Dr. Ronnald Ramp' CMA to address.  Do you understand why you were in the hospital? yes  Do you have a copy of your discharge instructions Yes Do you understand the discharge instructions? yes  Where were you discharged to? Home  Do you have support at home? Yes    Items Reviewed:  Medications obtained Yes  Medications reviewed: Yes  Dietary changes reviewed: yes  Home Health? Yes, Agency: [facility]  DME ordered at discharge obtained? No  Medical supplies: NA    Functional Questionnaire:   Activities of Daily Living (ADLs):   He states they are independent in the following: ambulation, bathing and hygiene, feeding, continence, grooming, toileting, dressing and medication management States they require assistance with the following: None  Any transportation issues/concerns?: yes, wife transports to appts  Any patient concerns? Yes, increased R rib pain. Wife is requesting a sooner appt. Message sent to Dr. Ronnald Ramp' CMA to review schedule for possibility of sooner appt and to further discuss concerns with Dr. Ronnald Ramp  Confirmed importance and date/time of follow-up visits scheduled with PCP: yes, although wife is requesting a sooner appt  Confirm appointment scheduled with specialist? NA  Confirmed with patient if condition begins to worsen call PCP or If it's emergency go to the ER.

## 2018-04-02 NOTE — Telephone Encounter (Signed)
She can discuss with neurology when it is appropriate to restart low-dose aspirin Will likely not require both medications unless approved by neurology

## 2018-04-02 NOTE — Discharge Instructions (Signed)
Medication as prescribed.  Take care  Dr. Charizma Gardiner  

## 2018-04-03 NOTE — Telephone Encounter (Signed)
Patient wife returning call to discuss medications . Please call home number

## 2018-04-03 NOTE — Telephone Encounter (Signed)
No answer. Left message to call back.   

## 2018-04-03 NOTE — Telephone Encounter (Signed)
Spoke with pt wife, aware per dr Rockey Situ the answer regarding aspirin and plavix will need to be asked of neurology. They have an appt with neurology 04-10-18.

## 2018-04-08 ENCOUNTER — Inpatient Hospital Stay: Payer: Medicare HMO | Admitting: Family Medicine

## 2018-04-10 DIAGNOSIS — X58XXXA Exposure to other specified factors, initial encounter: Secondary | ICD-10-CM | POA: Diagnosis not present

## 2018-04-10 DIAGNOSIS — Z87891 Personal history of nicotine dependence: Secondary | ICD-10-CM | POA: Diagnosis not present

## 2018-04-10 DIAGNOSIS — S065X9A Traumatic subdural hemorrhage with loss of consciousness of unspecified duration, initial encounter: Secondary | ICD-10-CM | POA: Diagnosis not present

## 2018-04-10 DIAGNOSIS — R569 Unspecified convulsions: Secondary | ICD-10-CM | POA: Diagnosis not present

## 2018-04-19 ENCOUNTER — Encounter: Payer: Self-pay | Admitting: Cardiovascular Disease

## 2018-04-25 DIAGNOSIS — G8929 Other chronic pain: Secondary | ICD-10-CM | POA: Diagnosis not present

## 2018-04-25 DIAGNOSIS — M25511 Pain in right shoulder: Secondary | ICD-10-CM | POA: Diagnosis not present

## 2018-04-25 DIAGNOSIS — M7551 Bursitis of right shoulder: Secondary | ICD-10-CM | POA: Diagnosis not present

## 2018-04-29 DIAGNOSIS — R69 Illness, unspecified: Secondary | ICD-10-CM | POA: Diagnosis not present

## 2018-05-09 DIAGNOSIS — S065X9A Traumatic subdural hemorrhage with loss of consciousness of unspecified duration, initial encounter: Secondary | ICD-10-CM | POA: Diagnosis not present

## 2018-05-09 DIAGNOSIS — R51 Headache: Secondary | ICD-10-CM | POA: Diagnosis not present

## 2018-05-09 DIAGNOSIS — R569 Unspecified convulsions: Secondary | ICD-10-CM | POA: Diagnosis not present

## 2018-05-09 DIAGNOSIS — R519 Headache, unspecified: Secondary | ICD-10-CM | POA: Insufficient documentation

## 2018-05-10 ENCOUNTER — Other Ambulatory Visit: Payer: Self-pay

## 2018-05-23 DIAGNOSIS — R569 Unspecified convulsions: Secondary | ICD-10-CM | POA: Diagnosis not present

## 2018-05-23 DIAGNOSIS — I6200 Nontraumatic subdural hemorrhage, unspecified: Secondary | ICD-10-CM | POA: Diagnosis not present

## 2018-05-23 DIAGNOSIS — Z87891 Personal history of nicotine dependence: Secondary | ICD-10-CM | POA: Diagnosis not present

## 2018-05-23 DIAGNOSIS — S065X9A Traumatic subdural hemorrhage with loss of consciousness of unspecified duration, initial encounter: Secondary | ICD-10-CM | POA: Diagnosis not present

## 2018-05-23 DIAGNOSIS — S065X9D Traumatic subdural hemorrhage with loss of consciousness of unspecified duration, subsequent encounter: Secondary | ICD-10-CM | POA: Diagnosis not present

## 2018-05-23 DIAGNOSIS — X58XXXD Exposure to other specified factors, subsequent encounter: Secondary | ICD-10-CM | POA: Diagnosis not present

## 2018-06-04 DIAGNOSIS — M25311 Other instability, right shoulder: Secondary | ICD-10-CM | POA: Diagnosis not present

## 2018-06-04 DIAGNOSIS — M25511 Pain in right shoulder: Secondary | ICD-10-CM | POA: Diagnosis not present

## 2018-06-04 DIAGNOSIS — G8929 Other chronic pain: Secondary | ICD-10-CM | POA: Diagnosis not present

## 2018-06-05 ENCOUNTER — Other Ambulatory Visit: Payer: Self-pay | Admitting: Orthopedic Surgery

## 2018-06-05 DIAGNOSIS — G8929 Other chronic pain: Secondary | ICD-10-CM

## 2018-06-05 DIAGNOSIS — M25511 Pain in right shoulder: Principal | ICD-10-CM

## 2018-06-05 DIAGNOSIS — M25311 Other instability, right shoulder: Secondary | ICD-10-CM

## 2018-06-06 DIAGNOSIS — R569 Unspecified convulsions: Secondary | ICD-10-CM | POA: Diagnosis not present

## 2018-06-07 ENCOUNTER — Ambulatory Visit: Payer: Medicare HMO | Admitting: Family Medicine

## 2018-06-07 ENCOUNTER — Encounter: Payer: Self-pay | Admitting: Family Medicine

## 2018-06-07 ENCOUNTER — Ambulatory Visit
Admission: RE | Admit: 2018-06-07 | Discharge: 2018-06-07 | Disposition: A | Discharge status: Home / Self Care | Payer: Medicare HMO | Source: Ambulatory Visit | Attending: Orthopedic Surgery | Admitting: Orthopedic Surgery

## 2018-06-07 VITALS — BP 164/86 | HR 80 | Ht 69.0 in | Wt 201.0 lb

## 2018-06-07 DIAGNOSIS — I10 Essential (primary) hypertension: Secondary | ICD-10-CM | POA: Diagnosis not present

## 2018-06-07 DIAGNOSIS — R937 Abnormal findings on diagnostic imaging of other parts of musculoskeletal system: Secondary | ICD-10-CM | POA: Insufficient documentation

## 2018-06-07 DIAGNOSIS — M19011 Primary osteoarthritis, right shoulder: Secondary | ICD-10-CM | POA: Diagnosis not present

## 2018-06-07 DIAGNOSIS — G8929 Other chronic pain: Secondary | ICD-10-CM | POA: Diagnosis not present

## 2018-06-07 DIAGNOSIS — I2510 Atherosclerotic heart disease of native coronary artery without angina pectoris: Secondary | ICD-10-CM

## 2018-06-07 DIAGNOSIS — E785 Hyperlipidemia, unspecified: Secondary | ICD-10-CM | POA: Diagnosis not present

## 2018-06-07 DIAGNOSIS — M25511 Pain in right shoulder: Secondary | ICD-10-CM

## 2018-06-07 DIAGNOSIS — M75121 Complete rotator cuff tear or rupture of right shoulder, not specified as traumatic: Secondary | ICD-10-CM | POA: Diagnosis not present

## 2018-06-07 DIAGNOSIS — M7541 Impingement syndrome of right shoulder: Secondary | ICD-10-CM

## 2018-06-07 DIAGNOSIS — Z01810 Encounter for preprocedural cardiovascular examination: Secondary | ICD-10-CM | POA: Diagnosis not present

## 2018-06-07 DIAGNOSIS — M25311 Other instability, right shoulder: Secondary | ICD-10-CM

## 2018-06-07 MED ORDER — ATORVASTATIN CALCIUM 80 MG PO TABS: 80.0000 mg | ORAL_TABLET | Freq: Every day | ORAL | 1 refills | Status: DC

## 2018-06-07 MED ORDER — CLOPIDOGREL BISULFATE 75 MG PO TABS: 75.0000 mg | ORAL_TABLET | Freq: Every day | ORAL | 1 refills | Status: DC

## 2018-06-07 MED ORDER — RAMIPRIL 10 MG PO CAPS: 10.0000 mg | ORAL_CAPSULE | Freq: Two times a day (BID) | ORAL | 1 refills | Status: DC

## 2018-06-07 MED ORDER — METOPROLOL TARTRATE 50 MG PO TABS: 50.0000 mg | ORAL_TABLET | Freq: Two times a day (BID) | ORAL | 1 refills | Status: DC

## 2018-06-07 NOTE — Progress Notes (Signed)
Date:  06/07/2018   Name:  George Doyle   DOB:  1951-02-23   MRN:  [id number]   Chief Complaint: Hypertension (has a new b/p cuff- b/p 170-90/ 70-90s ) Hypertension  This is a chronic problem. The current episode started more than 1 year ago. The problem has been gradually improving since onset. The problem is controlled. Pertinent negatives include no anxiety, blurred vision, chest pain, headaches, malaise/fatigue, neck pain, orthopnea, palpitations, peripheral edema, PND, shortness of breath or sweats. There are no associated agents to hypertension. Risk factors for coronary artery disease include dyslipidemia, obesity and male gender. Past treatments include ACE inhibitors and beta blockers. The current treatment provides moderate improvement. There are no compliance problems.  Hypertensive end-organ damage includes CAD/MI and CVA. There is no history of angina, kidney disease, heart failure, left ventricular hypertrophy, PVD or retinopathy. There is no history of chronic renal disease, a hypertension causing med or renovascular disease.  Hyperlipidemia  This is a chronic problem. The current episode started more than 1 year ago. The problem is controlled. Recent lipid tests were reviewed and are normal. He has no history of chronic renal disease, diabetes, hypothyroidism, liver disease, obesity or nephrotic syndrome. Pertinent negatives include no chest pain, focal weakness, leg pain, myalgias or shortness of breath. Current antihyperlipidemic treatment includes statins. The current treatment provides mild improvement of lipids. There are no compliance problems.   Heart Problem  This is a chronic problem. The current episode started more than 1 year ago (NSTEMI). The problem has been gradually improving. Pertinent negatives include no abdominal pain, arthralgias, change in bowel habit, chest pain, chills, coughing, fever, headaches, myalgias, nausea, neck pain, rash or sore throat.  Nothing aggravates the symptoms. Treatments tried: antiplatelet. The treatment provided moderate relief.    Review of Systems  Constitutional: Negative for chills, fever and malaise/fatigue.  HENT: Negative for drooling, ear discharge, ear pain and sore throat.   Eyes: Negative for blurred vision.  Respiratory: Negative for cough, shortness of breath and wheezing.   Cardiovascular: Negative for chest pain, palpitations, orthopnea, leg swelling and PND.  Gastrointestinal: Negative for abdominal pain, blood in stool, change in bowel habit, constipation, diarrhea and nausea.  Endocrine: Negative for polydipsia.  Genitourinary: Negative for dysuria, frequency, hematuria and urgency.  Musculoskeletal: Negative for arthralgias, back pain, myalgias and neck pain.  Skin: Negative for rash.  Allergic/Immunologic: Negative for environmental allergies.  Neurological: Negative for dizziness, focal weakness and headaches.  Hematological: Does not bruise/bleed easily.  Psychiatric/Behavioral: Negative for suicidal ideas. The patient is not nervous/anxious.     Patient Active Problem List   Diagnosis Date Noted  . Old MI (myocardial infarction) 08/30/2017  . CAD, multiple vessel 08/01/2017  . NSTEMI (non-ST elevated myocardial infarction) ([place]) 07/20/2017  . Hyperlipidemia 05/10/2017  . Special screening for malignant neoplasms, colon   . Benign neoplasm of ascending colon   . Polyp of sigmoid colon   . Essential hypertension 03/03/2016    No Known Allergies  Past Surgical History:  Procedure Laterality Date  . COLONOSCOPY  2011   cleared for 10 yrs  . COLONOSCOPY WITH PROPOFOL N/A 11/06/2016   Procedure: COLONOSCOPY WITH PROPOFOL;  Surgeon: [name], MD;  Location: [place];  Service: Endoscopy;  Laterality: N/A;  . CORONARY STENT INTERVENTION N/A 07/23/2017   Procedure: CORONARY STENT INTERVENTION;  Surgeon: Yolonda Kida, MD;  Location: [place] [facility];  Service:  Cardiovascular;  Laterality: N/A;  . HERNIA REPAIR    .  KNEE SURGERY Bilateral    2 on R) 1 on L)  . LEFT HEART CATH AND CORONARY ANGIOGRAPHY N/A 07/23/2017   Procedure: LEFT HEART CATH AND CORONARY ANGIOGRAPHY;  Surgeon: Minna Merritts, MD;  Location: [place] [facility];  Service: Cardiovascular;  Laterality: N/A;  . POLYPECTOMY  11/06/2016   Procedure: POLYPECTOMY;  Surgeon: [name], MD;  Location: [place];  Service: Endoscopy;;    Social History   Tobacco Use  . Smoking status: Former Research scientist (life sciences)  . Smokeless tobacco: Never Used  . Tobacco comment: quit over 20 yrs ago  Substance Use Topics  . Alcohol use: Yes    Alcohol/week: 0.0 standard drinks    Comment: 1-2 drinks/year  . Drug use: No     Medication list has been reviewed and updated.  Current Meds  Medication Sig  . aspirin EC 81 MG EC tablet Take 1 tablet (81 mg total) daily by mouth. (Patient taking differently: Take 81 mg by mouth 3 (three) times a week. )  . atorvastatin (LIPITOR) 80 MG tablet Take 1 tablet (80 mg total) by mouth daily at 6 PM.  . clopidogrel (PLAVIX) 75 MG tablet Take 1 tablet (75 mg total) by mouth daily.  Marland Kitchen docusate sodium (COLACE) 100 MG capsule Take 100 mg by mouth 2 (two) times daily.  Marland Kitchen levETIRAcetam (KEPPRA) 500 MG tablet Take 1 tablet by mouth 2 (two) times daily. Dr Melrose Nakayama  . nitroGLYCERIN (NITROSTAT) 0.4 MG SL tablet Place 1 tablet (0.4 mg total) every 5 (five) minutes x 3 doses as needed under the tongue for chest pain.  . nortriptyline (PAMELOR) 10 MG capsule Take 1 capsule by mouth at bedtime. Dr Melrose Nakayama  . oxyCODONE-acetaminophen (PERCOCET) 7.5-325 MG tablet Take 1 tablet by mouth every 6 (six) hours as needed for severe pain.  . ramipril (ALTACE) 10 MG capsule Take 1 capsule (10 mg total) by mouth 2 (two) times daily.  . [DISCONTINUED] atorvastatin (LIPITOR) 80 MG tablet Take 1 tablet (80 mg total) daily at 6 PM by mouth.  . [DISCONTINUED] clopidogrel (PLAVIX) 75 MG  tablet Take 1 tablet (75 mg total) daily by mouth.  . [DISCONTINUED] metoprolol tartrate (LOPRESSOR) 25 MG tablet Take 1 tablet (25 mg total) 2 (two) times daily by mouth.  . [DISCONTINUED] ramipril (ALTACE) 10 MG capsule Take 10 mg by mouth 2 (two) times daily.    PHQ 2/9 Scores 06/07/2018 11/05/2017 09/24/2017 05/10/2017  PHQ - 2 Score 2 1 1  0  PHQ- 9 Score 4 4 3  0    Physical Exam  Constitutional: He is oriented to person, place, and time.  HENT:  Head: Normocephalic.  Right Ear: External ear normal.  Left Ear: External ear normal.  Nose: Nose normal.  Mouth/Throat: Oropharynx is clear and moist.  Eyes: Pupils are equal, round, and reactive to light. Conjunctivae and EOM are normal. Right eye exhibits no discharge. Left eye exhibits no discharge. No scleral icterus.  Neck: Normal range of motion. Neck supple. No JVD present. No tracheal deviation present. No thyromegaly present.  Cardiovascular: Normal rate, regular rhythm, normal heart sounds and intact distal pulses. Exam reveals no gallop and no friction rub.  No murmur heard. Pulmonary/Chest: Breath sounds normal. No respiratory distress. He has no wheezes. He has no rales.  Abdominal: Soft. Bowel sounds are normal. He exhibits no mass. There is no hepatosplenomegaly. There is no tenderness. There is no rebound, no guarding and no CVA tenderness.  Musculoskeletal: Normal range of motion. He exhibits  no edema or tenderness.  Lymphadenopathy:    He has no cervical adenopathy.  Neurological: He is alert and oriented to person, place, and time. He has normal strength and normal reflexes. No cranial nerve deficit.  Skin: Skin is warm. No rash noted.    BP (!) 164/86   Pulse 80   Ht 5\' 9"  (1.753 m)   Wt 201 lb (91.2 kg)   BMI 29.68 kg/m   Assessment and Plan 1. Essential hypertension Elevated readings last 4-6 weeks. Will increase metoprolol dosage to 50 mg bid. Check renal panel - ramipril (ALTACE) 10 MG capsule; Take 1 capsule  (10 mg total) by mouth 2 (two) times daily.  Dispense: 180 capsule; Refill: 1 - metoprolol tartrate (LOPRESSOR) 50 MG tablet; Take 1 tablet (50 mg total) by mouth 2 (two) times daily.  Dispense: 180 tablet; Refill: 1 - Renal Function Panel - Ambulatory referral to Cardiology  2. Hyperlipidemia, unspecified hyperlipidemia type History of cad/will continue atorvastatin 80 mg. Check lipid panel fasting - atorvastatin (LIPITOR) 80 MG tablet; Take 1 tablet (80 mg total) by mouth daily at 6 PM.  Dispense: 90 tablet; Refill: 1 - Lipid panel  3. CAD, multiple vessel Followed with Dr Saundra Shelling. Neurology resumed plavix. Recheck scheduled - clopidogrel (PLAVIX) 75 MG tablet; Take 1 tablet (75 mg total) by mouth daily.  Dispense: 90 tablet; Refill: 1 - Ambulatory referral to Cardiology  4. Impingement syndrome of right shoulder Anticipate upcoming surgery. Will schedule cardiology consult for upcoming clearance.Reviewed MRI. Dr. Mac[facility]  06/07/2018

## 2018-06-11 ENCOUNTER — Other Ambulatory Visit: Payer: Medicare HMO

## 2018-06-11 DIAGNOSIS — I10 Essential (primary) hypertension: Secondary | ICD-10-CM | POA: Diagnosis not present

## 2018-06-11 DIAGNOSIS — E785 Hyperlipidemia, unspecified: Secondary | ICD-10-CM | POA: Diagnosis not present

## 2018-06-12 LAB — RENAL FUNCTION PANEL
ALBUMIN: 4.2 g/dL (ref 3.6–4.8)
BUN/Creatinine Ratio: 16 (ref 10–24)
BUN: 17 mg/dL (ref 8–27)
CALCIUM: 9.3 mg/dL (ref 8.6–10.2)
CO2: 25 mmol/L (ref 20–29)
CREATININE: 1.04 mg/dL (ref 0.76–1.27)
Chloride: 100 mmol/L (ref 96–106)
GFR calc Af Amer: 85 mL/min/{1.73_m2} (ref >59)
GFR calc non Af Amer: 73 mL/min/{1.73_m2} (ref >59)
Glucose: 76 mg/dL (ref 65–99)
Phosphorus: 2.6 mg/dL (ref 2.5–4.5)
Potassium: 4.5 mmol/L (ref 3.5–5.2)
Sodium: 142 mmol/L (ref 134–144)

## 2018-06-12 LAB — LIPID PANEL
CHOLESTEROL TOTAL: 118 mg/dL (ref 100–199)
Chol/HDL Ratio: 2.1 ratio (ref 0.0–5.0)
HDL: 55 mg/dL (ref >39)
LDL Calculated: 48 mg/dL (ref 0–99)
TRIGLYCERIDES: 77 mg/dL (ref 0–149)
VLDL Cholesterol Cal: 15 mg/dL (ref 5–40)

## 2018-06-18 DIAGNOSIS — R51 Headache: Secondary | ICD-10-CM | POA: Diagnosis not present

## 2018-06-18 DIAGNOSIS — S065X9A Traumatic subdural hemorrhage with loss of consciousness of unspecified duration, initial encounter: Secondary | ICD-10-CM | POA: Diagnosis not present

## 2018-06-18 DIAGNOSIS — R569 Unspecified convulsions: Secondary | ICD-10-CM | POA: Diagnosis not present

## 2018-06-19 ENCOUNTER — Telehealth: Payer: Self-pay | Admitting: Cardiovascular Disease

## 2018-06-19 DIAGNOSIS — S46101A Unspecified injury of muscle, fascia and tendon of long head of biceps, right arm, initial encounter: Secondary | ICD-10-CM | POA: Insufficient documentation

## 2018-06-19 DIAGNOSIS — S46011A Strain of muscle(s) and tendon(s) of the rotator cuff of right shoulder, initial encounter: Secondary | ICD-10-CM | POA: Diagnosis not present

## 2018-06-19 DIAGNOSIS — M21821 Other specified acquired deformities of right upper arm: Secondary | ICD-10-CM | POA: Insufficient documentation

## 2018-06-19 DIAGNOSIS — M7581 Other shoulder lesions, right shoulder: Secondary | ICD-10-CM | POA: Insufficient documentation

## 2018-06-19 NOTE — Telephone Encounter (Signed)
S/w patient's wife, ok per DPR. Patient reports no new shortness of breath or chest pain. He has been having elevated BP over the past month. Saw Dr Otilio Miu on 06/07/18 who adjusted medications: 1- increased metoprolol to 50 mg BID. 2- Ramipril 10 mg BID.   Still has the following BPs: 183/94 146/82 188/97 198/103 189/100 10/1 at neuro appointment 172/89  HR runs between 47-62. Scheduled appointment for patient to see Christell Faith, PA-C on 06/27/18 since he has not been seen since earlier this year.  Routing to Dr Rockey Situ to advise on addressing clearance now or wait until appointment.

## 2018-06-19 NOTE — Telephone Encounter (Signed)
° °  [facility] Pre-operative Risk Assessment    Request for surgical clearance:  1. What type of surgery is being performed? Right Shoulder Arthroscopy with debridement decompression rotator cuff repair and biceps tenodesis   2. When is this surgery scheduled? tbd  3. What type of clearance is required (medical clearance vs. Pharmacy clearance to hold med vs. Both)? Both   4. Are there any medications that need to be held prior to surgery and how long? Please advise on anticoag   5. Practice name and name of physician performing surgery?  [facility]   Dr. Roland Rack   6. What is your office phone number [phone]    7.   What is your office fax number [phone]  8.   Anesthesia type (None, local, MAC, general) ?  Not noted    Clarisse Gouge 06/19/2018, 3:24 PM  _________________________________________________________________   (provider comments below)

## 2018-06-21 ENCOUNTER — Ambulatory Visit: Payer: Medicare HMO | Admitting: Cardiovascular Disease

## 2018-06-21 ENCOUNTER — Encounter: Payer: Self-pay | Admitting: Cardiovascular Disease

## 2018-06-21 VITALS — BP 158/80 | HR 50 | Ht 69.0 in | Wt 202.5 lb

## 2018-06-21 DIAGNOSIS — I214 Non-ST elevation (NSTEMI) myocardial infarction: Secondary | ICD-10-CM

## 2018-06-21 DIAGNOSIS — I25118 Atherosclerotic heart disease of native coronary artery with other forms of angina pectoris: Secondary | ICD-10-CM | POA: Diagnosis not present

## 2018-06-21 DIAGNOSIS — I10 Essential (primary) hypertension: Secondary | ICD-10-CM

## 2018-06-21 DIAGNOSIS — E7800 Pure hypercholesterolemia, unspecified: Secondary | ICD-10-CM

## 2018-06-21 DIAGNOSIS — Z01810 Encounter for preprocedural cardiovascular examination: Secondary | ICD-10-CM

## 2018-06-21 DIAGNOSIS — G40909 Epilepsy, unspecified, not intractable, without status epilepticus: Secondary | ICD-10-CM

## 2018-06-21 MED ORDER — HYDROCHLOROTHIAZIDE 25 MG PO TABS: 25.0000 mg | ORAL_TABLET | Freq: Every day | ORAL | 4 refills | Status: DC

## 2018-06-21 NOTE — Patient Instructions (Signed)
Medication Instructions:   Please start HCTZ 25 mg one a day, morning  Labwork:  No new labs needed  Testing/Procedures:  No further testing at this time   Follow-Up: It was a pleasure seeing you in the office today. Please call us if you have new issues that need to be addressed before your next appt.  [phone]  Your physician wants you to follow-up in: 12 months.  You will receive a reminder letter in the mail two months in advance. If you don't receive a letter, please call our office to schedule the follow-up appointment.  If you need a refill on your cardiac medications before your next appointment, please call your pharmacy.  For educational health videos Log in to : [email or web address] Or : [email or web address], password : [email or web address]

## 2018-06-21 NOTE — Progress Notes (Signed)
Cardiology Office Note  Date:  06/21/2018   ID:  George Doyle, DOB June 21, 1951, MRN [id number]  PCP:  Juline Patch, MD   Chief Complaint  Patient presents with  . other    Cardiac clearance for right shoulder surgery (rotator cuff), surgeon is Dr. Roland Rack & has HTN. Meds reviewed by the pt. verbally. Pt. c/o shortness of breath.     HPI:  George Doyle is a pleasant 67 yo male with history of  Seizure disorder 2005 in July 2019 HTN  former tobacco abuse  admitted with chest pain 07/21/2017 NSTEMI, TNT 14 Cath 07/23/2017  , Occluded proximal RCA, collaterals from left to right, Moderate to severe mid Left circumflex dz, tortuous vessel, calcified Moderate inferior wall hypokinesis  attempt PCI to the RCA was unsuccessful, possibly went subintimal Seen by Dr. Irish Lack August 30, 2017 Medical management recommended of his occluded RCA He presents for follow-up of his coronary artery disease, stable angina, preop cardiovascular  In follow-up today he is not exercising, wife feels he is very deconditioned Was in better shape after cardiac rehab earlier in 2019 Chronic shortness of breath with exertion but he denies any significant change over the past year When he does develop shortness of breath he stops to recover  Blood pressure has been running high at home 170 sometimes 765 systolic Taking ramipril 20 twice daily with metoprolol 50 twice daily Reports having asymptomatic bradycardia  Reports having severe right shoulder discomfort Being scheduled for shoulder surgery with Dr. Roland Rack. Currently taking aspirin 3 days a week with Plavix daily  Previous history discussed with him, seen in the emergency room March 27, 2018 for seizure Hospital records reviewed with the patient in detail  full body tonic-clonic seizure-like activity, hurt his right shoulder at that time CT scan head subdural hematoma, Received IV Keppra.   was transferred to [facility] records reviewed Aspirin Plavix initially held  He is scheduled to have a follow-up EEG  Echo November 2018  ejection fraction was in the range of 50% to 55%. Aorta: Aortic root was mildly dilated, 3.6 cm - Ascending aorta: The ascending aorta was mildly dilated, 4.1 cm  EKG personally reviewed by myself on todays visit Shows normal sinus rhythm rate 50 bpm ST abnormality V4 through V6, 1 and aVL old inferior MI No significant change compared to prior EKGs   PMH:   has a past medical history of Allergy, Arthritis, Hypertension, and Seizures ([place]).  PSH:    Past Surgical History:  Procedure Laterality Date  . COLONOSCOPY  2011   cleared for 10 yrs  . COLONOSCOPY WITH PROPOFOL N/A 11/06/2016   Procedure: COLONOSCOPY WITH PROPOFOL;  Surgeon: [name], MD;  Location: [place];  Service: Endoscopy;  Laterality: N/A;  . CORONARY STENT INTERVENTION N/A 07/23/2017   Procedure: CORONARY STENT INTERVENTION;  Surgeon: Yolonda Kida, MD;  Location: [place] [facility];  Service: Cardiovascular;  Laterality: N/A;  . HERNIA REPAIR    . KNEE SURGERY Bilateral    2 on R) 1 on L)  . LEFT HEART CATH AND CORONARY ANGIOGRAPHY N/A 07/23/2017   Procedure: LEFT HEART CATH AND CORONARY ANGIOGRAPHY;  Surgeon: Minna Merritts, MD;  Location: [place] [facility];  Service: Cardiovascular;  Laterality: N/A;  . POLYPECTOMY  11/06/2016   Procedure: POLYPECTOMY;  Surgeon: [name], MD;  Location: [place];  Service: Endoscopy;;    Current Outpatient Medications  Medication Sig Dispense Refill  .  aspirin EC 81 MG EC tablet Take 1 tablet (81 mg total) daily by mouth. (Patient taking differently: Take 81 mg by mouth 3 (three) times a week. ) 30 tablet 1  . atorvastatin (LIPITOR) 80 MG tablet Take 1 tablet (80 mg total) by mouth daily at 6 PM. 90 tablet 1  . clopidogrel (PLAVIX) 75 MG tablet Take 1 tablet (75 mg total) by mouth daily. 90 tablet 1  . levETIRAcetam  (KEPPRA) 500 MG tablet Take 1 tablet by mouth 2 (two) times daily. Dr Melrose Nakayama    . metoprolol tartrate (LOPRESSOR) 50 MG tablet Take 1 tablet (50 mg total) by mouth 2 (two) times daily. 180 tablet 1  . nitroGLYCERIN (NITROSTAT) 0.4 MG SL tablet Place 1 tablet (0.4 mg total) every 5 (five) minutes x 3 doses as needed under the tongue for chest pain. 30 tablet 2  . nortriptyline (PAMELOR) 10 MG capsule Take 1 capsule by mouth at bedtime. Dr Melrose Nakayama    . ramipril (ALTACE) 10 MG capsule Take 1 capsule (10 mg total) by mouth 2 (two) times daily. (Patient taking differently: Take 20 mg by mouth 2 (two) times daily. ) 180 capsule 1   No current facility-administered medications for this visit.     Allergies:   Patient has no known allergies.   Social History:  The patient  reports that he has quit smoking. He has never used smokeless tobacco. He reports that he drinks alcohol. He reports that he does not use drugs.   Family History:   family history includes CAD in his father; Heart failure in his mother; Lung cancer in his father.   Review of Systems: Review of Systems  Constitutional: Positive for malaise/fatigue.  Respiratory: Positive for shortness of breath.   Cardiovascular: Negative.   Gastrointestinal: Negative.   Musculoskeletal: Negative.   Neurological: Negative.   Psychiatric/Behavioral: Negative.   All other systems reviewed and are negative.   PHYSICAL EXAM: VS:  BP (!) 158/80 (BP Location: Right Arm, Patient Position: Sitting, Cuff Size: Normal)   Pulse (!) 50   Ht 5\' 9"  (1.753 m)   Wt 202 lb 8 oz (91.9 kg)   BMI 29.90 kg/m  , BMI Body mass index is 29.9 kg/m. GEN: Well nourished, well developed, in no acute distress , obese HEENT: normal  Neck: no JVD, carotid bruits, or masses Cardiac: RRR; no murmurs, rubs, or gallops,no edema  Respiratory:  clear to auscultation bilaterally, normal work of breathing GI: soft, nontender, nondistended, + BS MS: no deformity or atrophy   Skin: warm and dry, no rash Neuro:  Strength and sensation are intact Psych: euthymic mood, full affect  Recent Labs: 03/27/2018: ALT 15; Hemoglobin 14.6; Platelets 194 06/11/2018: BUN 17; Creatinine, Ser 1.04; Potassium 4.5; Sodium 142    Lipid Panel Lab Results  Component Value Date   CHOL 118 06/11/2018   HDL 55 06/11/2018   LDLCALC 48 06/11/2018   TRIG 77 06/11/2018      Wt Readings from Last 3 Encounters:  06/21/18 202 lb 8 oz (91.9 kg)  06/07/18 201 lb (91.2 kg)  04/02/18 205 lb (93 kg)      ASSESSMENT AND PLAN:  Preop cardiovascular Acceptable risk for shoulder surgery No further cardiac testing needed Recommend he stay on aspirin through the procedure Okay to stop Plavix 5 days prior to the procedure Restart Plavix when approved by surgeon Blood pressure medication changes as below  NSTEMI (non-ST elevated myocardial infarction) ([place]) - Plan: EKG 12-Lead Chronic shortness  of breath on exertion Likely stable angina, no signs of decompensation or unstable angina symptoms Recommended regular exercise program for conditioning  Essential hypertension  Blood pressure running high today Long discussion concerning various treatment options, types of medication We will add HCTZ 25 mg daily Recommend base metabolic panel in 1 month.  If he does not get this through preop anesthesia evaluation we would do this through our office Other options include amlodipine, Cardura  CAD, multiple vessel Moderate to severe left circumflex disease tortuous vessel, occluded RCA  stable angina symptoms,  Long discussion concerning need to start exercise program Discussed walking daily  Mixed hyperlipidemia Cholesterol is at goal on the current lipid regimen. No changes to the medications were made.  Stable  Seizures Previous hospital admissions discussed with him from July 2019 No further episodes, scheduled for EEG  Disposition:   F/U  12 months  Long discussion  concerning coronary disease, stable angina, shortness of breath symptoms, conditioning, preop cardiovascular preparation, hypertension, recent seizure history.  Records reviewed as above  Total encounter time more than 45 minutes  Greater than 50% was spent in counseling and coordination of care with the patient    No orders of the defined types were placed in this encounter.    Signed, Esmond Plants, M.D., Ph.D. 06/21/2018  [place], [place]

## 2018-06-25 DIAGNOSIS — R69 Illness, unspecified: Secondary | ICD-10-CM | POA: Diagnosis not present

## 2018-06-27 ENCOUNTER — Ambulatory Visit: Payer: Medicare HMO | Admitting: Physician Assistant

## 2018-07-12 ENCOUNTER — Other Ambulatory Visit: Payer: Medicare HMO

## 2018-07-15 ENCOUNTER — Encounter
Admission: RE | Admit: 2018-07-15 | Discharge: 2018-07-15 | Disposition: A | Discharge status: Home / Self Care | Payer: Medicare HMO | Source: Ambulatory Visit | Attending: Surgery | Admitting: Surgery

## 2018-07-15 ENCOUNTER — Other Ambulatory Visit: Payer: Self-pay

## 2018-07-15 DIAGNOSIS — I2510 Atherosclerotic heart disease of native coronary artery without angina pectoris: Secondary | ICD-10-CM | POA: Diagnosis not present

## 2018-07-15 DIAGNOSIS — M19042 Primary osteoarthritis, left hand: Secondary | ICD-10-CM | POA: Diagnosis not present

## 2018-07-15 DIAGNOSIS — M19041 Primary osteoarthritis, right hand: Secondary | ICD-10-CM | POA: Diagnosis not present

## 2018-07-15 DIAGNOSIS — Z01812 Encounter for preprocedural laboratory examination: Secondary | ICD-10-CM

## 2018-07-15 DIAGNOSIS — Z87891 Personal history of nicotine dependence: Secondary | ICD-10-CM | POA: Diagnosis not present

## 2018-07-15 DIAGNOSIS — S46211A Strain of muscle, fascia and tendon of other parts of biceps, right arm, initial encounter: Secondary | ICD-10-CM | POA: Diagnosis not present

## 2018-07-15 DIAGNOSIS — I252 Old myocardial infarction: Secondary | ICD-10-CM | POA: Diagnosis not present

## 2018-07-15 DIAGNOSIS — X58XXXA Exposure to other specified factors, initial encounter: Secondary | ICD-10-CM | POA: Diagnosis not present

## 2018-07-15 DIAGNOSIS — S46011A Strain of muscle(s) and tendon(s) of the rotator cuff of right shoulder, initial encounter: Secondary | ICD-10-CM | POA: Diagnosis present

## 2018-07-15 DIAGNOSIS — Z7902 Long term (current) use of antithrombotics/antiplatelets: Secondary | ICD-10-CM | POA: Diagnosis not present

## 2018-07-15 DIAGNOSIS — M170 Bilateral primary osteoarthritis of knee: Secondary | ICD-10-CM | POA: Diagnosis not present

## 2018-07-15 DIAGNOSIS — Z79899 Other long term (current) drug therapy: Secondary | ICD-10-CM | POA: Diagnosis not present

## 2018-07-15 DIAGNOSIS — Z7982 Long term (current) use of aspirin: Secondary | ICD-10-CM | POA: Diagnosis not present

## 2018-07-15 DIAGNOSIS — R569 Unspecified convulsions: Secondary | ICD-10-CM | POA: Diagnosis not present

## 2018-07-15 DIAGNOSIS — I10 Essential (primary) hypertension: Secondary | ICD-10-CM | POA: Diagnosis not present

## 2018-07-15 HISTORY — DX: Pneumonia, unspecified organism: J18.9

## 2018-07-15 HISTORY — DX: Acute myocardial infarction, unspecified: I21.9

## 2018-07-15 LAB — CBC
HEMATOCRIT: 44.5 % (ref 39.0–52.0)
Hemoglobin: 14.8 g/dL (ref 13.0–17.0)
MCH: 29.8 pg (ref 26.0–34.0)
MCHC: 33.3 g/dL (ref 30.0–36.0)
MCV: 89.5 fL (ref 80.0–100.0)
NRBC: 0 % (ref 0.0–0.2)
PLATELETS: 209 10*3/uL (ref 150–400)
RBC: 4.97 MIL/uL (ref 4.22–5.81)
RDW: 12.9 % (ref 11.5–15.5)
WBC: 9.1 10*3/uL (ref 4.0–10.5)

## 2018-07-15 NOTE — Patient Instructions (Signed)
  Your procedure is scheduled on: Thursday July 18, 2018 Report to Same Day Surgery 2nd floor [facility] Entrance-take elevator on left to 2nd floor.  Check in with surgery information desk.) To find out your arrival time, call [phone] 1:00-3:00 PM on Wednesday July 17, 2018  Remember: Instructions that are not followed completely may result in serious medical risk, up to and including death, or upon the discretion of your surgeon and anesthesiologist your surgery may need to be rescheduled.    __x__ 1. Do not eat food (including mints, candies, chewing gum) after midnight the night before your procedure. You may drink clear liquids up to 2 hours before you are scheduled to arrive at the hospital for your procedure.  Do not drink anything within 2 hours of your scheduled arrival to the hospital.  Approved clear liquids:  --Water or Apple juice without pulp  --Clear carbohydrate beverage such as Gatorade or Powerade  --Black Coffee or Clear Tea (No milk, no creamers, do not add anything to the coffee or tea)    __x__ 2. No Alcohol for 24 hours before or after surgery.   __x__ 3. No Smoking or e-cigarettes for 24 hours before surgery.  Do not use any chewable tobacco products for at least 6 hours before surgery.   __x__ 4. Notify your doctor if there is any change in your medical condition (cold, fever, infections).   __x__ 5. On the morning of surgery brush your teeth with toothpaste and water.  You may rinse your mouth with mouthwash if you wish.  Do not swallow any toothpaste or mouthwash.  Please read over the following fact sheets that you were given:   [facility] Preparing for Surgery and/or MRSA Information    __x__ Use CHG Soap or Sage wipes as directed on instruction sheet   Do not wear jewelry on the day of surgery.  Do not wear lotions, powders, deodorant, or perfumes.   Do not shave below the face/neck 48 hours prior to surgery.   Do not bring  valuables to the hospital.    [facility] is not responsible for any belongings or valuables.               Contacts, dentures or bridgework may not be worn into surgery.  Leave your suitcase in the car. After surgery it may be brought to your room.  For patients admitted to the hospital, discharge time is determined by your treatment team.  For patients discharged on the day of surgery, you will NOT be permitted to drive yourself home.  You must have a responsible adult with you for 24 hours after surgery.  __x__ Take anti-hypertensive listed below, cardiac, seizure, asthma, anti-reflux and psychiatric medicines on the morning of surgery with a small sip of water. These include:  1. Levetiracetam (Keppra)  2. Metoprolol (Lopressor)  Do NOT take your Ramipril (Altace) or Hydrochlorothiazide (Hydrodiuril) on the morning of surgery.  __x__ Follow recommendations from Cardiologist, Pulmonologist or PCP regarding stopping blood thinners such as Aspirin, Coumadin, Plavix, Eliquis, Effient, Pradaxa, and Pletal.  __x__ TODAY: Stop Anti-inflammatories such as Advil, Ibuprofen, Motrin, Aleve, Naproxen, Naprosyn, BC/Goodies powders or aspirin products. You may continue to take Tylenol and Celebrex.   __x__ TODAY: Stop supplements until after surgery. You may continue to take Vitamin D, Vitamin B, and multivitamin.

## 2018-07-15 NOTE — Pre-Procedure Instructions (Signed)
REQUEST FOR NEURO CLEARANCE CALLED AND FAXED TO DR [facility].

## 2018-07-16 ENCOUNTER — Ambulatory Visit: Payer: Medicare HMO | Admitting: Cardiovascular Disease

## 2018-07-16 ENCOUNTER — Encounter

## 2018-07-17 MED ORDER — CEFAZOLIN SODIUM-DEXTROSE 2-4 GM/100ML-% IV SOLN
2.0000 g | Freq: Once | INTRAVENOUS | Status: AC
  Administered 2018-07-18: 2 g via INTRAVENOUS

## 2018-07-18 ENCOUNTER — Other Ambulatory Visit: Payer: Self-pay

## 2018-07-18 ENCOUNTER — Encounter: Payer: Self-pay | Admitting: *Deleted

## 2018-07-18 ENCOUNTER — Ambulatory Visit: Payer: Medicare HMO | Admitting: Certified Registered"

## 2018-07-18 ENCOUNTER — Encounter
Admission: RE | Disposition: A | Discharge status: Home / Self Care | Payer: Self-pay | Source: Ambulatory Visit | Attending: Surgery

## 2018-07-18 ENCOUNTER — Ambulatory Visit
Admission: RE | Admit: 2018-07-18 | Discharge: 2018-07-18 | Disposition: A | Discharge status: Home / Self Care | Payer: Medicare HMO | Source: Ambulatory Visit | Attending: Surgery | Admitting: Surgery

## 2018-07-18 DIAGNOSIS — Z87891 Personal history of nicotine dependence: Secondary | ICD-10-CM | POA: Insufficient documentation

## 2018-07-18 DIAGNOSIS — E7800 Pure hypercholesterolemia, unspecified: Secondary | ICD-10-CM | POA: Diagnosis not present

## 2018-07-18 DIAGNOSIS — I252 Old myocardial infarction: Secondary | ICD-10-CM | POA: Insufficient documentation

## 2018-07-18 DIAGNOSIS — M170 Bilateral primary osteoarthritis of knee: Secondary | ICD-10-CM | POA: Insufficient documentation

## 2018-07-18 DIAGNOSIS — S46211A Strain of muscle, fascia and tendon of other parts of biceps, right arm, initial encounter: Secondary | ICD-10-CM | POA: Diagnosis not present

## 2018-07-18 DIAGNOSIS — M19042 Primary osteoarthritis, left hand: Secondary | ICD-10-CM | POA: Insufficient documentation

## 2018-07-18 DIAGNOSIS — E785 Hyperlipidemia, unspecified: Secondary | ICD-10-CM | POA: Diagnosis not present

## 2018-07-18 DIAGNOSIS — S46011A Strain of muscle(s) and tendon(s) of the rotator cuff of right shoulder, initial encounter: Secondary | ICD-10-CM | POA: Insufficient documentation

## 2018-07-18 DIAGNOSIS — I2510 Atherosclerotic heart disease of native coronary artery without angina pectoris: Secondary | ICD-10-CM | POA: Diagnosis not present

## 2018-07-18 DIAGNOSIS — M7581 Other shoulder lesions, right shoulder: Secondary | ICD-10-CM | POA: Diagnosis not present

## 2018-07-18 DIAGNOSIS — M19041 Primary osteoarthritis, right hand: Secondary | ICD-10-CM | POA: Insufficient documentation

## 2018-07-18 DIAGNOSIS — X58XXXA Exposure to other specified factors, initial encounter: Secondary | ICD-10-CM | POA: Diagnosis not present

## 2018-07-18 DIAGNOSIS — Z79899 Other long term (current) drug therapy: Secondary | ICD-10-CM | POA: Insufficient documentation

## 2018-07-18 DIAGNOSIS — M21821 Other specified acquired deformities of right upper arm: Secondary | ICD-10-CM | POA: Diagnosis not present

## 2018-07-18 DIAGNOSIS — G8918 Other acute postprocedural pain: Secondary | ICD-10-CM | POA: Diagnosis not present

## 2018-07-18 DIAGNOSIS — S43421A Sprain of right rotator cuff capsule, initial encounter: Secondary | ICD-10-CM | POA: Diagnosis not present

## 2018-07-18 DIAGNOSIS — Z7982 Long term (current) use of aspirin: Secondary | ICD-10-CM | POA: Insufficient documentation

## 2018-07-18 DIAGNOSIS — S46101A Unspecified injury of muscle, fascia and tendon of long head of biceps, right arm, initial encounter: Secondary | ICD-10-CM | POA: Diagnosis not present

## 2018-07-18 DIAGNOSIS — M75121 Complete rotator cuff tear or rupture of right shoulder, not specified as traumatic: Secondary | ICD-10-CM | POA: Diagnosis not present

## 2018-07-18 DIAGNOSIS — I10 Essential (primary) hypertension: Secondary | ICD-10-CM | POA: Insufficient documentation

## 2018-07-18 DIAGNOSIS — R569 Unspecified convulsions: Secondary | ICD-10-CM | POA: Diagnosis not present

## 2018-07-18 DIAGNOSIS — Z7902 Long term (current) use of antithrombotics/antiplatelets: Secondary | ICD-10-CM | POA: Insufficient documentation

## 2018-07-18 DIAGNOSIS — M25511 Pain in right shoulder: Secondary | ICD-10-CM | POA: Diagnosis not present

## 2018-07-18 HISTORY — PX: SHOULDER ARTHROSCOPY WITH OPEN ROTATOR CUFF REPAIR: SHX6092

## 2018-07-18 HISTORY — PX: BICEPT TENODESIS: SHX5116

## 2018-07-18 SURGERY — ARTHROSCOPY, SHOULDER WITH REPAIR, ROTATOR CUFF, OPEN
Anesthesia: General | Site: Shoulder | Laterality: Right

## 2018-07-18 MED ORDER — METOCLOPRAMIDE HCL 5 MG/ML IJ SOLN: 5.0000 mg | Freq: Three times a day (TID) | INTRAMUSCULAR | Status: DC | PRN

## 2018-07-18 MED ORDER — BUPIVACAINE HCL (PF) 0.5 % IJ SOLN
INTRAMUSCULAR | Status: AC
  Filled 2018-07-18: qty 10

## 2018-07-18 MED ORDER — CEFAZOLIN SODIUM-DEXTROSE 2-4 GM/100ML-% IV SOLN
INTRAVENOUS | Status: AC
  Filled 2018-07-18: qty 100

## 2018-07-18 MED ORDER — LIDOCAINE HCL (PF) 1 % IJ SOLN
INTRAMUSCULAR | Status: DC | PRN
  Administered 2018-07-18: 3 mL

## 2018-07-18 MED ORDER — POTASSIUM CHLORIDE IN NACL 20-0.9 MEQ/L-% IV SOLN
INTRAVENOUS | Status: DC
  Filled 2018-07-18 (×3): qty 1000

## 2018-07-18 MED ORDER — FENTANYL CITRATE (PF) 100 MCG/2ML IJ SOLN: 25.0000 ug | INTRAMUSCULAR | Status: DC | PRN

## 2018-07-18 MED ORDER — OXYCODONE HCL 5 MG/5ML PO SOLN: 5.0000 mg | Freq: Once | ORAL | Status: DC | PRN

## 2018-07-18 MED ORDER — OXYCODONE HCL 5 MG PO TABS: 5.0000 mg | ORAL_TABLET | ORAL | Status: DC | PRN

## 2018-07-18 MED ORDER — ROCURONIUM BROMIDE 100 MG/10ML IV SOLN: INTRAVENOUS | Status: DC | PRN

## 2018-07-18 MED ORDER — LIDOCAINE HCL (PF) 1 % IJ SOLN
INTRAMUSCULAR | Status: AC
  Filled 2018-07-18: qty 5

## 2018-07-18 MED ORDER — PROPOFOL 10 MG/ML IV BOLUS: INTRAVENOUS | Status: DC | PRN

## 2018-07-18 MED ORDER — FENTANYL CITRATE (PF) 100 MCG/2ML IJ SOLN
50.0000 ug | Freq: Once | INTRAMUSCULAR | Status: AC
  Administered 2018-07-18: 50 ug via INTRAVENOUS

## 2018-07-18 MED ORDER — BUPIVACAINE-EPINEPHRINE 0.5% -1:200000 IJ SOLN
INTRAMUSCULAR | Status: DC | PRN
  Administered 2018-07-18: 30 mL

## 2018-07-18 MED ORDER — LIDOCAINE HCL (CARDIAC) PF 100 MG/5ML IV SOSY
PREFILLED_SYRINGE | INTRAVENOUS | Status: DC | PRN
  Administered 2018-07-18: 100 mg via INTRAVENOUS

## 2018-07-18 MED ORDER — METOCLOPRAMIDE HCL 10 MG PO TABS: 5.0000 mg | ORAL_TABLET | Freq: Three times a day (TID) | ORAL | Status: DC | PRN

## 2018-07-18 MED ORDER — MIDAZOLAM HCL 2 MG/2ML IJ SOLN
1.0000 mg | Freq: Once | INTRAMUSCULAR | Status: AC
  Administered 2018-07-18: 1 mg via INTRAVENOUS

## 2018-07-18 MED ORDER — PROPOFOL 10 MG/ML IV BOLUS
INTRAVENOUS | Status: AC
  Filled 2018-07-18: qty 40

## 2018-07-18 MED ORDER — MIDAZOLAM HCL 2 MG/2ML IJ SOLN
INTRAMUSCULAR | Status: AC
  Administered 2018-07-18: 1 mg via INTRAVENOUS
  Filled 2018-07-18: qty 2

## 2018-07-18 MED ORDER — GLYCOPYRROLATE 0.2 MG/ML IJ SOLN
INTRAMUSCULAR | Status: DC | PRN
  Administered 2018-07-18: 0.2 mg via INTRAVENOUS

## 2018-07-18 MED ORDER — PROMETHAZINE HCL 25 MG/ML IJ SOLN: 6.2500 mg | INTRAMUSCULAR | Status: DC | PRN

## 2018-07-18 MED ORDER — BUPIVACAINE LIPOSOME 1.3 % IJ SUSP
INTRAMUSCULAR | Status: DC | PRN
  Administered 2018-07-18: 20 mL via PERINEURAL

## 2018-07-18 MED ORDER — LACTATED RINGERS IV SOLN
INTRAVENOUS | Status: DC | PRN
  Administered 2018-07-18: 1 mL

## 2018-07-18 MED ORDER — OXYCODONE HCL 5 MG PO TABS: 5.0000 mg | ORAL_TABLET | Freq: Once | ORAL | Status: DC | PRN

## 2018-07-18 MED ORDER — BUPIVACAINE LIPOSOME 1.3 % IJ SUSP
INTRAMUSCULAR | Status: AC
  Filled 2018-07-18: qty 20

## 2018-07-18 MED ORDER — ONDANSETRON HCL 4 MG/2ML IJ SOLN
INTRAMUSCULAR | Status: DC | PRN
  Administered 2018-07-18: 4 mg via INTRAVENOUS

## 2018-07-18 MED ORDER — LACTATED RINGERS IV SOLN
INTRAVENOUS | Status: DC
  Administered 2018-07-18 (×2): via INTRAVENOUS

## 2018-07-18 MED ORDER — OXYCODONE HCL 5 MG PO TABS: 5.0000 mg | ORAL_TABLET | ORAL | 0 refills | Status: DC | PRN

## 2018-07-18 MED ORDER — ONDANSETRON HCL 4 MG PO TABS: 4.0000 mg | ORAL_TABLET | Freq: Four times a day (QID) | ORAL | Status: DC | PRN

## 2018-07-18 MED ORDER — NITROGLYCERIN IN D5W 200-5 MCG/ML-% IV SOLN
INTRAVENOUS | Status: AC
  Filled 2018-07-18: qty 250

## 2018-07-18 MED ORDER — EPINEPHRINE PF 1 MG/ML IJ SOLN
INTRAMUSCULAR | Status: AC
  Filled 2018-07-18: qty 2

## 2018-07-18 MED ORDER — FAMOTIDINE 20 MG PO TABS
ORAL_TABLET | ORAL | Status: AC
  Administered 2018-07-18: 20 mg via ORAL
  Filled 2018-07-18: qty 1

## 2018-07-18 MED ORDER — PHENYLEPHRINE HCL 10 MG/ML IJ SOLN
INTRAMUSCULAR | Status: DC | PRN
  Administered 2018-07-18 (×2): 100 ug via INTRAVENOUS
  Administered 2018-07-18: 40 ug via INTRAVENOUS
  Administered 2018-07-18: 100 ug via INTRAVENOUS
  Administered 2018-07-18 (×2): 40 ug via INTRAVENOUS
  Administered 2018-07-18 (×2): 100 ug via INTRAVENOUS
  Administered 2018-07-18: 80 ug via INTRAVENOUS
  Administered 2018-07-18 (×2): 100 ug via INTRAVENOUS
  Administered 2018-07-18: 50 ug via INTRAVENOUS

## 2018-07-18 MED ORDER — DEXAMETHASONE SODIUM PHOSPHATE 10 MG/ML IJ SOLN
INTRAMUSCULAR | Status: DC | PRN
  Administered 2018-07-18: 4 mg via INTRAVENOUS

## 2018-07-18 MED ORDER — BUPIVACAINE HCL (PF) 0.5 % IJ SOLN
INTRAMUSCULAR | Status: DC | PRN
  Administered 2018-07-18: 10 mL via PERINEURAL

## 2018-07-18 MED ORDER — ROCURONIUM BROMIDE 100 MG/10ML IV SOLN
INTRAVENOUS | Status: DC | PRN
  Administered 2018-07-18 (×2): 5 mg via INTRAVENOUS
  Administered 2018-07-18: 50 mg via INTRAVENOUS

## 2018-07-18 MED ORDER — FENTANYL CITRATE (PF) 100 MCG/2ML IJ SOLN
INTRAMUSCULAR | Status: AC
  Filled 2018-07-18: qty 2

## 2018-07-18 MED ORDER — ONDANSETRON HCL 4 MG/2ML IJ SOLN: 4.0000 mg | Freq: Four times a day (QID) | INTRAMUSCULAR | Status: DC | PRN

## 2018-07-18 MED ORDER — BUPIVACAINE-EPINEPHRINE (PF) 0.5% -1:200000 IJ SOLN
INTRAMUSCULAR | Status: AC
  Filled 2018-07-18: qty 30

## 2018-07-18 MED ORDER — FENTANYL CITRATE (PF) 100 MCG/2ML IJ SOLN
INTRAMUSCULAR | Status: AC
  Administered 2018-07-18: 50 ug via INTRAVENOUS
  Filled 2018-07-18: qty 2

## 2018-07-18 MED ORDER — SUGAMMADEX SODIUM 200 MG/2ML IV SOLN
INTRAVENOUS | Status: DC | PRN
  Administered 2018-07-18: 200 mg via INTRAVENOUS

## 2018-07-18 MED ORDER — PROPOFOL 10 MG/ML IV BOLUS
INTRAVENOUS | Status: DC | PRN
  Administered 2018-07-18: 120 mg via INTRAVENOUS
  Administered 2018-07-18: 40 mg via INTRAVENOUS

## 2018-07-18 MED ORDER — SODIUM CHLORIDE 0.9 % IV SOLN
INTRAVENOUS | Status: DC | PRN
  Administered 2018-07-18: 20 ug/min via INTRAVENOUS

## 2018-07-18 MED ORDER — EPHEDRINE SULFATE 50 MG/ML IJ SOLN
INTRAMUSCULAR | Status: DC | PRN
  Administered 2018-07-18: 10 mg via INTRAVENOUS
  Administered 2018-07-18 (×3): 5 mg via INTRAVENOUS
  Administered 2018-07-18 (×2): 10 mg via INTRAVENOUS

## 2018-07-18 MED ORDER — FAMOTIDINE 20 MG PO TABS
20.0000 mg | ORAL_TABLET | Freq: Once | ORAL | Status: AC
  Administered 2018-07-18: 20 mg via ORAL

## 2018-07-18 MED ORDER — MEPERIDINE HCL 50 MG/ML IJ SOLN: 6.2500 mg | INTRAMUSCULAR | Status: DC | PRN

## 2018-07-18 SURGICAL SUPPLY — 51 items
ANCH SUT 2 2.9 2 LD TPR NDL (Anchor) ×5 IMPLANT
ANCH SUT BN ASCP DLV (Anchor) ×1 IMPLANT
ANCH SUT KNTLS STRL SHLDR SYS (Anchor) ×3 IMPLANT
ANCH SUT RGNRT REGENETEN (Staple) ×1 IMPLANT
ANCHOR BONE REGENETEN (Anchor) ×2 IMPLANT
ANCHOR JUGGERKNOT WTAP NDL 2.9 (Anchor) ×10 IMPLANT
ANCHOR SUT QUATTRO KNTLS 4.5 (Anchor) ×6 IMPLANT
ANCHOR TENDON REGENETEN (Staple) ×2 IMPLANT
BIT DRILL JUGRKNT W/NDL BIT2.9 (DRILL) ×1 IMPLANT
BLADE FULL RADIUS 3.5 (BLADE) ×2 IMPLANT
BUR ACROMIONIZER 4.0 (BURR) ×2 IMPLANT
CANNULA SHAVER 8MMX76MM (CANNULA) ×2 IMPLANT
CHLORAPREP W/TINT 26ML (MISCELLANEOUS) ×2 IMPLANT
COVER MAYO STAND STRL (DRAPES) ×2 IMPLANT
COVER WAND RF STERILE (DRAPES) ×2 IMPLANT
DRAPE IMP U-DRAPE 54X76 (DRAPES) ×4 IMPLANT
DRILL JUGGERKNOT W/NDL BIT 2.9 (DRILL) ×2
ELECT REM PT RETURN 9FT ADLT (ELECTROSURGICAL) ×2
ELECTRODE REM PT RTRN 9FT ADLT (ELECTROSURGICAL) ×1 IMPLANT
GAUZE PETRO XEROFOAM 1X8 (MISCELLANEOUS) ×2 IMPLANT
GAUZE SPONGE 4X4 12PLY STRL (GAUZE/BANDAGES/DRESSINGS) ×2 IMPLANT
GLOVE BIO SURGEON STRL SZ7.5 (GLOVE) ×4 IMPLANT
GLOVE BIO SURGEON STRL SZ8 (GLOVE) ×4 IMPLANT
GLOVE BIOGEL PI IND STRL 8 (GLOVE) ×1 IMPLANT
GLOVE BIOGEL PI INDICATOR 8 (GLOVE) ×1
GLOVE INDICATOR 8.0 STRL GRN (GLOVE) ×2 IMPLANT
GOWN STRL REUS W/ TWL LRG LVL3 (GOWN DISPOSABLE) ×1 IMPLANT
GOWN STRL REUS W/ TWL XL LVL3 (GOWN DISPOSABLE) ×1 IMPLANT
GOWN STRL REUS W/TWL LRG LVL3 (GOWN DISPOSABLE) ×2
GOWN STRL REUS W/TWL XL LVL3 (GOWN DISPOSABLE) ×2
GRASPER SUT 15 45D LOW PRO (SUTURE) IMPLANT
IMPL REGENETEN MEDIUM (Shoulder) ×1 IMPLANT
IMPLANT REGENETEN MEDIUM (Shoulder) ×2 IMPLANT
IV LACTATED RINGER IRRG 3000ML (IV SOLUTION) ×4
IV LR IRRIG 3000ML ARTHROMATIC (IV SOLUTION) ×2 IMPLANT
MANIFOLD NEPTUNE II (INSTRUMENTS) ×2 IMPLANT
MASK FACE SPIDER DISP (MASK) ×2 IMPLANT
MAT ABSORB  FLUID 56X50 GRAY (MISCELLANEOUS) ×1
MAT ABSORB FLUID 56X50 GRAY (MISCELLANEOUS) ×1 IMPLANT
PACK ARTHROSCOPY SHOULDER (MISCELLANEOUS) ×2 IMPLANT
SLING ARM LRG DEEP (SOFTGOODS) IMPLANT
SLING ULTRA II LG (MISCELLANEOUS) ×2 IMPLANT
STAPLER SKIN PROX 35W (STAPLE) ×2 IMPLANT
STRAP SAFETY 5IN WIDE (MISCELLANEOUS) ×2 IMPLANT
SUT ETHIBOND 0 MO6 C/R (SUTURE) ×2 IMPLANT
SUT VIC AB 2-0 CT1 27 (SUTURE) ×4
SUT VIC AB 2-0 CT1 TAPERPNT 27 (SUTURE) ×2 IMPLANT
TAPE MICROFOAM 4IN (TAPE) ×2 IMPLANT
TUBING ARTHRO INFLOW-ONLY STRL (TUBING) ×2 IMPLANT
TUBING CONNECTING 10 (TUBING) ×2 IMPLANT
WAND HAND CNTRL MULTIVAC 90 (MISCELLANEOUS) ×2 IMPLANT

## 2018-07-18 NOTE — OR Nursing (Signed)
Discharge instructions discussed with pt and wife. Both voice understanding. 

## 2018-07-18 NOTE — Op Note (Signed)
07/18/2018  10:30 AM  Patient:   George Doyle  Pre-Op Diagnosis:   Massive traumatic rotator cuff tear with biceps tendinopathy, right shoulder.  Post-Op Diagnosis:   Same.  Procedure:   Limited arthroscopic debridement, arthroscopic subacromial decompression, mini-open rotator cuff repair with a [employer]hew Regeneten and patch, and mini-open biceps tenodesis, right shoulder.  Anesthesia:   General endotracheal with interscalene block placed preoperatively by the anesthesiologist.  Surgeon:   Pascal Lux, MD  Assistant:   Cameron Proud, PA-C; Phoebe Sharps, PA-S  Findings:   As above. The rotator cuff tear involve the superior two thirds of the subscapularis tendon, the entire supraspinatus, and the anterior two thirds of the infraspinatus tendon. The biceps tendon demonstrated significant tendinopathic changes with extensive partial-thickness tearing, and was noted to be subluxed medially into the subscapularis tendon. The articular surfaces of the glenoid and humerus both were in satisfactory condition, other than a non-engaging anterior Hill-Sachs lesion. The labrum was in satisfactory condition with minimal fraying noted superiorly.  Complications:   None  Fluids:   1000 cc  Estimated blood loss:   15 cc  Tourniquet time:   None  Drains:   None  Closure:   Staples      Brief clinical note:   The patient is a 67 year old male who apparently injured his right shoulder as the result of a seizure in July, 2019. Initial treatment was nonsurgical, but apparently the patient reaggravated his shoulder 6 to 8 weeks ago. [name] symptoms have progressed despite medications, activity modification, etc. [name] history and examination are consistent with impingement/tendinopathy with a rotator cuff tear. These findings were confirmed by MRI scan. The patient presents at this time for definitive management of these shoulder symptoms.  Procedure:   The patient  underwent placement of an interscalene block by the anesthesiologist in the preoperative holding area before being brought into the operating room and lain in the supine position. The patient then underwent general endotracheal intubation and anesthesia before being repositioned in the beach chair position using the beach chair positioner. The right shoulder and upper extremity were prepped with ChloraPrep solution before being draped sterilely. Preoperative antibiotics were administered. A timeout was performed to confirm the proper surgical site before the expected portal sites and incision site were injected with 0.5% Sensorcaine with epinephrine. A posterior portal was created and the glenohumeral joint thoroughly inspected with the findings as described above. An anterior portal was created using an outside-in technique. The labrum and rotator cuff were further probed, again confirming the above-noted findings. Areas of synovitis as well as the torn margins of the rotator cuff tears were debrided back to stable margins using the full-radius resector. The ArthroCare wand was inserted and used to release the biceps tendon from its labral anchor. It also was used to obtain hemostasis as well as to "anneal" the labrum superiorly and anteriorly. The instruments were removed from the joint after suctioning the excess fluid.  The camera was repositioned through the posterior portal into the subacromial space. A separate lateral portal was created using an outside-in technique. The 3.5 mm full-radius resector was introduced and used to perform a subtotal bursectomy. The ArthroCare wand was then inserted and used to remove the periosteal tissue off the undersurface of the anterior third of the acromion as well as to recess the coracoacromial ligament from its attachment along the anterior and lateral margins of the acromion. The 4.0 mm acromionizing bur was introduced and used to  complete the decompression by removing  the undersurface of the anterior third of the acromion. The full radius resector was reintroduced to remove any residual bony debris before the ArthroCare wand was reintroduced to obtain hemostasis. The instruments were then removed from the subacromial space after suctioning the excess fluid.  An approximately 4-5 cm incision was made over the anterolateral aspect of the shoulder beginning at the anterolateral corner of the acromion and extending distally in line with the bicipital groove. This incision was carried down through the subcutaneous tissues to expose the deltoid fascia. The raphae between the anterior and middle thirds was identified and this plane developed to provide access into the subacromial space. Additional bursal tissues were debrided sharply using Metzenbaum scissors. The rotator cuff tear was readily identified. The margins were debrided sharply with a #15 blade and the exposed greater tuberosity roughened with a rongeur. The tear was repaired using four Biomet 2.9 mm JuggerKnot anchors. These sutures were then brought back laterally and secured using three [place] QuatroLink anchors to create a two-layer closure. In addition, several #0 Ethibond interrupted sutures were placed in a side-to-side fashion to close the longitudinal portion of the tear. Finally, a medium-sized [place] patch was applied over the lateral junction site in order to try to optimize healing of this repair. An apparent watertight closure was obtained.  The bicipital groove was identified by palpation and opened for 1-1.5 cm. The biceps tendon stump was retrieved through this defect. The floor of the bicipital groove was roughened with a curet before another Biomet 2.9 mm JuggerKnot anchor was inserted. Both sets of sutures were passed through the biceps tendon and tied securely to effect the tenodesis. The bicipital sheath was reapproximated using two #0 Ethibond interrupted sutures, incorporating  the biceps tendon to further reinforce the tenodesis.  The wound was copiously irrigated with sterile saline solution before the deltoid raphae was reapproximated using 2-0 Vicryl interrupted sutures. The subcutaneous tissues were closed in two layers using 2-0 Vicryl interrupted sutures before the skin was closed using staples. The portal sites also were closed using staples. A sterile bulky dressing was applied to the shoulder before the arm was placed into a shoulder immobilizer. The patient was then awakened, extubated, and returned to the recovery room in satisfactory condition after tolerating the procedure well.

## 2018-07-18 NOTE — Anesthesia Preprocedure Evaluation (Signed)
Anesthesia Evaluation  Patient identified by MRN, date of birth, ID band Patient awake    Reviewed: Allergy & Precautions, NPO status , Patient's Chart, lab work & pertinent test results  History of Anesthesia Complications Negative for: history of anesthetic complications  Airway Mallampati: III  TM Distance: >3 FB Neck ROM: Full    Dental no notable dental hx.    Pulmonary neg sleep apnea, neg COPD, former smoker,    breath sounds clear to auscultation- rhonchi (-) wheezing      Cardiovascular hypertension, + CAD and + Past MI (medically managed)  (-) Cardiac Stents and (-) CABG  Rhythm:Regular Rate:Normal - Systolic murmurs and - Diastolic murmurs    Neuro/Psych Seizures -, Well Controlled,  negative psych ROS   GI/Hepatic negative GI ROS, Neg liver ROS,   Endo/Other  negative endocrine ROSneg diabetes  Renal/GU negative Renal ROS     Musculoskeletal  (+) Arthritis ,   Abdominal (+) - obese,   Peds  Hematology negative hematology ROS (+)   Anesthesia Other Findings Past Medical History: No date: Allergy No date: Arthritis     Comment:  knees, hands No date: Hypertension 07/2017: Myocardial infarction [facility]) No date: Pneumonia No date: Seizures [facility])     Comment:  approx 1970's, 2005, June 2019  Testing showed no cause.              Extended EEG scheduled for November 2019   Reproductive/Obstetrics                             Anesthesia Physical Anesthesia Plan  ASA: III  Anesthesia Plan: General   Post-op Pain Management:  Regional for Post-op pain   Induction: Intravenous  PONV Risk Score and Plan: 1 and Ondansetron and Midazolam  Airway Management Planned: Oral ETT  Additional Equipment:   Intra-op Plan:   Post-operative Plan: Extubation in OR  Informed Consent: I have reviewed the patients History and Physical, chart, labs and discussed the procedure including  the risks, benefits and alternatives for the proposed anesthesia with the patient or authorized representative who has indicated his/her understanding and acceptance.   Dental advisory given  Plan Discussed with: CRNA and Anesthesiologist  Anesthesia Plan Comments:         Anesthesia Quick Evaluation

## 2018-07-18 NOTE — Discharge Instructions (Signed)
Orthopedic discharge instructions: Keep dressing dry and intact.  May shower after dressing changed on post-op day #4 (Monday).  Cover staples with Band-Aids after drying off. Apply ice frequently to shoulder. Take oxycodone as prescribed when needed.  May supplement with ES Tylenol if necessary. Keep shoulder immobilizer on at all times except may remove for bathing purposes. Follow-up in 10-14 days or as scheduled.

## 2018-07-18 NOTE — Anesthesia Procedure Notes (Signed)
Procedure Name: Intubation Date/Time: 07/18/2018 8:34 AM Performed by: Adalberto Ill, CRNA Pre-anesthesia Checklist: Patient identified, Emergency Drugs available, Suction available, Patient being monitored and Timeout performed Patient Re-evaluated:Patient Re-evaluated prior to induction Oxygen Delivery Method: Circle system utilized Preoxygenation: Pre-oxygenation with 100% oxygen Induction Type: IV induction Ventilation: Mask ventilation without difficulty Laryngoscope Size: Mac, McGraph and 3 Grade View: Grade I Tube type: Oral Tube size: 7.5 mm Number of attempts: 1 Airway Equipment and Method: Stylet and Video-laryngoscopy Placement Confirmation: ETT inserted through vocal cords under direct vision,  positive ETCO2 and breath sounds checked- equal and bilateral (minimal occlusive pressure ) Secured at: 23 cm Tube secured with: Tape Dental Injury: Teeth and Oropharynx as per pre-operative assessment

## 2018-07-18 NOTE — Anesthesia Postprocedure Evaluation (Signed)
Anesthesia Post Note  Patient: George Doyle  Procedure(s) Performed: SHOULDER ARTHROSCOPY WITH OPEN ROTATOR CUFF REPAIR (Right Shoulder) BICEPS TENODESIS (Right Shoulder)  Patient location during evaluation: PACU Anesthesia Type: General Level of consciousness: awake and alert and oriented Pain management: pain level controlled Vital Signs Assessment: post-procedure vital signs reviewed and stable Respiratory status: spontaneous breathing, nonlabored ventilation and respiratory function stable Cardiovascular status: blood pressure returned to baseline and stable Postop Assessment: no signs of nausea or vomiting Anesthetic complications: no     Last Vitals:  Vitals:   07/18/18 1217 07/18/18 1230  BP: 128/65 117/63  Pulse: (!) 53 (!) 52  Resp: 16 16  Temp: (!) 35.9 C (!) 35.8 C  SpO2: 95% 94%    Last Pain:  Vitals:   07/18/18 1230  TempSrc:   PainSc: 0-No pain                 Raimundo Corbit

## 2018-07-18 NOTE — Anesthesia Procedure Notes (Signed)
Anesthesia Regional Block: Interscalene brachial plexus block   Pre-Anesthetic Checklist: ,, timeout performed, Correct Patient, Correct Site, Correct Laterality, Correct Procedure, Correct Position, site marked, Risks and benefits discussed,  Surgical consent,  Pre-op evaluation,  At surgeon's request and post-op pain management  Laterality: Right  Prep: chloraprep       Needles:  Injection technique: Single-shot  Needle Type: Stimiplex     Needle Length: 10cm  Needle Gauge: 21     Additional Needles:   Procedures:,,,, ultrasound used (permanent image in chart),,,,  Narrative:  Start time: 07/18/2018 8:08 AM End time: 07/18/2018 8:15 AM Injection made incrementally with aspirations every 5 mL.  Performed by: Personally  Anesthesiologist: Emmie Niemann, MD  Additional Notes: Functioning IV was confirmed and monitors were applied.  A Stimuplex needle was used. Sterile prep and drape,hand hygiene and sterile gloves were used.  Negative aspiration and negative test dose prior to incremental administration of local anesthetic. The patient tolerated the procedure well.

## 2018-07-18 NOTE — Transfer of Care (Signed)
Immediate Anesthesia Transfer of Care Note  Patient: George Doyle  Procedure(s) Performed: SHOULDER ARTHROSCOPY WITH OPEN ROTATOR CUFF REPAIR (Right Shoulder) BICEPS TENODESIS (Right Shoulder)  Patient Location: PACU  Anesthesia Type:GA combined with regional for post-op pain  Level of Consciousness: drowsy and patient cooperative  Airway & Oxygen Therapy: Patient Spontanous Breathing and Patient connected to nasal cannula oxygen  Post-op Assessment: Report given to RN and Post -op Vital signs reviewed and stable  Post vital signs: Reviewed and stable  Last Vitals:  Vitals Value Taken Time  BP 123/60 07/18/2018 10:52 AM  Temp    Pulse 60 07/18/2018 10:58 AM  Resp 15 07/18/2018 10:58 AM  SpO2 94 % 07/18/2018 10:58 AM  Vitals shown include unvalidated device data.  Last Pain:  Vitals:   07/18/18 1052  TempSrc:   PainSc: (P) Asleep         Complications: No apparent anesthesia complications

## 2018-07-18 NOTE — H&P (Signed)
Paper H&P to be scanned into permanent record. H&P reviewed and patient re-examined. No changes. 

## 2018-07-18 NOTE — Anesthesia Post-op Follow-up Note (Signed)
Anesthesia QCDR form completed.        

## 2018-07-19 ENCOUNTER — Encounter: Payer: Self-pay | Admitting: Surgery

## 2018-07-26 DIAGNOSIS — R569 Unspecified convulsions: Secondary | ICD-10-CM | POA: Diagnosis not present

## 2018-07-27 DIAGNOSIS — R569 Unspecified convulsions: Secondary | ICD-10-CM | POA: Diagnosis not present

## 2018-07-28 DIAGNOSIS — R569 Unspecified convulsions: Secondary | ICD-10-CM | POA: Diagnosis not present

## 2018-08-14 DIAGNOSIS — S46011D Strain of muscle(s) and tendon(s) of the rotator cuff of right shoulder, subsequent encounter: Secondary | ICD-10-CM | POA: Diagnosis not present

## 2018-08-14 DIAGNOSIS — M25611 Stiffness of right shoulder, not elsewhere classified: Secondary | ICD-10-CM | POA: Diagnosis not present

## 2018-08-14 DIAGNOSIS — M6281 Muscle weakness (generalized): Secondary | ICD-10-CM | POA: Diagnosis not present

## 2018-08-14 DIAGNOSIS — M25511 Pain in right shoulder: Secondary | ICD-10-CM | POA: Diagnosis not present

## 2018-08-21 DIAGNOSIS — S46011D Strain of muscle(s) and tendon(s) of the rotator cuff of right shoulder, subsequent encounter: Secondary | ICD-10-CM | POA: Diagnosis not present

## 2018-08-29 DIAGNOSIS — S46011D Strain of muscle(s) and tendon(s) of the rotator cuff of right shoulder, subsequent encounter: Secondary | ICD-10-CM | POA: Diagnosis not present

## 2018-09-04 DIAGNOSIS — S46011D Strain of muscle(s) and tendon(s) of the rotator cuff of right shoulder, subsequent encounter: Secondary | ICD-10-CM | POA: Diagnosis not present

## 2018-09-10 DIAGNOSIS — S46011D Strain of muscle(s) and tendon(s) of the rotator cuff of right shoulder, subsequent encounter: Secondary | ICD-10-CM | POA: Diagnosis not present

## 2018-09-17 DIAGNOSIS — S46011D Strain of muscle(s) and tendon(s) of the rotator cuff of right shoulder, subsequent encounter: Secondary | ICD-10-CM | POA: Diagnosis not present

## 2018-09-19 DIAGNOSIS — S46011D Strain of muscle(s) and tendon(s) of the rotator cuff of right shoulder, subsequent encounter: Secondary | ICD-10-CM | POA: Diagnosis not present

## 2018-09-23 DIAGNOSIS — S46011D Strain of muscle(s) and tendon(s) of the rotator cuff of right shoulder, subsequent encounter: Secondary | ICD-10-CM | POA: Diagnosis not present

## 2018-09-26 DIAGNOSIS — S46011D Strain of muscle(s) and tendon(s) of the rotator cuff of right shoulder, subsequent encounter: Secondary | ICD-10-CM | POA: Diagnosis not present

## 2018-10-01 DIAGNOSIS — S46011D Strain of muscle(s) and tendon(s) of the rotator cuff of right shoulder, subsequent encounter: Secondary | ICD-10-CM | POA: Diagnosis not present

## 2018-10-03 DIAGNOSIS — S46011D Strain of muscle(s) and tendon(s) of the rotator cuff of right shoulder, subsequent encounter: Secondary | ICD-10-CM | POA: Diagnosis not present

## 2018-10-08 DIAGNOSIS — S46011D Strain of muscle(s) and tendon(s) of the rotator cuff of right shoulder, subsequent encounter: Secondary | ICD-10-CM | POA: Diagnosis not present

## 2018-10-10 DIAGNOSIS — S46011D Strain of muscle(s) and tendon(s) of the rotator cuff of right shoulder, subsequent encounter: Secondary | ICD-10-CM | POA: Diagnosis not present

## 2018-10-15 DIAGNOSIS — S46011D Strain of muscle(s) and tendon(s) of the rotator cuff of right shoulder, subsequent encounter: Secondary | ICD-10-CM | POA: Diagnosis not present

## 2018-10-17 DIAGNOSIS — S46011D Strain of muscle(s) and tendon(s) of the rotator cuff of right shoulder, subsequent encounter: Secondary | ICD-10-CM | POA: Diagnosis not present

## 2018-10-22 DIAGNOSIS — S46011D Strain of muscle(s) and tendon(s) of the rotator cuff of right shoulder, subsequent encounter: Secondary | ICD-10-CM | POA: Diagnosis not present

## 2018-10-24 DIAGNOSIS — S46011D Strain of muscle(s) and tendon(s) of the rotator cuff of right shoulder, subsequent encounter: Secondary | ICD-10-CM | POA: Diagnosis not present

## 2018-10-29 DIAGNOSIS — S46011D Strain of muscle(s) and tendon(s) of the rotator cuff of right shoulder, subsequent encounter: Secondary | ICD-10-CM | POA: Diagnosis not present

## 2018-10-30 DIAGNOSIS — R69 Illness, unspecified: Secondary | ICD-10-CM | POA: Diagnosis not present

## 2018-10-31 DIAGNOSIS — S46011D Strain of muscle(s) and tendon(s) of the rotator cuff of right shoulder, subsequent encounter: Secondary | ICD-10-CM | POA: Diagnosis not present

## 2018-11-04 DIAGNOSIS — S46011D Strain of muscle(s) and tendon(s) of the rotator cuff of right shoulder, subsequent encounter: Secondary | ICD-10-CM | POA: Diagnosis not present

## 2018-11-07 DIAGNOSIS — S46011D Strain of muscle(s) and tendon(s) of the rotator cuff of right shoulder, subsequent encounter: Secondary | ICD-10-CM | POA: Diagnosis not present

## 2018-11-29 DIAGNOSIS — S46101D Unspecified injury of muscle, fascia and tendon of long head of biceps, right arm, subsequent encounter: Secondary | ICD-10-CM | POA: Diagnosis not present

## 2018-11-29 DIAGNOSIS — M7581 Other shoulder lesions, right shoulder: Secondary | ICD-10-CM | POA: Diagnosis not present

## 2018-11-29 DIAGNOSIS — M21821 Other specified acquired deformities of right upper arm: Secondary | ICD-10-CM | POA: Diagnosis not present

## 2018-11-29 DIAGNOSIS — S46011D Strain of muscle(s) and tendon(s) of the rotator cuff of right shoulder, subsequent encounter: Secondary | ICD-10-CM | POA: Diagnosis not present

## 2018-12-12 ENCOUNTER — Other Ambulatory Visit: Payer: Self-pay

## 2018-12-12 ENCOUNTER — Encounter: Payer: Self-pay | Admitting: Family Medicine

## 2018-12-12 ENCOUNTER — Ambulatory Visit: Payer: Medicare HMO | Admitting: Family Medicine

## 2018-12-12 VITALS — BP 124/70 | HR 60 | Ht 70.0 in | Wt 211.0 lb

## 2018-12-12 DIAGNOSIS — E785 Hyperlipidemia, unspecified: Secondary | ICD-10-CM | POA: Diagnosis not present

## 2018-12-12 DIAGNOSIS — I10 Essential (primary) hypertension: Secondary | ICD-10-CM | POA: Diagnosis not present

## 2018-12-12 DIAGNOSIS — I2510 Atherosclerotic heart disease of native coronary artery without angina pectoris: Secondary | ICD-10-CM

## 2018-12-12 DIAGNOSIS — R69 Illness, unspecified: Secondary | ICD-10-CM

## 2018-12-12 MED ORDER — METOPROLOL TARTRATE 50 MG PO TABS: 50.0000 mg | ORAL_TABLET | Freq: Two times a day (BID) | ORAL | 1 refills | Status: DC

## 2018-12-12 MED ORDER — ATORVASTATIN CALCIUM 80 MG PO TABS: 80.0000 mg | ORAL_TABLET | Freq: Every day | ORAL | 1 refills | Status: DC

## 2018-12-12 MED ORDER — CLOPIDOGREL BISULFATE 75 MG PO TABS: 75.0000 mg | ORAL_TABLET | Freq: Every day | ORAL | 1 refills | Status: DC

## 2018-12-12 MED ORDER — RAMIPRIL 10 MG PO CAPS: 10.0000 mg | ORAL_CAPSULE | Freq: Two times a day (BID) | ORAL | 1 refills | Status: DC

## 2018-12-12 NOTE — Progress Notes (Signed)
Date:  12/12/2018   Name:  George Doyle   DOB:  06/25/1951   MRN:  [id number]   Chief Complaint: Hypertension; Hyperlipidemia; and Coronary Artery Disease (takes plavix)  Hypertension  This is a chronic problem. The current episode started more than 1 year ago. The problem is unchanged. The problem is controlled. Pertinent negatives include no anxiety, blurred vision, chest pain, headaches, malaise/fatigue, neck pain, orthopnea, palpitations, peripheral edema, PND, shortness of breath or sweats. There are no associated agents to hypertension. There are no known risk factors for coronary artery disease. Past treatments include ACE inhibitors, beta blockers and diuretics. The current treatment provides moderate improvement. There are no compliance problems.  Hypertensive end-organ damage includes angina and CAD/MI. There is no history of kidney disease, CVA, heart failure, left ventricular hypertrophy, PVD or retinopathy. There is no history of chronic renal disease, a hypertension causing med or renovascular disease.  Hyperlipidemia  This is a chronic problem. The current episode started more than 1 year ago. The problem is controlled. Recent lipid tests were reviewed and are normal. He has no history of chronic renal disease, diabetes, hypothyroidism, liver disease, obesity or nephrotic syndrome. There are no known factors aggravating his hyperlipidemia. Pertinent negatives include no chest pain, focal sensory loss, focal weakness, leg pain, myalgias or shortness of breath. Current antihyperlipidemic treatment includes statins and diet change. The current treatment provides mild improvement of lipids. There are no compliance problems.  Risk factors for coronary artery disease include dyslipidemia, hypertension, post-menopausal and male sex.  Coronary Artery Disease  Presents for follow-up visit. Pertinent negatives include no chest pain, chest pressure, chest tightness, dizziness, leg  swelling, muscle weakness, palpitations, shortness of breath or weight gain. Risk factors include hyperlipidemia and hypertension. Risk factors do not include obesity. The symptoms have been stable. Compliance with diet is good. Compliance with exercise is good. Compliance with medications is good.    Review of Systems  Constitutional: Negative for chills, fever, malaise/fatigue and weight gain.  HENT: Negative for drooling, ear discharge, ear pain and sore throat.   Eyes: Negative for blurred vision.  Respiratory: Negative for cough, chest tightness, shortness of breath and wheezing.   Cardiovascular: Negative for chest pain, palpitations, orthopnea, leg swelling and PND.  Gastrointestinal: Negative for abdominal pain, blood in stool, constipation, diarrhea and nausea.  Endocrine: Negative for polydipsia.  Genitourinary: Negative for dysuria, frequency, hematuria and urgency.  Musculoskeletal: Negative for back pain, myalgias, muscle weakness and neck pain.  Skin: Negative for rash.  Allergic/Immunologic: Negative for environmental allergies.  Neurological: Negative for dizziness, focal weakness and headaches.  Hematological: Does not bruise/bleed easily.  Psychiatric/Behavioral: Negative for suicidal ideas. The patient is not nervous/anxious.     Patient Active Problem List   Diagnosis Date Noted  . Atherosclerosis of native coronary artery with stable angina pectoris ([place]) 06/21/2018  . Pure hypercholesterolemia 06/21/2018  . Poorly-controlled hypertension 06/21/2018  . Seizure disorder ([place]) 06/21/2018  . Old MI (myocardial infarction) 08/30/2017  . CAD, multiple vessel 08/01/2017  . NSTEMI (non-ST elevated myocardial infarction) ([place]) 07/20/2017  . Hyperlipidemia 05/10/2017  . Preop cardiovascular exam   . Benign neoplasm of ascending colon   . Polyp of sigmoid colon   . Essential hypertension 03/03/2016    No Known Allergies  Past Surgical History:  Procedure Laterality  Date  . BICEPT TENODESIS Right 07/18/2018   Procedure: BICEPS TENODESIS;  Surgeon: Corky Mull, MD;  Location: [facility];  Service: Orthopedics;  Laterality: Right;  .  COLONOSCOPY  2011   cleared for 10 yrs  . COLONOSCOPY WITH PROPOFOL N/A 11/06/2016   Procedure: COLONOSCOPY WITH PROPOFOL;  Surgeon: [name], MD;  Location: [place];  Service: Endoscopy;  Laterality: N/A;  . CORONARY STENT INTERVENTION N/A 07/23/2017   Procedure: CORONARY STENT INTERVENTION;  Surgeon: Yolonda Kida, MD;  Location: [place] [facility];  Service: Cardiovascular;  Laterality: N/A;  . HERNIA REPAIR    . KNEE SURGERY Bilateral    2 on R) 1 on L)  . LEFT HEART CATH AND CORONARY ANGIOGRAPHY N/A 07/23/2017   Procedure: LEFT HEART CATH AND CORONARY ANGIOGRAPHY;  Surgeon: Minna Merritts, MD;  Location: [place] [facility];  Service: Cardiovascular;  Laterality: N/A;  . POLYPECTOMY  11/06/2016   Procedure: POLYPECTOMY;  Surgeon: [name], MD;  Location: [place];  Service: Endoscopy;;  . SHOULDER ARTHROSCOPY WITH OPEN ROTATOR CUFF REPAIR Right 07/18/2018   Procedure: SHOULDER ARTHROSCOPY WITH OPEN ROTATOR CUFF REPAIR;  Surgeon: Corky Mull, MD;  Location: [facility];  Service: Orthopedics;  Laterality: Right;    Social History   Tobacco Use  . Smoking status: Former Research scientist (life sciences)  . Smokeless tobacco: Never Used  . Tobacco comment: quit over 20 yrs ago  Substance Use Topics  . Alcohol use: Yes    Alcohol/week: 0.0 standard drinks    Comment: 1-2 drinks/year  . Drug use: No     Medication list has been reviewed and updated.  Current Meds  Medication Sig  . aspirin EC 81 MG EC tablet Take 1 tablet (81 mg total) daily by mouth.  Marland Kitchen atorvastatin (LIPITOR) 80 MG tablet Take 1 tablet (80 mg total) by mouth daily at 6 PM.  . clopidogrel (PLAVIX) 75 MG tablet Take 1 tablet (75 mg total) by mouth daily.  . hydrochlorothiazide (HYDRODIURIL) 25 MG tablet Take 1 tablet (25 mg total) by  mouth daily.  . metoprolol tartrate (LOPRESSOR) 50 MG tablet Take 1 tablet (50 mg total) by mouth 2 (two) times daily.  . nitroGLYCERIN (NITROSTAT) 0.4 MG SL tablet Place 1 tablet (0.4 mg total) every 5 (five) minutes x 3 doses as needed under the tongue for chest pain.  . ramipril (ALTACE) 10 MG capsule Take 1 capsule (10 mg total) by mouth 2 (two) times daily.    PHQ 2/9 Scores 12/12/2018 06/07/2018 11/05/2017 09/24/2017  PHQ - 2 Score 0 2 1 1   PHQ- 9 Score 1 4 4 3     Physical Exam  Wt Readings from Last 3 Encounters:  12/12/18 211 lb (95.7 kg)  07/18/18 202 lb 9.6 oz (91.9 kg)  07/15/18 202 lb 11.2 oz (91.9 kg)    BP 124/70   Pulse 60   Ht 5\' 10"  (1.778 m)   Wt 211 lb (95.7 kg)   BMI 30.28 kg/m   Assessment and Plan: 1. Hyperlipidemia, unspecified hyperlipidemia type Chronic.  Controlled.  Continue atorvastatin 80 mg once a day and will check lipid panel today. - atorvastatin (LIPITOR) 80 MG tablet; Take 1 tablet (80 mg total) by mouth daily at 6 PM.  Dispense: 90 tablet; Refill: 1 - Lipid Panel With LDL/HDL Ratio  2. CAD, multiple vessel Followed by cardiology for coronary artery disease.  Will continue Plavix 75 mg once a day. - clopidogrel (PLAVIX) 75 MG tablet; Take 1 tablet (75 mg total) by mouth daily.  Dispense: 90 tablet; Refill: 1 - CBC w/Diff/Platelet  3. Essential hypertension Chronic.  Controlled.  Will check renal panel today.  Will continue metoprolol 50 mg 1 twice a day ramipril 10 mg once a day. - metoprolol tartrate (LOPRESSOR) 50 MG tablet; Take 1 tablet (50 mg total) by mouth 2 (two) times daily.  Dispense: 180 tablet; Refill: 1 - ramipril (ALTACE) 10 MG capsule; Take 1 capsule (10 mg total) by mouth 2 (two) times daily.  Dispense: 180 capsule; Refill: 1 - Renal Function Panel  4. Taking medication for chronic disease Patient on chronic medications that can cause side effects will check hepatic panel because patient is on statin and a CBC to await platelet  count for long-term Plavix. - Hepatic function panel - CBC w/Diff/Platelet

## 2018-12-13 LAB — CBC WITH DIFFERENTIAL/PLATELET
BASOS ABS: 0.1 10*3/uL (ref 0.0–0.2)
BASOS: 1 %
EOS (ABSOLUTE): 0.1 10*3/uL (ref 0.0–0.4)
Eos: 1 %
HEMOGLOBIN: 14.2 g/dL (ref 13.0–17.7)
Hematocrit: 41.8 % (ref 37.5–51.0)
IMMATURE GRANS (ABS): 0 10*3/uL (ref 0.0–0.1)
IMMATURE GRANULOCYTES: 0 %
LYMPHS: 24 %
Lymphocytes Absolute: 2.1 10*3/uL (ref 0.7–3.1)
MCH: 30.1 pg (ref 26.6–33.0)
MCHC: 34 g/dL (ref 31.5–35.7)
MCV: 89 fL (ref 79–97)
MONOCYTES: 8 %
Monocytes Absolute: 0.7 10*3/uL (ref 0.1–0.9)
NEUTROS ABS: 5.6 10*3/uL (ref 1.4–7.0)
NEUTROS PCT: 66 %
Platelets: 218 10*3/uL (ref 150–450)
RBC: 4.72 x10E6/uL (ref 4.14–5.80)
RDW: 12.7 % (ref 11.6–15.4)
WBC: 8.5 10*3/uL (ref 3.4–10.8)

## 2018-12-13 LAB — RENAL FUNCTION PANEL
ALBUMIN: 4.4 g/dL (ref 3.8–4.8)
BUN / CREAT RATIO: 13 (ref 10–24)
BUN: 15 mg/dL (ref 8–27)
CHLORIDE: 98 mmol/L (ref 96–106)
CO2: 26 mmol/L (ref 20–29)
Calcium: 9.5 mg/dL (ref 8.6–10.2)
Creatinine, Ser: 1.12 mg/dL (ref 0.76–1.27)
GFR calc non Af Amer: 67 mL/min/{1.73_m2} (ref >59)
GFR, EST AFRICAN AMERICAN: 77 mL/min/{1.73_m2} (ref >59)
GLUCOSE: 90 mg/dL (ref 65–99)
Phosphorus: 3.4 mg/dL (ref 2.8–4.1)
Potassium: 4 mmol/L (ref 3.5–5.2)
Sodium: 139 mmol/L (ref 134–144)

## 2018-12-13 LAB — LIPID PANEL WITH LDL/HDL RATIO
Cholesterol, Total: 125 mg/dL (ref 100–199)
HDL: 54 mg/dL (ref >39)
LDL CALC: 51 mg/dL (ref 0–99)
LDl/HDL Ratio: 0.9 ratio (ref 0.0–3.6)
Triglycerides: 99 mg/dL (ref 0–149)
VLDL CHOLESTEROL CAL: 20 mg/dL (ref 5–40)

## 2018-12-13 LAB — HEPATIC FUNCTION PANEL
ALT: 11 IU/L (ref 0–44)
AST: 21 IU/L (ref 0–40)
Alkaline Phosphatase: 95 IU/L (ref 39–117)
BILIRUBIN TOTAL: 0.5 mg/dL (ref 0.0–1.2)
Bilirubin, Direct: 0.13 mg/dL (ref 0.00–0.40)
TOTAL PROTEIN: 6.7 g/dL (ref 6.0–8.5)

## 2019-01-27 ENCOUNTER — Other Ambulatory Visit: Payer: Self-pay | Admitting: Surgery

## 2019-01-27 DIAGNOSIS — D2362 Other benign neoplasm of skin of left upper limb, including shoulder: Secondary | ICD-10-CM

## 2019-01-27 DIAGNOSIS — M21821 Other specified acquired deformities of right upper arm: Secondary | ICD-10-CM | POA: Diagnosis not present

## 2019-01-27 DIAGNOSIS — M7581 Other shoulder lesions, right shoulder: Secondary | ICD-10-CM | POA: Diagnosis not present

## 2019-01-27 DIAGNOSIS — S46101D Unspecified injury of muscle, fascia and tendon of long head of biceps, right arm, subsequent encounter: Secondary | ICD-10-CM | POA: Diagnosis not present

## 2019-01-27 DIAGNOSIS — S46011D Strain of muscle(s) and tendon(s) of the rotator cuff of right shoulder, subsequent encounter: Secondary | ICD-10-CM | POA: Diagnosis not present

## 2019-02-03 ENCOUNTER — Ambulatory Visit
Admission: RE | Admit: 2019-02-03 | Discharge: 2019-02-03 | Disposition: A | Discharge status: Home / Self Care | Payer: Medicare HMO | Source: Ambulatory Visit | Attending: Surgery | Admitting: Surgery

## 2019-02-03 ENCOUNTER — Other Ambulatory Visit: Payer: Self-pay

## 2019-02-03 DIAGNOSIS — D2362 Other benign neoplasm of skin of left upper limb, including shoulder: Secondary | ICD-10-CM | POA: Insufficient documentation

## 2019-02-03 DIAGNOSIS — R2232 Localized swelling, mass and lump, left upper limb: Secondary | ICD-10-CM | POA: Diagnosis not present

## 2019-02-03 DIAGNOSIS — S46011D Strain of muscle(s) and tendon(s) of the rotator cuff of right shoulder, subsequent encounter: Secondary | ICD-10-CM

## 2019-02-03 DIAGNOSIS — M7989 Other specified soft tissue disorders: Secondary | ICD-10-CM | POA: Diagnosis not present

## 2019-02-03 LAB — POCT I-STAT CREATININE: Creatinine, Ser: 1.1 mg/dL (ref 0.61–1.24)

## 2019-02-03 MED ORDER — GADOBUTROL 1 MMOL/ML IV SOLN
9.0000 mL | Freq: Once | INTRAVENOUS | Status: AC | PRN
  Administered 2019-02-03: 12:00:00 9 mL via INTRAVENOUS

## 2019-02-19 DIAGNOSIS — D2362 Other benign neoplasm of skin of left upper limb, including shoulder: Secondary | ICD-10-CM | POA: Diagnosis not present

## 2019-03-18 DIAGNOSIS — I10 Essential (primary) hypertension: Secondary | ICD-10-CM | POA: Diagnosis not present

## 2019-03-18 DIAGNOSIS — I2510 Atherosclerotic heart disease of native coronary artery without angina pectoris: Secondary | ICD-10-CM | POA: Diagnosis not present

## 2019-03-18 DIAGNOSIS — D2362 Other benign neoplasm of skin of left upper limb, including shoulder: Secondary | ICD-10-CM | POA: Diagnosis not present

## 2019-03-18 DIAGNOSIS — R55 Syncope and collapse: Secondary | ICD-10-CM | POA: Diagnosis not present

## 2019-03-18 DIAGNOSIS — R569 Unspecified convulsions: Secondary | ICD-10-CM | POA: Diagnosis not present

## 2019-03-18 DIAGNOSIS — Z87898 Personal history of other specified conditions: Secondary | ICD-10-CM | POA: Diagnosis not present

## 2019-03-29 DIAGNOSIS — Z1159 Encounter for screening for other viral diseases: Secondary | ICD-10-CM | POA: Diagnosis not present

## 2019-03-29 DIAGNOSIS — Z01818 Encounter for other preprocedural examination: Secondary | ICD-10-CM | POA: Diagnosis not present

## 2019-04-01 DIAGNOSIS — I10 Essential (primary) hypertension: Secondary | ICD-10-CM | POA: Diagnosis not present

## 2019-04-01 DIAGNOSIS — D2362 Other benign neoplasm of skin of left upper limb, including shoulder: Secondary | ICD-10-CM | POA: Diagnosis not present

## 2019-04-01 DIAGNOSIS — R200 Anesthesia of skin: Secondary | ICD-10-CM | POA: Diagnosis not present

## 2019-04-01 DIAGNOSIS — D3612 Benign neoplasm of peripheral nerves and autonomic nervous system, upper limb, including shoulder: Secondary | ICD-10-CM | POA: Diagnosis not present

## 2019-04-01 DIAGNOSIS — I2510 Atherosclerotic heart disease of native coronary artery without angina pectoris: Secondary | ICD-10-CM | POA: Diagnosis not present

## 2019-04-01 DIAGNOSIS — M79632 Pain in left forearm: Secondary | ICD-10-CM | POA: Diagnosis not present

## 2019-04-01 DIAGNOSIS — Z87891 Personal history of nicotine dependence: Secondary | ICD-10-CM | POA: Diagnosis not present

## 2019-05-20 DIAGNOSIS — R69 Illness, unspecified: Secondary | ICD-10-CM | POA: Diagnosis not present

## 2019-06-16 ENCOUNTER — Ambulatory Visit: Payer: Medicare HMO | Admitting: Family Medicine

## 2019-06-18 ENCOUNTER — Encounter: Payer: Self-pay | Admitting: Family Medicine

## 2019-06-18 ENCOUNTER — Ambulatory Visit: Payer: Medicare HMO | Admitting: Family Medicine

## 2019-06-18 ENCOUNTER — Other Ambulatory Visit: Payer: Self-pay

## 2019-06-18 VITALS — BP 124/80 | HR 72 | Ht 69.5 in | Wt 207.0 lb

## 2019-06-18 DIAGNOSIS — I2510 Atherosclerotic heart disease of native coronary artery without angina pectoris: Secondary | ICD-10-CM

## 2019-06-18 DIAGNOSIS — Z23 Encounter for immunization: Secondary | ICD-10-CM | POA: Diagnosis not present

## 2019-06-18 DIAGNOSIS — Z8679 Personal history of other diseases of the circulatory system: Secondary | ICD-10-CM

## 2019-06-18 DIAGNOSIS — R69 Illness, unspecified: Secondary | ICD-10-CM | POA: Diagnosis not present

## 2019-06-18 DIAGNOSIS — E785 Hyperlipidemia, unspecified: Secondary | ICD-10-CM

## 2019-06-18 DIAGNOSIS — I10 Essential (primary) hypertension: Secondary | ICD-10-CM

## 2019-06-18 MED ORDER — METOPROLOL TARTRATE 50 MG PO TABS: 50.0000 mg | ORAL_TABLET | Freq: Two times a day (BID) | ORAL | 1 refills | Status: DC

## 2019-06-18 MED ORDER — ATORVASTATIN CALCIUM 80 MG PO TABS: 80.0000 mg | ORAL_TABLET | Freq: Every day | ORAL | 1 refills | Status: DC

## 2019-06-18 MED ORDER — HYDROCHLOROTHIAZIDE 25 MG PO TABS: 25.0000 mg | ORAL_TABLET | Freq: Every day | ORAL | 1 refills | Status: DC

## 2019-06-18 MED ORDER — CLOPIDOGREL BISULFATE 75 MG PO TABS: 75.0000 mg | ORAL_TABLET | Freq: Every day | ORAL | 1 refills | Status: DC

## 2019-06-18 MED ORDER — RAMIPRIL 10 MG PO CAPS: 10.0000 mg | ORAL_CAPSULE | Freq: Two times a day (BID) | ORAL | 1 refills | Status: DC

## 2019-06-18 NOTE — Progress Notes (Signed)
Date:  06/18/2019   Name:  George Doyle   DOB:  1951/02/13   MRN:  [id number]   Chief Complaint: Hypertension, Hyperlipidemia, myocardial infarction (takes plavix), and influenza vacc need  Hypertension This is a chronic problem. The current episode started more than 1 year ago. The problem is unchanged. The problem is controlled. Associated symptoms include shortness of breath. Pertinent negatives include no anxiety, blurred vision, chest pain, headaches, malaise/fatigue, neck pain, orthopnea, palpitations, peripheral edema, PND or sweats. There are no associated agents to hypertension. There are no known risk factors for coronary artery disease. Past treatments include ACE inhibitors, beta blockers and diuretics. The current treatment provides mild improvement. There are no compliance problems.  Hypertensive end-organ damage includes CAD/MI. There is no history of angina, kidney disease, CVA, heart failure, left ventricular hypertrophy, PVD or retinopathy. There is no history of chronic renal disease, a hypertension causing med or renovascular disease.  Hyperlipidemia This is a chronic problem. The current episode started more than 1 year ago. The problem is controlled. Recent lipid tests were reviewed and are normal. He has no history of chronic renal disease, diabetes, hypothyroidism, liver disease, obesity or nephrotic syndrome. Factors aggravating his hyperlipidemia include beta blockers. Associated symptoms include shortness of breath. Pertinent negatives include no chest pain, focal sensory loss, focal weakness, leg pain or myalgias. Current antihyperlipidemic treatment includes statins. The current treatment provides moderate improvement of lipids. Compliance problems include adherence to diet.  Risk factors for coronary artery disease include dyslipidemia and hypertension.  Heart Problem This is a chronic (multivessel CAD) problem. The current episode started more than 1 year  ago. The problem has been gradually improving. Pertinent negatives include no abdominal pain, anorexia, arthralgias, change in bowel habit, chest pain, chills, congestion, coughing, diaphoresis, fatigue, fever, headaches, joint swelling, myalgias, nausea, neck pain, numbness, rash, sore throat, swollen glands, urinary symptoms, vertigo, visual change, vomiting or weakness (coro). Treatments tried: medication.  Neurologic Problem [name] pertinent negatives include no altered mental status, clumsiness, focal sensory loss, focal weakness, loss of balance, memory loss, near-syncope, slurred speech, syncope, visual change or weakness (coro). Primary symptoms comment: history subdural hematoma. This is a chronic problem. The current episode started more than 1 year ago. The problem is unchanged. Associated symptoms include shortness of breath. Pertinent negatives include no abdominal pain, chest pain, confusion, diaphoresis, fatigue, fever, headaches, nausea, neck pain, palpitations, vertigo or vomiting. (Seizure) Past treatments include nothing. The treatment provided moderate relief. His past medical history is significant for seizures. There is no history of liver disease.    Review of Systems  Constitutional: Negative for chills, diaphoresis, fatigue, fever and malaise/fatigue.  HENT: Negative for congestion and sore throat.   Eyes: Negative for blurred vision.  Respiratory: Positive for shortness of breath. Negative for cough.   Cardiovascular: Negative for chest pain, palpitations, orthopnea, PND and near-syncope.  Gastrointestinal: Negative for abdominal pain, anorexia, change in bowel habit, nausea and vomiting.  Musculoskeletal: Negative for arthralgias, joint swelling, myalgias and neck pain.  Skin: Negative for rash.  Neurological: Negative for vertigo, focal weakness, syncope, weakness (coro), numbness, headaches and loss of balance.  Psychiatric/Behavioral: Negative for confusion and  memory loss.    Patient Active Problem List   Diagnosis Date Noted  . Atherosclerosis of native coronary artery with stable angina pectoris ([place]) 06/21/2018  . Pure hypercholesterolemia 06/21/2018  . Poorly-controlled hypertension 06/21/2018  . Seizure disorder ([place]) 06/21/2018  . Old MI (myocardial infarction) 08/30/2017  . CAD,  multiple vessel 08/01/2017  . NSTEMI (non-ST elevated myocardial infarction) ([place]) 07/20/2017  . Hyperlipidemia 05/10/2017  . Preop cardiovascular exam   . Benign neoplasm of ascending colon   . Polyp of sigmoid colon   . Essential hypertension 03/03/2016    No Known Allergies  Past Surgical History:  Procedure Laterality Date  . BICEPT TENODESIS Right 07/18/2018   Procedure: BICEPS TENODESIS;  Surgeon: Corky Mull, MD;  Location: [facility];  Service: Orthopedics;  Laterality: Right;  . COLONOSCOPY  2011   cleared for 10 yrs  . COLONOSCOPY WITH PROPOFOL N/A 11/06/2016   Procedure: COLONOSCOPY WITH PROPOFOL;  Surgeon: [name], MD;  Location: [place];  Service: Endoscopy;  Laterality: N/A;  . CORONARY STENT INTERVENTION N/A 07/23/2017   Procedure: CORONARY STENT INTERVENTION;  Surgeon: Yolonda Kida, MD;  Location: [place] [facility];  Service: Cardiovascular;  Laterality: N/A;  . HERNIA REPAIR    . KNEE SURGERY Bilateral    2 on R) 1 on L)  . LEFT HEART CATH AND CORONARY ANGIOGRAPHY N/A 07/23/2017   Procedure: LEFT HEART CATH AND CORONARY ANGIOGRAPHY;  Surgeon: Minna Merritts, MD;  Location: [place] [facility];  Service: Cardiovascular;  Laterality: N/A;  . POLYPECTOMY  11/06/2016   Procedure: POLYPECTOMY;  Surgeon: [name], MD;  Location: [place];  Service: Endoscopy;;  . SHOULDER ARTHROSCOPY WITH OPEN ROTATOR CUFF REPAIR Right 07/18/2018   Procedure: SHOULDER ARTHROSCOPY WITH OPEN ROTATOR CUFF REPAIR;  Surgeon: Corky Mull, MD;  Location: [facility];  Service: Orthopedics;  Laterality: Right;    Social  History   Tobacco Use  . Smoking status: Former Research scientist (life sciences)  . Smokeless tobacco: Never Used  . Tobacco comment: quit over 20 yrs ago  Substance Use Topics  . Alcohol use: Yes    Alcohol/week: 0.0 standard drinks    Comment: 1-2 drinks/year  . Drug use: No     Medication list has been reviewed and updated.  Current Meds  Medication Sig  . acetaminophen (TYLENOL) 500 MG tablet Take 500 mg by mouth at bedtime. As needed/ otc  . aspirin EC 81 MG EC tablet Take 1 tablet (81 mg total) daily by mouth.  Marland Kitchen atorvastatin (LIPITOR) 80 MG tablet Take 1 tablet (80 mg total) by mouth daily at 6 PM.  . clopidogrel (PLAVIX) 75 MG tablet Take 1 tablet (75 mg total) by mouth daily.  . fexofenadine (ALLEGRA) 180 MG tablet Take 180 mg by mouth as needed for allergies or rhinitis. otc  . hydrochlorothiazide (HYDRODIURIL) 25 MG tablet Take 1 tablet (25 mg total) by mouth daily.  . metoprolol tartrate (LOPRESSOR) 50 MG tablet Take 1 tablet (50 mg total) by mouth 2 (two) times daily.  . nitroGLYCERIN (NITROSTAT) 0.4 MG SL tablet Place 1 tablet (0.4 mg total) every 5 (five) minutes x 3 doses as needed under the tongue for chest pain.  . ramipril (ALTACE) 10 MG capsule Take 1 capsule (10 mg total) by mouth 2 (two) times daily.    PHQ 2/9 Scores 12/12/2018 06/07/2018 11/05/2017 09/24/2017  PHQ - 2 Score 0 2 1 1   PHQ- 9 Score 1 4 4 3     BP Readings from Last 3 Encounters:  06/18/19 124/80  12/12/18 124/70  07/18/18 117/63    Physical Exam  Wt Readings from Last 3 Encounters:  06/18/19 207 lb (93.9 kg)  12/12/18 211 lb (95.7 kg)  07/18/18 202 lb 9.6 oz (91.9 kg)    BP 124/80  Pulse 72   Ht 5' 9.5" (1.765 m)   Wt 207 lb (93.9 kg)   BMI 30.13 kg/m   Assessment and Plan: 1. Hyperlipidemia, unspecified hyperlipidemia type Chronic.  Controlled.  Continue atorvastatin 80 mg once a day.  Will check lipid panel. - Lipid Panel With LDL/HDL Ratio - atorvastatin (LIPITOR) 80 MG tablet; Take 1 tablet (80 mg  total) by mouth daily at 6 PM.  Dispense: 90 tablet; Refill: 1  2. CAD, multiple vessel Chronic.  Stable.  Will continue control of lipids.  Patient will continue on Plavix 75 mg once a day. - CBC w/Diff/Platelet - clopidogrel (PLAVIX) 75 MG tablet; Take 1 tablet (75 mg total) by mouth daily.  Dispense: 90 tablet; Refill: 1  3. Essential hypertension Chronic.  Controlled.  Will continue hydrochlorothiazide 25 mg once a day and metoprolol 50 mg 1 twice a day and ramipril 10 mg once a day. - Renal Function Panel - hydrochlorothiazide (HYDRODIURIL) 25 MG tablet; Take 1 tablet (25 mg total) by mouth daily.  Dispense: 90 tablet; Refill: 1 - metoprolol tartrate (LOPRESSOR) 50 MG tablet; Take 1 tablet (50 mg total) by mouth 2 (two) times daily.  Dispense: 180 tablet; Refill: 1 - ramipril (ALTACE) 10 MG capsule; Take 1 capsule (10 mg total) by mouth 2 (two) times daily.  Dispense: 180 capsule; Refill: 1  4. Taking medication for chronic disease Patient is taking Plavix for CAD and statin for cholesterol control for which we will check a hepatic function panel and a CBC. - Hepatic function panel  5. History of subdural hematoma Patient had a history of a subdural hematoma 6 mm noted in October 2019.  There is been no residual concerns and no new onset problems.  Reviewed notes with neurology and will not be necessary to continue surveillance.  6. Influenza vaccine needed Discussed and administered - Flu Vaccine QUAD High Dose(Fluad)

## 2019-06-19 LAB — CBC WITH DIFFERENTIAL/PLATELET
Basophils Absolute: 0 10*3/uL (ref 0.0–0.2)
Basos: 1 %
EOS (ABSOLUTE): 0.1 10*3/uL (ref 0.0–0.4)
Eos: 1 %
Hematocrit: 41.5 % (ref 37.5–51.0)
Hemoglobin: 14.5 g/dL (ref 13.0–17.7)
Immature Grans (Abs): 0 10*3/uL (ref 0.0–0.1)
Immature Granulocytes: 0 %
Lymphocytes Absolute: 2.1 10*3/uL (ref 0.7–3.1)
Lymphs: 24 %
MCH: 30.4 pg (ref 26.6–33.0)
MCHC: 34.9 g/dL (ref 31.5–35.7)
MCV: 87 fL (ref 79–97)
Monocytes Absolute: 0.7 10*3/uL (ref 0.1–0.9)
Monocytes: 8 %
Neutrophils Absolute: 5.9 10*3/uL (ref 1.4–7.0)
Neutrophils: 66 %
Platelets: 210 10*3/uL (ref 150–450)
RBC: 4.77 x10E6/uL (ref 4.14–5.80)
RDW: 12.4 % (ref 11.6–15.4)
WBC: 8.9 10*3/uL (ref 3.4–10.8)

## 2019-06-19 LAB — HEPATIC FUNCTION PANEL
ALT: 10 IU/L (ref 0–44)
AST: 19 IU/L (ref 0–40)
Alkaline Phosphatase: 106 IU/L (ref 39–117)
Bilirubin Total: 0.8 mg/dL (ref 0.0–1.2)
Bilirubin, Direct: 0.21 mg/dL (ref 0.00–0.40)
Total Protein: 6.5 g/dL (ref 6.0–8.5)

## 2019-06-19 LAB — LIPID PANEL WITH LDL/HDL RATIO
Cholesterol, Total: 124 mg/dL (ref 100–199)
HDL: 51 mg/dL (ref >39)
LDL Chol Calc (NIH): 53 mg/dL (ref 0–99)
LDL/HDL Ratio: 1 ratio (ref 0.0–3.6)
Triglycerides: 107 mg/dL (ref 0–149)
VLDL Cholesterol Cal: 20 mg/dL (ref 5–40)

## 2019-06-19 LAB — RENAL FUNCTION PANEL
Albumin: 4.4 g/dL (ref 3.8–4.8)
BUN/Creatinine Ratio: 16 (ref 10–24)
BUN: 16 mg/dL (ref 8–27)
CO2: 24 mmol/L (ref 20–29)
Calcium: 9.8 mg/dL (ref 8.6–10.2)
Chloride: 101 mmol/L (ref 96–106)
Creatinine, Ser: 1 mg/dL (ref 0.76–1.27)
GFR calc Af Amer: 88 mL/min/{1.73_m2} (ref >59)
GFR calc non Af Amer: 76 mL/min/{1.73_m2} (ref >59)
Glucose: 94 mg/dL (ref 65–99)
Phosphorus: 3.9 mg/dL (ref 2.8–4.1)
Potassium: 4.4 mmol/L (ref 3.5–5.2)
Sodium: 138 mmol/L (ref 134–144)

## 2019-10-11 ENCOUNTER — Ambulatory Visit: Payer: Medicare HMO | Attending: Internal Medicine

## 2019-10-11 DIAGNOSIS — Z23 Encounter for immunization: Secondary | ICD-10-CM | POA: Insufficient documentation

## 2019-10-11 NOTE — Progress Notes (Signed)
   Covid-19 Vaccination Clinic  Name:  Kanton Bettin    MRN: [id number] DOB: [birth year]  10/11/2019  Mr. Dambrosi was observed post Covid-19 immunization for 15 minutes without incidence. He was provided with Vaccine Information Sheet and instruction to access the V-Safe system.   Mr. Brandenburg was instructed to call 911 with any severe reactions post vaccine: Marland Kitchen Difficulty breathing  . Swelling of your face and throat  . A fast heartbeat  . A bad rash all over your body  . Dizziness and weakness    Immunizations Administered    Name Date Dose VIS Date Route   Pfizer COVID-19 Vaccine 10/11/2019  1:47 PM 0.3 mL 08/29/2019 Intramuscular   Manufacturer: [place]   Lot: [id number]   [place]: [id number]

## 2019-10-17 NOTE — Progress Notes (Signed)
Cardiology Office Note  Date:  10/20/2019   ID:  George Doyle, DOB 1951-07-29, MRN [id number]  PCP:  Juline Patch, MD   Chief Complaint  Patient presents with  . other    OD 12 month f/u appointment no complaints today. Meds reviewed verbally with pt.    HPI:  George Doyle is a pleasant 69 yo male with history of  Seizure disorder 2005 in July 2019 HTN  former tobacco abuse  admitted with chest pain 07/21/2017 NSTEMI, TNT 14 Cath 07/23/2017  , Occluded proximal RCA, collaterals from left to right, Moderate to severe mid Left circumflex dz, tortuous vessel, calcified Moderate inferior wall hypokinesis  attempt PCI to the RCA was unsuccessful, possibly went subintimal Seen by Dr. Irish Lack August 30, 2017 Medical management recommended of his occluded RCA He presents for follow-up of his coronary artery disease, stable angina, preop cardiovascular  Sedentary, no exercise program Wife reports some mild SOB Wonders if it is his weight and conditioning Poor diet   severe right shoulder discomfort on prior clinic visit, Completed surgery with Dr. Roland Rack.,  Symptoms better  Denies any recent seizures No chest pain concerning for angina  Heart rate running low but he is asymptomatic  Discussed prior events with him  emergency room March 27, 2018 for seizure Hospital records reviewed with the patient in detail  full body tonic-clonic seizure-like activity, hurt his right shoulder at that time CT scan head subdural hematoma, Received IV Keppra.   was transferred to [facility]  Echo November 2018  ejection fraction was in the range of 50% to 55%. Aorta: Aortic root was mildly dilated, 3.6 cm - Ascending aorta: The ascending aorta was mildly dilated, 4.1 cm  EKG personally reviewed by myself on todays visit Shows normal sinus rhythm rate 54 bpm ST abnormality V4 through V6, 1 and aVL old inferior MI No significant change compared to prior EKGs   PMH:   has  a past medical history of Allergy, Arthritis, Hypertension, Myocardial infarction ([place]) (07/2017), Pneumonia, and Seizures ([place]).  PSH:    Past Surgical History:  Procedure Laterality Date  . BICEPT TENODESIS Right 07/18/2018   Procedure: BICEPS TENODESIS;  Surgeon: Corky Mull, MD;  Location: [facility];  Service: Orthopedics;  Laterality: Right;  . COLONOSCOPY  2011   cleared for 10 yrs  . COLONOSCOPY WITH PROPOFOL N/A 11/06/2016   Procedure: COLONOSCOPY WITH PROPOFOL;  Surgeon: [name], MD;  Location: [place];  Service: Endoscopy;  Laterality: N/A;  . CORONARY STENT INTERVENTION N/A 07/23/2017   Procedure: CORONARY STENT INTERVENTION;  Surgeon: Yolonda Kida, MD;  Location: [place] [facility];  Service: Cardiovascular;  Laterality: N/A;  . CYST EXCISION     Forearm  . HERNIA REPAIR    . KNEE SURGERY Bilateral    2 on R) 1 on L)  . LEFT HEART CATH AND CORONARY ANGIOGRAPHY N/A 07/23/2017   Procedure: LEFT HEART CATH AND CORONARY ANGIOGRAPHY;  Surgeon: Minna Merritts, MD;  Location: [place] [facility];  Service: Cardiovascular;  Laterality: N/A;  . POLYPECTOMY  11/06/2016   Procedure: POLYPECTOMY;  Surgeon: [name], MD;  Location: [place];  Service: Endoscopy;;  . SHOULDER ARTHROSCOPY WITH OPEN ROTATOR CUFF REPAIR Right 07/18/2018   Procedure: SHOULDER ARTHROSCOPY WITH OPEN ROTATOR CUFF REPAIR;  Surgeon: Corky Mull, MD;  Location: [facility];  Service: Orthopedics;  Laterality: Right;    Current Outpatient Medications  Medication Sig Dispense Refill  .  acetaminophen (TYLENOL) 500 MG tablet Take 500 mg by mouth at bedtime. As needed/ otc    . aspirin EC 81 MG EC tablet Take 1 tablet (81 mg total) daily by mouth. (Patient taking differently: Take 81 mg by mouth. Takes 1 tablet Monday, Wednesday and Friday weekly.) 30 tablet 1  . atorvastatin (LIPITOR) 80 MG tablet Take 1 tablet (80 mg total) by mouth daily at 6 PM. 90 tablet 1  . clopidogrel  (PLAVIX) 75 MG tablet Take 1 tablet (75 mg total) by mouth daily. 90 tablet 1  . fexofenadine (ALLEGRA) 180 MG tablet Take 180 mg by mouth as needed for allergies or rhinitis. otc    . hydrochlorothiazide (HYDRODIURIL) 25 MG tablet Take 1 tablet (25 mg total) by mouth daily. 90 tablet 1  . metoprolol tartrate (LOPRESSOR) 50 MG tablet Take 1 tablet (50 mg total) by mouth 2 (two) times daily. 180 tablet 1  . nitroGLYCERIN (NITROSTAT) 0.4 MG SL tablet Place 1 tablet (0.4 mg total) every 5 (five) minutes x 3 doses as needed under the tongue for chest pain. 30 tablet 2  . ramipril (ALTACE) 10 MG capsule Take 1 capsule (10 mg total) by mouth 2 (two) times daily. 180 capsule 1   No current facility-administered medications for this visit.    Allergies:   Patient has no known allergies.   Social History:  The patient  reports that he has quit smoking. He has never used smokeless tobacco. He reports current alcohol use. He reports that he does not use drugs.   Family History:   family history includes CAD in his father; Heart failure in his mother; Lung cancer in his father.   Review of Systems: Review of Systems  Constitutional: Negative.   Respiratory: Positive for shortness of breath.   Cardiovascular: Negative.   Gastrointestinal: Negative.   Musculoskeletal: Negative.   Neurological: Negative.   Psychiatric/Behavioral: Negative.   All other systems reviewed and are negative.  PHYSICAL EXAM: VS:  BP 120/80 (BP Location: Left Arm, Patient Position: Sitting, Cuff Size: Normal)   Pulse (!) 54   Ht 5\' 9"  (1.753 m)   Wt 211 lb 8 oz (95.9 kg)   SpO2 99%   BMI 31.23 kg/m  , BMI Body mass index is 31.23 kg/m. Constitutional:  oriented to person, place, and time. No distress.  HENT:  Head: Grossly normal Eyes:  no discharge. No scleral icterus.  Neck: No JVD, no carotid bruits  Cardiovascular: Regular rate and rhythm, no murmurs appreciated Pulmonary/Chest: Clear to auscultation  bilaterally, no wheezes or rails Abdominal: Soft.  no distension.  no tenderness.  Musculoskeletal: Normal range of motion Neurological:  normal muscle tone. Coordination normal. No atrophy Skin: Skin warm and dry Psychiatric: normal affect, pleasant  Recent Labs: 06/18/2019: ALT 10; BUN 16; Creatinine, Ser 1.00; Hemoglobin 14.5; Platelets 210; Potassium 4.4; Sodium 138    Lipid Panel Lab Results  Component Value Date   CHOL 124 06/18/2019   HDL 51 06/18/2019   LDLCALC 53 06/18/2019   TRIG 107 06/18/2019    Wt Readings from Last 3 Encounters:  10/20/19 211 lb 8 oz (95.9 kg)  06/18/19 207 lb (93.9 kg)  12/12/18 211 lb (95.7 kg)      ASSESSMENT AND PLAN:  NSTEMI (non-ST elevated myocardial infarction) (HCC) - Chronic shortness of breath on exertion Deconditioned,  recommended regular exercise program Long time spent discussing various exercise programs he could participate in  Essential hypertension  Blood pressure is well controlled  on today's visit. No changes made to the medications. Potassium stable  CAD, multiple vessel Moderate to severe left circumflex disease tortuous vessel, occluded RCA  stable angina symptoms,  Chronic shortness of breath, Most of his time spent today discussing need for exercise program Wife reports he is sedentary at home  Mixed hyperlipidemia Numbers at goal, no medication changes made  Seizures Previous hospital admissions discussed with him from July 2019 No recent seizures, off medication  Disposition:   F/U  12 months   Total encounter time more than 25 minutes  Greater than 50% was spent in counseling and coordination of care with the patient  Orders Placed This Encounter  Procedures  . EKG 12-Lead     Signed, Esmond Plants, M.D., Ph.D. 10/20/2019  [place], [place]

## 2019-10-20 ENCOUNTER — Other Ambulatory Visit: Payer: Self-pay

## 2019-10-20 ENCOUNTER — Ambulatory Visit (INDEPENDENT_AMBULATORY_CARE_PROVIDER_SITE_OTHER): Payer: Medicare HMO | Admitting: Cardiovascular Disease

## 2019-10-20 ENCOUNTER — Encounter: Payer: Self-pay | Admitting: Cardiovascular Disease

## 2019-10-20 VITALS — BP 120/80 | HR 54 | Ht 69.0 in | Wt 211.5 lb

## 2019-10-20 DIAGNOSIS — I25118 Atherosclerotic heart disease of native coronary artery with other forms of angina pectoris: Secondary | ICD-10-CM | POA: Diagnosis not present

## 2019-10-20 DIAGNOSIS — I10 Essential (primary) hypertension: Secondary | ICD-10-CM

## 2019-10-20 DIAGNOSIS — G40909 Epilepsy, unspecified, not intractable, without status epilepticus: Secondary | ICD-10-CM

## 2019-10-20 DIAGNOSIS — E782 Mixed hyperlipidemia: Secondary | ICD-10-CM

## 2019-10-20 NOTE — Patient Instructions (Signed)

## 2019-11-01 ENCOUNTER — Ambulatory Visit: Payer: Medicare HMO | Attending: Internal Medicine

## 2019-11-01 DIAGNOSIS — Z23 Encounter for immunization: Secondary | ICD-10-CM | POA: Insufficient documentation

## 2019-11-01 NOTE — Progress Notes (Signed)
   Covid-19 Vaccination Clinic  Name:  George Doyle    MRN: [id number] DOB: 06-Sep-1951  11/01/2019  [name] was observed post Covid-19 immunization for 15 minutes without incidence. He was provided with Vaccine Information Sheet and instruction to access the V-Safe system.   [name] was instructed to call 911 with any severe reactions post vaccine: Marland Kitchen Difficulty breathing  . Swelling of your face and throat  . A fast heartbeat  . A bad rash all over your body  . Dizziness and weakness    Immunizations Administered    Name Date Dose VIS Date Route   Pfizer COVID-19 Vaccine 11/01/2019 12:42 PM 0.3 mL 08/29/2019 Intramuscular   Manufacturer: [place]   Lot: [id number]   [place]: [id number]

## 2019-11-10 ENCOUNTER — Ambulatory Visit (INDEPENDENT_AMBULATORY_CARE_PROVIDER_SITE_OTHER): Payer: Medicare HMO

## 2019-11-10 VITALS — Ht 69.0 in | Wt 205.0 lb

## 2019-11-10 DIAGNOSIS — Z0000 Encounter for general adult medical examination without abnormal findings: Secondary | ICD-10-CM | POA: Diagnosis not present

## 2019-11-10 NOTE — Progress Notes (Signed)
Subjective:   George Doyle is a 69 y.o. male who presents for an Initial Medicare Annual Wellness Visit.  Virtual Visit via Telephone Note  I connected with George Doyle on 11/10/19 at  2:40 PM EST by telephone and verified that I am speaking with the correct person using two identifiers.  Medicare Annual Wellness visit completed telephonically due to Covid-19 pandemic.   Location: Patient: home Provider: office   I discussed the limitations, risks, security and privacy concerns of performing an evaluation and management service by telephone and the availability of in person appointments. The patient expressed understanding and agreed to proceed.  Some vital signs may be absent or patient reported.   [name], LPN    Review of Systems   Cardiac Risk Factors include: dyslipidemia;male gender;hypertension;obesity (BMI >30kg/m2)    Objective:    Today's Vitals   11/10/19 1448  Weight: 205 lb (93 kg)  Height: 5\' 9"  (1.753 m)   Body mass index is 30.27 kg/m.  Advanced Directives 11/10/2019 07/18/2018 07/15/2018 04/02/2018 09/24/2017 11/06/2016 07/12/2015  Does Patient Have a Medical Advance Directive? Yes Yes Yes Yes Yes Yes No  Type of Paramedic of [place];Living will [place];Living will [place];Living will [place];Living will [place];Living will [place] -  Does patient want to make changes to medical advance directive? - No - Patient declined No - Patient declined - No - Patient declined - -  Copy of [place] in Chart? Yes - validated most recent copy scanned in chart (See row information) Yes Yes - Yes Yes -  Would patient like information on creating a medical advance directive? - - - - - - No - patient declined information  Some encounter information is confidential and restricted. Go to Review  Flowsheets activity to see all data.    Current Medications (verified) Outpatient Encounter Medications as of 11/10/2019  Medication Sig  . acetaminophen (TYLENOL) 500 MG tablet Take 500 mg by mouth at bedtime. As needed/ otc  . aspirin EC 81 MG EC tablet Take 1 tablet (81 mg total) daily by mouth. (Patient taking differently: Take 81 mg by mouth. Takes 1 tablet Monday, Wednesday and Friday weekly.)  . atorvastatin (LIPITOR) 80 MG tablet Take 1 tablet (80 mg total) by mouth daily at 6 PM.  . clopidogrel (PLAVIX) 75 MG tablet Take 1 tablet (75 mg total) by mouth daily.  . fexofenadine (ALLEGRA) 180 MG tablet Take 180 mg by mouth as needed for allergies or rhinitis. otc  . hydrochlorothiazide (HYDRODIURIL) 25 MG tablet Take 1 tablet (25 mg total) by mouth daily.  . metoprolol tartrate (LOPRESSOR) 50 MG tablet Take 1 tablet (50 mg total) by mouth 2 (two) times daily.  . nitroGLYCERIN (NITROSTAT) 0.4 MG SL tablet Place 1 tablet (0.4 mg total) every 5 (five) minutes x 3 doses as needed under the tongue for chest pain.  . ramipril (ALTACE) 10 MG capsule Take 1 capsule (10 mg total) by mouth 2 (two) times daily.   No facility-administered encounter medications on file as of 11/10/2019.    Allergies (verified) Patient has no known allergies.   History: Past Medical History:  Diagnosis Date  . Allergy   . Arthritis    knees, hands  . Hypertension   . Myocardial infarction ([place]) 07/2017  . Pneumonia   . Seizures ([place])    approx 1970's, 2005, June 2019  Testing  showed no cause. Extended EEG scheduled for November 2019   Past Surgical History:  Procedure Laterality Date  . BICEPT TENODESIS Right 07/18/2018   Procedure: BICEPS TENODESIS;  Surgeon: Corky Mull, MD;  Location: [facility];  Service: Orthopedics;  Laterality: Right;  . COLONOSCOPY  2011   cleared for 10 yrs  . COLONOSCOPY WITH PROPOFOL N/A 11/06/2016   Procedure: COLONOSCOPY WITH PROPOFOL;  Surgeon: [name], MD;  Location:  [facility];  Service: Endoscopy;  Laterality: N/A;  . CORONARY STENT INTERVENTION N/A 07/23/2017   Procedure: CORONARY STENT INTERVENTION;  Surgeon: Yolonda Kida, MD;  Location: [facility];  Service: Cardiovascular;  Laterality: N/A;  . CYST EXCISION     Forearm  . HERNIA REPAIR    . KNEE SURGERY Bilateral    2 on R) 1 on L)  . LEFT HEART CATH AND CORONARY ANGIOGRAPHY N/A 07/23/2017   Procedure: LEFT HEART CATH AND CORONARY ANGIOGRAPHY;  Surgeon: Minna Merritts, MD;  Location: [facility];  Service: Cardiovascular;  Laterality: N/A;  . POLYPECTOMY  11/06/2016   Procedure: POLYPECTOMY;  Surgeon: [name], MD;  Location: [place];  Service: Endoscopy;;  . SHOULDER ARTHROSCOPY WITH OPEN ROTATOR CUFF REPAIR Right 07/18/2018   Procedure: SHOULDER ARTHROSCOPY WITH OPEN ROTATOR CUFF REPAIR;  Surgeon: Corky Mull, MD;  Location: [facility];  Service: Orthopedics;  Laterality: Right;   Family History  Problem Relation Age of Onset  . CAD Father   . Lung cancer Father   . Heart failure Mother    Social History   Socioeconomic History  . Marital status: Married    Spouse name: Arbie Cookey  . Number of children: 2  . Years of education: Not on file  . Highest education level: Not on file  Occupational History  . Not on file  Tobacco Use  . Smoking status: Former Research scientist (life sciences)  . Smokeless tobacco: Never Used  . Tobacco comment: quit over 20 yrs ago  Substance and Sexual Activity  . Alcohol use: Yes    Alcohol/week: 0.0 standard drinks    Comment: 1-2 drinks/year  . Drug use: No  . Sexual activity: Not Currently  Other Topics Concern  . Not on file  Social History Narrative  . Not on file   Social Determinants of Health   Financial Resource Strain: Low Risk   . Difficulty of Paying Living Expenses: Not hard at all  Food Insecurity: No Food Insecurity  . Worried About Charity fundraiser in the Last Year: Never true  . Ran Out of Food in the  Last Year: Never true  Transportation Needs: No Transportation Needs  . Lack of Transportation (Medical): No  . Lack of Transportation (Non-Medical): No  Physical Activity: Inactive  . Days of Exercise per Week: 0 days  . Minutes of Exercise per Session: 0 min  Stress: No Stress Concern Present  . Feeling of Stress : Not at all  Social Connections: Unknown  . Frequency of Communication with Friends and Family: Patient refused  . Frequency of Social Gatherings with Friends and Family: Patient refused  . Attends Religious Services: Patient refused  . Active Member of Clubs or Organizations: Patient refused  . Attends Archivist Meetings: Patient refused  . Marital Status: Married   Tobacco Counseling Counseling given: Not Answered Comment: quit over 20 yrs ago   Clinical Intake:  Pre-visit preparation completed: Yes  Pain : No/denies pain     BMI -  recorded: 30.27 Nutritional Status: BMI > 30  Obese Nutritional Risks: None Diabetes: No  How often do you need to have someone help you when you read instructions, pamphlets, or other written materials from your doctor or pharmacy?: 1 - Never  Interpreter Needed?: No  Information entered by :: [name] LPN  Activities of Daily Living In your present state of health, do you have any difficulty performing the following activities: 11/10/2019  Hearing? Y  Comment declines hearing aids  Vision? N  Difficulty concentrating or making decisions? N  Walking or climbing stairs? N  Dressing or bathing? N  Doing errands, shopping? N  Preparing Food and eating ? N  Using the Toilet? N  In the past six months, have you accidently leaked urine? N  Do you have problems with loss of bowel control? N  Managing your Medications? N  Managing your Finances? N  Housekeeping or managing your Housekeeping? N  Some recent data might be hidden     Immunizations and Health Maintenance Immunization History  Administered  Date(s) Administered  . Fluad Quad(high Dose 65+) 06/18/2019  . Influenza, High Dose Seasonal PF 06/25/2018  . Influenza,inj,Quad PF,6+ Mos 05/10/2017  . Influenza-Unspecified 06/28/2015  . PFIZER SARS-COV-2 Vaccination 10/11/2019, 11/01/2019  . Pneumococcal Conjugate-13 10/12/2016  . Pneumococcal Polysaccharide-23 07/12/2015, 12/25/2017  . Tdap 10/12/2016  . Zoster Recombinat (Shingrix) 04/26/2017, 08/23/2017   There are no preventive care reminders to display for this patient.  Patient Care Team: Juline Patch, MD as PCP - General (Family Medicine)  Indicate any recent Medical Services you may have received from other than [facility] providers in the past year (date may be approximate).    Assessment:   This is a routine wellness examination for Alann.  Hearing/Vision screen  Hearing Screening   125Hz  250Hz  500Hz  1000Hz  2000Hz  3000Hz  4000Hz  6000Hz  8000Hz   Right ear:           Left ear:           Comments: Pt states mild hearing difficulty, needs hearing evaluation. States trouble with sinuses and right ear congested.   Vision Screening Comments: Vision screen[facility] with [facility]ano [facility] in [facility]  Dietary issues and exercise activities discussed: Current Exercise Habits: The patient does not participate in regular exercise at present, Exercise limited by: None identified  Goals   None    Depression Screen PHQ 2/9 Scores 11/10/2019 12/12/2018 06/07/2018 11/05/2017  PHQ - 2 Score 0 0 2 1  PHQ- 9 Score - 1 4 4     Fall Risk Fall Risk  11/10/2019 09/24/2017 05/10/2017 03/03/2016  Falls in the past year? 0 No No No  Number falls in past yr: 0 - - -  Injury with Fall? 0 - - -  Risk for fall due to : No Fall Risks History of fall(s);Impaired balance/gait - -  Risk for fall due to: Comment - knees sometimes give out on patient and have caused falls in the past but not in the past 12 months.  - -  Follow up Falls prevention discussed - - -    FALL RISK PREVENTION PERTAINING TO THE HOME:  Any  stairs in or around the home? No  If so, do they handrails? No   Home free of loose throw rugs in walkways, pet beds, electrical cords, etc? Yes  Adequate lighting in your home to reduce risk of falls? Yes   ASSISTIVE DEVICES UTILIZED TO PREVENT FALLS:  Life alert? No  Use of a cane, walker  or w/c? No  Grab bars in the bathroom? Yes  Shower chair or bench in shower? Yes  Elevated toilet seat or a handicapped toilet? No   DME ORDERS:  DME order needed?  No   TIMED UP AND GO:  Was the test performed? No . Telephonic visit.   Education: Fall risk prevention has been discussed.  Intervention(s) required? No   Cognitive Function:     6CIT Screen 11/10/2019  What Year? 0 points  What month? 0 points  What time? 0 points  Count back from 20 0 points  Months in reverse 0 points  Repeat phrase 2 points  Total Score 2    Screening Tests Health Maintenance  Topic Date Due  . COLONOSCOPY  11/06/2021  . TETANUS/TDAP  10/12/2026  . INFLUENZA VACCINE  Completed  . Hepatitis C Screening  Completed  . PNA vac Low Risk Adult  Completed    Qualifies for Shingles Vaccine? Yes . Shingrix series completed.   Tdap: Up to date  Flu Vaccine: Up to date  Pneumococcal Vaccine: Up to date  Cancer Screenings:  Colorectal Screening: Completed 11/06/16. Repeat every 5 years.   Lung Cancer Screening: (Low Dose CT Chest recommended if Age 70-80 years, 30 pack-year currently smoking OR have quit w/in 15years.) does not qualify.   Additional Screening:  Hepatitis C Screening: does qualify; Completed 05/10/17.   Vision Screening: Recommended annual ophthalmology exams for early detection of glaucoma and other disorders of the eye. Is the patient up to date with their annual eye exam?  Yes  Who is the provider or what is the name of the office in which the pt attends annual eye exams? Dr. Atilano Median  Dental Screening: Recommended annual dental exams for proper oral hygiene  Community  Resource Referral:  CRR required this visit?  No      Plan:    I have personally reviewed and addressed the Medicare Annual Wellness questionnaire and have noted the following in the patient's chart:  A. Medical and social history B. Use of alcohol, tobacco or illicit drugs  C. Current medications and supplements D. Functional ability and status E.  Nutritional status F.  Physical activity G. Advance directives H. List of other physicians I.  Hospitalizations, surgeries, and ER visits in previous 12 months J.  [place] such as hearing and vision if needed, cognitive and depression L. Referrals and appointments   In addition, I have reviewed and discussed with patient certain preventive protocols, quality metrics, and best practice recommendations. A written personalized care plan for preventive services as well as general preventive health recommendations were provided to patient.   Signed,  [name], LPN Nurse Health Advisor   Nurse Notes: none

## 2019-11-10 NOTE — Patient Instructions (Signed)
[name] , Thank you for taking time to come for your Medicare Wellness Visit. I appreciate your ongoing commitment to your health goals. Please review the following plan we discussed and let me know if I can assist you in the future.   Screening recommendations/referrals: Colonoscopy: done 11/06/16 Recommended yearly ophthalmology/optometry visit for glaucoma screening and checkup Recommended yearly dental visit for hygiene and checkup  Vaccinations: Influenza vaccine: done 06/18/19 Pneumococcal vaccine: done 10/12/16 Tdap vaccine: done 10/12/16 Shingles vaccine: Shingrix series completed 2018    Conditions/risks identified:   Free hearing clinics offered in [facility]   George Doyle [address], [place], [place] [address] [phone]  [facility] [place], [place], [place] [address] [phone]   Next appointment: Please follow up in one year for your Medicare Annual Wellness visit.    Preventive Care 43 Years and Older, Male Preventive care refers to lifestyle choices and visits with your health care provider that can promote health and wellness. What does preventive care include?  A yearly physical exam. This is also called an annual well check.  Dental exams once or twice a year.  Routine eye exams. Ask your health care provider how often you should have your eyes checked.  Personal lifestyle choices, including:  Daily care of your teeth and gums.  Regular physical activity.  Eating a healthy diet.  Avoiding tobacco and drug use.  Limiting alcohol use.  Practicing safe sex.  Taking low doses of aspirin every day.  Taking vitamin and mineral supplements as recommended by your health care provider. What happens during an annual well check? The services and screenings done by your health care provider during your annual well check will depend on your age, overall health, lifestyle risk factors, and family history  of disease. Counseling  Your health care provider may ask you questions about your:  Alcohol use.  Tobacco use.  Drug use.  Emotional well-being.  Home and relationship well-being.  Sexual activity.  Eating habits.  History of falls.  Memory and ability to understand (cognition).  Work and work Statistician. Screening  You may have the following tests or measurements:  Height, weight, and BMI.  Blood pressure.  Lipid and cholesterol levels. These may be checked every 5 years, or more frequently if you are over 71 years old.  Skin check.  Lung cancer screening. You may have this screening every year starting at age 71 if you have a 30-pack-year history of smoking and currently smoke or have quit within the past 15 years.  Fecal occult blood test (FOBT) of the stool. You may have this test every year starting at age 71.  Flexible sigmoidoscopy or colonoscopy. You may have a sigmoidoscopy every 5 years or a colonoscopy every 10 years starting at age 71.  Prostate cancer screening. Recommendations will vary depending on your family history and other risks.  Hepatitis C blood test.  Hepatitis B blood test.  Sexually transmitted disease (STD) testing.  Diabetes screening. This is done by checking your blood sugar (glucose) after you have not eaten for a while (fasting). You may have this done every 1-3 years.  Abdominal aortic aneurysm (AAA) screening. You may need this if you are a current or former smoker.  Osteoporosis. You may be screened starting at age 71 if you are at high risk. Talk with your health care provider about your test results, treatment options, and if necessary, the need for more tests. Vaccines  Your health care provider may recommend certain vaccines, such as:  Influenza vaccine. This is recommended every year.  Tetanus, diphtheria, and acellular pertussis (Tdap, Td) vaccine. You may need a Td booster every 10 years.  Zoster vaccine. You may  need this after age 71.  Pneumococcal 13-valent conjugate (PCV13) vaccine. One dose is recommended after age 71.  Pneumococcal polysaccharide (PPSV23) vaccine. One dose is recommended after age 71. Talk to your health care provider about which screenings and vaccines you need and how often you need them. This information is not intended to replace advice given to you by your health care provider. Make sure you discuss any questions you have with your health care provider. Document Released: 10/01/2015 Document Revised: 05/24/2016 Document Reviewed: 07/06/2015 Elsevier Interactive Patient Education  2017 [place] Prevention in the Home Falls can cause injuries. They can happen to people of all ages. There are many things you can do to make your home safe and to help prevent falls. What can I do on the outside of my home?  Regularly fix the edges of walkways and driveways and fix any cracks.  Remove anything that might make you trip as you walk through a door, such as a raised step or threshold.  Trim any bushes or trees on the path to your home.  Use bright outdoor lighting.  Clear any walking paths of anything that might make someone trip, such as rocks or tools.  Regularly check to see if handrails are loose or broken. Make sure that both sides of any steps have handrails.  Any raised decks and porches should have guardrails on the edges.  Have any leaves, snow, or ice cleared regularly.  Use sand or salt on walking paths during winter.  Clean up any spills in your garage right away. This includes oil or grease spills. What can I do in the bathroom?  Use night lights.  Install grab bars by the toilet and in the tub and shower. Do not use towel bars as grab bars.  Use non-skid mats or decals in the tub or shower.  If you need to sit down in the shower, use a plastic, non-slip stool.  Keep the floor dry. Clean up any water that spills on the floor as soon as it  happens.  Remove soap buildup in the tub or shower regularly.  Attach bath mats securely with double-sided non-slip rug tape.  Do not have throw rugs and other things on the floor that can make you trip. What can I do in the bedroom?  Use night lights.  Make sure that you have a light by your bed that is easy to reach.  Do not use any sheets or blankets that are too big for your bed. They should not hang down onto the floor.  Have a firm chair that has side arms. You can use this for support while you get dressed.  Do not have throw rugs and other things on the floor that can make you trip. What can I do in the kitchen?  Clean up any spills right away.  Avoid walking on wet floors.  Keep items that you use a lot in easy-to-reach places.  If you need to reach something above you, use a strong step stool that has a grab bar.  Keep electrical cords out of the way.  Do not use floor polish or wax that makes floors slippery. If you must use wax, use non-skid floor wax.  Do  not have throw rugs and other things on the floor that can make you trip. What can I do with my stairs?  Do not leave any items on the stairs.  Make sure that there are handrails on both sides of the stairs and use them. Fix handrails that are broken or loose. Make sure that handrails are as long as the stairways.  Check any carpeting to make sure that it is firmly attached to the stairs. Fix any carpet that is loose or worn.  Avoid having throw rugs at the top or bottom of the stairs. If you do have throw rugs, attach them to the floor with carpet tape.  Make sure that you have a light switch at the top of the stairs and the bottom of the stairs. If you do not have them, ask someone to add them for you. What else can I do to help prevent falls?  Wear shoes that:  Do not have high heels.  Have rubber bottoms.  Are comfortable and fit you well.  Are closed at the toe. Do not wear sandals.  If you  use a stepladder:  Make sure that it is fully opened. Do not climb a closed stepladder.  Make sure that both sides of the stepladder are locked into place.  Ask someone to hold it for you, if possible.  Clearly mark and make sure that you can see:  Any grab bars or handrails.  First and last steps.  Where the edge of each step is.  Use tools that help you move around (mobility aids) if they are needed. These include:  Canes.  Walkers.  Scooters.  Crutches.  Turn on the lights when you go into a dark area. Replace any light bulbs as soon as they burn out.  Set up your furniture so you have a clear path. Avoid moving your furniture around.  If any of your floors are uneven, fix them.  If there are any pets around you, be aware of where they are.  Review your medicines with your doctor. Some medicines can make you feel dizzy. This can increase your chance of falling. Ask your doctor what other things that you can do to help prevent falls. This information is not intended to replace advice given to you by your health care provider. Make sure you discuss any questions you have with your health care provider. Document Released: 07/01/2009 Document Revised: 02/10/2016 Document Reviewed: 10/09/2014 Elsevier Interactive Patient Education  2017 [employer].

## 2019-12-16 ENCOUNTER — Ambulatory Visit: Payer: Medicare HMO | Admitting: Family Medicine

## 2019-12-16 ENCOUNTER — Other Ambulatory Visit: Payer: Self-pay

## 2019-12-16 ENCOUNTER — Encounter: Payer: Self-pay | Admitting: Family Medicine

## 2019-12-16 VITALS — BP 130/80 | HR 64 | Ht 69.0 in | Wt 212.0 lb

## 2019-12-16 DIAGNOSIS — I10 Essential (primary) hypertension: Secondary | ICD-10-CM

## 2019-12-16 DIAGNOSIS — K421 Umbilical hernia with gangrene: Secondary | ICD-10-CM

## 2019-12-16 DIAGNOSIS — I2510 Atherosclerotic heart disease of native coronary artery without angina pectoris: Secondary | ICD-10-CM | POA: Diagnosis not present

## 2019-12-16 DIAGNOSIS — H1132 Conjunctival hemorrhage, left eye: Secondary | ICD-10-CM

## 2019-12-16 DIAGNOSIS — E785 Hyperlipidemia, unspecified: Secondary | ICD-10-CM

## 2019-12-16 DIAGNOSIS — M170 Bilateral primary osteoarthritis of knee: Secondary | ICD-10-CM

## 2019-12-16 DIAGNOSIS — S20212A Contusion of left front wall of thorax, initial encounter: Secondary | ICD-10-CM

## 2019-12-16 DIAGNOSIS — R69 Illness, unspecified: Secondary | ICD-10-CM

## 2019-12-16 MED ORDER — RAMIPRIL 10 MG PO CAPS: 10.0000 mg | ORAL_CAPSULE | Freq: Two times a day (BID) | ORAL | 1 refills | Status: DC

## 2019-12-16 MED ORDER — CLOPIDOGREL BISULFATE 75 MG PO TABS: 75.0000 mg | ORAL_TABLET | Freq: Every day | ORAL | 1 refills | Status: DC

## 2019-12-16 MED ORDER — METOPROLOL TARTRATE 50 MG PO TABS: 50.0000 mg | ORAL_TABLET | Freq: Two times a day (BID) | ORAL | 1 refills | Status: DC

## 2019-12-16 MED ORDER — HYDROCHLOROTHIAZIDE 25 MG PO TABS: 25.0000 mg | ORAL_TABLET | Freq: Every day | ORAL | 1 refills | Status: DC

## 2019-12-16 MED ORDER — ATORVASTATIN CALCIUM 80 MG PO TABS: 80.0000 mg | ORAL_TABLET | Freq: Every day | ORAL | 1 refills | Status: DC

## 2019-12-16 MED ORDER — DICLOFENAC SODIUM 1 % EX GEL: 2.0000 g | Freq: Four times a day (QID) | CUTANEOUS | 6 refills | Status: DC

## 2019-12-16 NOTE — Patient Instructions (Addendum)
Umbilical Hernia, Adult  A hernia is a bulge of tissue that pushes through an opening between muscles. An umbilical hernia happens in the abdomen, near the belly button (umbilicus). The hernia may contain tissues from the small intestine, large intestine, or fatty tissue covering the intestines (omentum). Umbilical hernias in adults tend to get worse over time, and they require surgical treatment. There are several types of umbilical hernias. You may have:  A hernia located just above or below the umbilicus (indirect hernia). This is the most common type of umbilical hernia in adults.  A hernia that forms through an opening formed by the umbilicus (direct hernia).  A hernia that comes and goes (reducible hernia). A reducible hernia may be visible only when you strain, lift something heavy, or cough. This type of hernia can be pushed back into the abdomen (reduced).  A hernia that traps abdominal tissue inside the hernia (incarcerated hernia). This type of hernia cannot be reduced.  A hernia that cuts off blood flow to the tissues inside the hernia (strangulated hernia). The tissues can start to die if this happens. This type of hernia requires emergency treatment. What are the causes? An umbilical hernia happens when tissue inside the abdomen presses on a weak area of the abdominal muscles. What increases the risk? You may have a greater risk of this condition if you:  Are obese.  Have had several pregnancies.  Have a buildup of fluid inside your abdomen (ascites).  Have had surgery that weakens the abdominal muscles. What are the signs or symptoms? The main symptom of this condition is a painless bulge at or near the belly button. A reducible hernia may be visible only when you strain, lift something heavy, or cough. Other symptoms may include:  Dull pain.  A feeling of pressure. Symptoms of a strangulated hernia may include:  Pain that gets increasingly worse.  Nausea and  vomiting.  Pain when pressing on the hernia.  Skin over the hernia becoming red or purple.  Constipation.  Blood in the stool. How is this diagnosed? This condition may be diagnosed based on:  A physical exam. You may be asked to cough or strain while standing. These actions increase the pressure inside your abdomen and force the hernia through the opening in your muscles. Your health care provider may try to reduce the hernia by pressing on it.  Your symptoms and medical history. How is this treated? Surgery is the only treatment for an umbilical hernia. Surgery for a strangulated hernia is done as soon as possible. If you have a small hernia that is not incarcerated, you may need to lose weight before having surgery. Follow these instructions at home:  Lose weight, if told by your health care provider.  Do not try to push the hernia back in.  Watch your hernia for any changes in color or size. Tell your health care provider if any changes occur.  You may need to avoid activities that increase pressure on your hernia.  Do not lift anything that is heavier than 10 lb (4.5 kg) until your health care provider says that this is safe.  Take over-the-counter and prescription medicines only as told by your health care provider.  Keep all follow-up visits as told by your health care provider. This is important. Contact a health care provider if:  Your hernia gets larger.  Your hernia becomes painful. Get help right away if:  You develop sudden, severe pain near the area of your hernia.    You have pain as well as nausea or vomiting.  You have pain and the skin over your hernia changes color.  You develop a fever. This information is not intended to replace advice given to you by your health care provider. Make sure you discuss any questions you have with your health care provider. Document Revised: 10/17/2017 Document Reviewed: 03/05/2017 Elsevier Patient Education  [place].  Subconjunctival Hemorrhage Subconjunctival hemorrhage is bleeding that happens between the white part of your eye (sclera) and the clear membrane that covers the outside of your eye (conjunctiva). There are many tiny blood vessels near the surface of your eye. A subconjunctival hemorrhage happens when one or more of these vessels breaks and bleeds, causing a red patch to appear on your eye. This is similar to a bruise. Depending on the amount of bleeding, the red patch may only cover a small area of your eye or it may cover the entire visible part of the sclera. If a lot of blood collects under the conjunctiva, there may also be swelling. Subconjunctival hemorrhages do not affect your vision or cause pain, but your eye may feel irritated if there is swelling. Subconjunctival hemorrhages usually do not require treatment, and they usually disappear on their own within two weeks. What are the causes? This condition may be caused by:  Mild trauma, such as rubbing your eye too hard.  Blunt injuries, such as from playing sports or having contact with a deployed airbag.  Coughing, sneezing, or vomiting.  Straining, such as when lifting a heavy object.  High blood pressure.  Recent eye surgery.  Diabetes.  Certain medicines, especially blood thinners (anticoagulants).  Other conditions, such as eye tumors, bleeding disorders, or blood vessel abnormalities. Subconjunctival hemorrhages can also happen without an obvious cause. What are the signs or symptoms? Symptoms of this condition include:  A bright red or dark red patch on the white part of the eye. The red area may: ? Spread out to cover a larger area of the eye before it goes away. ? Turn brownish-yellow before it goes away.  Swelling around the eye.  Mild eye irritation. How is this diagnosed? This condition is diagnosed with a physical exam. If your subconjunctival hemorrhage was caused by trauma, your health care  provider may refer you to an eye specialist (ophthalmologist) or another specialist to check for other injuries. You may have other tests, including:  An eye exam.  A blood pressure check.  Blood tests to check for bleeding disorders. If your subconjunctival hemorrhage was caused by trauma, X-rays or a CT scan may be done to check for other injuries. How is this treated? Usually, treatment is not needed for this condition. If you have discomfort, your health care provider may recommend eye drops or cold compresses. Follow these instructions at home:  Take over-the-counter and prescription medicines only as directed by your health care provider.  Use eye drops or cold compresses to help with discomfort as directed by your health care provider.  Avoid activities, things, and environments that may irritate or injure your eye.  Keep all follow-up visits as told by your health care provider. This is important. Contact a health care provider if:  You have pain in your eye.  The bleeding does not go away within 3 weeks.  You keep getting new subconjunctival hemorrhages. Get help right away if:  Your vision changes or you have difficulty seeing.  You suddenly develop severe sensitivity to light.  You develop  a severe headache, persistent vomiting, confusion, or abnormal tiredness (lethargy).  Your eye seems to bulge or protrude from your eye socket.  You develop unexplained bruises on your body.  You have unexplained bleeding in another area of your body. Summary  Subconjunctival hemorrhage is bleeding that happens between the white part of your eye and the clear membrane that covers the outside of your eye.  This condition is similar to a bruise.  Subconjunctival hemorrhages usually do not require treatment, and they usually disappear on their own within two weeks.  Use eye drops or cold compresses to help with discomfort as directed by your health care provider. This  information is not intended to replace advice given to you by your health care provider. Make sure you discuss any questions you have with your health care provider. Document Revised: 02/19/2019 Document Reviewed: 06/05/2018 Elsevier Patient Education  [place].

## 2019-12-16 NOTE — Progress Notes (Signed)
Date:  12/16/2019   Name:  George Doyle   DOB:  12/30/1950   MRN:  [id number]   Chief Complaint: Hypertension, Hyperlipidemia, myocardial infarction, and Arthritis (wants to try meloxicam)  Hypertension This is a chronic problem. The current episode started more than 1 year ago. The problem has been gradually improving since onset. The problem is controlled. Pertinent negatives include no anxiety, blurred vision, chest pain, headaches, malaise/fatigue, neck pain, orthopnea, palpitations, peripheral edema, PND, shortness of breath or sweats. There are no associated agents to hypertension. Risk factors for coronary artery disease include dyslipidemia. Past treatments include ACE inhibitors, diuretics and beta blockers. The current treatment provides moderate improvement. There are no compliance problems.  There is no history of angina, kidney disease, CAD/MI, CVA, heart failure, left ventricular hypertrophy, PVD or retinopathy. There is no history of chronic renal disease, a hypertension causing med or renovascular disease.  Hyperlipidemia This is a chronic problem. The current episode started more than 1 year ago. The problem is controlled. Recent lipid tests were reviewed and are normal. He has no history of chronic renal disease, diabetes, hypothyroidism, liver disease, obesity or nephrotic syndrome. Factors aggravating his hyperlipidemia include thiazides. Pertinent negatives include no chest pain, focal weakness, myalgias or shortness of breath. Current antihyperlipidemic treatment includes statins. The current treatment provides moderate improvement of lipids. There are no compliance problems.  Risk factors for coronary artery disease include post-menopausal, hypertension, male sex and dyslipidemia.  Arthritis Presents for follow-up visit. He reports no pain, stiffness, joint swelling or joint warmth. The symptoms have been stable. Affected locations include the right knee and left  knee. Pertinent negatives include no diarrhea, dysuria, fever or rash. Compliance problems include medication cost.     Lab Results  Component Value Date   CREATININE 1.00 06/18/2019   BUN 16 06/18/2019   NA 138 06/18/2019   K 4.4 06/18/2019   CL 101 06/18/2019   CO2 24 06/18/2019   Lab Results  Component Value Date   CHOL 124 06/18/2019   HDL 51 06/18/2019   LDLCALC 53 06/18/2019   TRIG 107 06/18/2019   CHOLHDL 2.1 06/11/2018   No results found for: TSH No results found for: HGBA1C Lab Results  Component Value Date   WBC 8.9 06/18/2019   HGB 14.5 06/18/2019   HCT 41.5 06/18/2019   MCV 87 06/18/2019   PLT 210 06/18/2019   Lab Results  Component Value Date   ALT 10 06/18/2019   AST 19 06/18/2019   ALKPHOS 106 06/18/2019   BILITOT 0.8 06/18/2019     Review of Systems  Constitutional: Negative for chills, fever and malaise/fatigue.  HENT: Negative for drooling, ear discharge, ear pain and sore throat.   Eyes: Negative for blurred vision.  Respiratory: Negative for cough, shortness of breath and wheezing.   Cardiovascular: Negative for chest pain, palpitations, orthopnea, leg swelling and PND.  Gastrointestinal: Negative for abdominal pain, blood in stool, constipation, diarrhea and nausea.  Endocrine: Negative for polydipsia.  Genitourinary: Negative for dysuria, frequency, hematuria and urgency.  Musculoskeletal: Positive for arthritis. Negative for back pain, joint swelling, myalgias, neck pain and stiffness.  Skin: Negative for rash.  Allergic/Immunologic: Negative for environmental allergies.  Neurological: Negative for dizziness, focal weakness and headaches.  Hematological: Does not bruise/bleed easily.  Psychiatric/Behavioral: Negative for suicidal ideas. The patient is not nervous/anxious.     Patient Active Problem List   Diagnosis Date Noted  . Benign neoplasm of skin of left forearm 01/27/2019  .  Pure hypercholesterolemia 06/21/2018  .  Poorly-controlled hypertension 06/21/2018  . Seizure disorder ([place]) 06/21/2018  . Hill-Sachs lesion of right shoulder 06/19/2018  . Injury of tendon of long head of right biceps 06/19/2018  . Rotator cuff tendinitis, right 06/19/2018  . Traumatic complete tear of right rotator cuff 06/19/2018  . Headache disorder 05/09/2018  . CAD (coronary artery disease), native coronary artery 03/28/2018  . History of seizures 03/28/2018  . Subdural hematoma ([place]) 03/27/2018  . Syncope 03/27/2018  . Old MI (myocardial infarction) 08/30/2017  . CAD, multiple vessel 08/01/2017  . NSTEMI (non-ST elevated myocardial infarction) ([place]) 07/20/2017  . Hyperlipidemia 05/10/2017  . Preop cardiovascular exam   . Benign neoplasm of ascending colon   . Polyp of sigmoid colon   . Essential hypertension 03/03/2016    No Known Allergies  Past Surgical History:  Procedure Laterality Date  . BICEPT TENODESIS Right 07/18/2018   Procedure: BICEPS TENODESIS;  Surgeon: Corky Mull, MD;  Location: [facility];  Service: Orthopedics;  Laterality: Right;  . COLONOSCOPY  2011   cleared for 10 yrs  . COLONOSCOPY WITH PROPOFOL N/A 11/06/2016   Procedure: COLONOSCOPY WITH PROPOFOL;  Surgeon: [name], MD;  Location: [facility];  Service: Endoscopy;  Laterality: N/A;  . CORONARY STENT INTERVENTION N/A 07/23/2017   Procedure: CORONARY STENT INTERVENTION;  Surgeon: Yolonda Kida, MD;  Location: [facility];  Service: Cardiovascular;  Laterality: N/A;  . CYST EXCISION     Forearm  . HERNIA REPAIR    . KNEE SURGERY Bilateral    2 on R) 1 on L)  . LEFT HEART CATH AND CORONARY ANGIOGRAPHY N/A 07/23/2017   Procedure: LEFT HEART CATH AND CORONARY ANGIOGRAPHY;  Surgeon: Minna Merritts, MD;  Location: [facility];  Service: Cardiovascular;  Laterality: N/A;  . POLYPECTOMY  11/06/2016   Procedure: POLYPECTOMY;  Surgeon: [name], MD;  Location: [facility];  Service: Endoscopy;;  .  SHOULDER ARTHROSCOPY WITH OPEN ROTATOR CUFF REPAIR Right 07/18/2018   Procedure: SHOULDER ARTHROSCOPY WITH OPEN ROTATOR CUFF REPAIR;  Surgeon: Corky Mull, MD;  Location: [facility];  Service: Orthopedics;  Laterality: Right;    Social History   Tobacco Use  . Smoking status: Former Research scientist (life sciences)  . Smokeless tobacco: Never Used  . Tobacco comment: quit over 20 yrs ago  Substance Use Topics  . Alcohol use: Yes    Alcohol/week: 0.0 standard drinks    Comment: 1-2 drinks/year  . Drug use: No     Medication list has been reviewed and updated.  Current Meds  Medication Sig  . acetaminophen (TYLENOL) 500 MG tablet Take 500 mg by mouth at bedtime. As needed/ otc  . aspirin EC 81 MG EC tablet Take 1 tablet (81 mg total) daily by mouth. (Patient taking differently: Take 81 mg by mouth. Takes 1 tablet Monday, Wednesday and Friday weekly.)  . atorvastatin (LIPITOR) 80 MG tablet Take 1 tablet (80 mg total) by mouth daily at 6 PM.  . clopidogrel (PLAVIX) 75 MG tablet Take 1 tablet (75 mg total) by mouth daily.  . fexofenadine (ALLEGRA) 180 MG tablet Take 180 mg by mouth as needed for allergies or rhinitis. otc  . hydrochlorothiazide (HYDRODIURIL) 25 MG tablet Take 1 tablet (25 mg total) by mouth daily.  . metoprolol tartrate (LOPRESSOR) 50 MG tablet Take 1 tablet (50 mg total) by mouth 2 (two) times daily.  . nitroGLYCERIN (NITROSTAT) 0.4 MG SL tablet Place 1 tablet (0.4 mg  total) every 5 (five) minutes x 3 doses as needed under the tongue for chest pain.  . ramipril (ALTACE) 10 MG capsule Take 1 capsule (10 mg total) by mouth 2 (two) times daily.    PHQ 2/9 Scores 12/16/2019 11/10/2019 12/12/2018 06/07/2018  PHQ - 2 Score 0 0 0 2  PHQ- 9 Score 1 - 1 4    BP Readings from Last 3 Encounters:  12/16/19 130/80  10/20/19 120/80  06/18/19 124/80    Physical Exam Vitals and nursing note reviewed.  HENT:     Head: Normocephalic.     Right Ear: External ear normal.     Left Ear: External ear  normal.     Nose: Nose normal.  Eyes:     General: No scleral icterus.       Right eye: No discharge.        Left eye: No discharge.     Conjunctiva/sclera: Conjunctivae normal.     Pupils: Pupils are equal, round, and reactive to light.  Neck:     Thyroid: No thyromegaly.     Vascular: No JVD.     Trachea: No tracheal deviation.  Cardiovascular:     Rate and Rhythm: Normal rate and regular rhythm.     Heart sounds: Normal heart sounds. No murmur. No friction rub. No gallop.   Pulmonary:     Effort: No respiratory distress.     Breath sounds: Normal breath sounds. No wheezing or rales.  Abdominal:     General: Bowel sounds are normal.     Palpations: Abdomen is soft. There is no mass.     Tenderness: There is no abdominal tenderness. There is no guarding or rebound.  Musculoskeletal:        General: No tenderness. Normal range of motion.     Cervical back: Normal range of motion and neck supple.  Lymphadenopathy:     Cervical: No cervical adenopathy.  Skin:    General: Skin is warm.     Findings: No rash.  Neurological:     Mental Status: He is alert and oriented to person, place, and time.     Cranial Nerves: No cranial nerve deficit.     Deep Tendon Reflexes: Reflexes are normal and symmetric.     Wt Readings from Last 3 Encounters:  12/16/19 212 lb (96.2 kg)  11/10/19 205 lb (93 kg)  10/20/19 211 lb 8 oz (95.9 kg)    BP 130/80   Pulse 64   Ht 5\' 9"  (1.753 m)   Wt 212 lb (96.2 kg)   SpO2 100%   BMI 31.31 kg/m   Assessment and Plan: 1. Essential hypertension Chronic.  Controlled.  Stable.  Continue hydrochlorothiazide 25 mg metoprolol 50 mg 1 twice a day and ramipril 10 mg twice a day.  Will check CMP for renal function and electrolytes. - Comprehensive Metabolic Panel (CMET) - hydrochlorothiazide (HYDRODIURIL) 25 MG tablet; Take 1 tablet (25 mg total) by mouth daily.  Dispense: 90 tablet; Refill: 1 - metoprolol tartrate (LOPRESSOR) 50 MG tablet; Take 1 tablet  (50 mg total) by mouth 2 (two) times daily.  Dispense: 180 tablet; Refill: 1 - ramipril (ALTACE) 10 MG capsule; Take 1 capsule (10 mg total) by mouth 2 (two) times daily.  Dispense: 180 capsule; Refill: 1  2. Hyperlipidemia, unspecified hyperlipidemia type Chronic.  Controlled.  Stable.  Continue atorvastatin 80 mg once a day.  Will check lipid panel for LDL. - Lipid Panel With LDL/HDL Ratio - atorvastatin (  LIPITOR) 80 MG tablet; Take 1 tablet (80 mg total) by mouth daily at 6 PM.  Dispense: 90 tablet; Refill: 1  3. CAD, multiple vessel Reviewed patient's cardiology notes and patient has generalized coronary artery disease.  Patient will continue his Plavix 75 mg daily and have encouraged continued follow-up with Dr. Rockey Situ for maintenance. - clopidogrel (PLAVIX) 75 MG tablet; Take 1 tablet (75 mg total) by mouth daily.  Dispense: 90 tablet; Refill: 1  4. Arthritis of both knees Patient's been unable to exercise because of bilateral arthritis in both knees patient had knee replacements several years ago 1 by Dr. Amedeo Plenty and the other by Dr. Mauri Pole. 5. Contusion of left front wall of thorax, initial encounter Patient is noted a contusion of the left chest wall.  This has a resolving ecchymosis with no palpable tenderness  6. Umbilical hernia with gangrene Patient has a reducible umbilical hernia which is easily reducible with minimal discomfort.  Patient's been given information and what to watch for  7. Scleral hemorrhage of left eye New onset.  Patient has a small scleral hemorrhage of the left sclera medial aspect there is no pterygium and no excessive bleeding.  8. Taking medication for chronic disease Patient is on Plavix and with this increase in the contusion and the scleral hemorrhage we will check for thrombocytopenia. - CBC w/Diff/Platelet

## 2019-12-17 LAB — COMPREHENSIVE METABOLIC PANEL
ALT: 9 IU/L (ref 0–44)
AST: 19 IU/L (ref 0–40)
Albumin/Globulin Ratio: 2.2 (ref 1.2–2.2)
Albumin: 4.2 g/dL (ref 3.8–4.8)
Alkaline Phosphatase: 85 IU/L (ref 39–117)
BUN/Creatinine Ratio: 15 (ref 10–24)
BUN: 17 mg/dL (ref 8–27)
Bilirubin Total: 0.8 mg/dL (ref 0.0–1.2)
CO2: 26 mmol/L (ref 20–29)
Calcium: 9.1 mg/dL (ref 8.6–10.2)
Chloride: 100 mmol/L (ref 96–106)
Creatinine, Ser: 1.12 mg/dL (ref 0.76–1.27)
GFR calc Af Amer: 77 mL/min/{1.73_m2} (ref >59)
GFR calc non Af Amer: 66 mL/min/{1.73_m2} (ref >59)
Globulin, Total: 1.9 g/dL (ref 1.5–4.5)
Glucose: 90 mg/dL (ref 65–99)
Potassium: 4.3 mmol/L (ref 3.5–5.2)
Sodium: 137 mmol/L (ref 134–144)
Total Protein: 6.1 g/dL (ref 6.0–8.5)

## 2019-12-17 LAB — LIPID PANEL WITH LDL/HDL RATIO
Cholesterol, Total: 109 mg/dL (ref 100–199)
HDL: 50 mg/dL (ref >39)
LDL Chol Calc (NIH): 45 mg/dL (ref 0–99)
LDL/HDL Ratio: 0.9 ratio (ref 0.0–3.6)
Triglycerides: 67 mg/dL (ref 0–149)
VLDL Cholesterol Cal: 14 mg/dL (ref 5–40)

## 2019-12-17 LAB — CBC WITH DIFFERENTIAL/PLATELET
Basophils Absolute: 0.1 10*3/uL (ref 0.0–0.2)
Basos: 1 %
EOS (ABSOLUTE): 0.1 10*3/uL (ref 0.0–0.4)
Eos: 1 %
Hematocrit: 41 % (ref 37.5–51.0)
Hemoglobin: 14.1 g/dL (ref 13.0–17.7)
Immature Grans (Abs): 0 10*3/uL (ref 0.0–0.1)
Immature Granulocytes: 0 %
Lymphocytes Absolute: 2.1 10*3/uL (ref 0.7–3.1)
Lymphs: 28 %
MCH: 30.9 pg (ref 26.6–33.0)
MCHC: 34.4 g/dL (ref 31.5–35.7)
MCV: 90 fL (ref 79–97)
Monocytes Absolute: 0.6 10*3/uL (ref 0.1–0.9)
Monocytes: 9 %
Neutrophils Absolute: 4.6 10*3/uL (ref 1.4–7.0)
Neutrophils: 61 %
Platelets: 211 10*3/uL (ref 150–450)
RBC: 4.56 x10E6/uL (ref 4.14–5.80)
RDW: 12.9 % (ref 11.6–15.4)
WBC: 7.4 10*3/uL (ref 3.4–10.8)

## 2020-01-17 IMAGING — MR MRI OF THE LEFT FOREARM WITHOUT AND WITH CONTRAST
9 series · 40 of 40 positions shown · IV contrast (gadavist)
Comparison: None.

CLINICAL DATA: Chronic left forearm mass.

EXAM:
MRI OF THE LEFT FOREARM WITHOUT AND WITH CONTRAST
TECHNIQUE: Multiplanar, multisequence MR imaging of the left forearm was
performed before and after the administration of intravenous
contrast.
CONTRAST:  9 mm Gadavist intravenous contrast.

[Series 3: T2 fat-sat · axial · left · 5.0mm · 0.39mm/px · z∈[-165,+182]mm · 7 of 60 slices shown (1 of 2)]
[im 1/60]
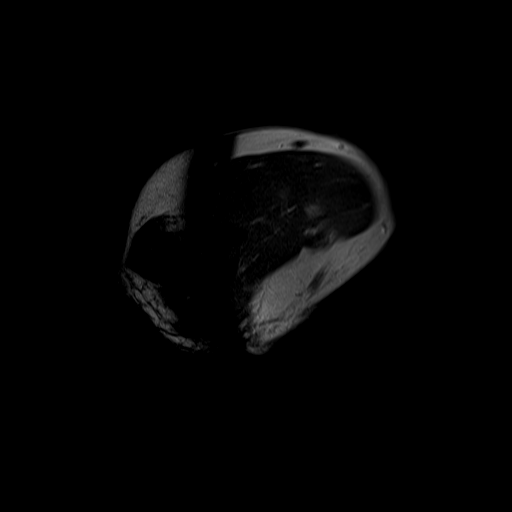
[im 10/60]
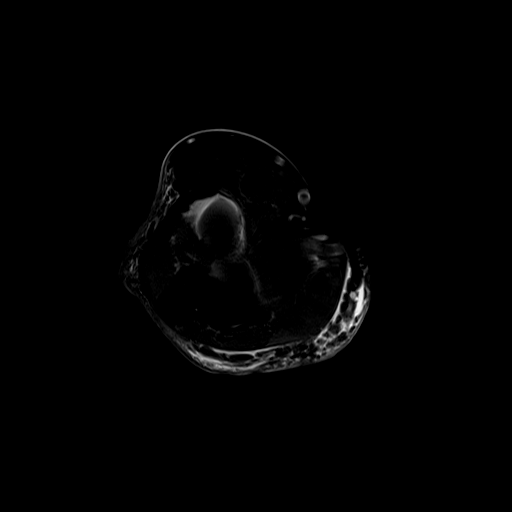
[im 20/60]
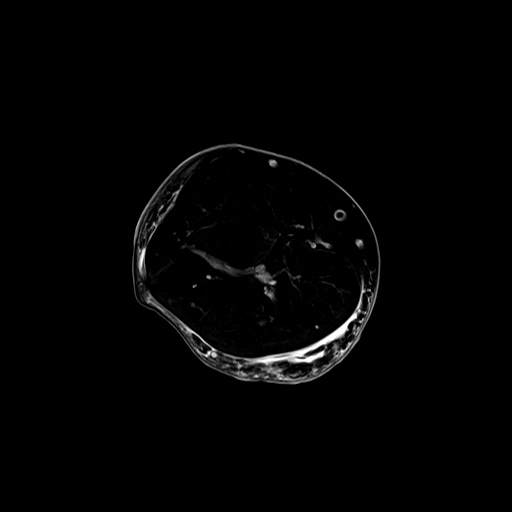
[im 30/60]
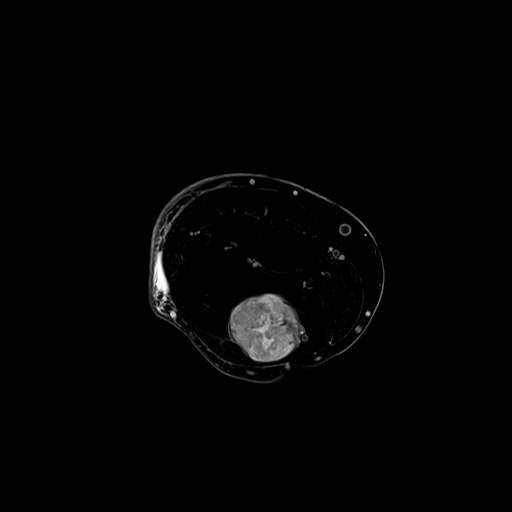
[im 40/60]
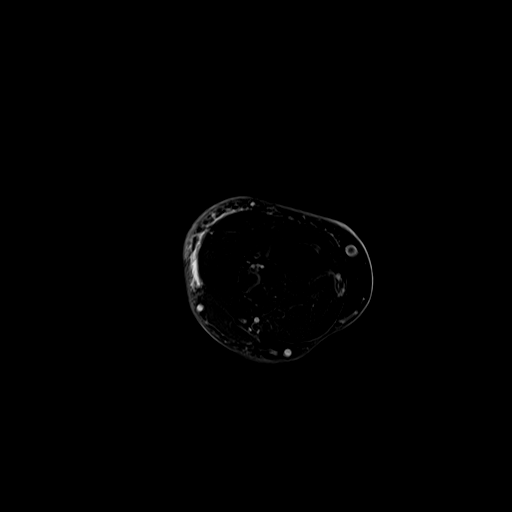
[im 50/60]
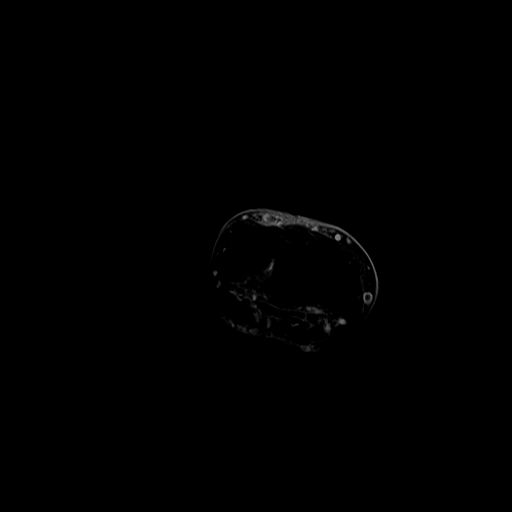
[im 60/60]
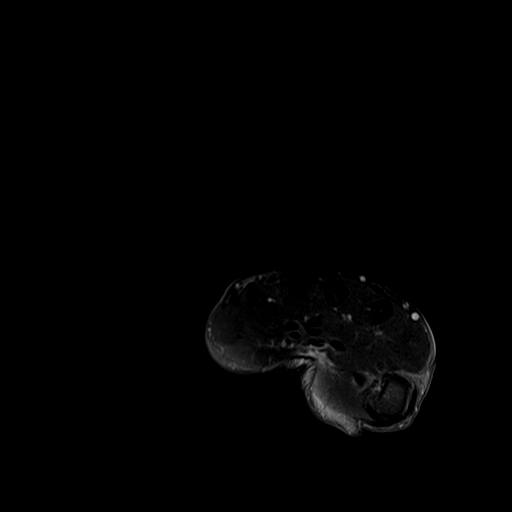

[Series 4: T1 · axial · left · 5.0mm · 0.66mm/px · z∈[-165,+182]mm · 6 of 60 slices shown (1 of 3)]
[im 1/60]
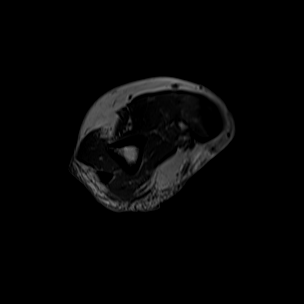
[im 12/60]
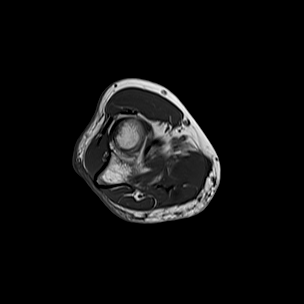
[im 24/60]
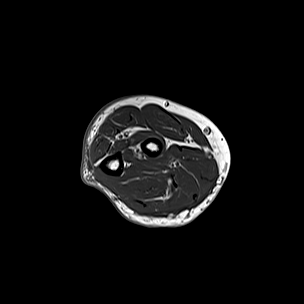
[im 36/60]
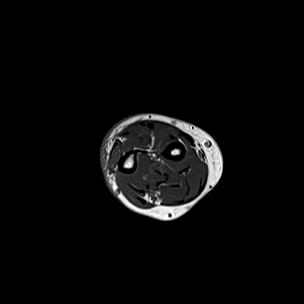
[im 48/60]
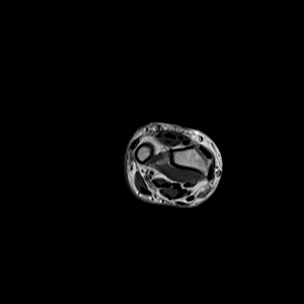
[im 60/60]
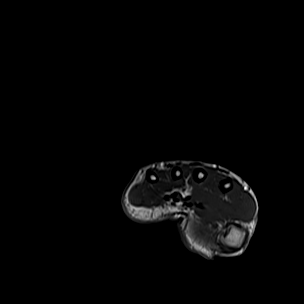

[Series 5: T1 · sagittal · left · 4.0mm · 1.11mm/px · 3 of 28 slices shown (2 of 3)]
[im 1/28]
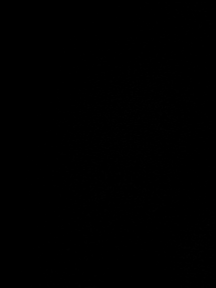
[im 14/28]
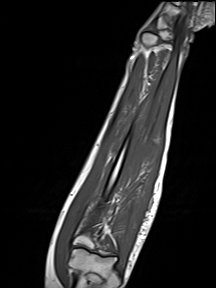
[im 28/28]
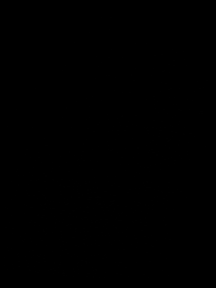

[Series 6: STIR · sagittal · left · 4.0mm · 1.11mm/px · 3 of 29 slices shown]
[im 1/29]
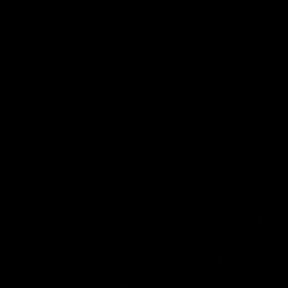
[im 15/29]
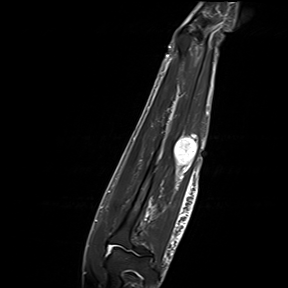
[im 29/29]
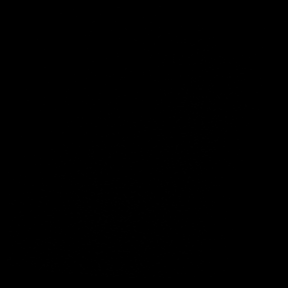

[Series 7: T2 fat-sat · coronal · left · 4.0mm · 1.05mm/px · 3 of 26 slices shown (2 of 2)]
[im 1/26]
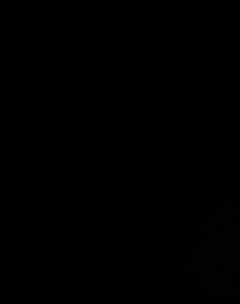
[im 13/26]
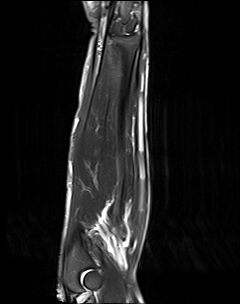
[im 26/26]
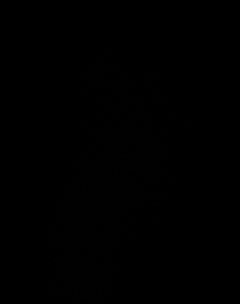

[Series 8: T1 · coronal · left · 4.0mm · 1.11mm/px · 3 of 26 slices shown (3 of 3)]
[im 1/26]
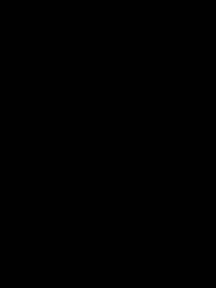
[im 13/26]
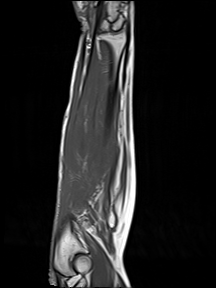
[im 26/26]
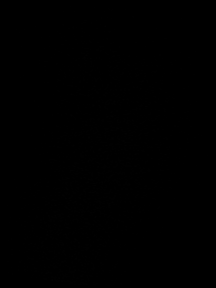

[Series 9: T1 fat-sat · axial · non-contrast · left · 5.0mm · 0.66mm/px · z∈[-165,+182]mm · 6 of 60 slices shown]
[im 1/60]
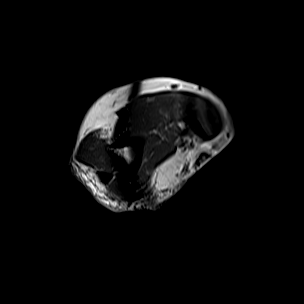
[im 12/60]
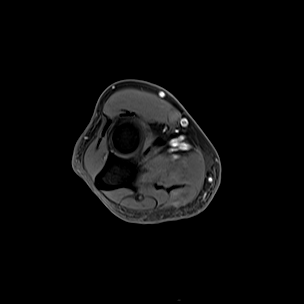
[im 24/60]
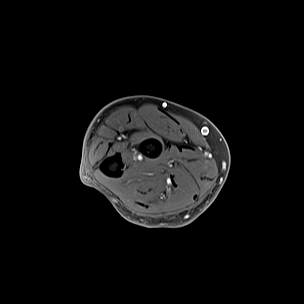
[im 36/60]
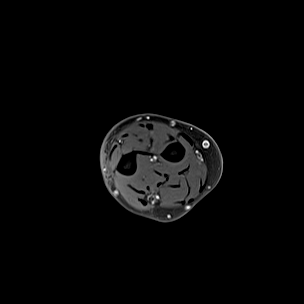
[im 48/60]
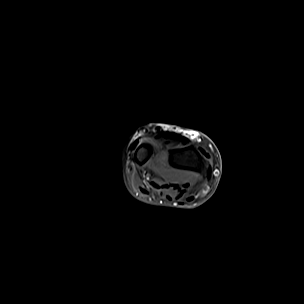
[im 60/60]
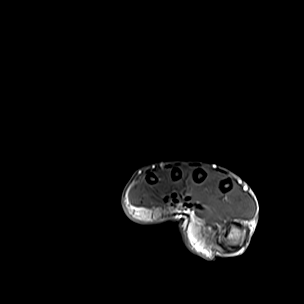

[Series 10: T1 fat-sat post-contrast · axial · left · 5.0mm · 0.66mm/px · z∈[-165,+182]mm · 6 of 60 slices shown (1 of 2)]
[im 1/60]
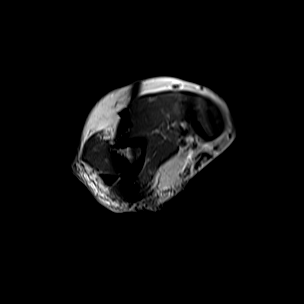
[im 12/60]
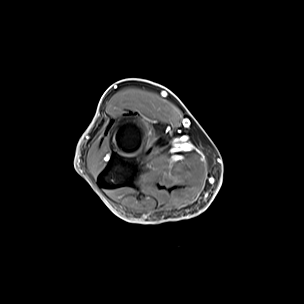
[im 24/60]
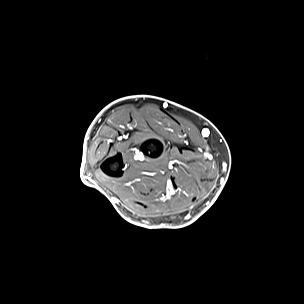
[im 36/60]
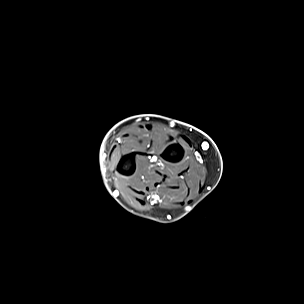
[im 48/60]
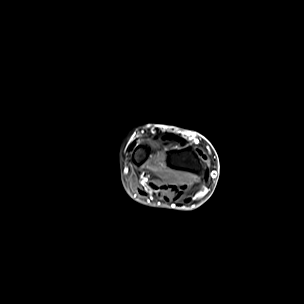
[im 60/60]
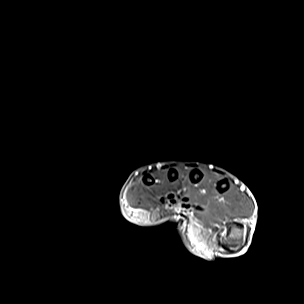

[Series 11: T1 fat-sat post-contrast · sagittal · left · 4.0mm · 1.25mm/px · 3 of 29 slices shown (2 of 2)]
[im 1/29]
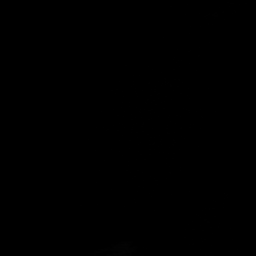
[im 15/29]
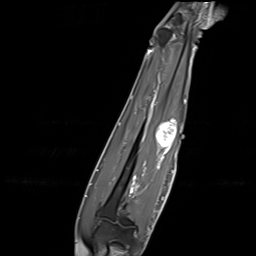
[im 29/29]
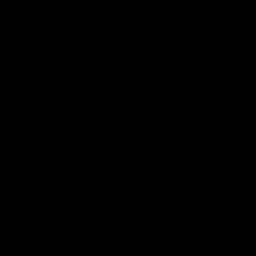

[40 of 40 positions shown; findings below may reference images not displayed]

FINDINGS: Bones/Joint/Cartilage

No marrow signal abnormality. No fracture or dislocation. Normal
alignment. No joint effusion.

Muscles and Tendons
Flexor and extensor compartment tendons are intact. No muscle edema
or atrophy.

Soft tissue
In the volar mid forearm along the course of the ulnar nerve, there
is a 2.7 x 2.9 x 3.6 cm heterogeneously T2 hyperintense, T1
isointense, heterogeneously enhancing mass with a tapered rim of fat
adjacent to the proximal and distal ends of the lesion, most
consistent with a peripheral nerve sheath tumor. No fluid collection
or hematoma. Mild soft tissue swelling along the volar and ulnar
aspect of the mid and proximal forearm.
IMPRESSION: 2.7 x 2.9 x 3.6 cm solid mass in the volar mid forearm along the
course of the ulnar nerve, most consistent with a peripheral nerve
sheath tumor, likely a schwannoma.

## 2020-03-10 ENCOUNTER — Other Ambulatory Visit: Payer: Self-pay | Admitting: Family Medicine

## 2020-03-10 NOTE — Telephone Encounter (Signed)
Pt request refill  nitroGLYCERIN (NITROSTAT) 0.4 MG SL tablet  [id number] - [place], [place] AT [facility] OF [address] Phone:  [phone]  Fax:  [phone]

## 2020-03-10 NOTE — Telephone Encounter (Signed)
Requested medication (s) are due for refill today - expired Rx  Requested medication (s) are on the active medication list -yes  Future visit scheduled - yes  Last refill: Rx written 2 years ago  Notes to clinic: Request for medication written by outside provider  Requested Prescriptions  Pending Prescriptions Disp Refills   nitroGLYCERIN (NITROSTAT) 0.4 MG SL tablet 30 tablet 2    Sig: Place 1 tablet (0.4 mg total) under the tongue every 5 (five) minutes x 3 doses as needed for chest pain.      Cardiovascular:  Nitrates Passed - 03/10/2020 11:20 AM      Passed - Last BP in normal range    BP Readings from Last 1 Encounters:  12/16/19 130/80          Passed - Last Heart Rate in normal range    Pulse Readings from Last 1 Encounters:  12/16/19 64          Passed - Valid encounter within last 12 months    Recent Outpatient Visits           2 months ago Essential hypertension   [facility] Juline Patch, MD   8 months ago Hyperlipidemia, unspecified hyperlipidemia type   [facility] Juline Patch, MD   1 year ago Essential hypertension   [facility] Juline Patch, MD   1 year ago Essential hypertension   [facility] Juline Patch, MD   2 years ago Acute maxillary sinusitis, recurrence not specified   [facility] Juline Patch, MD       Future Appointments             In 3 months Juline Patch, MD [facility], [facility]                Requested Prescriptions  Pending Prescriptions Disp Refills   nitroGLYCERIN (NITROSTAT) 0.4 MG SL tablet 30 tablet 2    Sig: Place 1 tablet (0.4 mg total) under the tongue every 5 (five) minutes x 3 doses as needed for chest pain.      Cardiovascular:  Nitrates Passed - 03/10/2020 11:20 AM      Passed - Last BP in normal range    BP Readings from Last 1 Encounters:  12/16/19 130/80          Passed - Last Heart Rate in normal range    Pulse Readings from  Last 1 Encounters:  12/16/19 64          Passed - Valid encounter within last 12 months    Recent Outpatient Visits           2 months ago Essential hypertension   [place], [place]   8 months ago Hyperlipidemia, unspecified hyperlipidemia type   [facility] Juline Patch, MD   1 year ago Essential hypertension   [place], Deanna C, MD   1 year ago Essential hypertension   [facility] Juline Patch, MD   2 years ago Acute maxillary sinusitis, recurrence not specified   [facility] Juline Patch, MD       Future Appointments             In 3 months Juline Patch, MD [facility], [facility]

## 2020-03-11 ENCOUNTER — Telehealth: Payer: Self-pay | Admitting: Family Medicine

## 2020-03-11 MED ORDER — NITROGLYCERIN 0.4 MG SL SUBL: 0.4000 mg | SUBLINGUAL_TABLET | SUBLINGUAL | 2 refills | Status: DC | PRN

## 2020-03-11 NOTE — Telephone Encounter (Signed)
Copied from [place] [id number]. Topic: General - Other >> Mar 11, 2020 10:01 AM Hinda Lenis D wrote: Reason for CRM: PT will like to speak with Baxter Flattery personally

## 2020-03-11 NOTE — Telephone Encounter (Signed)
Pt called to get a refill on nitroglycerin tabs. His has expired. Told pt I called this in this morning. He verbalized understanding.   CM

## 2020-06-09 ENCOUNTER — Ambulatory Visit (INDEPENDENT_AMBULATORY_CARE_PROVIDER_SITE_OTHER): Payer: Medicare HMO | Admitting: Family Medicine

## 2020-06-09 ENCOUNTER — Other Ambulatory Visit: Payer: Self-pay

## 2020-06-09 ENCOUNTER — Telehealth: Payer: Self-pay | Admitting: Family Medicine

## 2020-06-09 ENCOUNTER — Encounter: Payer: Self-pay | Admitting: Family Medicine

## 2020-06-09 ENCOUNTER — Ambulatory Visit
Admission: RE | Admit: 2020-06-09 | Discharge: 2020-06-09 | Disposition: A | Discharge status: Home / Self Care | Payer: Medicare HMO | Source: Ambulatory Visit | Attending: Family Medicine | Admitting: Family Medicine

## 2020-06-09 ENCOUNTER — Ambulatory Visit: Payer: Medicare HMO

## 2020-06-09 ENCOUNTER — Ambulatory Visit
Admission: RE | Admit: 2020-06-09 | Discharge: 2020-06-09 | Disposition: A | Discharge status: Home / Self Care | Payer: Medicare HMO | Attending: Family Medicine | Admitting: Family Medicine

## 2020-06-09 VITALS — BP 130/80 | HR 80 | Temp 98.2°F | Resp 12 | Ht 69.0 in | Wt 189.0 lb

## 2020-06-09 DIAGNOSIS — J0100 Acute maxillary sinusitis, unspecified: Secondary | ICD-10-CM | POA: Diagnosis not present

## 2020-06-09 DIAGNOSIS — R059 Cough, unspecified: Secondary | ICD-10-CM

## 2020-06-09 DIAGNOSIS — R05 Cough: Secondary | ICD-10-CM

## 2020-06-09 MED ORDER — AMOXICILLIN-POT CLAVULANATE 875-125 MG PO TABS: 1.0000 | ORAL_TABLET | Freq: Two times a day (BID) | ORAL | 0 refills | Status: DC

## 2020-06-09 MED ORDER — GUAIFENESIN-CODEINE 100-10 MG/5ML PO SYRP: 5.0000 mL | ORAL_SOLUTION | Freq: Four times a day (QID) | ORAL | 0 refills | Status: DC | PRN

## 2020-06-09 NOTE — Progress Notes (Signed)
Date:  06/09/2020   Name:  George Doyle   DOB:  [birth year]   MRN:  [id number]   Chief Complaint: Sinusitis (sore throat, cough, headache- no production)  Sinusitis This is a new problem. The current episode started in the past 7 days (Saturday/sore throat). The problem is unchanged. There has been no fever. The pain is mild. Associated symptoms include congestion, coughing, headaches, sinus pressure, sneezing and a sore throat. Pertinent negatives include no chills, diaphoresis, ear pain, hoarse voice, neck pain, shortness of breath or swollen glands. Past treatments include oral decongestants ([place] day and night). The treatment provided mild relief.    Lab Results  Component Value Date   CREATININE 1.12 12/16/2019   BUN 17 12/16/2019   NA 137 12/16/2019   K 4.3 12/16/2019   CL 100 12/16/2019   CO2 26 12/16/2019   Lab Results  Component Value Date   CHOL 109 12/16/2019   HDL 50 12/16/2019   LDLCALC 45 12/16/2019   TRIG 67 12/16/2019   CHOLHDL 2.1 06/11/2018   No results found for: TSH No results found for: HGBA1C Lab Results  Component Value Date   WBC 7.4 12/16/2019   HGB 14.1 12/16/2019   HCT 41.0 12/16/2019   MCV 90 12/16/2019   PLT 211 12/16/2019   Lab Results  Component Value Date   ALT 9 12/16/2019   AST 19 12/16/2019   ALKPHOS 85 12/16/2019   BILITOT 0.8 12/16/2019     Review of Systems  Constitutional: Negative for chills, diaphoresis and fever.  HENT: Positive for congestion, sinus pressure, sneezing and sore throat. Negative for drooling, ear discharge, ear pain and hoarse voice.   Respiratory: Positive for cough. Negative for shortness of breath and wheezing.   Cardiovascular: Negative for chest pain, palpitations and leg swelling.  Gastrointestinal: Negative for abdominal pain, blood in stool, constipation, diarrhea and nausea.  Endocrine: Negative for polydipsia.  Genitourinary: Negative for dysuria, frequency, hematuria and  urgency.  Musculoskeletal: Negative for back pain, myalgias and neck pain.  Skin: Negative for rash.  Allergic/Immunologic: Negative for environmental allergies.  Neurological: Positive for headaches. Negative for dizziness.  Hematological: Does not bruise/bleed easily.  Psychiatric/Behavioral: Negative for suicidal ideas. The patient is not nervous/anxious.     Patient Active Problem List   Diagnosis Date Noted  . Benign neoplasm of skin of left forearm 01/27/2019  . Pure hypercholesterolemia 06/21/2018  . Poorly-controlled hypertension 06/21/2018  . Seizure disorder ([place]) 06/21/2018  . Hill-Sachs lesion of right shoulder 06/19/2018  . Injury of tendon of long head of right biceps 06/19/2018  . Rotator cuff tendinitis, right 06/19/2018  . Traumatic complete tear of right rotator cuff 06/19/2018  . Headache disorder 05/09/2018  . CAD (coronary artery disease), native coronary artery 03/28/2018  . History of seizures 03/28/2018  . Subdural hematoma ([place]) 03/27/2018  . Syncope 03/27/2018  . Old MI (myocardial infarction) 08/30/2017  . CAD, multiple vessel 08/01/2017  . NSTEMI (non-ST elevated myocardial infarction) ([place]) 07/20/2017  . Hyperlipidemia 05/10/2017  . Preop cardiovascular exam   . Benign neoplasm of ascending colon   . Polyp of sigmoid colon   . Essential hypertension 03/03/2016    No Known Allergies  Past Surgical History:  Procedure Laterality Date  . BICEPT TENODESIS Right 07/18/2018   Procedure: BICEPS TENODESIS;  Surgeon: Corky Mull, MD;  Location: [facility];  Service: Orthopedics;  Laterality: Right;  . COLONOSCOPY  2011   cleared for 10 yrs  . COLONOSCOPY  WITH PROPOFOL N/A 11/06/2016   Procedure: COLONOSCOPY WITH PROPOFOL;  Surgeon: [name], MD;  Location: [facility];  Service: Endoscopy;  Laterality: N/A;  . CORONARY STENT INTERVENTION N/A 07/23/2017   Procedure: CORONARY STENT INTERVENTION;  Surgeon: Yolonda Kida, MD;  Location: [facility];  Service: Cardiovascular;  Laterality: N/A;  . CYST EXCISION     Forearm  . HERNIA REPAIR    . KNEE SURGERY Bilateral    2 on R) 1 on L)  . LEFT HEART CATH AND CORONARY ANGIOGRAPHY N/A 07/23/2017   Procedure: LEFT HEART CATH AND CORONARY ANGIOGRAPHY;  Surgeon: Minna Merritts, MD;  Location: [facility];  Service: Cardiovascular;  Laterality: N/A;  . POLYPECTOMY  11/06/2016   Procedure: POLYPECTOMY;  Surgeon: [name], MD;  Location: [facility];  Service: Endoscopy;;  . SHOULDER ARTHROSCOPY WITH OPEN ROTATOR CUFF REPAIR Right 07/18/2018   Procedure: SHOULDER ARTHROSCOPY WITH OPEN ROTATOR CUFF REPAIR;  Surgeon: Corky Mull, MD;  Location: [facility];  Service: Orthopedics;  Laterality: Right;    Social History   Tobacco Use  . Smoking status: Former Research scientist (life sciences)  . Smokeless tobacco: Never Used  . Tobacco comment: quit over 20 yrs ago  Vaping Use  . Vaping Use: Never used  Substance Use Topics  . Alcohol use: Yes    Alcohol/week: 0.0 standard drinks    Comment: 1-2 drinks/year  . Drug use: No     Medication list has been reviewed and updated.  Current Meds  Medication Sig  . acetaminophen (TYLENOL) 500 MG tablet Take 500 mg by mouth at bedtime. As needed/ otc  . aspirin EC 81 MG EC tablet Take 1 tablet (81 mg total) daily by mouth. (Patient taking differently: Take 81 mg by mouth. Takes 1 tablet Monday, Wednesday and Friday weekly.)  . atorvastatin (LIPITOR) 80 MG tablet Take 1 tablet (80 mg total) by mouth daily at 6 PM.  . clopidogrel (PLAVIX) 75 MG tablet Take 1 tablet (75 mg total) by mouth daily.  . diclofenac Sodium (VOLTAREN) 1 % GEL Apply 2 g topically 4 (four) times daily.  . fexofenadine (ALLEGRA) 180 MG tablet Take 180 mg by mouth as needed for allergies or rhinitis. otc  . hydrochlorothiazide (HYDRODIURIL) 25 MG tablet Take 1 tablet (25 mg total) by mouth daily.  . metoprolol tartrate (LOPRESSOR) 50 MG tablet Take 1 tablet (50 mg  total) by mouth 2 (two) times daily.  . nitroGLYCERIN (NITROSTAT) 0.4 MG SL tablet Place 1 tablet (0.4 mg total) under the tongue every 5 (five) minutes x 3 doses as needed for chest pain.  . ramipril (ALTACE) 10 MG capsule Take 1 capsule (10 mg total) by mouth 2 (two) times daily.    PHQ 2/9 Scores 06/09/2020 12/16/2019 11/10/2019 12/12/2018  PHQ - 2 Score 0 0 0 0  PHQ- 9 Score 0 1 - 1    GAD 7 : Generalized Anxiety Score 06/09/2020 12/16/2019  Nervous, Anxious, on Edge 0 0  Control/stop worrying 0 0  Worry too much - different things 0 0  Trouble relaxing 0 0  Restless 0 0  Easily annoyed or irritable 0 0  Afraid - awful might happen 0 0  Total GAD 7 Score 0 0    BP Readings from Last 3 Encounters:  06/09/20 130/80  12/16/19 130/80  10/20/19 120/80    Physical Exam Vitals and nursing note reviewed.  HENT:     Head: Normocephalic.  Jaw: There is normal jaw occlusion.     Right Ear: Tympanic membrane, ear canal and external ear normal.     Left Ear: Tympanic membrane, ear canal and external ear normal.     Nose:     Right Sinus: Maxillary sinus tenderness and frontal sinus tenderness present.     Left Sinus: Maxillary sinus tenderness and frontal sinus tenderness present.  Eyes:     General: No scleral icterus.       Right eye: No discharge.        Left eye: No discharge.     Conjunctiva/sclera: Conjunctivae normal.     Pupils: Pupils are equal, round, and reactive to light.  Neck:     Thyroid: No thyroid mass, thyromegaly or thyroid tenderness.     Vascular: No JVD.     Trachea: Trachea normal. No tracheal deviation.  Cardiovascular:     Rate and Rhythm: Normal rate and regular rhythm.     Heart sounds: Normal heart sounds. No murmur heard.  No friction rub. No gallop.   Pulmonary:     Effort: No respiratory distress.     Breath sounds: Examination of the right-middle field reveals decreased breath sounds. Examination of the right-lower field reveals decreased  breath sounds. Decreased breath sounds present. No wheezing, rhonchi or rales.  Abdominal:     General: Bowel sounds are normal.     Palpations: Abdomen is soft. There is no mass.     Tenderness: There is no abdominal tenderness. There is no guarding or rebound.  Musculoskeletal:        General: No tenderness. Normal range of motion.     Cervical back: Normal range of motion and neck supple.  Lymphadenopathy:     Cervical: No cervical adenopathy.     Right cervical: No superficial, deep or posterior cervical adenopathy.    Left cervical: No superficial, deep or posterior cervical adenopathy.  Skin:    General: Skin is warm.     Findings: No rash.  Neurological:     Mental Status: He is alert and oriented to person, place, and time.     Cranial Nerves: No cranial nerve deficit.     Deep Tendon Reflexes: Reflexes are normal and symmetric.     Wt Readings from Last 3 Encounters:  06/09/20 189 lb (85.7 kg)  12/16/19 212 lb (96.2 kg)  11/10/19 205 lb (93 kg)    BP 130/80   Pulse 80   Temp 98.2 F (36.8 C) (Oral)   Ht 5\' 9"  (1.753 m)   Wt 189 lb (85.7 kg)   BMI 27.91 kg/m   Assessment and Plan: 1. Acute non-recurrent maxillary sinusitis Acute.  Exam and history is consistent with maxillary sinusitis.  Will initiate Augmentin 875 mg twice a day. - amoxicillin-clavulanate (AUGMENTIN) 875-125 MG tablet; Take 1 tablet by mouth 2 (two) times daily.  Dispense: 20 tablet; Refill: 0  2. Cough Acute.  Persistent.  Will treat symptomatically with Robitussin AC 1 teaspoon every 6 hours.  However we will obtain a chest x-ray because there is decreased breath sounds in the right lower lobe and possibly the right middle lobe. - DG Chest 2 View; Future - amoxicillin-clavulanate (AUGMENTIN) 875-125 MG tablet; Take 1 tablet by mouth 2 (two) times daily.  Dispense: 20 tablet; Refill: 0 - guaiFENesin-codeine (ROBITUSSIN AC) 100-10 MG/5ML syrup; Take 5 mLs by mouth 4 (four) times daily as needed  for cough.  Dispense: 118 mL; Refill: 0

## 2020-06-09 NOTE — Telephone Encounter (Unsigned)
Copied from [place] [id number]. Topic: General - Other >> Jun 09, 2020 10:34 AM Antonieta Iba C wrote: Reason for CRM: pt and spouse Arbie Cookey called in to schedule visit with PCP for pt. Pt is currently experiencing a cough and sore throat, no fever, no chills, no body aches.  Completed screening, should pt have in office or virtual?   Please advise.

## 2020-06-17 ENCOUNTER — Other Ambulatory Visit: Payer: Self-pay

## 2020-06-17 ENCOUNTER — Ambulatory Visit (INDEPENDENT_AMBULATORY_CARE_PROVIDER_SITE_OTHER): Payer: Medicare HMO | Admitting: Family Medicine

## 2020-06-17 ENCOUNTER — Encounter: Payer: Self-pay | Admitting: Family Medicine

## 2020-06-17 VITALS — BP 130/94 | HR 64 | Ht 69.0 in | Wt 190.0 lb

## 2020-06-17 DIAGNOSIS — R059 Cough, unspecified: Secondary | ICD-10-CM

## 2020-06-17 DIAGNOSIS — I10 Essential (primary) hypertension: Secondary | ICD-10-CM | POA: Diagnosis not present

## 2020-06-17 DIAGNOSIS — R69 Illness, unspecified: Secondary | ICD-10-CM

## 2020-06-17 DIAGNOSIS — R05 Cough: Secondary | ICD-10-CM

## 2020-06-17 DIAGNOSIS — E785 Hyperlipidemia, unspecified: Secondary | ICD-10-CM | POA: Diagnosis not present

## 2020-06-17 DIAGNOSIS — I2510 Atherosclerotic heart disease of native coronary artery without angina pectoris: Secondary | ICD-10-CM

## 2020-06-17 MED ORDER — ATORVASTATIN CALCIUM 80 MG PO TABS: 80.0000 mg | ORAL_TABLET | Freq: Every day | ORAL | 1 refills | Status: DC

## 2020-06-17 MED ORDER — METOPROLOL TARTRATE 50 MG PO TABS: 50.0000 mg | ORAL_TABLET | Freq: Two times a day (BID) | ORAL | 1 refills | Status: DC

## 2020-06-17 MED ORDER — LOSARTAN POTASSIUM 50 MG PO TABS: 50.0000 mg | ORAL_TABLET | Freq: Every day | ORAL | 1 refills | Status: DC

## 2020-06-17 MED ORDER — CLOPIDOGREL BISULFATE 75 MG PO TABS: 75.0000 mg | ORAL_TABLET | Freq: Every day | ORAL | 1 refills | Status: DC

## 2020-06-17 MED ORDER — GUAIFENESIN-CODEINE 100-10 MG/5ML PO SYRP: 5.0000 mL | ORAL_SOLUTION | Freq: Four times a day (QID) | ORAL | 0 refills | Status: DC | PRN

## 2020-06-17 MED ORDER — HYDROCHLOROTHIAZIDE 25 MG PO TABS: 25.0000 mg | ORAL_TABLET | Freq: Every day | ORAL | 1 refills | Status: DC

## 2020-06-17 MED ORDER — AZITHROMYCIN 250 MG PO TABS: ORAL_TABLET | ORAL | 0 refills | Status: DC

## 2020-06-17 MED ORDER — LOSARTAN POTASSIUM 50 MG PO TABS
50.0000 mg | ORAL_TABLET | Freq: Every day | ORAL | 1 refills | Status: DC
Start: 2020-06-17 — End: 2020-12-17

## 2020-06-17 NOTE — Addendum Note (Signed)
Addended by: Juline Patch on: 06/17/2020 02:01 PM   Modules accepted: Orders

## 2020-06-17 NOTE — Progress Notes (Addendum)
Date:  06/17/2020   Name:  George Doyle   DOB:  1951-05-16   MRN:  [id number]   Chief Complaint: Hypertension, Hyperlipidemia, myocardial infarction (takes plavix for this), and Cough (is still taking Augmentin, but is out of cough syrup- would like a refill on syrup)  Hypertension This is a chronic problem. The current episode started more than 1 year ago. The problem has been gradually improving since onset. The problem is controlled. Pertinent negatives include no anxiety, blurred vision, chest pain, headaches, malaise/fatigue, neck pain, orthopnea, palpitations, peripheral edema, PND, shortness of breath or sweats. Past treatments include beta blockers, diuretics and angiotensin blockers. The current treatment provides moderate improvement. There are no compliance problems.  There is no history of angina, kidney disease, CAD/MI, CVA, heart failure, left ventricular hypertrophy, PVD or retinopathy. There is no history of chronic renal disease, a hypertension causing med or renovascular disease.  Hyperlipidemia This is a chronic problem. The current episode started more than 1 year ago. The problem is controlled. Recent lipid tests were reviewed and are normal. He has no history of chronic renal disease, diabetes, hypothyroidism, liver disease, obesity or nephrotic syndrome. Pertinent negatives include no chest pain, myalgias or shortness of breath. Current antihyperlipidemic treatment includes statins. The current treatment provides moderate improvement of lipids.  Heart Problem This is a chronic (CAD) problem. The current episode started more than 1 year ago. Pertinent negatives include no abdominal pain, chest pain, chills, coughing, fever, headaches, myalgias, nausea, neck pain, rash or sore throat.    Lab Results  Component Value Date   CREATININE 1.12 12/16/2019   BUN 17 12/16/2019   NA 137 12/16/2019   K 4.3 12/16/2019   CL 100 12/16/2019   CO2 26 12/16/2019   Lab  Results  Component Value Date   CHOL 109 12/16/2019   HDL 50 12/16/2019   LDLCALC 45 12/16/2019   TRIG 67 12/16/2019   CHOLHDL 2.1 06/11/2018   No results found for: TSH No results found for: HGBA1C Lab Results  Component Value Date   WBC 7.4 12/16/2019   HGB 14.1 12/16/2019   HCT 41.0 12/16/2019   MCV 90 12/16/2019   PLT 211 12/16/2019   Lab Results  Component Value Date   ALT 9 12/16/2019   AST 19 12/16/2019   ALKPHOS 85 12/16/2019   BILITOT 0.8 12/16/2019     Review of Systems  Constitutional: Negative for chills, fever and malaise/fatigue.  HENT: Negative for drooling, ear discharge, ear pain and sore throat.   Eyes: Negative for blurred vision.  Respiratory: Negative for cough, shortness of breath and wheezing.   Cardiovascular: Negative for chest pain, palpitations, orthopnea, leg swelling and PND.  Gastrointestinal: Negative for abdominal pain, blood in stool, constipation, diarrhea and nausea.  Endocrine: Negative for polydipsia.  Genitourinary: Negative for dysuria, frequency, hematuria and urgency.  Musculoskeletal: Negative for back pain, myalgias and neck pain.  Skin: Negative for rash.  Allergic/Immunologic: Negative for environmental allergies.  Neurological: Negative for dizziness and headaches.  Hematological: Does not bruise/bleed easily.  Psychiatric/Behavioral: Negative for suicidal ideas. The patient is not nervous/anxious.     Patient Active Problem List   Diagnosis Date Noted  . Benign neoplasm of skin of left forearm 01/27/2019  . Pure hypercholesterolemia 06/21/2018  . Poorly-controlled hypertension 06/21/2018  . Seizure disorder ([place]) 06/21/2018  . Hill-Sachs lesion of right shoulder 06/19/2018  . Injury of tendon of long head of right biceps 06/19/2018  . Rotator cuff tendinitis,  right 06/19/2018  . Traumatic complete tear of right rotator cuff 06/19/2018  . Headache disorder 05/09/2018  . CAD (coronary artery disease), native coronary  artery 03/28/2018  . History of seizures 03/28/2018  . Subdural hematoma ([place]) 03/27/2018  . Syncope 03/27/2018  . Old MI (myocardial infarction) 08/30/2017  . CAD, multiple vessel 08/01/2017  . NSTEMI (non-ST elevated myocardial infarction) ([place]) 07/20/2017  . Hyperlipidemia 05/10/2017  . Preop cardiovascular exam   . Benign neoplasm of ascending colon   . Polyp of sigmoid colon   . Essential hypertension 03/03/2016    No Known Allergies  Past Surgical History:  Procedure Laterality Date  . BICEPT TENODESIS Right 07/18/2018   Procedure: BICEPS TENODESIS;  Surgeon: Corky Mull, MD;  Location: [facility];  Service: Orthopedics;  Laterality: Right;  . COLONOSCOPY  2011   cleared for 10 yrs  . COLONOSCOPY WITH PROPOFOL N/A 11/06/2016   Procedure: COLONOSCOPY WITH PROPOFOL;  Surgeon: [name], MD;  Location: [place];  Service: Endoscopy;  Laterality: N/A;  . CORONARY STENT INTERVENTION N/A 07/23/2017   Procedure: CORONARY STENT INTERVENTION;  Surgeon: Yolonda Kida, MD;  Location: [place] [facility];  Service: Cardiovascular;  Laterality: N/A;  . CYST EXCISION     Forearm  . HERNIA REPAIR    . KNEE SURGERY Bilateral    2 on R) 1 on L)  . LEFT HEART CATH AND CORONARY ANGIOGRAPHY N/A 07/23/2017   Procedure: LEFT HEART CATH AND CORONARY ANGIOGRAPHY;  Surgeon: Minna Merritts, MD;  Location: [place] [facility];  Service: Cardiovascular;  Laterality: N/A;  . POLYPECTOMY  11/06/2016   Procedure: POLYPECTOMY;  Surgeon: [name], MD;  Location: [place];  Service: Endoscopy;;  . SHOULDER ARTHROSCOPY WITH OPEN ROTATOR CUFF REPAIR Right 07/18/2018   Procedure: SHOULDER ARTHROSCOPY WITH OPEN ROTATOR CUFF REPAIR;  Surgeon: Corky Mull, MD;  Location: [facility];  Service: Orthopedics;  Laterality: Right;    Social History   Tobacco Use  . Smoking status: Former Research scientist (life sciences)  . Smokeless tobacco: Never Used  . Tobacco comment: quit over 20 yrs ago  Vaping Use   . Vaping Use: Never used  Substance Use Topics  . Alcohol use: Yes    Alcohol/week: 0.0 standard drinks    Comment: 1-2 drinks/year  . Drug use: No     Medication list has been reviewed and updated.  Current Meds  Medication Sig  . acetaminophen (TYLENOL) 500 MG tablet Take 500 mg by mouth at bedtime. As needed/ otc  . aspirin EC 81 MG EC tablet Take 1 tablet (81 mg total) daily by mouth. (Patient taking differently: Take 81 mg by mouth. Takes 1 tablet Monday, Wednesday and Friday weekly.)  . atorvastatin (LIPITOR) 80 MG tablet Take 1 tablet (80 mg total) by mouth daily at 6 PM.  . clopidogrel (PLAVIX) 75 MG tablet Take 1 tablet (75 mg total) by mouth daily.  . fexofenadine (ALLEGRA) 180 MG tablet Take 180 mg by mouth as needed for allergies or rhinitis. otc  . guaiFENesin-codeine (ROBITUSSIN AC) 100-10 MG/5ML syrup Take 5 mLs by mouth 4 (four) times daily as needed for cough.  . hydrochlorothiazide (HYDRODIURIL) 25 MG tablet Take 1 tablet (25 mg total) by mouth daily.  . metoprolol tartrate (LOPRESSOR) 50 MG tablet Take 1 tablet (50 mg total) by mouth 2 (two) times daily.  . nitroGLYCERIN (NITROSTAT) 0.4 MG SL tablet Place 1 tablet (0.4 mg total) under the tongue every 5 (five) minutes x  3 doses as needed for chest pain.  . [DISCONTINUED] atorvastatin (LIPITOR) 80 MG tablet Take 1 tablet (80 mg total) by mouth daily at 6 PM.  . [DISCONTINUED] clopidogrel (PLAVIX) 75 MG tablet Take 1 tablet (75 mg total) by mouth daily.  . [DISCONTINUED] guaiFENesin-codeine (ROBITUSSIN AC) 100-10 MG/5ML syrup Take 5 mLs by mouth 4 (four) times daily as needed for cough.  . [DISCONTINUED] hydrochlorothiazide (HYDRODIURIL) 25 MG tablet Take 1 tablet (25 mg total) by mouth daily.  . [DISCONTINUED] metoprolol tartrate (LOPRESSOR) 50 MG tablet Take 1 tablet (50 mg total) by mouth 2 (two) times daily.  . [DISCONTINUED] ramipril (ALTACE) 10 MG capsule Take 1 capsule (10 mg total) by mouth 2 (two) times daily.      PHQ 2/9 Scores 06/09/2020 12/16/2019 11/10/2019 12/12/2018  PHQ - 2 Score 0 0 0 0  PHQ- 9 Score 0 1 - 1    GAD 7 : Generalized Anxiety Score 06/09/2020 12/16/2019  Nervous, Anxious, on Edge 0 0  Control/stop worrying 0 0  Worry too much - different things 0 0  Trouble relaxing 0 0  Restless 0 0  Easily annoyed or irritable 0 0  Afraid - awful might happen 0 0  Total GAD 7 Score 0 0    BP Readings from Last 3 Encounters:  06/17/20 (!) 130/94  06/09/20 130/80  12/16/19 130/80    Physical Exam Vitals and nursing note reviewed.  HENT:     Head: Normocephalic.     Right Ear: External ear normal.     Left Ear: External ear normal.     Nose: Nose normal.  Eyes:     General: No scleral icterus.       Right eye: No discharge.        Left eye: No discharge.     Conjunctiva/sclera: Conjunctivae normal.     Pupils: Pupils are equal, round, and reactive to light.  Neck:     Thyroid: No thyromegaly.     Vascular: No JVD.     Trachea: No tracheal deviation.  Cardiovascular:     Rate and Rhythm: Normal rate and regular rhythm.     Heart sounds: Normal heart sounds. No murmur heard.  No friction rub. No gallop.   Pulmonary:     Effort: No respiratory distress.     Breath sounds: Normal breath sounds. No wheezing or rales.  Abdominal:     General: Bowel sounds are normal.     Palpations: Abdomen is soft. There is no mass.     Tenderness: There is no abdominal tenderness. There is no guarding or rebound.  Musculoskeletal:        General: No tenderness. Normal range of motion.     Cervical back: Normal range of motion and neck supple.  Lymphadenopathy:     Cervical: No cervical adenopathy.  Skin:    General: Skin is warm.     Findings: No rash.  Neurological:     Mental Status: He is alert and oriented to person, place, and time.     Cranial Nerves: No cranial nerve deficit.     Deep Tendon Reflexes: Reflexes are normal and symmetric.     Wt Readings from Last 3  Encounters:  06/17/20 190 lb (86.2 kg)  06/09/20 189 lb (85.7 kg)  12/16/19 212 lb (96.2 kg)    BP (!) 130/94   Pulse 64   Ht 5\' 9"  (1.753 m)   Wt 190 lb (86.2 kg)   BMI 28.06  kg/m   Assessment and Plan: 1. Essential hypertension Chronic.  Controlled.  Stable.  Continue hydrochlorothiazide 25 mg once a day Lopressor 50 mg twice a day. - hydrochlorothiazide (HYDRODIURIL) 25 MG tablet; Take 1 tablet (25 mg total) by mouth daily.  Dispense: 90 tablet; Refill: 1 - metoprolol tartrate (LOPRESSOR) 50 MG tablet; Take 1 tablet (50 mg total) by mouth 2 (two) times daily.  Dispense: 180 tablet; Refill: 1  2. Hyperlipidemia, unspecified hyperlipidemia type Chronic.  Controlled.  Stable.  Continue atorvastatin 80 mg once a day. - atorvastatin (LIPITOR) 80 MG tablet; Take 1 tablet (80 mg total) by mouth daily at 6 PM.  Dispense: 90 tablet; Refill: 1  3. CAD, multiple vessel Chronic.  Controlled.  Stable.  Controlled with controlling her blood pressure, controlling of lipids, and antiplatelet therapy.  We will refill platelet inhibition Plavix 75 mg once a day. - clopidogrel (PLAVIX) 75 MG tablet; Take 1 tablet (75 mg total) by mouth daily.  Dispense: 90 tablet; Refill: 1  4. Cough Persistent cough is probably secondary to bronchitis will initiate Robitussin-AC 1 teaspoon every 6 hours as needed. - guaiFENesin-codeine (ROBITUSSIN AC) 100-10 MG/5ML syrup; Take 5 mLs by mouth 4 (four) times daily as needed for cough.  Dispense: 118 mL; Refill: 0  5. Taking medication for chronic disease Patient is on Plavix and we will check CBC for ruling out platelet abnormalities. - CBC w/Diff/Platelet

## 2020-06-18 LAB — CBC WITH DIFFERENTIAL/PLATELET
Basophils Absolute: 0.1 10*3/uL (ref 0.0–0.2)
Basos: 1 %
EOS (ABSOLUTE): 0.4 10*3/uL (ref 0.0–0.4)
Eos: 4 %
Hematocrit: 40.8 % (ref 37.5–51.0)
Hemoglobin: 13.9 g/dL (ref 13.0–17.7)
Immature Grans (Abs): 0 10*3/uL (ref 0.0–0.1)
Immature Granulocytes: 0 %
Lymphocytes Absolute: 1.9 10*3/uL (ref 0.7–3.1)
Lymphs: 20 %
MCH: 30.5 pg (ref 26.6–33.0)
MCHC: 34.1 g/dL (ref 31.5–35.7)
MCV: 90 fL (ref 79–97)
Monocytes Absolute: 0.7 10*3/uL (ref 0.1–0.9)
Monocytes: 7 %
Neutrophils Absolute: 6.5 10*3/uL (ref 1.4–7.0)
Neutrophils: 68 %
Platelets: 246 10*3/uL (ref 150–450)
RBC: 4.55 x10E6/uL (ref 4.14–5.80)
RDW: 12.4 % (ref 11.6–15.4)
WBC: 9.5 10*3/uL (ref 3.4–10.8)

## 2020-10-19 ENCOUNTER — Encounter: Payer: Self-pay | Admitting: Cardiovascular Disease

## 2020-10-19 ENCOUNTER — Ambulatory Visit: Payer: Medicare HMO | Admitting: Cardiovascular Disease

## 2020-10-19 ENCOUNTER — Other Ambulatory Visit: Payer: Self-pay

## 2020-10-19 VITALS — BP 140/90 | HR 64 | Ht 69.0 in | Wt 216.1 lb

## 2020-10-19 DIAGNOSIS — I10 Essential (primary) hypertension: Secondary | ICD-10-CM | POA: Diagnosis not present

## 2020-10-19 DIAGNOSIS — I25118 Atherosclerotic heart disease of native coronary artery with other forms of angina pectoris: Secondary | ICD-10-CM | POA: Diagnosis not present

## 2020-10-19 DIAGNOSIS — E782 Mixed hyperlipidemia: Secondary | ICD-10-CM | POA: Diagnosis not present

## 2020-10-19 MED ORDER — ALBUTEROL SULFATE HFA 108 (90 BASE) MCG/ACT IN AERS: 1.0000 | INHALATION_SPRAY | Freq: Four times a day (QID) | RESPIRATORY_TRACT | 2 refills | Status: DC | PRN

## 2020-10-19 NOTE — Progress Notes (Signed)
Cardiology Office Note  Date:  10/19/2020   ID:  George Doyle, DOB [birth year], MRN [id number]  PCP:  Juline Patch, MD   Chief Complaint  Patient presents with  . OTHER    12 month f/u no complaints today. Meds reviewed verbally with pt.    HPI:  George Doyle is a pleasant 70 yo male with history of  Seizure disorder 2005 in July 2019 HTN  former tobacco abuse  admitted with chest pain 07/21/2017 NSTEMI, TNT 14 Cath 07/23/2017  , Occluded proximal RCA, collaterals from left to right, Moderate to severe mid Left circumflex dz, tortuous vessel, calcified Moderate inferior wall hypokinesis  attempt PCI to the RCA was unsuccessful, possibly went subintimal Seen by Dr. Irish Lack August 30, 2017 Medical management recommended of his occluded RCA He presents for follow-up of his coronary artery disease, stable angina, preop cardiovascular  Last seen in clinic by myself February 2021 At that time sedentary, no exercise program He had mild shortness of breath possibly secondary to higher weight, conditioning, poor diet  Bronchitis infection, Managed by PMD ramapril stopped Now on losartan 50 daily, HCTZ 25, metoprolol  Denies chest pain concerning for angina Asymptomatic bradycardia No seizures  Completed shoulder surgery with Dr. Roland Rack  Knee pain,  Hip popping  EKG personally reviewed by myself on todays visit Shows normal sinus rhythm rate 64 bpm ST abnormality V4 through V6, 1 and aVL old inferior MI No significant change compared to prior EKGs   Discussed prior events with him  emergency room March 27, 2018 for seizure Hospital records reviewed with the patient in detail  full body tonic-clonic seizure-like activity, hurt his right shoulder at that time CT scan head subdural hematoma, Received IV Keppra.   was transferred to [facility]  Echo November 2018  ejection fraction was in the range of 50% to 55%. Aorta: Aortic root was mildly dilated, 3.6  cm - Ascending aorta: The ascending aorta was mildly dilated, 4.1 cm   PMH:   has a past medical history of Allergy, Arthritis, Hypertension, Myocardial infarction ([place]) (07/2017), Pneumonia, and Seizures ([place]).  PSH:    Past Surgical History:  Procedure Laterality Date  . BICEPT TENODESIS Right 07/18/2018   Procedure: BICEPS TENODESIS;  Surgeon: Corky Mull, MD;  Location: [facility];  Service: Orthopedics;  Laterality: Right;  . COLONOSCOPY  2011   cleared for 10 yrs  . COLONOSCOPY WITH PROPOFOL N/A 11/06/2016   Procedure: COLONOSCOPY WITH PROPOFOL;  Surgeon: [name], MD;  Location: [place];  Service: Endoscopy;  Laterality: N/A;  . CORONARY STENT INTERVENTION N/A 07/23/2017   Procedure: CORONARY STENT INTERVENTION;  Surgeon: Yolonda Kida, MD;  Location: [place] [facility];  Service: Cardiovascular;  Laterality: N/A;  . CYST EXCISION     Forearm  . HERNIA REPAIR    . KNEE SURGERY Bilateral    2 on R) 1 on L)  . LEFT HEART CATH AND CORONARY ANGIOGRAPHY N/A 07/23/2017   Procedure: LEFT HEART CATH AND CORONARY ANGIOGRAPHY;  Surgeon: Minna Merritts, MD;  Location: [place] [facility];  Service: Cardiovascular;  Laterality: N/A;  . POLYPECTOMY  11/06/2016   Procedure: POLYPECTOMY;  Surgeon: [name], MD;  Location: [place];  Service: Endoscopy;;  . SHOULDER ARTHROSCOPY WITH OPEN ROTATOR CUFF REPAIR Right 07/18/2018   Procedure: SHOULDER ARTHROSCOPY WITH OPEN ROTATOR CUFF REPAIR;  Surgeon: Corky Mull, MD;  Location: [facility];  Service: Orthopedics;  Laterality: Right;  Current Outpatient Medications  Medication Sig Dispense Refill  . acetaminophen (TYLENOL) 500 MG tablet Take 500 mg by mouth at bedtime. As needed/ otc    . aspirin EC 81 MG EC tablet Take 1 tablet (81 mg total) daily by mouth. (Patient taking differently: Take 81 mg by mouth as needed.) 30 tablet 1  . atorvastatin (LIPITOR) 80 MG tablet Take 1 tablet (80 mg total) by mouth  daily at 6 PM. 90 tablet 1  . clopidogrel (PLAVIX) 75 MG tablet Take 1 tablet (75 mg total) by mouth daily. 90 tablet 1  . diclofenac Sodium (VOLTAREN) 1 % GEL Apply 2 g topically 4 (four) times daily. 50 g 6  . fexofenadine (ALLEGRA) 180 MG tablet Take 180 mg by mouth as needed for allergies or rhinitis. otc    . hydrochlorothiazide (HYDRODIURIL) 25 MG tablet Take 1 tablet (25 mg total) by mouth daily. 90 tablet 1  . losartan (COZAAR) 50 MG tablet Take 1 tablet (50 mg total) by mouth daily. 90 tablet 1  . metoprolol tartrate (LOPRESSOR) 50 MG tablet Take 1 tablet (50 mg total) by mouth 2 (two) times daily. 180 tablet 1  . nitroGLYCERIN (NITROSTAT) 0.4 MG SL tablet Place 1 tablet (0.4 mg total) under the tongue every 5 (five) minutes x 3 doses as needed for chest pain. 30 tablet 2   No current facility-administered medications for this visit.    Allergies:   Patient has no known allergies.   Social History:  The patient  reports that he has quit smoking. He has never used smokeless tobacco. He reports current alcohol use. He reports that he does not use drugs.   Family History:   family history includes CAD in his father; Heart failure in his mother; Lung cancer in his father.   Review of Systems: Review of Systems  Constitutional: Negative.   Respiratory: Positive for shortness of breath.   Cardiovascular: Negative.   Gastrointestinal: Negative.   Musculoskeletal: Negative.   Neurological: Negative.   Psychiatric/Behavioral: Negative.   All other systems reviewed and are negative.  PHYSICAL EXAM: VS:  BP 140/90 (BP Location: Left Arm, Patient Position: Sitting, Cuff Size: Normal)   Pulse 64   Ht 5\' 9"  (1.753 m)   Wt 216 lb 2 oz (98 kg)   SpO2 98%   BMI 31.92 kg/m  , BMI Body mass index is 31.92 kg/m. Constitutional:  oriented to person, place, and time. No distress.  HENT:  Head: Grossly normal Eyes:  no discharge. No scleral icterus.  Neck: No JVD, no carotid bruits   Cardiovascular: Regular rate and rhythm, no murmurs appreciated Pulmonary/Chest: Clear to auscultation bilaterally, no wheezes or rails Abdominal: Soft.  no distension.  no tenderness.  Musculoskeletal: Normal range of motion Neurological:  normal muscle tone. Coordination normal. No atrophy Skin: Skin warm and dry Psychiatric: normal affect, pleasant   Recent Labs: 12/16/2019: ALT 9; BUN 17; Creatinine, Ser 1.12; Potassium 4.3; Sodium 137 06/17/2020: Hemoglobin 13.9; Platelets 246    Lipid Panel Lab Results  Component Value Date   CHOL 109 12/16/2019   HDL 50 12/16/2019   LDLCALC 45 12/16/2019   TRIG 67 12/16/2019    Wt Readings from Last 3 Encounters:  10/19/20 216 lb 2 oz (98 kg)  06/17/20 190 lb (86.2 kg)  06/09/20 189 lb (85.7 kg)      ASSESSMENT AND PLAN:  Essential hypertension  Monitor blood pressure at home, call with numbers Cough resolved  CAD, multiple vessel Moderate  to severe left circumflex disease tortuous vessel, occluded RCA  stable angina symptoms,  Chronic shortness of breath, Prior catheterization November 2018 Occluded RCA collaterals from left to right Also 60% proximal to mid left circumflex disease Long discussion concerning stress testing if chest pain  Mixed hyperlipidemia Cholesterol is at goal on the current lipid regimen. No changes to the medications were made.  Seizures Previous hospital admissions discussed with him from July 2019 No  seizures, off medication     Total encounter time more than 25 minutes  Greater than 50% was spent in counseling and coordination of care with the patient  No orders of the defined types were placed in this encounter.    Signed, Esmond Plants, M.D., Ph.D. 10/19/2020  [place], [place]

## 2020-10-19 NOTE — Patient Instructions (Addendum)
Medication Instructions:  Albuterol inhaler as needed (sent in already)  Lab work: No new labs needed  Testing/Procedures: No new testing needed   Follow-Up: At [facility], you and your health needs are our priority.  As part of our continuing mission to provide you with exceptional heart care, we have created designated Provider Care Teams.  These Care Teams include your primary Cardiologist (physician) and Advanced Practice Providers (APPs -  Physician Assistants and Nurse Practitioners) who all work together to provide you with the care you need, when you need it.  . You will need a follow up appointment in 6 months  . Providers on your designated Care Team:   . Murray Hodgkins, NP . Christell Faith, PA-C . Marrianne Mood, PA-C  Please monitor blood pressures and keep a log of your readings.  Goal blood pressure 130s on the top 80s on the bottom number  How to use a home blood pressure monitor. . Be still. . Measure at the same time every day. It's important to take the readings at the same time each day, such as morning and evening. Take reading approximately 1 1/2 to 2 hours after BP medications.   AVOID these things for 30 minutes before checking your blood pressure:  Drinking caffeine.  Drinking alcohol.  Eating.  Smoking.  Exercising.   Five minutes before checking your blood pressure:  Pee.  Sit in a dining chair. Avoid sitting in a soft couch or armchair.  Be quiet. Do not talk.       Sit correctly. Sit with your back straight and supported (on a dining chair, rather than a sofa). Your feet should be flat on the floor and your legs should not be crossed. Your arm should be supported on a flat surface (such as a table) with the upper arm at heart level. Make sure the bottom of the cuff is placed directly above the bend of the elbow.

## 2020-10-21 DIAGNOSIS — Z8616 Personal history of COVID-19: Secondary | ICD-10-CM

## 2020-10-21 HISTORY — DX: Personal history of COVID-19: Z86.16

## 2020-10-23 HISTORY — PX: LEFT HEART CATH AND CORONARY ANGIOGRAPHY: CATH118249

## 2020-11-05 ENCOUNTER — Ambulatory Visit (INDEPENDENT_AMBULATORY_CARE_PROVIDER_SITE_OTHER): Payer: Medicare HMO | Admitting: Family Medicine

## 2020-11-05 ENCOUNTER — Other Ambulatory Visit: Payer: Self-pay

## 2020-11-05 ENCOUNTER — Encounter: Payer: Self-pay | Admitting: Family Medicine

## 2020-11-05 VITALS — BP 140/90 | HR 72 | Ht 69.0 in | Wt 210.0 lb

## 2020-11-05 DIAGNOSIS — I10 Essential (primary) hypertension: Secondary | ICD-10-CM | POA: Diagnosis not present

## 2020-11-05 DIAGNOSIS — E785 Hyperlipidemia, unspecified: Secondary | ICD-10-CM

## 2020-11-05 DIAGNOSIS — D649 Anemia, unspecified: Secondary | ICD-10-CM

## 2020-11-05 DIAGNOSIS — I2510 Atherosclerotic heart disease of native coronary artery without angina pectoris: Secondary | ICD-10-CM | POA: Diagnosis not present

## 2020-11-05 DIAGNOSIS — Z09 Encounter for follow-up examination after completed treatment for conditions other than malignant neoplasm: Secondary | ICD-10-CM

## 2020-11-05 DIAGNOSIS — Z79899 Other long term (current) drug therapy: Secondary | ICD-10-CM

## 2020-11-05 DIAGNOSIS — G40909 Epilepsy, unspecified, not intractable, without status epilepticus: Secondary | ICD-10-CM

## 2020-11-05 DIAGNOSIS — Z5181 Encounter for therapeutic drug level monitoring: Secondary | ICD-10-CM

## 2020-11-05 NOTE — Progress Notes (Signed)
Date:  11/05/2020   Name:  George Doyle   DOB:  05/11/1951   MRN:  [id number]   Chief Complaint: Follow-up (Seizure- will be seeing neurologist in 4-6 months)  Pt [facility] on [facility]/3 and was discharged on 2/6. Transition of care call placed on 2/8.    Lab Results  Component Value Date   CREATININE 1.12 12/16/2019   BUN 17 12/16/2019   NA 137 12/16/2019   K 4.3 12/16/2019   CL 100 12/16/2019   CO2 26 12/16/2019   Lab Results  Component Value Date   CHOL 109 12/16/2019   HDL 50 12/16/2019   LDLCALC 45 12/16/2019   TRIG 67 12/16/2019   CHOLHDL 2.1 06/11/2018   No results found for: TSH No results found for: HGBA1C Lab Results  Component Value Date   WBC 9.5 06/17/2020   HGB 13.9 06/17/2020   HCT 40.8 06/17/2020   MCV 90 06/17/2020   PLT 246 06/17/2020   Lab Results  Component Value Date   ALT 9 12/16/2019   AST 19 12/16/2019   ALKPHOS 85 12/16/2019   BILITOT 0.8 12/16/2019     Review of Systems  Constitutional: Negative for chills and fever.  HENT: Negative for drooling, ear discharge, ear pain and sore throat.   Respiratory: Negative for cough, shortness of breath and wheezing.   Cardiovascular: Negative for chest pain, palpitations and leg swelling.  Gastrointestinal: Negative for abdominal pain, blood in stool, constipation, diarrhea and nausea.  Endocrine: Negative for polydipsia.  Genitourinary: Negative for dysuria, frequency, hematuria and urgency.  Musculoskeletal: Negative for back pain, myalgias and neck pain.  Skin: Negative for rash.  Allergic/Immunologic: Negative for environmental allergies.  Neurological: Negative for dizziness, speech difficulty, numbness and headaches.  Hematological: Does not bruise/bleed easily.  Psychiatric/Behavioral: Negative for suicidal ideas. The patient is not nervous/anxious.     Patient Active Problem List   Diagnosis Date Noted  . Benign neoplasm of skin of left  forearm 01/27/2019  . Pure hypercholesterolemia 06/21/2018  . Poorly-controlled hypertension 06/21/2018  . Seizure disorder ([place]) 06/21/2018  . Hill-Sachs lesion of right shoulder 06/19/2018  . Injury of tendon of long head of right biceps 06/19/2018  . Rotator cuff tendinitis, right 06/19/2018  . Traumatic complete tear of right rotator cuff 06/19/2018  . Headache disorder 05/09/2018  . CAD (coronary artery disease), native coronary artery 03/28/2018  . History of seizures 03/28/2018  . Subdural hematoma ([place]) 03/27/2018  . Syncope 03/27/2018  . Old MI (myocardial infarction) 08/30/2017  . CAD, multiple vessel 08/01/2017  . NSTEMI (non-ST elevated myocardial infarction) ([place]) 07/20/2017  . Hyperlipidemia 05/10/2017  . Preop cardiovascular exam   . Benign neoplasm of ascending colon   . Polyp of sigmoid colon   . Essential hypertension 03/03/2016    No Known Allergies  Past Surgical History:  Procedure Laterality Date  . BICEPT TENODESIS Right 07/18/2018   Procedure: BICEPS TENODESIS;  Surgeon: Corky Mull, MD;  Location: [facility];  Service: Orthopedics;  Laterality: Right;  . COLONOSCOPY  2011   cleared for 10 yrs  . COLONOSCOPY WITH PROPOFOL N/A 11/06/2016   Procedure: COLONOSCOPY WITH PROPOFOL;  Surgeon: [name], MD;  Location: [place];  Service: Endoscopy;  Laterality: N/A;  . CORONARY STENT INTERVENTION N/A 07/23/2017   Procedure: CORONARY STENT INTERVENTION;  Surgeon: Yolonda Kida, MD;  Location: [place] [facility];  Service: Cardiovascular;  Laterality: N/A;  . CYST EXCISION  Forearm  . HERNIA REPAIR    . KNEE SURGERY Bilateral    2 on R) 1 on L)  . LEFT HEART CATH AND CORONARY ANGIOGRAPHY N/A 07/23/2017   Procedure: LEFT HEART CATH AND CORONARY ANGIOGRAPHY;  Surgeon: Minna Merritts, MD;  Location: [place] [facility];  Service: Cardiovascular;  Laterality: N/A;  . POLYPECTOMY  11/06/2016   Procedure: POLYPECTOMY;  Surgeon: [name],  MD;  Location: [place];  Service: Endoscopy;;  . SHOULDER ARTHROSCOPY WITH OPEN ROTATOR CUFF REPAIR Right 07/18/2018   Procedure: SHOULDER ARTHROSCOPY WITH OPEN ROTATOR CUFF REPAIR;  Surgeon: Corky Mull, MD;  Location: [facility];  Service: Orthopedics;  Laterality: Right;    Social History   Tobacco Use  . Smoking status: Former Research scientist (life sciences)  . Smokeless tobacco: Never Used  . Tobacco comment: quit over 20 yrs ago  Vaping Use  . Vaping Use: Never used  Substance Use Topics  . Alcohol use: Yes    Alcohol/week: 0.0 standard drinks    Comment: 1-2 drinks/year  . Drug use: No     Medication list has been reviewed and updated.  Current Meds  Medication Sig  . acetaminophen (TYLENOL) 500 MG tablet Take 500 mg by mouth at bedtime. As needed/ otc  . albuterol (VENTOLIN HFA) 108 (90 Base) MCG/ACT inhaler Inhale 1-2 puffs into the lungs every 6 (six) hours as needed for wheezing or shortness of breath.  Marland Kitchen aspirin EC 81 MG EC tablet Take 1 tablet (81 mg total) daily by mouth. (Patient taking differently: Take 81 mg by mouth as needed.)  . atorvastatin (LIPITOR) 80 MG tablet Take 1 tablet (80 mg total) by mouth daily at 6 PM.  . clopidogrel (PLAVIX) 75 MG tablet Take 1 tablet (75 mg total) by mouth daily.  . diclofenac Sodium (VOLTAREN) 1 % GEL Apply 2 g topically 4 (four) times daily.  . fexofenadine (ALLEGRA) 180 MG tablet Take 180 mg by mouth as needed for allergies or rhinitis. otc  . levETIRAcetam (KEPPRA) 750 MG tablet Take 1 tablet by mouth in the morning and at bedtime.  Marland Kitchen losartan (COZAAR) 50 MG tablet Take 1 tablet (50 mg total) by mouth daily.  . metoprolol tartrate (LOPRESSOR) 50 MG tablet Take 1 tablet (50 mg total) by mouth 2 (two) times daily.  . nitroGLYCERIN (NITROSTAT) 0.4 MG SL tablet Place 1 tablet (0.4 mg total) under the tongue every 5 (five) minutes x 3 doses as needed for chest pain.  . potassium chloride SA (KLOR-CON) 20 MEQ tablet Take 1 tablet by mouth  daily.    PHQ 2/9 Scores 11/05/2020 06/09/2020 12/16/2019 11/10/2019  PHQ - 2 Score 0 0 0 0  PHQ- 9 Score 0 0 1 -    GAD 7 : Generalized Anxiety Score 11/05/2020 06/09/2020 12/16/2019  Nervous, Anxious, on Edge 0 0 0  Control/stop worrying 0 0 0  Worry too much - different things 0 0 0  Trouble relaxing 0 0 0  Restless 0 0 0  Easily annoyed or irritable 0 0 0  Afraid - awful might happen 0 0 0  Total GAD 7 Score 0 0 0    BP Readings from Last 3 Encounters:  11/05/20 140/90  10/19/20 140/90  06/17/20 (!) 130/94    Physical Exam Vitals and nursing note reviewed.  HENT:     Head: Normocephalic.     Right Ear: Tympanic membrane, ear canal and external ear normal.     Left Ear: Tympanic membrane,  ear canal and external ear normal.     Nose: Nose normal.     Mouth/Throat:     Mouth: Oropharynx is clear and moist.  Eyes:     General: No scleral icterus.       Right eye: No discharge.        Left eye: No discharge.     Extraocular Movements: EOM normal.     Conjunctiva/sclera: Conjunctivae normal.     Pupils: Pupils are equal, round, and reactive to light.  Neck:     Thyroid: No thyromegaly.     Vascular: No JVD.     Trachea: No tracheal deviation.  Cardiovascular:     Rate and Rhythm: Normal rate and regular rhythm.     Pulses: Normal pulses and intact distal pulses.     Heart sounds: Normal heart sounds. No murmur heard. No friction rub. No gallop.   Pulmonary:     Effort: No respiratory distress.     Breath sounds: Normal breath sounds. No wheezing or rales.  Abdominal:     General: Bowel sounds are normal.     Palpations: Abdomen is soft. There is no hepatosplenomegaly or mass.     Tenderness: There is no abdominal tenderness. There is no CVA tenderness, guarding or rebound.  Musculoskeletal:        General: No tenderness or edema. Normal range of motion.     Cervical back: Normal range of motion and neck supple.  Lymphadenopathy:     Cervical: No cervical  adenopathy.  Skin:    General: Skin is warm.     Capillary Refill: Capillary refill takes less than 2 seconds.     Coloration: Skin is not jaundiced or pale.     Findings: No bruising, erythema, lesion or rash.  Neurological:     Mental Status: He is alert and oriented to person, place, and time.     Cranial Nerves: No cranial nerve deficit.     Deep Tendon Reflexes: Strength normal and reflexes are normal and symmetric.     Wt Readings from Last 3 Encounters:  11/05/20 210 lb (95.3 kg)  10/19/20 216 lb 2 oz (98 kg)  06/17/20 190 lb (86.2 kg)    BP 140/90   Pulse 72   Ht 5\' 9"  (1.753 m)   Wt 210 lb (95.3 kg)   BMI 31.01 kg/m   Assessment and Plan:  [facility] Discharge Acute Issues Care Follow Up                                                                        Patient Demographics  Kartik Fernando, is a 70 y.o. male  DOB [birth year]  MRN [id number].  Primary MD  Juline Patch, MD   Reason for TCC follow Up -medication monitoring blood pressure recheck neurologic exam and follow-up referrals to neurology and cardiology.   Past Medical History:  Diagnosis Date  . Allergy   . Arthritis  knees, hands  . Hypertension   . Myocardial infarction ([place]) 07/2017  . Pneumonia   . Seizures ([place])    approx 1970's, 2005, June 2019  Testing showed no cause. Extended EEG scheduled for November 2019    Past Surgical History:  Procedure Laterality Date  . BICEPT TENODESIS Right 07/18/2018   Procedure: BICEPS TENODESIS;  Surgeon: Corky Mull, MD;  Location: [facility];  Service: Orthopedics;  Laterality: Right;  . COLONOSCOPY  2011   cleared for 10 yrs  . COLONOSCOPY WITH PROPOFOL N/A 11/06/2016   Procedure: COLONOSCOPY WITH PROPOFOL;  Surgeon: [name], MD;  Location: [place];  Service: Endoscopy;  Laterality: N/A;  . CORONARY STENT  INTERVENTION N/A 07/23/2017   Procedure: CORONARY STENT INTERVENTION;  Surgeon: Yolonda Kida, MD;  Location: [place] [facility];  Service: Cardiovascular;  Laterality: N/A;  . CYST EXCISION     Forearm  . HERNIA REPAIR    . KNEE SURGERY Bilateral    2 on R) 1 on L)  . LEFT HEART CATH AND CORONARY ANGIOGRAPHY N/A 07/23/2017   Procedure: LEFT HEART CATH AND CORONARY ANGIOGRAPHY;  Surgeon: Minna Merritts, MD;  Location: [place] [facility];  Service: Cardiovascular;  Laterality: N/A;  . POLYPECTOMY  11/06/2016   Procedure: POLYPECTOMY;  Surgeon: [name], MD;  Location: [place];  Service: Endoscopy;;  . SHOULDER ARTHROSCOPY WITH OPEN ROTATOR CUFF REPAIR Right 07/18/2018   Procedure: SHOULDER ARTHROSCOPY WITH OPEN ROTATOR CUFF REPAIR;  Surgeon: Corky Mull, MD;  Location: [facility];  Service: Orthopedics;  Laterality: Right;       Recent HPI and Hospital Course  No change in patient's status since discharge from hospital.  Patient been stable with no seizure disorder chest pain or congestive heart failure concerns.  [facility] Acute Care Issue to be followed in the Clinic   None   Subjective:   Durwin Reges today has, No headache, No chest pain, No abdominal pain - No Nausea, No new weakness tingling or numbness, No Cough - SOB.   Assessment & Plan    1. Hospital discharge follow-up Hospital discharge follow-up to [facility] in [place] on 10/21/2020 for seizure disorder and elevated troponin level.  Patient was discharged on 10/24/2020 with a transition of care call on 10/26/2020.  Patient is doing well upon discharge with the following changes below.  2. Seizure disorder [facility]) Patient in 2019 was transitioned off his Keppra and has done well with no seizure activity until most recently this year when he had a breakthrough seizure.  He was evaluated and an MRI was done with some abnormalities that are going to need to be repeated  in a 4 to 6-week.  Per neurology note.  Patient is tolerating Keppra but is a little higher dose than when he was last taken off that was 500 twice a day and we will do a Keppra level as noted below.  We will refer to his neurologist in [facility] for follow-up of his seizure management and MRI follow-up as well.  3. CAD, multiple vessel Patient is followed by Dr. Rockey Situ and is doing well but was noted to have an elevation of the troponins which I think is most likely due to the stress that was placed upon his heart during the seizure and subsequent transport.  Patient is doing well on current therapy of Plavix, Lipitor, Lopressor, losartan, and metoprolol.  We will refer to Dr. Rockey Situ for posthospitalization follow-up.  4. Essential hypertension Chronic.  Almost controlled at 140/90.  Patient had his hydrochlorothiazide discontinued because of hypokalemia and then had a hive-like rash of unknown origin.  First step was to discontinue his potassium supplementation and albuterol which I do not think is the issue and have asked patient if he has another hive-like circumstance to look back at his previous meal to see if there is any shellfish knots or other issues that could be causing this.  In the meantime we will have patient return in 4 to 6 weeks at which time we will repeat his blood pressure and if still elevated will make accordingly changes unless Dr. Zollie Pee has seen him before I. - Renal Function Panel  5. Hyperlipidemia, unspecified hyperlipidemia type Patient will continue Lipitor at current dosing  6. Anemia, unspecified type It was noted to the hemoglobin was slightly decreased but it was in normal range upon discharge and we will repeat a CBC to see if this is continued to be so. - CBC with Differential/Platelet  7. Encounter for monitoring anticonvulsant therapy Patient has recently been restarted and he is on twice a day dosing that he takes around 9-10 February at about mid level peak  to trough and we will check current level.  Available when he has his neurology appointment in the coming future. - Levetiracetam level - Renal Function Panel    Reason for frequent admissions/ER visits **      Objective:   Vitals:   11/05/20 1506  BP: 140/90  Pulse: 72  Weight: 210 lb (95.3 kg)  Height: 5\' 9"  (1.753 m)    Wt Readings from Last 3 Encounters:  11/05/20 210 lb (95.3 kg)  10/19/20 216 lb 2 oz (98 kg)  06/17/20 190 lb (86.2 kg)    Allergies as of 11/05/2020   No Known Allergies     Medication List       Accurate as of November 05, 2020  4:09 PM. If you have any questions, ask your nurse or doctor.        STOP taking these medications   hydrochlorothiazide 25 MG tablet Commonly known as: HYDRODIURIL Stopped by: Otilio Miu, MD     TAKE these medications   acetaminophen 500 MG tablet Commonly known as: TYLENOL Take 500 mg by mouth at bedtime. As needed/ otc   albuterol 108 (90 Base) MCG/ACT inhaler Commonly known as: VENTOLIN HFA Inhale 1-2 puffs into the lungs every 6 (six) hours as needed for wheezing or shortness of breath.   aspirin 81 MG EC tablet Take 1 tablet (81 mg total) daily by mouth. What changed:   when to take this  reasons to take this   atorvastatin 80 MG tablet Commonly known as: LIPITOR Take 1 tablet (80 mg total) by mouth daily at 6 PM.   clopidogrel 75 MG tablet Commonly known as: PLAVIX Take 1 tablet (75 mg total) by mouth daily.   diclofenac Sodium 1 % Gel Commonly known as: Voltaren Apply 2 g topically 4 (four) times daily.   fexofenadine 180 MG tablet Commonly known as: ALLEGRA Take 180 mg by mouth as needed for allergies or rhinitis. otc   levETIRAcetam 750 MG tablet Commonly known as: KEPPRA Take 1 tablet by mouth in the morning and at bedtime.   losartan 50 MG tablet Commonly known as: COZAAR Take 1 tablet (50 mg total) by mouth daily.   metoprolol tartrate 50 MG tablet Commonly known as:  LOPRESSOR Take 1 tablet (50 mg total)  by mouth 2 (two) times daily.   nitroGLYCERIN 0.4 MG SL tablet Commonly known as: NITROSTAT Place 1 tablet (0.4 mg total) under the tongue every 5 (five) minutes x 3 doses as needed for chest pain.   potassium chloride SA 20 MEQ tablet Commonly known as: KLOR-CON Take 1 tablet by mouth daily.        Physical Exam: Constitutional: Patient appears well-developed and well-nourished. Not in obvious distress. HENT: Normocephalic, atraumatic, External right and left ear normal. Oropharynx is clear and moist.  Eyes: Conjunctivae and EOM are normal. PERRLA, no scleral icterus. Neck: Normal ROM. Neck supple. No JVD. No tracheal deviation. No thyromegaly. CVS: RRR, S1/S2 +, no murmurs, no gallops, no carotid bruit.  Pulmonary: Effort and breath sounds normal, no stridor, rhonchi, wheezes, rales.  Abdominal: Soft. BS +, no distension, tenderness, rebound or guarding.  Musculoskeletal: Normal range of motion. No edema and no tenderness.  Lymphadenopathy: No lymphadenopathy noted, cervical, inguinal or axillary Neuro: Alert. Normal reflexes, muscle tone coordination. No cranial nerve deficit. Skin: Skin is warm and dry. No rash noted. Not diaphoretic. No erythema. No pallor. Psychiatric: Normal mood and affect. Behavior, judgment, thought content normal.   Data Review   Micro Results No results found for this or any previous visit (from the past 240 hour(s)).   CBC No results for input(s): WBC, HGB, HCT, PLT, MCV, MCH, MCHC, RDW, LYMPHSABS, MONOABS, EOSABS, BASOSABS, BANDABS in the last 168 hours.  Invalid input(s): NEUTRABS, BANDSABD  Chemistries  No results for input(s): NA, K, CL, CO2, GLUCOSE, BUN, CREATININE, CALCIUM, MG, AST, ALT, ALKPHOS, BILITOT in the last 168 hours.  Invalid input(s): GFRCGP ------------------------------------------------------------------------------------------------------------------ CrCl cannot be calculated  (Patient's most recent lab result is older than the maximum 21 days allowed.). ------------------------------------------------------------------------------------------------------------------ No results for input(s): HGBA1C in the last 72 hours. ------------------------------------------------------------------------------------------------------------------ No results for input(s): CHOL, HDL, LDLCALC, TRIG, CHOLHDL, LDLDIRECT in the last 72 hours. ------------------------------------------------------------------------------------------------------------------ No results for input(s): TSH, T4TOTAL, T3FREE, THYROIDAB in the last 72 hours.  Invalid input(s): FREET3 ------------------------------------------------------------------------------------------------------------------ No results for input(s): VITAMINB12, FOLATE, FERRITIN, TIBC, IRON, RETICCTPCT in the last 72 hours.  Coagulation profile No results for input(s): INR, PROTIME in the last 168 hours.  No results for input(s): DDIMER in the last 72 hours.  Cardiac Enzymes No results for input(s): CKMB, TROPONINI, MYOGLOBIN in the last 168 hours.  Invalid input(s): CK ------------------------------------------------------------------------------------------------------------------ Invalid input(s): POCBNP   Time Spent in minutes  [place] M.D on 11/05/2020 at 4:09 PM   Disclaimer: This note may have been dictated with voice recognition software. Similar sounding words can inadvertently be transcribed and this note may contain transcription errors which may not have been corrected upon publication of note.

## 2020-11-08 ENCOUNTER — Telehealth: Payer: Self-pay | Admitting: Cardiovascular Disease

## 2020-11-08 ENCOUNTER — Telehealth: Payer: Self-pay

## 2020-11-08 NOTE — Telephone Encounter (Signed)
Called pt and his wife- has appt with Dr Melrose Nakayama tomorrow T 10:30 in the morning. Also told him to call Dr Donivan Scull office and see if he needs to see him in follow up since he just saw him prior to hospitalization. If he needs anything, pt is to call me back

## 2020-11-08 NOTE — Telephone Encounter (Signed)
Patients wife calling in to state after recent visit with office patient suffered a seizure. Patient was at [place] where he had a heart cath due to high troponin's. Wife just wanted to make Dr. Rockey Situ aware

## 2020-11-10 ENCOUNTER — Ambulatory Visit (INDEPENDENT_AMBULATORY_CARE_PROVIDER_SITE_OTHER): Payer: Medicare HMO

## 2020-11-10 DIAGNOSIS — Z0000 Encounter for general adult medical examination without abnormal findings: Secondary | ICD-10-CM

## 2020-11-10 LAB — RENAL FUNCTION PANEL
Albumin: 4.3 g/dL (ref 3.7–4.7)
BUN/Creatinine Ratio: 24 (ref 10–24)
BUN: 21 mg/dL (ref 8–27)
CO2: 21 mmol/L (ref 20–29)
Calcium: 9.2 mg/dL (ref 8.6–10.2)
Chloride: 103 mmol/L (ref 96–106)
Creatinine, Ser: 0.86 mg/dL (ref 0.76–1.27)
GFR calc Af Amer: 101 mL/min/{1.73_m2} (ref >59)
GFR calc non Af Amer: 87 mL/min/{1.73_m2} (ref >59)
Glucose: 90 mg/dL (ref 65–99)
Phosphorus: 3.3 mg/dL (ref 2.8–4.1)
Potassium: 4.5 mmol/L (ref 3.5–5.2)
Sodium: 139 mmol/L (ref 134–144)

## 2020-11-10 LAB — CBC WITH DIFFERENTIAL/PLATELET
Basophils Absolute: 0.1 10*3/uL (ref 0.0–0.2)
Basos: 1 %
EOS (ABSOLUTE): 0.1 10*3/uL (ref 0.0–0.4)
Eos: 1 %
Hematocrit: 39 % (ref 37.5–51.0)
Hemoglobin: 13.7 g/dL (ref 13.0–17.7)
Immature Grans (Abs): 0 10*3/uL (ref 0.0–0.1)
Immature Granulocytes: 0 %
Lymphocytes Absolute: 2.1 10*3/uL (ref 0.7–3.1)
Lymphs: 25 %
MCH: 31.1 pg (ref 26.6–33.0)
MCHC: 35.1 g/dL (ref 31.5–35.7)
MCV: 89 fL (ref 79–97)
Monocytes Absolute: 0.7 10*3/uL (ref 0.1–0.9)
Monocytes: 8 %
Neutrophils Absolute: 5.4 10*3/uL (ref 1.4–7.0)
Neutrophils: 65 %
Platelets: 326 10*3/uL (ref 150–450)
RBC: 4.4 x10E6/uL (ref 4.14–5.80)
RDW: 12.6 % (ref 11.6–15.4)
WBC: 8.5 10*3/uL (ref 3.4–10.8)

## 2020-11-10 LAB — LEVETIRACETAM LEVEL: Levetiracetam Lvl: 22.2 ug/mL (ref 10.0–40.0)

## 2020-11-10 NOTE — Progress Notes (Signed)
Subjective:   George Doyle is a 70 y.o. male who presents for Medicare Annual/Subsequent preventive examination.  Virtual Visit via Telephone Note  I connected with  George Doyle on 11/10/20 at  2:00 PM EST by telephone and verified that I am speaking with the correct person using two identifiers.  Location: Patient: home Provider: [facility] Persons participating in the virtual visit: [employer]   I discussed the limitations, risks, security and privacy concerns of performing an evaluation and management service by telephone and the availability of in person appointments. The patient expressed understanding and agreed to proceed.  Interactive audio and video telecommunications were attempted between this nurse and patient, however failed, due to patient having technical difficulties OR patient did not have access to video capability.  We continued and completed visit with audio only.  Some vital signs may be absent or patient reported.   George Marker, LPN    Review of Systems     Cardiac Risk Factors include: advanced age (>2men, >82 women);dyslipidemia;male gender;hypertension;obesity (BMI >30kg/m2)     Objective:    There were no vitals filed for this visit. There is no height or weight on file to calculate BMI.  Advanced Directives 11/10/2020 11/10/2019 07/18/2018 07/15/2018 04/02/2018 09/24/2017 11/06/2016  Does Patient Have a Medical Advance Directive? Yes Yes Yes Yes Yes Yes Yes  Type of Paramedic of [place];Living will [place];Living will [place];Living will [place];Living will [place];Living will [place];Living will [place]  Does patient want to make changes to medical advance directive? - - No - Patient declined No - Patient declined - No - Patient declined -  Copy of [place] in Chart? Yes - validated most recent copy scanned in chart (See row information) Yes - validated most recent copy scanned in chart (See row information) Yes Yes - Yes Yes  Would patient like information on creating a medical advance directive? - - - - - - -  Some encounter information is confidential and restricted. Go to Review Flowsheets activity to see all data.    Current Medications (verified) Outpatient Encounter Medications as of 11/10/2020  Medication Sig  . acetaminophen (TYLENOL) 500 MG tablet Take 500 mg by mouth at bedtime. As needed/ otc  . albuterol (VENTOLIN HFA) 108 (90 Base) MCG/ACT inhaler Inhale 1-2 puffs into the lungs every 6 (six) hours as needed for wheezing or shortness of breath.  Marland Kitchen aspirin EC 81 MG EC tablet Take 1 tablet (81 mg total) daily by mouth.  Marland Kitchen atorvastatin (LIPITOR) 80 MG tablet Take 1 tablet (80 mg total) by mouth daily at 6 PM.  . clopidogrel (PLAVIX) 75 MG tablet Take 1 tablet (75 mg total) by mouth daily.  . diclofenac Sodium (VOLTAREN) 1 % GEL Apply 2 g topically 4 (four) times daily.  . fexofenadine (ALLEGRA) 180 MG tablet Take 180 mg by mouth as needed for allergies or rhinitis. otc  . lamoTRIgine (LAMICTAL) 25 MG tablet Take by mouth. Wk1:1tab night wk2:1tab 2xday wk3:1tab AM 2tabsPM wk4:2tabs 2xday wk5:2tabs AM 3tabs PM wk6:3tabs 2xday wk7:3tabs AM 4tabs PM, wk8:4tab2xday  . levETIRAcetam (KEPPRA) 750 MG tablet Take 1 tablet by mouth in the morning and at bedtime.  Marland Kitchen losartan (COZAAR) 50 MG tablet Take 1 tablet (50 mg total) by mouth daily.  . metoprolol tartrate (LOPRESSOR) 50 MG tablet Take 1 tablet (50 mg total) by mouth  2 (two) times daily.  . nitroGLYCERIN (NITROSTAT) 0.4 MG SL tablet Place 1 tablet (0.4 mg total) under the tongue every 5 (five) minutes x 3 doses as needed for chest pain.  . [DISCONTINUED] potassium chloride SA (KLOR-CON) 20 MEQ tablet Take 1 tablet by mouth daily.   No facility-administered encounter medications on  file as of 11/10/2020.    Allergies (verified) Patient has no known allergies.   History: Past Medical History:  Diagnosis Date  . Allergy   . Arthritis    knees, hands  . Hypertension   . Myocardial infarction ([place]) 07/2017  . Pneumonia   . Seizures ([place])    approx 1970's, 2005, June 2019  Testing showed no cause. Extended EEG scheduled for November 2019   Past Surgical History:  Procedure Laterality Date  . BICEPT TENODESIS Right 07/18/2018   Procedure: BICEPS TENODESIS;  Surgeon: Corky Mull, MD;  Location: [facility];  Service: Orthopedics;  Laterality: Right;  . COLONOSCOPY  2011   cleared for 10 yrs  . COLONOSCOPY WITH PROPOFOL N/A 11/06/2016   Procedure: COLONOSCOPY WITH PROPOFOL;  Surgeon: [name], MD;  Location: [place];  Service: Endoscopy;  Laterality: N/A;  . CORONARY STENT INTERVENTION N/A 07/23/2017   Procedure: CORONARY STENT INTERVENTION;  Surgeon: Yolonda Kida, MD;  Location: [place] [facility];  Service: Cardiovascular;  Laterality: N/A;  . CYST EXCISION     Forearm  . HERNIA REPAIR    . KNEE SURGERY Bilateral    2 on R) 1 on L)  . LEFT HEART CATH AND CORONARY ANGIOGRAPHY N/A 07/23/2017   Procedure: LEFT HEART CATH AND CORONARY ANGIOGRAPHY;  Surgeon: Minna Merritts, MD;  Location: [place] [facility];  Service: Cardiovascular;  Laterality: N/A;  . POLYPECTOMY  11/06/2016   Procedure: POLYPECTOMY;  Surgeon: [name], MD;  Location: [place];  Service: Endoscopy;;  . SHOULDER ARTHROSCOPY WITH OPEN ROTATOR CUFF REPAIR Right 07/18/2018   Procedure: SHOULDER ARTHROSCOPY WITH OPEN ROTATOR CUFF REPAIR;  Surgeon: Corky Mull, MD;  Location: [facility];  Service: Orthopedics;  Laterality: Right;   Family History  Problem Relation Age of Onset  . CAD Father   . Lung cancer Father   . Heart failure Mother    Social History   Socioeconomic History  . Marital status: Married    Spouse name: George Doyle  . Number of children: 2  .  Years of education: Not on file  . Highest education level: Not on file  Occupational History  . Not on file  Tobacco Use  . Smoking status: Former Research scientist (life sciences)  . Smokeless tobacco: Never Used  . Tobacco comment: quit over 20 yrs ago  Vaping Use  . Vaping Use: Never used  Substance and Sexual Activity  . Alcohol use: Yes    Alcohol/week: 0.0 standard drinks    Comment: 1-2 drinks/year  . Drug use: No  . Sexual activity: Not Currently  Other Topics Concern  . Not on file  Social History Narrative  . Not on file   Social Determinants of Health   Financial Resource Strain: Low Risk   . Difficulty of Paying Living Expenses: Not hard at all  Food Insecurity: No Food Insecurity  . Worried About Charity fundraiser in the Last Year: Never true  . Ran Out of Food in the Last Year: Never true  Transportation Needs: No Transportation Needs  . Lack of Transportation (Medical): No  . Lack of Transportation (Non-Medical): No  Physical Activity: Inactive  . Days of Exercise per Week: 0 days  . Minutes of Exercise per Session: 0 min  Stress: No Stress Concern Present  . Feeling of Stress : Only a little  Social Connections: Moderately Isolated  . Frequency of Communication with Friends and Family: More than three times a week  . Frequency of Social Gatherings with Friends and Family: Twice a week  . Attends Religious Services: Never  . Active Member of Clubs or Organizations: No  . Attends Archivist Meetings: Never  . Marital Status: Married    Tobacco Counseling Counseling given: Not Answered Comment: quit over 20 yrs ago   Clinical Intake:  Pre-visit preparation completed: Yes  Pain : No/denies pain     Nutritional Risks: None Diabetes: No  How often do you need to have someone help you when you read instructions, pamphlets, or other written materials from your doctor or pharmacy?: 1 - Never    Interpreter Needed?: No  Information entered by :: George Marker  LPN   Activities of Daily Living In your present state of health, do you have any difficulty performing the following activities: 11/10/2020  Hearing? N  Comment declines hearing aids  Vision? N  Difficulty concentrating or making decisions? N  Walking or climbing stairs? N  Dressing or bathing? N  Doing errands, shopping? N  Preparing Food and eating ? N  Using the Toilet? N  In the past six months, have you accidently leaked urine? N  Do you have problems with loss of bowel control? N  Managing your Medications? N  Managing your Finances? N  Housekeeping or managing your Housekeeping? N  Some recent data might be hidden    Patient Care Team: Juline Patch, MD as PCP - General (Family Medicine) Minna Merritts, MD as Consulting Physician (Cardiology) Anabel Bene, MD as Referring Physician (Neurology)  Indicate any recent Medical Services you may have received from other than [facility] providers in the past year (date may be approximate).     Assessment:   This is a routine wellness examination for [place].  Hearing/Vision screen  Hearing Screening   125Hz  250Hz  500Hz  1000Hz  2000Hz  3000Hz  4000Hz  6000Hz  8000Hz   Right ear:           Left ear:           Comments: Pt states mild hearing difficulty occasionally in right ear due to allergies and congestion; declines hearing aids  Vision Screening Comments: Vision screenings with Dr. Atilano Median in [place]  Dietary issues and exercise activities discussed: Current Exercise Habits: The patient does not participate in regular exercise at present, Exercise limited by: cardiac condition(s)  Goals    . DIET - INCREASE WATER INTAKE     Recommend drinking 6-8 glasses of water per day       Depression Screen PHQ 2/9 Scores 11/10/2020 11/05/2020 06/09/2020 12/16/2019 11/10/2019 12/12/2018 06/07/2018  PHQ - 2 Score 0 0 0 0 0 0 2  PHQ- 9 Score - 0 0 1 - 1 4    Fall Risk Fall Risk  11/10/2020 06/17/2020 12/16/2019 11/10/2019 09/24/2017  Falls in  the past year? 0 0 0 0 No  Number falls in past yr: 0 - - 0 -  Injury with Fall? 0 - - 0 -  Risk for fall due to : No Fall Risks - - No Fall Risks History of fall(s);Impaired balance/gait  Risk for fall due to: Comment - - - - knees sometimes  give out on patient and have caused falls in the past but not in the past 12 months.   Follow up Falls prevention discussed Falls evaluation completed Falls evaluation completed Falls prevention discussed -    FALL RISK PREVENTION PERTAINING TO THE HOME:  Any stairs in or around the home? Yes  If so, are there any without handrails? Yes  - a couple of steps outside Home free of loose throw rugs in walkways, pet beds, electrical cords, etc? Yes  Adequate lighting in your home to reduce risk of falls? Yes   ASSISTIVE DEVICES UTILIZED TO PREVENT FALLS:  Life alert? No  Use of a cane, walker or w/c? No  Grab bars in the bathroom? Yes  Shower chair or bench in shower? Yes  Elevated toilet seat or a handicapped toilet? No   TIMED UP AND GO:  Was the test performed? No . Telephonic visit.   Cognitive Function: Normal cognitive status assessed by direct observation by this Nurse Health Advisor. No abnormalities found.       6CIT Screen 11/10/2019  What Year? 0 points  What month? 0 points  What time? 0 points  Count back from 20 0 points  Months in reverse 0 points  Repeat phrase 2 points  Total Score 2    Immunizations Immunization History  Administered Date(s) Administered  . Fluad Quad(high Dose 65+) 06/18/2019  . Influenza, High Dose Seasonal PF 06/25/2018  . Influenza,inj,Quad PF,6+ Mos 05/10/2017  . Influenza-Unspecified 06/28/2015, 06/07/2020  . PFIZER(Purple Top)SARS-COV-2 Vaccination 10/11/2019, 11/01/2019, 06/19/2020  . Pneumococcal Conjugate-13 10/12/2016  . Pneumococcal Polysaccharide-23 07/12/2015, 12/25/2017  . Tdap 10/12/2016  . Zoster Recombinat (Shingrix) 04/26/2017, 08/23/2017    TDAP status: Up to date  Flu  Vaccine status: Up to date  Pneumococcal vaccine status: Up to date  Covid-19 vaccine status: Completed vaccines  Qualifies for Shingles Vaccine? Yes   Zostavax completed Yes   Shingrix Completed?: Yes  Screening Tests Health Maintenance  Topic Date Due  . COVID-19 Vaccine (4 - Booster for Pfizer series) 12/18/2020  . COLONOSCOPY (Pts 45-30yrs Insurance coverage will need to be confirmed)  11/06/2021  . TETANUS/TDAP  10/12/2026  . INFLUENZA VACCINE  Completed  . Hepatitis C Screening  Completed  . PNA vac Low Risk Adult  Completed    Health Maintenance  There are no preventive care reminders to display for this patient.  Colorectal cancer screening: Type of screening: Colonoscopy. Completed 11/06/16. Repeat every 5 years  Lung Cancer Screening: (Low Dose CT Chest recommended if Age 6-80 years, 30 pack-year currently smoking OR have quit w/in 15years.) does not qualify.   Additional Screening:  Hepatitis C Screening: does qualify; Completed 05/10/17  Vision Screening: Recommended annual ophthalmology exams for early detection of glaucoma and other disorders of the eye. Is the patient up to date with their annual eye exam?  Yes  Who is the provider or what is the name of the office in which the patient attends annual eye exams? Dr. Atilano Median  Dental Screening: Recommended annual dental exams for proper oral hygiene  Community Resource Referral / Chronic Care Management: CRR required this visit?  No   CCM required this visit?  No      Plan:     I have personally reviewed and noted the following in the patient's chart:   . Medical and social history . Use of alcohol, tobacco or illicit drugs  . Current medications and supplements . Functional ability and status . Nutritional status .  Physical activity . Advanced directives . List of other physicians . Hospitalizations, surgeries, and ER visits in previous 12 months . Vitals . Screenings to include cognitive,  depression, and falls . Referrals and appointments  In addition, I have reviewed and discussed with patient certain preventive protocols, quality metrics, and best practice recommendations. A written personalized care plan for preventive services as well as general preventive health recommendations were provided to patient.     George Marker, LPN   10/29/1550   Nurse Notes: pt seen by neurology yesterday for seizure follow up. Pt will be tapered off of keppra within 8 weeks  and has started initial tapering for lamictal.

## 2020-11-10 NOTE — Patient Instructions (Signed)
[name] , Thank you for taking time to come for your Medicare Wellness Visit. I appreciate your ongoing commitment to your health goals. Please review the following plan we discussed and let me know if I can assist you in the future.   Screening recommendations/referrals: Colonoscopy: done 11/06/16. Repeat in 2023 Recommended yearly ophthalmology/optometry visit for glaucoma screening and checkup Recommended yearly dental visit for hygiene and checkup  Vaccinations: Influenza vaccine: done 06/07/20 Pneumococcal vaccine: done 12/25/17 Tdap vaccine: done 10/12/16 Shingles vaccine: done 04/26/17 & 08/23/17   Covid-19: done 10/11/19, 11/01/19 & 06/19/20  Conditions/risks identified: Recommend increasing physical activity   Next appointment: Follow up in one year for your annual wellness visit.   Preventive Care 26 Years and Older, Male Preventive care refers to lifestyle choices and visits with your health care provider that can promote health and wellness. What does preventive care include?  A yearly physical exam. This is also called an annual well check.  Dental exams once or twice a year.  Routine eye exams. Ask your health care provider how often you should have your eyes checked.  Personal lifestyle choices, including:  Daily care of your teeth and gums.  Regular physical activity.  Eating a healthy diet.  Avoiding tobacco and drug use.  Limiting alcohol use.  Practicing safe sex.  Taking low doses of aspirin every day.  Taking vitamin and mineral supplements as recommended by your health care provider. What happens during an annual well check? The services and screenings done by your health care provider during your annual well check will depend on your age, overall health, lifestyle risk factors, and family history of disease. Counseling  Your health care provider may ask you questions about your:  Alcohol use.  Tobacco use.  Drug use.  Emotional  well-being.  Home and relationship well-being.  Sexual activity.  Eating habits.  History of falls.  Memory and ability to understand (cognition).  Work and work Statistician. Screening  You may have the following tests or measurements:  Height, weight, and BMI.  Blood pressure.  Lipid and cholesterol levels. These may be checked every 5 years, or more frequently if you are over 38 years old.  Skin check.  Lung cancer screening. You may have this screening every year starting at age 91 if you have a 30-pack-year history of smoking and currently smoke or have quit within the past 15 years.  Fecal occult blood test (FOBT) of the stool. You may have this test every year starting at age 61.  Flexible sigmoidoscopy or colonoscopy. You may have a sigmoidoscopy every 5 years or a colonoscopy every 10 years starting at age 24.  Prostate cancer screening. Recommendations will vary depending on your family history and other risks.  Hepatitis C blood test.  Hepatitis B blood test.  Sexually transmitted disease (STD) testing.  Diabetes screening. This is done by checking your blood sugar (glucose) after you have not eaten for a while (fasting). You may have this done every 1-3 years.  Abdominal aortic aneurysm (AAA) screening. You may need this if you are a current or former smoker.  Osteoporosis. You may be screened starting at age 1 if you are at high risk. Talk with your health care provider about your test results, treatment options, and if necessary, the need for more tests. Vaccines  Your health care provider may recommend certain vaccines, such as:  Influenza vaccine. This is recommended every year.  Tetanus, diphtheria, and acellular pertussis (Tdap, Td) vaccine. You  may need a Td booster every 10 years.  Zoster vaccine. You may need this after age 23.  Pneumococcal 13-valent conjugate (PCV13) vaccine. One dose is recommended after age 76.  Pneumococcal  polysaccharide (PPSV23) vaccine. One dose is recommended after age 19. Talk to your health care provider about which screenings and vaccines you need and how often you need them. This information is not intended to replace advice given to you by your health care provider. Make sure you discuss any questions you have with your health care provider. Document Released: 10/01/2015 Document Revised: 05/24/2016 Document Reviewed: 07/06/2015 Elsevier Interactive Patient Education  2017 [place] Prevention in the Home Falls can cause injuries. They can happen to people of all ages. There are many things you can do to make your home safe and to help prevent falls. What can I do on the outside of my home?  Regularly fix the edges of walkways and driveways and fix any cracks.  Remove anything that might make you trip as you walk through a door, such as a raised step or threshold.  Trim any bushes or trees on the path to your home.  Use bright outdoor lighting.  Clear any walking paths of anything that might make someone trip, such as rocks or tools.  Regularly check to see if handrails are loose or broken. Make sure that both sides of any steps have handrails.  Any raised decks and porches should have guardrails on the edges.  Have any leaves, snow, or ice cleared regularly.  Use sand or salt on walking paths during winter.  Clean up any spills in your garage right away. This includes oil or grease spills. What can I do in the bathroom?  Use night lights.  Install grab bars by the toilet and in the tub and shower. Do not use towel bars as grab bars.  Use non-skid mats or decals in the tub or shower.  If you need to sit down in the shower, use a plastic, non-slip stool.  Keep the floor dry. Clean up any water that spills on the floor as soon as it happens.  Remove soap buildup in the tub or shower regularly.  Attach bath mats securely with double-sided non-slip rug  tape.  Do not have throw rugs and other things on the floor that can make you trip. What can I do in the bedroom?  Use night lights.  Make sure that you have a light by your bed that is easy to reach.  Do not use any sheets or blankets that are too big for your bed. They should not hang down onto the floor.  Have a firm chair that has side arms. You can use this for support while you get dressed.  Do not have throw rugs and other things on the floor that can make you trip. What can I do in the kitchen?  Clean up any spills right away.  Avoid walking on wet floors.  Keep items that you use a lot in easy-to-reach places.  If you need to reach something above you, use a strong step stool that has a grab bar.  Keep electrical cords out of the way.  Do not use floor polish or wax that makes floors slippery. If you must use wax, use non-skid floor wax.  Do not have throw rugs and other things on the floor that can make you trip. What can I do with my stairs?  Do not leave any items  on the stairs.  Make sure that there are handrails on both sides of the stairs and use them. Fix handrails that are broken or loose. Make sure that handrails are as long as the stairways.  Check any carpeting to make sure that it is firmly attached to the stairs. Fix any carpet that is loose or worn.  Avoid having throw rugs at the top or bottom of the stairs. If you do have throw rugs, attach them to the floor with carpet tape.  Make sure that you have a light switch at the top of the stairs and the bottom of the stairs. If you do not have them, ask someone to add them for you. What else can I do to help prevent falls?  Wear shoes that:  Do not have high heels.  Have rubber bottoms.  Are comfortable and fit you well.  Are closed at the toe. Do not wear sandals.  If you use a stepladder:  Make sure that it is fully opened. Do not climb a closed stepladder.  Make sure that both sides of the  stepladder are locked into place.  Ask someone to hold it for you, if possible.  Clearly mark and make sure that you can see:  Any grab bars or handrails.  First and last steps.  Where the edge of each step is.  Use tools that help you move around (mobility aids) if they are needed. These include:  Canes.  Walkers.  Scooters.  Crutches.  Turn on the lights when you go into a dark area. Replace any light bulbs as soon as they burn out.  Set up your furniture so you have a clear path. Avoid moving your furniture around.  If any of your floors are uneven, fix them.  If there are any pets around you, be aware of where they are.  Review your medicines with your doctor. Some medicines can make you feel dizzy. This can increase your chance of falling. Ask your doctor what other things that you can do to help prevent falls. This information is not intended to replace advice given to you by your health care provider. Make sure you discuss any questions you have with your health care provider. Document Released: 07/01/2009 Document Revised: 02/10/2016 Document Reviewed: 10/09/2014 Elsevier Interactive Patient Education  2017 [employer].

## 2020-11-12 ENCOUNTER — Other Ambulatory Visit: Payer: Self-pay

## 2020-11-12 ENCOUNTER — Encounter: Payer: Self-pay | Admitting: Physician Assistant

## 2020-11-12 ENCOUNTER — Ambulatory Visit: Payer: Medicare HMO | Admitting: Physician Assistant

## 2020-11-12 VITALS — BP 140/96 | HR 60 | Ht 69.0 in | Wt 212.0 lb

## 2020-11-12 DIAGNOSIS — I10 Essential (primary) hypertension: Secondary | ICD-10-CM

## 2020-11-12 DIAGNOSIS — I5020 Unspecified systolic (congestive) heart failure: Secondary | ICD-10-CM

## 2020-11-12 DIAGNOSIS — I77819 Aortic ectasia, unspecified site: Secondary | ICD-10-CM

## 2020-11-12 DIAGNOSIS — I252 Old myocardial infarction: Secondary | ICD-10-CM

## 2020-11-12 DIAGNOSIS — G40909 Epilepsy, unspecified, not intractable, without status epilepticus: Secondary | ICD-10-CM

## 2020-11-12 DIAGNOSIS — Z87898 Personal history of other specified conditions: Secondary | ICD-10-CM

## 2020-11-12 DIAGNOSIS — E785 Hyperlipidemia, unspecified: Secondary | ICD-10-CM

## 2020-11-12 DIAGNOSIS — I2510 Atherosclerotic heart disease of native coronary artery without angina pectoris: Secondary | ICD-10-CM | POA: Diagnosis not present

## 2020-11-12 MED ORDER — SPIRONOLACTONE 25 MG PO TABS: 25.0000 mg | ORAL_TABLET | Freq: Every day | ORAL | 3 refills | Status: DC

## 2020-11-12 MED ORDER — CARVEDILOL 12.5 MG PO TABS: 12.5000 mg | ORAL_TABLET | Freq: Two times a day (BID) | ORAL | 3 refills | Status: DC

## 2020-11-12 MED ORDER — CARVEDILOL 6.25 MG PO TABS: 6.2500 mg | ORAL_TABLET | Freq: Two times a day (BID) | ORAL | 3 refills | Status: DC

## 2020-11-12 NOTE — Patient Instructions (Addendum)
Medication Instructions:  Your physician has recommended you make the following change in your medication:   STOP Metoprolol  START Carvedilol 6.25mg  TWICE daily  START Spironolactone 25mg  DAILY  *If you need a refill on your cardiac medications before your next appointment, please call your pharmacy*   Lab Work:  None ordered  Testing/Procedures:  None ordered   Follow-Up: At [facility], you and your health needs are our priority.  As part of our continuing mission to provide you with exceptional heart care, we have created designated Provider Care Teams.  These Care Teams include your primary Cardiologist (physician) and Advanced Practice Providers (APPs -  Physician Assistants and Nurse Practitioners) who all work together to provide you with the care you need, when you need it.  We recommend signing up for the patient portal called "MyChart".  Sign up information is provided on this After Visit Summary.  MyChart is used to connect with patients for Virtual Visits (Telemedicine).  Patients are able to view lab/test results, encounter notes, upcoming appointments, etc.  Non-urgent messages can be sent to your provider as well.   To learn more about what you can do with MyChart, go to [email or web address].    Your next appointment:    Follow up as scheduled with Dr. Rockey Situ in October   The format for your next appointment:   In Person  Provider:   You may see  or one of the following Advanced Practice Providers on your designated Care Team:    Murray Hodgkins, NP  Christell Faith, PA-C  Marrianne Mood, PA-C  Cadence Kathlen Mody, [place]  Laurann Montana, NP    Other Instructions  Monitor your BP at home after you get your new cuff.  Please call our office if your BP is consistently greater than 130/80 and we will increase your Losartan as discussed in clinic today.  Please call our office with any changes or if you continue retaining fluid.

## 2020-11-12 NOTE — Progress Notes (Signed)
Office Visit    Patient Name: George Doyle Date of Encounter: 11/12/2020  PCP:  Juline Patch, MD   [place]  Cardiologist:  Ida Rogue, MD  Advanced Practice Provider:  No care team member to display Electrophysiologist:  None   Chief Complaint    Chief Complaint  Patient presents with  . Other    Follow up post CATH at [facility]. Patient c/o some Swelling in ankles. Meds reviewed verbally with patient.     70 year old male with history of CAD/non-STEMI, 2018 cath with occluded proximal RCA and collaterals left to right with PCI to RCA unsuccessful, hypertension, hyperlipidemia, former tobacco use, seizure disorder 2005, and who presents today after [facility] catheterization without intervention with report of swelling in ankles.  Past Medical History    Past Medical History:  Diagnosis Date  . Allergy   . Arthritis    knees, hands  . Hypertension   . Myocardial infarction ([place]) 07/2017  . Pneumonia   . Seizures ([place])    approx 1970's, 2005, June 2019  Testing showed no cause. Extended EEG scheduled for November 2019   Past Surgical History:  Procedure Laterality Date  . BICEPT TENODESIS Right 07/18/2018   Procedure: BICEPS TENODESIS;  Surgeon: Corky Mull, MD;  Location: [facility] ORS;  Service: Orthopedics;  Laterality: Right;  . COLONOSCOPY  2011   cleared for 10 yrs  . COLONOSCOPY WITH PROPOFOL N/A 11/06/2016   Procedure: COLONOSCOPY WITH PROPOFOL;  Surgeon: [name], MD;  Location: [place];  Service: Endoscopy;  Laterality: N/A;  . CORONARY STENT INTERVENTION N/A 07/23/2017   Procedure: CORONARY STENT INTERVENTION;  Surgeon: Yolonda Kida, MD;  Location: [place] CV LAB;  Service: Cardiovascular;  Laterality: N/A;  . CYST EXCISION     Forearm  . HERNIA REPAIR    . KNEE SURGERY Bilateral    2 on R) 1 on L)  . LEFT HEART CATH AND CORONARY ANGIOGRAPHY N/A 07/23/2017   Procedure: LEFT HEART CATH AND CORONARY  ANGIOGRAPHY;  Surgeon: Minna Merritts, MD;  Location: [place] CV LAB;  Service: Cardiovascular;  Laterality: N/A;  . POLYPECTOMY  11/06/2016   Procedure: POLYPECTOMY;  Surgeon: [name], MD;  Location: [place];  Service: Endoscopy;;  . SHOULDER ARTHROSCOPY WITH OPEN ROTATOR CUFF REPAIR Right 07/18/2018   Procedure: SHOULDER ARTHROSCOPY WITH OPEN ROTATOR CUFF REPAIR;  Surgeon: Corky Mull, MD;  Location: [facility] ORS;  Service: Orthopedics;  Laterality: Right;    Allergies  No Known Allergies  History of Present Illness    George Doyle is a 70 y.o. male with PMH as above.  He is a former smoker.  He has history of CAD with previous 07/2017 cath showing occluded proximal RCA with collaterals from left to right.  Moderate to severe mid left circumflex disease noted.  Moderate inferior wall hypokinesis also noted.  PCI to RCA was unsuccessful.  Recommendation was for medical management.  He has history of hypertension and seizure disorder since 2005.  He has cough with ACE inhibitors but can tolerate ARBs.  He was seen 10/2019 and noted to be sedentary with no exercise program.  He had mild shortness of breath that secondary to increasing weight, deconditioning, and poor diet.  ACE inhibitor had been discontinued due to cough and he was tolerating losartan well.  He denied any chest pain or symptoms concerning for angina.  No recent seizures.  He had completed surgery for  his shoulder.  He reported knee pain and that his hip was popping.  It was recommended he monitor his BP at home.  If ongoing chest pain, stress testing was recommended.  On 10/23/2020, he presented to [facility] with seizure and troponin elevated to 6895 with cardiac catheterization performed by [facility].  Cath showed occluded proximal RCA with collaterals from the LAD to fill PDA, PL, and distal RCA.  Otherwise, nonobstructive CAD.  Intervention/PCI not performed.  Angiography showed normal LV SF with EF greater  than 55%.  Subsequent echo showed LVEF 50 to 55% and ascending aorta mildly dilated. Mild MR noted, as well as trivial TR.  Prox ascending Ao 3.9cm on review of EMR. He received normal saline x4 hours.  HCTZ and Toprol were discontinued.  He was started on Lopressor and continued on ASA and Plavix.  He was also continued on losartan and atorvastatin.   Today, 11/12/2020, he returns to clinic and notes that he is overall doing well from a cardiac standpoint since his catheterization.  No chest pain or shortness of breath / dyspnea at his baseline.  He continues to have a sedentary lifestyle.  He does note some mild swelling of his ankles but denies any other signs or symptoms of volume overload. They feel he may be holding on to volume after HCTZ discontinued.  No signs or symptoms of bleeding.  1 alcoholic beverage every three or four months. No tobacco use. He reports medication compliance.  He and his wife have recently purchased a new blood pressure cuff with home readings elevated from that of clinic visit.  Clinic visit BP today 140/96 with blood pressure cuff from home reading 206/108, 178/78.  They intend to return this BP cuff and get another one.  Given [facility] changed his Toprol to Lopressor, it was asked if they knew the reason for this change with both patient and wife unclear as to the reason that this medication was changed.  Home Medications    Current Outpatient Medications on File Prior to Visit  Medication Sig Dispense Refill  . acetaminophen (TYLENOL) 500 MG tablet Take 500 mg by mouth at bedtime. As needed/ otc    . albuterol (VENTOLIN HFA) 108 (90 Base) MCG/ACT inhaler Inhale 1-2 puffs into the lungs every 6 (six) hours as needed for wheezing or shortness of breath. 8 g 2  . aspirin EC 81 MG EC tablet Take 1 tablet (81 mg total) daily by mouth. 30 tablet 1  . atorvastatin (LIPITOR) 80 MG tablet Take 1 tablet (80 mg total) by mouth daily at 6 PM. 90 tablet 1  . clopidogrel (PLAVIX) 75 MG  tablet Take 1 tablet (75 mg total) by mouth daily. 90 tablet 1  . diclofenac Sodium (VOLTAREN) 1 % GEL Apply 2 g topically 4 (four) times daily. 50 g 6  . fexofenadine (ALLEGRA) 180 MG tablet Take 180 mg by mouth as needed for allergies or rhinitis. otc    . lamoTRIgine (LAMICTAL) 25 MG tablet Take by mouth. Wk1:1tab night wk2:1tab 2xday wk3:1tab AM 2tabsPM wk4:2tabs 2xday wk5:2tabs AM 3tabs PM wk6:3tabs 2xday wk7:3tabs AM 4tabs PM, wk8:4tab2xday    . levETIRAcetam (KEPPRA) 750 MG tablet Take 1 tablet by mouth in the morning and at bedtime.    Marland Kitchen losartan (COZAAR) 50 MG tablet Take 1 tablet (50 mg total) by mouth daily. 90 tablet 1  . nitroGLYCERIN (NITROSTAT) 0.4 MG SL tablet Place 1 tablet (0.4 mg total) under the tongue every 5 (five) minutes x 3  doses as needed for chest pain. 30 tablet 2   No current facility-administered medications on file prior to visit.    Review of Systems    He denies chest pain, palpitations, increased dyspnea, pnd, orthopnea, n, v, dizziness, syncope, weight gain, or early satiety. He reports mild LEE.   All other systems reviewed and are otherwise negative except as noted above.  Physical Exam    VS:  BP (!) 140/96 (BP Location: Left Arm, Patient Position: Sitting, Cuff Size: Normal)   Pulse 60   Ht 5\' 9"  (1.753 m)   Wt 212 lb (96.2 kg)   SpO2 98%   BMI 31.31 kg/m  , BMI Body mass index is 31.31 kg/m. GEN: Well nourished, well developed, in no acute distress. Joined by his wife. HEENT: normal. Neck: Supple, no JVD, carotid bruits, or masses. Cardiac: RRR, no murmurs, rubs, or gallops. No clubbing, cyanosis. Bilateral moderate to 1+ edema.  Radials/DP/PT 2+ and equal bilaterally.  Respiratory:  Respirations regular and unlabored, clear to auscultation bilaterally. GI: Soft, nontender, nondistended, BS + x 4. MS: no deformity or atrophy. Skin: warm and dry, no rash. Neuro:  Strength and sensation are intact. Psych: Normal affect.  Accessory Clinical  Findings    ECG personally reviewed by me today -no ekg.  VITALS Reviewed today   Temp Readings from Last 3 Encounters:  06/09/20 98.2 F (36.8 C) (Oral)  07/18/18 (!) 96.4 F (35.8 C)  04/02/18 98.2 F (36.8 C) (Oral)   BP Readings from Last 3 Encounters:  11/12/20 (!) 140/96  11/05/20 140/90  10/19/20 140/90   Pulse Readings from Last 3 Encounters:  11/12/20 60  11/05/20 72  10/19/20 64    Wt Readings from Last 3 Encounters:  11/12/20 212 lb (96.2 kg)  11/05/20 210 lb (95.3 kg)  10/19/20 216 lb 2 oz (98 kg)     LABS  reviewed today   Lab Results  Component Value Date   WBC 8.5 11/05/2020   HGB 13.7 11/05/2020   HCT 39.0 11/05/2020   MCV 89 11/05/2020   PLT 326 11/05/2020   Lab Results  Component Value Date   CREATININE 0.86 11/05/2020   BUN 21 11/05/2020   NA 139 11/05/2020   K 4.5 11/05/2020   CL 103 11/05/2020   CO2 21 11/05/2020   Lab Results  Component Value Date   ALT 9 12/16/2019   AST 19 12/16/2019   ALKPHOS 85 12/16/2019   BILITOT 0.8 12/16/2019   Lab Results  Component Value Date   CHOL 109 12/16/2019   HDL 50 12/16/2019   LDLCALC 45 12/16/2019   TRIG 67 12/16/2019   CHOLHDL 2.1 06/11/2018    No results found for: HGBA1C No results found for: TSH   STUDIES/PROCEDURES reviewed today   Echo [facility] 10/23/20 Summary  1. The left ventricular systolic function is borderline, LVEF is visually  estimated at 50-55%.  2. The right ventricle is normal in size.  3. The aorta at the ascending aorta is mildly dilated.  4. No significant valvular abnormalities.  5. There is no pericardial effusion.   LHC [facility] 10/23/20  1. Occluded proximal RCA with collaterals from the LAD to fill PDA, PL and  distal RCA; otherwise nonobstructive CAD  2. Normal left ventricular systolic function; NW>29%  3. Normal saline 4 hours  4. AML chem 7 and CBC    LHC 2018 Final Conclusions:   Occluded proximal RCA, collaterals from left to right, Moderate  to severe  mid Left circumflex dz, tortuous vessel, calcified Moderate inferior wall hypokinesis Recommendations:  Case discussed with Dr. Clayborn Bigness He will attempt PCI to the RCA, Medical management for now of LCX given tortuous nature of vessel ----  Prox Cx to Mid Cx lesion is 60% stenosed.  Prox RCA to Dist RCA lesion is 100% stenosed.  Post Atrio lesion is 100 % stenosed. Conclusion Unsuccessful attempt with PCI and stent of proximal right coronary artery appears to be a CTO.  Echo 2018 - Left ventricle: The cavity size was normal. There was mild  concentric hypertrophy. Systolic function was normal. The  estimated ejection fraction was in the range of 50% to 55%.  Hypokinesis of the inferior myocardium. Doppler parameters are  consistent with abnormal left ventricular relaxation (grade 1  diastolic dysfunction).  - Aorta: Aortic root was mildly dilated, 3.6 cm  - Ascending aorta: The ascending aorta was mildly dilated, 4.1 cm  - Mitral valve: There was mild regurgitation.  - Right ventricle: Systolic function was normal.  - Pulmonary arteries: Systolic pressure could not be accurately  estimated.   Assessment & Plan    CAD with recent LHC at [facility] without intervention History of failed attempt at PCI to RCA --No current CP or sx concerning for angina.  Reports recent [facility] admission with elevated troponin peak at 6895.0 and subsequent catheterization that showed stable findings when compared with that of 2018 catheterization as above and thus no intervention performed.  [facility] Echo showed EF 50 to 55% and mild dilation of aortic root.  During [facility] admission, Toprol was changed to Lopressor 50 mg twice daily for unclear reasons.  Given elevated BP today with heart rate in the 60s, will discontinue Lopressor and start carvedilol 6.25 mg twice daily today.  Escalate carvedilol at RTC if further room in heart rate and BP at that time.  Will also start spironolactone 25mg  as  below for further BP support and reassess BMET in 1-2 weeks. Continue ASA 81 mg daily with Plavix 75 mg daily.  Continue statin and aggressive risk factor modification.  No further ischemic work-up indicated at this time.  Acute on chronic HFpEF --Reports bilateral ankle edema since HCTZ was discontinued due to recent hypokalemia and since receiving IVF during recent admission. Most recent [facility] echo as above with low normal EF, reviewed with patient today.  Exam consistent with at least mild volume overload. Reviewed recommendations for total fluids and salt intake, as well as s/sx of volume overload and recommendation for daily wt, BP checks.  Will start spironolactone 25 mg today, as this is a potassium sparing diuretic, and he has had low K in the past. Most recent K 3.7 by 2/6 labs. We will recheck a BMET in 1-2 weeks after start of spironolactone.  Continue Coreg 6.25mg  BID, started today for further BP support and given rate in the 60s. Discontinued lopressor with start of Coreg. Continue losartan 50 mg daily, which could be increased in addition to Coreg if room in vitals at RTC. Continue to monitor blood pressure and heart rate at home, as well as daily weights.  Call the office if SBP remains over 130 or if wt increase with s/sx of overload.  Reassess volume status at RTC.  Essential hypertension, goal BP less than 130/80 --BP today elevated at 140/96. Received IVF during admission with HCTZ discontinued due to low K and volume up on exam, which could be contributing to current elevated BP. Home BP cuff accuracy questionable given cuff  readings of 206/108 and 178/78 in office today (compared with 140/96).  They will return this home BP cuff on [employer] and exchange it for a new cuff to be recessed against our clinic cuff.  Given elevated BP and heart rate in the 60s, discontinued lopressor. Started carvedilol 6.25mg  BID and escalate at RTC if further room in pressure and heart rate. Continue current  losartan 50 mg daily, which could also be increased if room in BP at RTC.  We will also start spironolactone 25 mg today for BP control and diuresis with repeat BMET in 1 to 2 weeks.   HLD, LDL goal below 70 --Most recent LDL 11/2019 and at goal. Continue current statin with atorvastatin 80 mg daily. Recheck of LDL per PCP or at RTC.  Dilated aortic root --Continue to monitor with periodic echo.  Most recent echo at [facility] with mild dilation.  Recommend BP and heart rate control.  Continue DAPT and statin.  Mild mitral regurgitation --Continue to monitor with periodic echo.  No reported signs or symptoms of worsening valvular disease today.    Medication changes: Stop Lopressor 50 mg twice daily.  Start carvedilol 6.25 mg twice daily and escalate RTC if further room in heart rate and blood pressure.  At that time, we could also increase losartan.  Start spironolactone 25 mg daily.  Call the office if this does not improve volume status. Labs ordered: None.  BMET at RTC. Studies / Imaging ordered: None. Future considerations: BMET at RTC, increase Coreg/Losartan/Spiro if indicated.  Reassess BP cuff. Disposition: RTC as scheduled Dr. Rockey Situ.   Arvil Chaco, PA-C 11/12/2020

## 2020-11-14 NOTE — Telephone Encounter (Signed)
Cath at [facility] 10/23/2020 1. Occluded proximal RCA with collaterals from the LAD to fill PDA, PL and  distal RCA; otherwise nonobstructive CAD   2. Normal left ventricular systolic function; YW>03%   No change from prior caths

## 2020-12-17 ENCOUNTER — Encounter: Payer: Self-pay | Admitting: Family Medicine

## 2020-12-17 ENCOUNTER — Ambulatory Visit (INDEPENDENT_AMBULATORY_CARE_PROVIDER_SITE_OTHER): Payer: Medicare HMO | Admitting: Family Medicine

## 2020-12-17 ENCOUNTER — Other Ambulatory Visit: Payer: Self-pay

## 2020-12-17 VITALS — BP 140/100 | HR 64 | Ht 69.0 in | Wt 210.0 lb

## 2020-12-17 DIAGNOSIS — I10 Essential (primary) hypertension: Secondary | ICD-10-CM | POA: Diagnosis not present

## 2020-12-17 DIAGNOSIS — E785 Hyperlipidemia, unspecified: Secondary | ICD-10-CM

## 2020-12-17 DIAGNOSIS — I2510 Atherosclerotic heart disease of native coronary artery without angina pectoris: Secondary | ICD-10-CM

## 2020-12-17 MED ORDER — LOSARTAN POTASSIUM 100 MG PO TABS: 100.0000 mg | ORAL_TABLET | Freq: Every day | ORAL | 1 refills | Status: DC

## 2020-12-17 MED ORDER — CLOPIDOGREL BISULFATE 75 MG PO TABS: 75.0000 mg | ORAL_TABLET | Freq: Every day | ORAL | 1 refills | Status: DC

## 2020-12-17 MED ORDER — ATORVASTATIN CALCIUM 80 MG PO TABS: 80.0000 mg | ORAL_TABLET | Freq: Every day | ORAL | 1 refills | Status: DC

## 2020-12-17 NOTE — Progress Notes (Signed)
Date:  12/17/2020   Name:  George Doyle   DOB:  1951/02/01   MRN:  [id number]   Chief Complaint: Hyperlipidemia, Hypertension, and non stemi (Takes plavix)  Hyperlipidemia This is a chronic problem. The current episode started more than 1 year ago. The problem is controlled. Recent lipid tests were reviewed and are normal. He has no history of chronic renal disease, diabetes, hypothyroidism, liver disease, obesity or nephrotic syndrome. There are no known factors aggravating his hyperlipidemia. Pertinent negatives include no chest pain, focal sensory loss, focal weakness, leg pain, myalgias or shortness of breath. Current antihyperlipidemic treatment includes statins. The current treatment provides moderate improvement of lipids. There are no compliance problems.  Risk factors for coronary artery disease include dyslipidemia, hypertension, post-menopausal, male sex and a sedentary lifestyle.  Hypertension This is a chronic problem. The current episode started more than 1 year ago. The problem has been waxing and waning since onset. The problem is uncontrolled. Pertinent negatives include no anxiety, blurred vision, chest pain, headaches, malaise/fatigue, neck pain, orthopnea, palpitations, peripheral edema, PND, shortness of breath or sweats. There are no associated agents to hypertension. Risk factors for coronary artery disease include dyslipidemia, male gender and obesity. Past treatments include angiotensin blockers, alpha 1 blockers and beta blockers. The current treatment provides moderate improvement. There are no compliance problems.  There is no history of angina, kidney disease, CAD/MI, CVA, heart failure, left ventricular hypertrophy, PVD or retinopathy. There is no history of chronic renal disease, a hypertension causing med or renovascular disease.  Heart Problem This is a chronic problem. The current episode started more than 1 year ago. The problem occurs daily. The problem  has been gradually improving. Pertinent negatives include no abdominal pain, chest pain, chills, congestion, coughing, fatigue, fever, headaches, myalgias, nausea, neck pain, rash or sore throat. The treatment provided moderate relief.    Lab Results  Component Value Date   CREATININE 0.86 11/05/2020   BUN 21 11/05/2020   NA 139 11/05/2020   K 4.5 11/05/2020   CL 103 11/05/2020   CO2 21 11/05/2020   Lab Results  Component Value Date   CHOL 109 12/16/2019   HDL 50 12/16/2019   LDLCALC 45 12/16/2019   TRIG 67 12/16/2019   CHOLHDL 2.1 06/11/2018   No results found for: TSH No results found for: HGBA1C Lab Results  Component Value Date   WBC 8.5 11/05/2020   HGB 13.7 11/05/2020   HCT 39.0 11/05/2020   MCV 89 11/05/2020   PLT 326 11/05/2020   Lab Results  Component Value Date   ALT 9 12/16/2019   AST 19 12/16/2019   ALKPHOS 85 12/16/2019   BILITOT 0.8 12/16/2019     Review of Systems  Constitutional: Negative for chills, fatigue, fever and malaise/fatigue.  HENT: Negative for congestion, drooling, ear discharge, ear pain and sore throat.   Eyes: Negative for blurred vision.  Respiratory: Negative for cough, shortness of breath and wheezing.   Cardiovascular: Negative for chest pain, palpitations, orthopnea, leg swelling and PND.  Gastrointestinal: Negative for abdominal pain, blood in stool, constipation, diarrhea and nausea.  Endocrine: Negative for polydipsia.  Genitourinary: Negative for dysuria, frequency, hematuria and urgency.  Musculoskeletal: Negative for back pain, myalgias and neck pain.  Skin: Negative for rash.  Allergic/Immunologic: Negative for environmental allergies.  Neurological: Negative for dizziness, focal weakness and headaches.  Hematological: Does not bruise/bleed easily.  Psychiatric/Behavioral: Negative for suicidal ideas. The patient is not nervous/anxious.  Patient Active Problem List   Diagnosis Date Noted  . Benign neoplasm of skin  of left forearm 01/27/2019  . Pure hypercholesterolemia 06/21/2018  . Poorly-controlled hypertension 06/21/2018  . Seizure disorder ([place]) 06/21/2018  . Hill-Sachs lesion of right shoulder 06/19/2018  . Injury of tendon of long head of right biceps 06/19/2018  . Rotator cuff tendinitis, right 06/19/2018  . Traumatic complete tear of right rotator cuff 06/19/2018  . Headache disorder 05/09/2018  . CAD (coronary artery disease), native coronary artery 03/28/2018  . History of seizures 03/28/2018  . Subdural hematoma ([place]) 03/27/2018  . Syncope 03/27/2018  . Old MI (myocardial infarction) 08/30/2017  . CAD, multiple vessel 08/01/2017  . NSTEMI (non-ST elevated myocardial infarction) ([place]) 07/20/2017  . Hyperlipidemia 05/10/2017  . Preop cardiovascular exam   . Benign neoplasm of ascending colon   . Polyp of sigmoid colon   . Essential hypertension 03/03/2016    No Known Allergies  Past Surgical History:  Procedure Laterality Date  . BICEPT TENODESIS Right 07/18/2018   Procedure: BICEPS TENODESIS;  Surgeon: Corky Mull, MD;  Location: [facility];  Service: Orthopedics;  Laterality: Right;  . COLONOSCOPY  2011   cleared for 10 yrs  . COLONOSCOPY WITH PROPOFOL N/A 11/06/2016   Procedure: COLONOSCOPY WITH PROPOFOL;  Surgeon: [name], MD;  Location: [place];  Service: Endoscopy;  Laterality: N/A;  . CORONARY STENT INTERVENTION N/A 07/23/2017   Procedure: CORONARY STENT INTERVENTION;  Surgeon: Yolonda Kida, MD;  Location: [place] [facility];  Service: Cardiovascular;  Laterality: N/A;  . CYST EXCISION     Forearm  . HERNIA REPAIR    . KNEE SURGERY Bilateral    2 on R) 1 on L)  . LEFT HEART CATH AND CORONARY ANGIOGRAPHY N/A 07/23/2017   Procedure: LEFT HEART CATH AND CORONARY ANGIOGRAPHY;  Surgeon: Minna Merritts, MD;  Location: [place] [facility];  Service: Cardiovascular;  Laterality: N/A;  . POLYPECTOMY  11/06/2016   Procedure: POLYPECTOMY;  Surgeon: [name], MD;  Location: [place];  Service: Endoscopy;;  . SHOULDER ARTHROSCOPY WITH OPEN ROTATOR CUFF REPAIR Right 07/18/2018   Procedure: SHOULDER ARTHROSCOPY WITH OPEN ROTATOR CUFF REPAIR;  Surgeon: Corky Mull, MD;  Location: [facility];  Service: Orthopedics;  Laterality: Right;    Social History   Tobacco Use  . Smoking status: Former Research scientist (life sciences)  . Smokeless tobacco: Never Used  . Tobacco comment: quit over 20 yrs ago  Vaping Use  . Vaping Use: Never used  Substance Use Topics  . Alcohol use: Yes    Alcohol/week: 0.0 standard drinks    Comment: 1-2 drinks/year  . Drug use: No     Medication list has been reviewed and updated.  Current Meds  Medication Sig  . acetaminophen (TYLENOL) 500 MG tablet Take 500 mg by mouth at bedtime. As needed/ otc  . albuterol (VENTOLIN HFA) 108 (90 Base) MCG/ACT inhaler Inhale 1-2 puffs into the lungs every 6 (six) hours as needed for wheezing or shortness of breath.  Marland Kitchen aspirin EC 81 MG EC tablet Take 1 tablet (81 mg total) daily by mouth.  Marland Kitchen atorvastatin (LIPITOR) 80 MG tablet Take 1 tablet (80 mg total) by mouth daily at 6 PM.  . carvedilol (COREG) 6.25 MG tablet Take 1 tablet (6.25 mg total) by mouth 2 (two) times daily.  . clopidogrel (PLAVIX) 75 MG tablet Take 1 tablet (75 mg total) by mouth daily.  . diclofenac Sodium (VOLTAREN) 1 %  GEL Apply 2 g topically 4 (four) times daily.  . fexofenadine (ALLEGRA) 180 MG tablet Take 180 mg by mouth as needed for allergies or rhinitis. otc  . lamoTRIgine (LAMICTAL) 25 MG tablet Take by mouth. Wk1:1tab night wk2:1tab 2xday wk3:1tab AM 2tabsPM wk4:2tabs 2xday wk5:2tabs AM 3tabs PM wk6:3tabs 2xday wk7:3tabs AM 4tabs PM, wk8:4tab2xday- [place]  . levETIRAcetam (KEPPRA) 750 MG tablet Take 1 tablet by mouth 2 (two) times daily. [place]  . losartan (COZAAR) 50 MG tablet Take 1 tablet (50 mg total) by mouth daily.  . nitroGLYCERIN (NITROSTAT) 0.4 MG SL tablet Place 1 tablet (0.4 mg total) under the tongue  every 5 (five) minutes x 3 doses as needed for chest pain.  Marland Kitchen spironolactone (ALDACTONE) 25 MG tablet Take 1 tablet (25 mg total) by mouth daily.    PHQ 2/9 Scores 12/17/2020 11/10/2020 11/05/2020 06/09/2020  PHQ - 2 Score 0 0 0 0  PHQ- 9 Score 0 - 0 0    GAD 7 : Generalized Anxiety Score 12/17/2020 11/05/2020 06/09/2020 12/16/2019  Nervous, Anxious, on Edge 0 0 0 0  Control/stop worrying 0 0 0 0  Worry too much - different things 0 0 0 0  Trouble relaxing 0 0 0 0  Restless 0 0 0 0  Easily annoyed or irritable 1 0 0 0  Afraid - awful might happen 0 0 0 0  Total GAD 7 Score 1 0 0 0  Anxiety Difficulty Not difficult at all - - -    BP Readings from Last 3 Encounters:  12/17/20 (!) 140/100  11/12/20 (!) 140/96  11/05/20 140/90    Physical Exam Vitals and nursing note reviewed.  HENT:     Head: Normocephalic.     Right Ear: External ear normal.     Left Ear: External ear normal.     Nose: Nose normal. No congestion or rhinorrhea.  Eyes:     General: No scleral icterus.       Right eye: No discharge.        Left eye: No discharge.     Conjunctiva/sclera: Conjunctivae normal.     Pupils: Pupils are equal, round, and reactive to light.  Neck:     Thyroid: No thyromegaly.     Vascular: No JVD.     Trachea: No tracheal deviation.  Cardiovascular:     Rate and Rhythm: Normal rate and regular rhythm.     Heart sounds: Normal heart sounds. No murmur heard. No friction rub. No gallop.   Pulmonary:     Effort: No respiratory distress.     Breath sounds: Normal breath sounds. No wheezing, rhonchi or rales.  Abdominal:     General: Bowel sounds are normal.     Palpations: Abdomen is soft. There is no mass.     Tenderness: There is no abdominal tenderness. There is no guarding or rebound.  Musculoskeletal:        General: No tenderness. Normal range of motion.     Cervical back: Normal range of motion and neck supple.  Lymphadenopathy:     Cervical: No cervical adenopathy.  Skin:     General: Skin is warm.     Findings: No rash.  Neurological:     Mental Status: He is alert and oriented to person, place, and time.     Cranial Nerves: No cranial nerve deficit.     Deep Tendon Reflexes: Reflexes are normal and symmetric.     Wt Readings from Last 3 Encounters:  12/17/20 210 lb (95.3 kg)  11/12/20 212 lb (96.2 kg)  11/05/20 210 lb (95.3 kg)    BP (!) 140/100   Pulse 64   Ht 5\' 9"  (1.753 m)   Wt 210 lb (95.3 kg)   BMI 31.01 kg/m   Assessment and Plan:  1. Hyperlipidemia, unspecified hyperlipidemia type Chronic.  Controlled.  Stable.  Continue atorvastatin 80 mg once a day.  Will check lipid panel for current status of LDL. - atorvastatin (LIPITOR) 80 MG tablet; Take 1 tablet (80 mg total) by mouth daily at 6 PM.  Dispense: 90 tablet; Refill: 1 - Lipid Panel With LDL/HDL Ratio  2. CAD, multiple vessel Chronic.  Controlled.  Stable.  Continue CLO [place] 75 mg once a day. - clopidogrel (PLAVIX) 75 MG tablet; Take 1 tablet (75 mg total) by mouth daily.  Dispense: 90 tablet; Refill: 1  3. Primary hypertension Chronic.  Uncontrolled.  Stable.  Blood pressure today is 140/100.  We will increase losartan to 100 mg once a day.  We will also check renal function panel because of recently discontinuance of the potassium and is currently on potassium sparing spironolactone as well. - Renal Function Panel

## 2020-12-18 LAB — RENAL FUNCTION PANEL
Albumin: 4.3 g/dL (ref 3.7–4.7)
BUN/Creatinine Ratio: 15 (ref 10–24)
BUN: 17 mg/dL (ref 8–27)
CO2: 22 mmol/L (ref 20–29)
Calcium: 9.2 mg/dL (ref 8.6–10.2)
Chloride: 100 mmol/L (ref 96–106)
Creatinine, Ser: 1.16 mg/dL (ref 0.76–1.27)
Glucose: 88 mg/dL (ref 65–99)
Phosphorus: 3.1 mg/dL (ref 2.8–4.1)
Potassium: 4.2 mmol/L (ref 3.5–5.2)
Sodium: 137 mmol/L (ref 134–144)
eGFR: 67 mL/min/{1.73_m2} (ref >59)

## 2020-12-18 LAB — LIPID PANEL WITH LDL/HDL RATIO
Cholesterol, Total: 111 mg/dL (ref 100–199)
HDL: 51 mg/dL (ref >39)
LDL Chol Calc (NIH): 44 mg/dL (ref 0–99)
LDL/HDL Ratio: 0.9 ratio (ref 0.0–3.6)
Triglycerides: 76 mg/dL (ref 0–149)
VLDL Cholesterol Cal: 16 mg/dL (ref 5–40)

## 2020-12-19 ENCOUNTER — Other Ambulatory Visit: Payer: Self-pay | Admitting: Family Medicine

## 2020-12-19 DIAGNOSIS — I10 Essential (primary) hypertension: Secondary | ICD-10-CM

## 2021-02-16 ENCOUNTER — Other Ambulatory Visit: Payer: Self-pay | Admitting: Physician Assistant

## 2021-02-16 DIAGNOSIS — R9402 Abnormal brain scan: Secondary | ICD-10-CM

## 2021-03-01 ENCOUNTER — Ambulatory Visit
Admission: RE | Admit: 2021-03-01 | Discharge: 2021-03-01 | Disposition: A | Discharge status: Home / Self Care | Payer: Medicare HMO | Source: Ambulatory Visit | Attending: Physician Assistant | Admitting: Physician Assistant

## 2021-03-01 ENCOUNTER — Other Ambulatory Visit: Payer: Self-pay

## 2021-03-01 DIAGNOSIS — R9402 Abnormal brain scan: Secondary | ICD-10-CM | POA: Diagnosis not present

## 2021-03-01 MED ORDER — GADOBUTROL 1 MMOL/ML IV SOLN
10.0000 mL | Freq: Once | INTRAVENOUS | Status: AC | PRN
  Administered 2021-03-01: 10 mL via INTRAVENOUS

## 2021-03-20 ENCOUNTER — Other Ambulatory Visit: Payer: Self-pay | Admitting: Family Medicine

## 2021-03-20 NOTE — Telephone Encounter (Signed)
Requested Prescriptions  Pending Prescriptions Disp Refills  . nitroGLYCERIN (NITROSTAT) 0.4 MG SL tablet [Pharmacy Med Name: NITROGLYCERIN 0.4MG  SUB TAB 25S] 25 tablet 2    Sig: PLACE 1 TABLET UNDER THE TONGUE EVERY 5 MINUTES FOR 3 DOSES AS NEEDED FOR CHEST PAIN     Cardiovascular:  Nitrates Failed - 03/20/2021  1:22 PM      Failed - Last BP in normal range    BP Readings from Last 1 Encounters:  12/17/20 (!) 140/100         Passed - Last Heart Rate in normal range    Pulse Readings from Last 1 Encounters:  12/17/20 64         Passed - Valid encounter within last 12 months    Recent Outpatient Visits          3 months ago Primary hypertension   [facility] Juline Patch, MD   4 months ago Hospital discharge follow-up   [place], MD   9 months ago Essential hypertension   [place], MD   9 months ago Acute non-recurrent maxillary sinusitis   [facility] Juline Patch, MD   1 year ago Essential hypertension   [place], Deanna C, MD      Future Appointments            In 2 weeks Juline Patch, MD [facility], [place]   In 4 weeks Gollan, Kathlene November, MD [facility], [place]

## 2021-04-01 ENCOUNTER — Telehealth: Payer: Self-pay

## 2021-04-01 NOTE — Telephone Encounter (Signed)
Copied from [place] [id number]. Topic: General - Other >> Apr 01, 2021  2:39 PM Pawlus, Brayton Layman A wrote: Reason for CRM: Pt requested a call back from [facility], please advise.

## 2021-04-06 ENCOUNTER — Other Ambulatory Visit: Payer: Self-pay

## 2021-04-06 ENCOUNTER — Ambulatory Visit (INDEPENDENT_AMBULATORY_CARE_PROVIDER_SITE_OTHER): Payer: Medicare HMO | Admitting: Family Medicine

## 2021-04-06 ENCOUNTER — Encounter: Payer: Self-pay | Admitting: Family Medicine

## 2021-04-06 VITALS — BP 128/70 | HR 88 | Ht 69.0 in | Wt 210.0 lb

## 2021-04-06 DIAGNOSIS — I10 Essential (primary) hypertension: Secondary | ICD-10-CM | POA: Diagnosis not present

## 2021-04-06 DIAGNOSIS — E785 Hyperlipidemia, unspecified: Secondary | ICD-10-CM

## 2021-04-06 MED ORDER — LOSARTAN POTASSIUM 100 MG PO TABS: 100.0000 mg | ORAL_TABLET | Freq: Every day | ORAL | 1 refills | Status: DC

## 2021-04-06 NOTE — Progress Notes (Signed)
Date:  04/06/2021   Name:  George Doyle   DOB:  Sep 01, 1951   MRN:  [id number]   Chief Complaint: Hypertension (Increased to 100mg  last visit)  Hypertension This is a chronic problem. The current episode started more than 1 year ago. The problem has been gradually improving since onset. The problem is controlled. Associated symptoms include malaise/fatigue. Pertinent negatives include no anxiety, blurred vision, chest pain, headaches, neck pain, orthopnea, palpitations, peripheral edema, PND, shortness of breath or sweats.   Lab Results  Component Value Date   CREATININE 1.16 12/17/2020   BUN 17 12/17/2020   NA 137 12/17/2020   K 4.2 12/17/2020   CL 100 12/17/2020   CO2 22 12/17/2020   Lab Results  Component Value Date   CHOL 111 12/17/2020   HDL 51 12/17/2020   LDLCALC 44 12/17/2020   TRIG 76 12/17/2020   CHOLHDL 2.1 06/11/2018   No results found for: TSH No results found for: HGBA1C Lab Results  Component Value Date   WBC 8.5 11/05/2020   HGB 13.7 11/05/2020   HCT 39.0 11/05/2020   MCV 89 11/05/2020   PLT 326 11/05/2020   Lab Results  Component Value Date   ALT 9 12/16/2019   AST 19 12/16/2019   ALKPHOS 85 12/16/2019   BILITOT 0.8 12/16/2019     Review of Systems  Constitutional:  Positive for malaise/fatigue. Negative for chills and fever.  HENT:  Negative for drooling, ear discharge, ear pain and sore throat.   Eyes:  Negative for blurred vision.  Respiratory:  Negative for cough, shortness of breath and wheezing.   Cardiovascular:  Negative for chest pain, palpitations, orthopnea, leg swelling and PND.  Gastrointestinal:  Negative for abdominal pain, blood in stool, constipation, diarrhea and nausea.  Endocrine: Negative for polydipsia.  Genitourinary:  Negative for dysuria, frequency, hematuria and urgency.  Musculoskeletal:  Negative for back pain, myalgias and neck pain.  Skin:  Negative for rash.  Allergic/Immunologic: Negative for  environmental allergies.  Neurological:  Negative for dizziness and headaches.  Hematological:  Does not bruise/bleed easily.  Psychiatric/Behavioral:  Negative for suicidal ideas. The patient is not nervous/anxious.    Patient Active Problem List   Diagnosis Date Noted   Benign neoplasm of skin of left forearm 01/27/2019   Pure hypercholesterolemia 06/21/2018   Poorly-controlled hypertension 06/21/2018   Seizure disorder ([place]) 06/21/2018   Hill-Sachs lesion of right shoulder 06/19/2018   Injury of tendon of long head of right biceps 06/19/2018   Rotator cuff tendinitis, right 06/19/2018   Traumatic complete tear of right rotator cuff 06/19/2018   Headache disorder 05/09/2018   CAD (coronary artery disease), native coronary artery 03/28/2018   History of seizures 03/28/2018   Subdural hematoma ([place]) 03/27/2018   Syncope 03/27/2018   Old MI (myocardial infarction) 08/30/2017   CAD, multiple vessel 08/01/2017   NSTEMI (non-ST elevated myocardial infarction) ([place]) 07/20/2017   Hyperlipidemia 05/10/2017   Preop cardiovascular exam    Benign neoplasm of ascending colon    Polyp of sigmoid colon    Essential hypertension 03/03/2016    No Known Allergies  Past Surgical History:  Procedure Laterality Date   BICEPT TENODESIS Right 07/18/2018   Procedure: BICEPS TENODESIS;  Surgeon: Corky Mull, MD;  Location: [facility];  Service: Orthopedics;  Laterality: Right;   COLONOSCOPY  2011   cleared for 10 yrs   COLONOSCOPY WITH PROPOFOL N/A 11/06/2016   Procedure: COLONOSCOPY WITH PROPOFOL;  Surgeon: [name], MD;  Location: [place];  Service: Endoscopy;  Laterality: N/A;   CORONARY STENT INTERVENTION N/A 07/23/2017   Procedure: CORONARY STENT INTERVENTION;  Surgeon: Yolonda Kida, MD;  Location: [place] [facility];  Service: Cardiovascular;  Laterality: N/A;   CYST EXCISION     Forearm   HERNIA REPAIR     KNEE SURGERY Bilateral    2 on R) 1 on L)   LEFT HEART CATH  AND CORONARY ANGIOGRAPHY N/A 07/23/2017   Procedure: LEFT HEART CATH AND CORONARY ANGIOGRAPHY;  Surgeon: Minna Merritts, MD;  Location: [place] [facility];  Service: Cardiovascular;  Laterality: N/A;   POLYPECTOMY  11/06/2016   Procedure: POLYPECTOMY;  Surgeon: [name], MD;  Location: [place];  Service: Endoscopy;;   SHOULDER ARTHROSCOPY WITH OPEN ROTATOR CUFF REPAIR Right 07/18/2018   Procedure: SHOULDER ARTHROSCOPY WITH OPEN ROTATOR CUFF REPAIR;  Surgeon: Corky Mull, MD;  Location: [facility];  Service: Orthopedics;  Laterality: Right;    Social History   Tobacco Use   Smoking status: Former   Smokeless tobacco: Never   Tobacco comments:    quit over 20 yrs ago  Vaping Use   Vaping Use: Never used  Substance Use Topics   Alcohol use: Yes    Alcohol/week: 0.0 standard drinks    Comment: 1-2 drinks/year   Drug use: No     Medication list has been reviewed and updated.  Current Meds  Medication Sig   acetaminophen (TYLENOL) 500 MG tablet Take 500 mg by mouth at bedtime. As needed/ otc   albuterol (VENTOLIN HFA) 108 (90 Base) MCG/ACT inhaler Inhale 1-2 puffs into the lungs every 6 (six) hours as needed for wheezing or shortness of breath.   aspirin EC 81 MG EC tablet Take 1 tablet (81 mg total) daily by mouth.   atorvastatin (LIPITOR) 80 MG tablet Take 1 tablet (80 mg total) by mouth daily at 6 PM.   carvedilol (COREG) 6.25 MG tablet Take 1 tablet (6.25 mg total) by mouth 2 (two) times daily.   clopidogrel (PLAVIX) 75 MG tablet Take 1 tablet (75 mg total) by mouth daily.   diclofenac Sodium (VOLTAREN) 1 % GEL Apply 2 g topically 4 (four) times daily.   fexofenadine (ALLEGRA) 180 MG tablet Take 180 mg by mouth as needed for allergies or rhinitis. otc   lamoTRIgine (LAMICTAL) 200 MG tablet Take by mouth 2 (two) times daily. Wk1:1tab night wk2:1tab 2xday wk3:1tab AM 2tabsPM wk4:2tabs 2xday wk5:2tabs AM 3tabs PM wk6:3tabs 2xday wk7:3tabs AM 4tabs PM, wk8:4tab2xday-  [place]   losartan (COZAAR) 100 MG tablet Take 1 tablet (100 mg total) by mouth daily.   nitroGLYCERIN (NITROSTAT) 0.4 MG SL tablet PLACE 1 TABLET UNDER THE TONGUE EVERY 5 MINUTES FOR 3 DOSES AS NEEDED FOR CHEST PAIN   spironolactone (ALDACTONE) 25 MG tablet Take 1 tablet (25 mg total) by mouth daily. (Patient taking differently: Take 25 mg by mouth daily. Gollan)    PHQ 2/9 Scores 04/06/2021 12/17/2020 11/10/2020 11/05/2020  PHQ - 2 Score 0 0 0 0  PHQ- 9 Score 0 0 - 0    GAD 7 : Generalized Anxiety Score 04/06/2021 12/17/2020 11/05/2020 06/09/2020  Nervous, Anxious, on Edge 0 0 0 0  Control/stop worrying 0 0 0 0  Worry too much - different things 0 0 0 0  Trouble relaxing 0 0 0 0  Restless 0 0 0 0  Easily annoyed or irritable 0 1 0 0  Afraid - awful might happen  0 0 0 0  Total GAD 7 Score 0 1 0 0  Anxiety Difficulty - Not difficult at all - -    BP Readings from Last 3 Encounters:  04/06/21 128/70  12/17/20 (!) 140/100  11/12/20 (!) 140/96    Physical Exam Vitals and nursing note reviewed.  HENT:     Head: Normocephalic.     Right Ear: Tympanic membrane, ear canal and external ear normal.     Left Ear: Tympanic membrane, ear canal and external ear normal.     Nose: Nose normal. No congestion.     Mouth/Throat:     Mouth: Mucous membranes are moist.  Eyes:     General: No scleral icterus.       Right eye: No discharge.        Left eye: No discharge.     Conjunctiva/sclera: Conjunctivae normal.     Pupils: Pupils are equal, round, and reactive to light.  Neck:     Thyroid: No thyromegaly.     Vascular: No carotid bruit or JVD.     Trachea: No tracheal deviation.  Cardiovascular:     Rate and Rhythm: Normal rate and regular rhythm.     Heart sounds: Normal heart sounds. No murmur heard.   No friction rub. No gallop.  Pulmonary:     Effort: No respiratory distress.     Breath sounds: Normal breath sounds. No wheezing, rhonchi or rales.  Abdominal:     General: Bowel sounds  are normal.     Palpations: Abdomen is soft. There is no mass.     Tenderness: There is no abdominal tenderness. There is no guarding or rebound.  Musculoskeletal:        General: No tenderness. Normal range of motion.     Cervical back: Normal range of motion and neck supple.  Lymphadenopathy:     Cervical: No cervical adenopathy.  Skin:    General: Skin is warm.     Findings: No rash.  Neurological:     Mental Status: He is alert and oriented to person, place, and time.     Cranial Nerves: No cranial nerve deficit.     Deep Tendon Reflexes: Reflexes are normal and symmetric.    Wt Readings from Last 3 Encounters:  04/06/21 210 lb (95.3 kg)  12/17/20 210 lb (95.3 kg)  11/12/20 212 lb (96.2 kg)    BP 128/70 (BP Location: Left Arm, Cuff Size: Normal)   Pulse 88   Ht 5\' 9"  (1.753 m)   Wt 210 lb (95.3 kg)   BMI 31.01 kg/m   Assessment and Plan:  1. Primary hypertension Chronic.  Controlled.  Stable.  Blood pressure today is 128/70.  Continue with losartan 100 mg once a day.  We will recheck in 6 months.  We will check renal function panel before electrolytes particularly potassium since he is on spironolactone and losartan. - Renal Function Panel

## 2021-04-06 NOTE — Patient Instructions (Signed)
Managing Your Hypertension Hypertension, also called high blood pressure, is when the force of the blood pressing against the walls of the arteries is too strong. Arteries are blood vessels that carry blood from your heart throughout your body. Hypertension forces the heart to work harder to pump blood and may cause the arteries tobecome narrow or stiff. Understanding blood pressure readings Your personal target blood pressure may vary depending on your medical conditions, your age, and other factors. A blood pressure reading includes a higher number over a lower number. Ideally, your blood pressure should be below 120/80. You should know that: The first, or top, number is called the systolic pressure. It is a measure of the pressure in your arteries as your heart beats. The second, or bottom number, is called the diastolic pressure. It is a measure of the pressure in your arteries as the heart relaxes. Blood pressure is classified into four stages. Based on your blood pressure reading, your health care provider may use the following stages to determine what type of treatment you need, if any. Systolic pressure and diastolicpressure are measured in a unit called mmHg. Normal Systolic pressure: below 120. Diastolic pressure: below 80. Elevated Systolic pressure: 120-129. Diastolic pressure: below 80. Hypertension stage 1 Systolic pressure: 130-139. Diastolic pressure: 80-89. Hypertension stage 2 Systolic pressure: 140 or above. Diastolic pressure: 90 or above. How can this condition affect me? Managing your hypertension is an important responsibility. Over time, hypertension can damage the arteries and decrease blood flow to important parts of the body, including the brain, heart, and kidneys. Having untreated or uncontrolled hypertension can lead to: A heart attack. A stroke. A weakened blood vessel (aneurysm). Heart failure. Kidney damage. Eye damage. Metabolic syndrome. Memory and  concentration problems. Vascular dementia. What actions can I take to manage this condition? Hypertension can be managed by making lifestyle changes and possibly by taking medicines. Your health care provider will help you make a plan to bring yourblood pressure within a normal range. Nutrition  Eat a diet that is high in fiber and potassium, and low in salt (sodium), added sugar, and fat. An example eating plan is called the Dietary Approaches to Stop Hypertension (DASH) diet. To eat this way: Eat plenty of fresh fruits and vegetables. Try to fill one-half of your plate at each meal with fruits and vegetables. Eat whole grains, such as whole-wheat pasta, brown rice, or whole-grain bread. Fill about one-fourth of your plate with whole grains. Eat low-fat dairy products. Avoid fatty cuts of meat, processed or cured meats, and poultry with skin. Fill about one-fourth of your plate with lean proteins such as fish, chicken without skin, beans, eggs, and tofu. Avoid pre-made and processed foods. These tend to be higher in sodium, added sugar, and fat. Reduce your daily sodium intake. Most people with hypertension should eat less than 1,500 mg of sodium a day.  Lifestyle  Work with your health care provider to maintain a healthy body weight or to lose weight. Ask what an ideal weight is for you. Get at least 30 minutes of exercise that causes your heart to beat faster (aerobic exercise) most days of the week. Activities may include walking, swimming, or biking. Include exercise to strengthen your muscles (resistance exercise), such as weight lifting, as part of your weekly exercise routine. Try to do these types of exercises for 30 minutes at least 3 days a week. Do not use any products that contain nicotine or tobacco, such as cigarettes, e-cigarettes, and chewing   tobacco. If you need help quitting, ask your health care provider. Control any long-term (chronic) conditions you have, such as high  cholesterol or diabetes. Identify your sources of stress and find ways to manage stress. This may include meditation, deep breathing, or making time for fun activities.  Alcohol use Do not drink alcohol if: Your health care provider tells you not to drink. You are pregnant, may be pregnant, or are planning to become pregnant. If you drink alcohol: Limit how much you use to: 0-1 drink a day for women. 0-2 drinks a day for men. Be aware of how much alcohol is in your drink. In the [place] one drink equals one 12 oz bottle of beer (355 mL), one 5 oz glass of wine (148 mL), or one 1 oz glass of hard liquor (44 mL). Medicines Your health care provider may prescribe medicine if lifestyle changes are not enough to get your blood pressure under control and if: Your systolic blood pressure is 130 or higher. Your diastolic blood pressure is 80 or higher. Take medicines only as told by your health care provider. Follow the directions carefully. Blood pressure medicines must be taken as told by your health care provider. The medicine does not work as well when you skip doses. Skippingdoses also puts you at risk for problems. Monitoring Before you monitor your blood pressure: Do not smoke, drink caffeinated beverages, or exercise within 30 minutes before taking a measurement. Use the bathroom and empty your bladder (urinate). Sit quietly for at least 5 minutes before taking measurements. Monitor your blood pressure at home as told by your health care provider. To do this: Sit with your back straight and supported. Place your feet flat on the floor. Do not cross your legs. Support your arm on a flat surface, such as a table. Make sure your upper arm is at heart level. Each time you measure, take two or three readings one minute apart and record the results. You may also need to have your blood pressure checked regularly by your healthcare provider. General information Talk with your health care  provider about your diet, exercise habits, and other lifestyle factors that may be contributing to hypertension. Review all the medicines you take with your health care provider because there may be side effects or interactions. Keep all visits as told by your health care provider. Your health care provider can help you create and adjust your plan for managing your high blood pressure. Where to find more information National Heart, Lung, and Blood Institute: www.nhlbi.nih.gov American Heart Association: www.heart.org Contact a health care provider if: You think you are having a reaction to medicines you have taken. You have repeated (recurrent) headaches. You feel dizzy. You have swelling in your ankles. You have trouble with your vision. Get help right away if: You develop a severe headache or confusion. You have unusual weakness or numbness, or you feel faint. You have severe pain in your chest or abdomen. You vomit repeatedly. You have trouble breathing. These symptoms may represent a serious problem that is an emergency. Do not wait to see if the symptoms will go away. Get medical help right away. Call your local emergency services (911 in the [place] Do not drive yourself to the hospital. Summary Hypertension is when the force of blood pumping through your arteries is too strong. If this condition is not controlled, it may put you at risk for serious complications. Your personal target blood pressure may vary depending on your medical conditions,   your age, and other factors. For most people, a normal blood pressure is less than 120/80. Hypertension is managed by lifestyle changes, medicines, or both. Lifestyle changes to help manage hypertension include losing weight, eating a healthy, low-sodium diet, exercising more, stopping smoking, and limiting alcohol. This information is not intended to replace advice given to you by your health care provider. Make sure you discuss any questions  you have with your healthcare provider. Document Revised: 10/10/2019 Document Reviewed: 08/05/2019 Elsevier Patient Education  2022 Elsevier Inc.  

## 2021-04-07 LAB — RENAL FUNCTION PANEL
Albumin: 4.3 g/dL (ref 3.7–4.7)
BUN/Creatinine Ratio: 15 (ref 10–24)
BUN: 18 mg/dL (ref 8–27)
CO2: 24 mmol/L (ref 20–29)
Calcium: 9.1 mg/dL (ref 8.6–10.2)
Chloride: 100 mmol/L (ref 96–106)
Creatinine, Ser: 1.2 mg/dL (ref 0.76–1.27)
Glucose: 92 mg/dL (ref 65–99)
Phosphorus: 2.9 mg/dL (ref 2.8–4.1)
Potassium: 4.6 mmol/L (ref 3.5–5.2)
Sodium: 137 mmol/L (ref 134–144)
eGFR: 65 mL/min/{1.73_m2} (ref >59)

## 2021-04-18 ENCOUNTER — Other Ambulatory Visit: Payer: Self-pay

## 2021-04-18 ENCOUNTER — Ambulatory Visit (INDEPENDENT_AMBULATORY_CARE_PROVIDER_SITE_OTHER): Payer: Medicare HMO | Admitting: Cardiovascular Disease

## 2021-04-18 ENCOUNTER — Encounter: Payer: Self-pay | Admitting: Cardiovascular Disease

## 2021-04-18 VITALS — BP 130/70 | HR 51 | Ht 69.0 in | Wt 207.4 lb

## 2021-04-18 DIAGNOSIS — E785 Hyperlipidemia, unspecified: Secondary | ICD-10-CM

## 2021-04-18 DIAGNOSIS — I77819 Aortic ectasia, unspecified site: Secondary | ICD-10-CM

## 2021-04-18 DIAGNOSIS — I2510 Atherosclerotic heart disease of native coronary artery without angina pectoris: Secondary | ICD-10-CM | POA: Diagnosis not present

## 2021-04-18 DIAGNOSIS — I10 Essential (primary) hypertension: Secondary | ICD-10-CM | POA: Diagnosis not present

## 2021-04-18 DIAGNOSIS — I5020 Unspecified systolic (congestive) heart failure: Secondary | ICD-10-CM

## 2021-04-18 MED ORDER — NITROGLYCERIN 0.4 MG SL SUBL: SUBLINGUAL_TABLET | SUBLINGUAL | 3 refills | Status: DC

## 2021-04-18 MED ORDER — CLOPIDOGREL BISULFATE 75 MG PO TABS: 75.0000 mg | ORAL_TABLET | Freq: Every day | ORAL | 3 refills | Status: DC

## 2021-04-18 MED ORDER — LOSARTAN POTASSIUM 100 MG PO TABS: 100.0000 mg | ORAL_TABLET | Freq: Every day | ORAL | 3 refills | Status: DC

## 2021-04-18 MED ORDER — ATORVASTATIN CALCIUM 80 MG PO TABS: 80.0000 mg | ORAL_TABLET | Freq: Every day | ORAL | 3 refills | Status: DC

## 2021-04-18 MED ORDER — SPIRONOLACTONE 25 MG PO TABS: 25.0000 mg | ORAL_TABLET | Freq: Every day | ORAL | 3 refills | Status: DC

## 2021-04-18 MED ORDER — CARVEDILOL 6.25 MG PO TABS: 6.2500 mg | ORAL_TABLET | Freq: Two times a day (BID) | ORAL | 3 refills | Status: DC

## 2021-04-18 NOTE — Progress Notes (Signed)
Cardiology Office Note  Date:  04/18/2021   ID:  Jaquon Onate, DOB [birth year], MRN [id number]  PCP:  Juline Patch, MD   Chief Complaint  Patient presents with   6 month follow up     Patient c/o shortness of breath with exertion. Medications reviewed by the patient verbally.     HPI:  Mr. Brucato is a pleasant 70 yo male with history of  Seizure disorder 2005 in July 2019 HTN  former tobacco abuse  admitted with chest pain 07/21/2017 NSTEMI, TNT 14 Cath 07/23/2017  , Occluded proximal RCA, collaterals from left to right, Moderate to severe mid Left circumflex dz, tortuous vessel, calcified Moderate inferior wall hypokinesis  attempt PCI to the RCA was unsuccessful, possibly went subintimal Seen by Dr. Irish Lack August 30, 2017 Medical management recommended of his occluded RCA He presents for follow-up of his coronary artery disease, stable angina, preop cardiovascular  Last seen in clinic by myself February 2022  Sedentary, no exercise program  Weight running high, poor conditioning, poor diet  Chronic mild SOB  Remains on losartan 50 daily, metoprolol HCTZ changed to spironolactone  Blood pressure stable Denies anginal symptoms  Arthritic issues, shoulder, knee  Lab work reviewed Total cholesterol 111 LDL 44 Stable BMP  EKG personally reviewed by myself on todays visit Shows normal sinus rhythm rate 64 bpm ST abnormality V45 through V6, 1 and aVL old inferior MI  Discussed prior events with him  emergency room March 27, 2018 for seizure Hospital records reviewed with the patient in detail  full body tonic-clonic seizure-like activity, hurt his right shoulder at that time CT scan head subdural hematoma, Received IV Keppra.   was transferred to [facility]  Echo November 2018  ejection fraction was in the range of 50% to 55%. Aorta: Aortic root was mildly dilated, 3.6 cm - Ascending aorta: The ascending aorta was mildly dilated, 4.1  cm   PMH:   has a past medical history of Allergy, Arthritis, Hypertension, Myocardial infarction ([place]) (07/2017), Pneumonia, and Seizures ([place]).  PSH:    Past Surgical History:  Procedure Laterality Date   BICEPT TENODESIS Right 07/18/2018   Procedure: BICEPS TENODESIS;  Surgeon: Corky Mull, MD;  Location: [facility];  Service: Orthopedics;  Laterality: Right;   COLONOSCOPY  2011   cleared for 10 yrs   COLONOSCOPY WITH PROPOFOL N/A 11/06/2016   Procedure: COLONOSCOPY WITH PROPOFOL;  Surgeon: [name], MD;  Location: [place];  Service: Endoscopy;  Laterality: N/A;   CORONARY STENT INTERVENTION N/A 07/23/2017   Procedure: CORONARY STENT INTERVENTION;  Surgeon: Yolonda Kida, MD;  Location: [place] [facility];  Service: Cardiovascular;  Laterality: N/A;   CYST EXCISION     Forearm   HERNIA REPAIR     KNEE SURGERY Bilateral    2 on R) 1 on L)   LEFT HEART CATH AND CORONARY ANGIOGRAPHY N/A 07/23/2017   Procedure: LEFT HEART CATH AND CORONARY ANGIOGRAPHY;  Surgeon: Minna Merritts, MD;  Location: [place] [facility];  Service: Cardiovascular;  Laterality: N/A;   POLYPECTOMY  11/06/2016   Procedure: POLYPECTOMY;  Surgeon: [name], MD;  Location: [place];  Service: Endoscopy;;   SHOULDER ARTHROSCOPY WITH OPEN ROTATOR CUFF REPAIR Right 07/18/2018   Procedure: SHOULDER ARTHROSCOPY WITH OPEN ROTATOR CUFF REPAIR;  Surgeon: Corky Mull, MD;  Location: [facility];  Service: Orthopedics;  Laterality: Right;    Current Outpatient Medications  Medication Sig Dispense Refill  acetaminophen (TYLENOL) 500 MG tablet Take 500 mg by mouth at bedtime. As needed/ otc     albuterol (VENTOLIN HFA) 108 (90 Base) MCG/ACT inhaler Inhale 1-2 puffs into the lungs every 6 (six) hours as needed for wheezing or shortness of breath. 8 g 2   aspirin EC 81 MG EC tablet Take 1 tablet (81 mg total) daily by mouth. 30 tablet 1   atorvastatin (LIPITOR) 80 MG tablet Take 1 tablet (80  mg total) by mouth daily at 6 PM. 90 tablet 1   carvedilol (COREG) 6.25 MG tablet Take 1 tablet (6.25 mg total) by mouth 2 (two) times daily. 180 tablet 3   clopidogrel (PLAVIX) 75 MG tablet Take 1 tablet (75 mg total) by mouth daily. 90 tablet 1   diclofenac Sodium (VOLTAREN) 1 % GEL Apply 2 g topically 4 (four) times daily. 50 g 6   fexofenadine (ALLEGRA) 180 MG tablet Take 180 mg by mouth as needed for allergies or rhinitis. otc     lamoTRIgine (LAMICTAL) 200 MG tablet Take 200 mg by mouth daily.     losartan (COZAAR) 100 MG tablet Take 1 tablet (100 mg total) by mouth daily. 90 tablet 1   nitroGLYCERIN (NITROSTAT) 0.4 MG SL tablet PLACE 1 TABLET UNDER THE TONGUE EVERY 5 MINUTES FOR 3 DOSES AS NEEDED FOR CHEST PAIN 25 tablet 2   spironolactone (ALDACTONE) 25 MG tablet Take 1 tablet (25 mg total) by mouth daily. (Patient taking differently: Take 25 mg by mouth daily. Deondrea Markos) 90 tablet 3   lamoTRIgine (LAMICTAL) 200 MG tablet Take by mouth 2 (two) times daily. Wk1:1tab night wk2:1tab 2xday wk3:1tab AM 2tabsPM wk4:2tabs 2xday wk5:2tabs AM 3tabs PM wk6:3tabs 2xday wk7:3tabs AM 4tabs PM, wk8:4tab2xday- [place] (Patient not taking: Reported on 04/18/2021)     No current facility-administered medications for this visit.    Allergies:   Patient has no known allergies.   Social History:  The patient  reports that he has quit smoking. He has never used smokeless tobacco. He reports current alcohol use. He reports that he does not use drugs.   Family History:   family history includes CAD in his father; Heart failure in his mother; Lung cancer in his father.   Review of Systems: Review of Systems  Constitutional: Negative.   Respiratory:  Positive for shortness of breath.   Cardiovascular: Negative.   Gastrointestinal: Negative.   Musculoskeletal: Negative.   Neurological: Negative.   Psychiatric/Behavioral: Negative.    All other systems reviewed and are negative. PHYSICAL EXAM: VS:  BP 130/70  (BP Location: Left Arm, Patient Position: Sitting, Cuff Size: Normal)   Pulse (!) 51   Ht '5\' 9"'$  (1.753 m)   Wt 207 lb 6 oz (94.1 kg)   SpO2 98%   BMI 30.62 kg/m  , BMI Body mass index is 30.62 kg/m. Constitutional:  oriented to person, place, and time. No distress.  HENT:  Head: Grossly normal Eyes:  no discharge. No scleral icterus.  Neck: No JVD, no carotid bruits  Cardiovascular: Regular rate and rhythm, no murmurs appreciated Pulmonary/Chest: Clear to auscultation bilaterally, no wheezes or rails Abdominal: Soft.  no distension.  no tenderness.  Musculoskeletal: Normal range of motion Neurological:  normal muscle tone. Coordination normal. No atrophy Skin: Skin warm and dry Psychiatric: normal affect, pleasant    Recent Labs: 11/05/2020: Hemoglobin 13.7; Platelets 326 04/06/2021: BUN 18; Creatinine, Ser 1.20; Potassium 4.6; Sodium 137    Lipid Panel Lab Results  Component Value Date  CHOL 111 12/17/2020   HDL 51 12/17/2020   LDLCALC 44 12/17/2020   TRIG 76 12/17/2020    Wt Readings from Last 3 Encounters:  04/18/21 207 lb 6 oz (94.1 kg)  04/06/21 210 lb (95.3 kg)  12/17/20 210 lb (95.3 kg)      ASSESSMENT AND PLAN:  Essential hypertension  Blood pressure is well controlled on today's visit. No changes made to the medications.  CAD, multiple vessel Moderate to severe left circumflex disease tortuous vessel, occluded RCA  stable angina symptoms,  Chronic shortness of breath, Prior catheterization November 2018 Occluded RCA collaterals from left to right Also 60% proximal to mid left circumflex disease Denies anginal symptoms, no further testing Cholesterol at goal, nondiabetic, non-smoker  Mixed hyperlipidemia Cholesterol is at goal on the current lipid regimen. No changes to the medications were made.  Seizures Previous hospital admissions discussed with him from July 2019 No recent seizures    Total encounter time more than 25 minutes  Greater  than 50% was spent in counseling and coordination of care with the patient  No orders of the defined types were placed in this encounter.    Signed, Esmond Plants, M.D., Ph.D. 04/18/2021  [facility] [place], [place] [phone]

## 2021-04-18 NOTE — Patient Instructions (Addendum)
Medication Instructions:  No changes  If you need a refill on your cardiac medications before your next appointment, please call your pharmacy.   Lab work: No new labs needed  Testing/Procedures: No new testing needed  Follow-Up: At [facility], you and your health needs are our priority.  As part of our continuing mission to provide you with exceptional heart care, we have created designated Provider Care Teams.  These Care Teams include your primary Cardiologist (physician) and Advanced Practice Providers (APPs -  Physician Assistants and Nurse Practitioners) who all work together to provide you with the care you need, when you need it.  You will need a follow up appointment in 12 months  Providers on your designated Care Team:   Christopher Berge, NP Ryan Dunn, PA-C Jacquelyn Visser, PA-C Cadence Furth, PA-C  COVID-19 Vaccine Information can be found at: [email or web address] For questions related to vaccine distribution or appointments, please email [email or web address] or call [phone].    

## 2021-07-03 ENCOUNTER — Other Ambulatory Visit: Payer: Self-pay | Admitting: Family Medicine

## 2021-07-03 DIAGNOSIS — E785 Hyperlipidemia, unspecified: Secondary | ICD-10-CM

## 2021-07-03 NOTE — Telephone Encounter (Signed)
Last RF 04/18/21 #90 3 RF by Dr Rockey Situ pt's cardiologist

## 2021-07-04 ENCOUNTER — Other Ambulatory Visit: Payer: Self-pay | Admitting: Family Medicine

## 2021-07-04 DIAGNOSIS — E785 Hyperlipidemia, unspecified: Secondary | ICD-10-CM

## 2021-07-04 DIAGNOSIS — I2510 Atherosclerotic heart disease of native coronary artery without angina pectoris: Secondary | ICD-10-CM

## 2021-07-15 ENCOUNTER — Emergency Department
Admission: EM | Admit: 2021-07-15 | Discharge: 2021-07-16 | Disposition: A | Discharge status: Home / Self Care | Payer: Medicare HMO | Attending: Emergency Medicine | Admitting: Emergency Medicine

## 2021-07-15 ENCOUNTER — Emergency Department: Payer: Medicare HMO

## 2021-07-15 ENCOUNTER — Other Ambulatory Visit: Payer: Self-pay

## 2021-07-15 DIAGNOSIS — I2510 Atherosclerotic heart disease of native coronary artery without angina pectoris: Secondary | ICD-10-CM | POA: Diagnosis not present

## 2021-07-15 DIAGNOSIS — R109 Unspecified abdominal pain: Secondary | ICD-10-CM | POA: Diagnosis present

## 2021-07-15 DIAGNOSIS — Z87891 Personal history of nicotine dependence: Secondary | ICD-10-CM | POA: Diagnosis not present

## 2021-07-15 DIAGNOSIS — N200 Calculus of kidney: Secondary | ICD-10-CM | POA: Diagnosis not present

## 2021-07-15 DIAGNOSIS — Z7902 Long term (current) use of antithrombotics/antiplatelets: Secondary | ICD-10-CM | POA: Insufficient documentation

## 2021-07-15 DIAGNOSIS — Z79899 Other long term (current) drug therapy: Secondary | ICD-10-CM | POA: Insufficient documentation

## 2021-07-15 DIAGNOSIS — I10 Essential (primary) hypertension: Secondary | ICD-10-CM | POA: Insufficient documentation

## 2021-07-15 DIAGNOSIS — Z7982 Long term (current) use of aspirin: Secondary | ICD-10-CM | POA: Insufficient documentation

## 2021-07-15 LAB — LIPASE, BLOOD: Lipase: 33 U/L (ref 11–51)

## 2021-07-15 LAB — URINALYSIS, ROUTINE W REFLEX MICROSCOPIC
Bacteria, UA: NONE SEEN
Bilirubin Urine: NEGATIVE
Glucose, UA: NEGATIVE mg/dL
Ketones, ur: 20 mg/dL — AB
Leukocytes,Ua: NEGATIVE
Nitrite: NEGATIVE
Protein, ur: 30 mg/dL — AB
Specific Gravity, Urine: 1.019 (ref 1.005–1.030)
Squamous Epithelial / HPF: NONE SEEN (ref 0–5)
pH: 6 (ref 5.0–8.0)

## 2021-07-15 LAB — COMPREHENSIVE METABOLIC PANEL
ALT: 17 U/L (ref 0–44)
AST: 24 U/L (ref 15–41)
Albumin: 4.5 g/dL (ref 3.5–5.0)
Alkaline Phosphatase: 69 U/L (ref 38–126)
Anion gap: 12 (ref 5–15)
BUN: 25 mg/dL — ABNORMAL HIGH (ref 8–23)
CO2: 23 mmol/L (ref 22–32)
Calcium: 9.4 mg/dL (ref 8.9–10.3)
Chloride: 95 mmol/L — ABNORMAL LOW (ref 98–111)
Creatinine, Ser: 1.4 mg/dL — ABNORMAL HIGH (ref 0.61–1.24)
GFR, Estimated: 53 mL/min — ABNORMAL LOW (ref >60)
Glucose, Bld: 122 mg/dL — ABNORMAL HIGH (ref 70–99)
Potassium: 4.3 mmol/L (ref 3.5–5.1)
Sodium: 130 mmol/L — ABNORMAL LOW (ref 135–145)
Total Bilirubin: 1.3 mg/dL — ABNORMAL HIGH (ref 0.3–1.2)
Total Protein: 7.6 g/dL (ref 6.5–8.1)

## 2021-07-15 LAB — CBC
HCT: 40.3 % (ref 39.0–52.0)
Hemoglobin: 13.9 g/dL (ref 13.0–17.0)
MCH: 31.2 pg (ref 26.0–34.0)
MCHC: 34.5 g/dL (ref 30.0–36.0)
MCV: 90.4 fL (ref 80.0–100.0)
Platelets: 245 10*3/uL (ref 150–400)
RBC: 4.46 MIL/uL (ref 4.22–5.81)
RDW: 12.4 % (ref 11.5–15.5)
WBC: 16.3 10*3/uL — ABNORMAL HIGH (ref 4.0–10.5)
nRBC: 0 % (ref 0.0–0.2)

## 2021-07-15 LAB — TROPONIN I (HIGH SENSITIVITY): Troponin I (High Sensitivity): 10 ng/L (ref <18)

## 2021-07-15 MED ORDER — HYDROMORPHONE HCL 1 MG/ML IJ SOLN
1.0000 mg | Freq: Once | INTRAMUSCULAR | Status: AC
Start: 2021-07-15 — End: 2021-07-15
  Administered 2021-07-15: 1 mg via INTRAVENOUS
  Filled 2021-07-15: qty 1

## 2021-07-15 MED ORDER — FENTANYL CITRATE PF 50 MCG/ML IJ SOSY
100.0000 ug | PREFILLED_SYRINGE | Freq: Once | INTRAMUSCULAR | Status: AC
Start: 2021-07-15 — End: 2021-07-15
  Administered 2021-07-15: 100 ug via INTRAVENOUS
  Filled 2021-07-15: qty 2

## 2021-07-15 MED ORDER — ONDANSETRON HCL 4 MG/2ML IJ SOLN
4.0000 mg | Freq: Once | INTRAMUSCULAR | Status: AC
  Administered 2021-07-16: 4 mg via INTRAVENOUS
  Filled 2021-07-15: qty 2

## 2021-07-15 MED ORDER — SODIUM CHLORIDE 0.9 % IV BOLUS
1000.0000 mL | Freq: Once | INTRAVENOUS | Status: AC
Start: 2021-07-15 — End: 2021-07-15
  Administered 2021-07-15: 1000 mL via INTRAVENOUS

## 2021-07-15 MED ORDER — HYDROMORPHONE HCL 1 MG/ML IJ SOLN
1.0000 mg | Freq: Once | INTRAMUSCULAR | Status: AC
  Administered 2021-07-16: 1 mg via INTRAVENOUS
  Filled 2021-07-15: qty 1

## 2021-07-15 MED ORDER — LACTATED RINGERS IV BOLUS
1000.0000 mL | Freq: Once | INTRAVENOUS | Status: AC
  Administered 2021-07-15: 1000 mL via INTRAVENOUS

## 2021-07-15 NOTE — ED Notes (Signed)
Patient's wife stopped this RN in hallway due to patient's pain level and discomfort.  Asking for updated.  Charge RN aware.  ED MD made aware and medications ordered. Will update patient

## 2021-07-15 NOTE — Discharge Instructions (Addendum)

## 2021-07-15 NOTE — ED Triage Notes (Signed)
First NUrse Note:  Arrives via [facility].  C/O LLQ pain x 4 hours.  BM today.  VS wnl

## 2021-07-15 NOTE — ED Notes (Signed)
Moved to subwaiting area.  Additional orderd received from CB triplett.

## 2021-07-15 NOTE — ED Notes (Signed)
Secure msg sent to charge RN re: pt's wife's concerns.

## 2021-07-15 NOTE — ED Triage Notes (Signed)
Pt to ED via [facility] for lower back pain and LLQ pain that started at 1000 this am. Family reports started in left shoulder blade.  Pt appears uncomfortable in triage.  Emesis x4 in past 24 hours.  Denies shob, chest pain, urinary sx

## 2021-07-15 NOTE — ED Provider Notes (Signed)
Emergency Medicine Provider Triage Evaluation Note  George Doyle , a 70 y.o. male  was evaluated in triage.  Pt c/o pain that started around 10am. Initially behind left shoulder blade then went into left flank and left lower back. Vomited 4 times in the past 24 hours. No fever, chest pain, shortness of breath, urinary symptoms.  Review of Systems  Positive: Back pain, fever Negative: fever  Physical Exam  BP (!) 217/90 Comment: hx HTN  Pulse (!) 54   Temp 98.2 F (36.8 C) (Oral)   Resp 20   Ht 5\' 9"  (1.753 m)   Wt 93 kg   SpO2 100%   BMI 30.27 kg/m  Gen:   Awake, no distress   Resp:  Normal effort  MSK:   Moves extremities without difficulty  Other:    Medical Decision Making  Medically screening exam initiated at 3:12 PM.  Appropriate orders placed.  George Doyle was informed that the remainder of the evaluation will be completed by another provider, this initial triage assessment does not replace that evaluation, and the importance of remaining in the ED until their evaluation is complete.   Victorino Dike, FNP 07/15/21 1517    Rada Hay, MD 07/15/21 2012

## 2021-07-16 MED ORDER — OXYCODONE-ACETAMINOPHEN 5-325 MG PO TABS: 2.0000 | ORAL_TABLET | Freq: Four times a day (QID) | ORAL | 0 refills | Status: DC | PRN

## 2021-07-16 MED ORDER — ONDANSETRON 4 MG PO TBDP: 4.0000 mg | ORAL_TABLET | Freq: Four times a day (QID) | ORAL | 0 refills | Status: DC | PRN

## 2021-07-16 MED ORDER — TAMSULOSIN HCL 0.4 MG PO CAPS: 0.4000 mg | ORAL_CAPSULE | Freq: Every day | ORAL | 0 refills | Status: DC

## 2021-07-16 NOTE — ED Provider Notes (Signed)
[facility] Emergency Department Provider Note  ____________________________________________   Event Date/Time   First MD Initiated Contact with Patient 07/15/21 2318     (approximate)  I have reviewed the triage vital signs and the nursing notes.   HISTORY  Chief Complaint Abdominal Pain    HPI George Doyle is a 70 y.o. male with history of CAD, hypertension, hyperlipidemia, seizures who presents to the emergency department with complaints of left flank pain that radiated to the left abdomen that started today at 10 AM.  Has had nausea and vomiting.  No diarrhea.  No fevers, dysuria, hematuria.  No chest pain or shortness of breath.  No previous history of kidney stones.        Past Medical History:  Diagnosis Date   Allergy    Arthritis    knees, hands   Hypertension    Myocardial infarction ([place]) 07/2017   Pneumonia    Seizures ([place])    approx 1970's, 2005, June 2019  Testing showed no cause. Extended EEG scheduled for November 2019    Patient Active Problem List   Diagnosis Date Noted   Benign neoplasm of skin of left forearm 01/27/2019   Pure hypercholesterolemia 06/21/2018   Poorly-controlled hypertension 06/21/2018   Seizure disorder ([place]) 06/21/2018   Hill-Sachs lesion of right shoulder 06/19/2018   Injury of tendon of long head of right biceps 06/19/2018   Rotator cuff tendinitis, right 06/19/2018   Traumatic complete tear of right rotator cuff 06/19/2018   Headache disorder 05/09/2018   CAD (coronary artery disease), native coronary artery 03/28/2018   History of seizures 03/28/2018   Subdural hematoma 03/27/2018   Syncope 03/27/2018   Old MI (myocardial infarction) 08/30/2017   CAD, multiple vessel 08/01/2017   NSTEMI (non-ST elevated myocardial infarction) ([place]) 07/20/2017   Hyperlipidemia 05/10/2017   Preop cardiovascular exam    Benign neoplasm of ascending colon    Polyp of sigmoid colon    Essential  hypertension 03/03/2016    Past Surgical History:  Procedure Laterality Date   BICEPT TENODESIS Right 07/18/2018   Procedure: BICEPS TENODESIS;  Surgeon: Corky Mull, MD;  Location: [facility];  Service: Orthopedics;  Laterality: Right;   COLONOSCOPY  2011   cleared for 10 yrs   COLONOSCOPY WITH PROPOFOL N/A 11/06/2016   Procedure: COLONOSCOPY WITH PROPOFOL;  Surgeon: [name], MD;  Location: [place];  Service: Endoscopy;  Laterality: N/A;   CORONARY STENT INTERVENTION N/A 07/23/2017   Procedure: CORONARY STENT INTERVENTION;  Surgeon: Yolonda Kida, MD;  Location: [place] [facility];  Service: Cardiovascular;  Laterality: N/A;   CYST EXCISION     Forearm   HERNIA REPAIR     KNEE SURGERY Bilateral    2 on R) 1 on L)   LEFT HEART CATH AND CORONARY ANGIOGRAPHY N/A 07/23/2017   Procedure: LEFT HEART CATH AND CORONARY ANGIOGRAPHY;  Surgeon: Minna Merritts, MD;  Location: [place] [facility];  Service: Cardiovascular;  Laterality: N/A;   POLYPECTOMY  11/06/2016   Procedure: POLYPECTOMY;  Surgeon: [name], MD;  Location: [place];  Service: Endoscopy;;   SHOULDER ARTHROSCOPY WITH OPEN ROTATOR CUFF REPAIR Right 07/18/2018   Procedure: SHOULDER ARTHROSCOPY WITH OPEN ROTATOR CUFF REPAIR;  Surgeon: Corky Mull, MD;  Location: [facility];  Service: Orthopedics;  Laterality: Right;    Prior to Admission medications   Medication Sig Start Date End Date Taking? Authorizing Provider  ondansetron (ZOFRAN ODT) 4 MG disintegrating  tablet Take 1 tablet (4 mg total) by mouth every 6 (six) hours as needed for nausea or vomiting. 07/16/21  Yes Corryn Madewell, Delice Bison, DO  oxyCODONE-acetaminophen (PERCOCET) 5-325 MG tablet Take 2 tablets by mouth every 6 (six) hours as needed for severe pain. 07/16/21 07/16/22 Yes Chistine Dematteo, Delice Bison, DO  tamsulosin (FLOMAX) 0.4 MG CAPS capsule Take 1 capsule (0.4 mg total) by mouth daily. 07/16/21  Yes Cohen Doleman, Delice Bison, DO  acetaminophen (TYLENOL) 500  MG tablet Take 500 mg by mouth at bedtime. As needed/ otc    [provider]  albuterol (VENTOLIN HFA) 108 (90 Base) MCG/ACT inhaler Inhale 1-2 puffs into the lungs every 6 (six) hours as needed for wheezing or shortness of breath. 10/19/20   Minna Merritts, MD  aspirin EC 81 MG EC tablet Take 1 tablet (81 mg total) daily by mouth. 07/24/17   Gladstone Lighter, MD  atorvastatin (LIPITOR) 80 MG tablet TAKE 1 TABLET(80 MG) BY MOUTH DAILY AT 6 PM 07/05/21   Juline Patch, MD  carvedilol (COREG) 6.25 MG tablet Take 1 tablet (6.25 mg total) by mouth 2 (two) times daily. 04/18/21 04/13/22  Minna Merritts, MD  clopidogrel (PLAVIX) 75 MG tablet TAKE 1 TABLET(75 MG) BY MOUTH DAILY 07/05/21   Juline Patch, MD  diclofenac Sodium (VOLTAREN) 1 % GEL Apply 2 g topically 4 (four) times daily. 12/16/19   Juline Patch, MD  fexofenadine (ALLEGRA) 180 MG tablet Take 180 mg by mouth as needed for allergies or rhinitis. otc    [provider]  lamoTRIgine (LAMICTAL) 200 MG tablet Take by mouth 2 (two) times daily. Wk1:1tab night wk2:1tab 2xday wk3:1tab AM 2tabsPM wk4:2tabs 2xday wk5:2tabs AM 3tabs PM wk6:3tabs 2xday wk7:3tabs AM 4tabs PM, wk8:4tab2xday- [place] Patient not taking: Reported on 04/18/2021 11/09/20   [provider]  lamoTRIgine (LAMICTAL) 200 MG tablet Take 200 mg by mouth daily.    [provider]  losartan (COZAAR) 100 MG tablet Take 1 tablet (100 mg total) by mouth daily. 04/18/21   Minna Merritts, MD  nitroGLYCERIN (NITROSTAT) 0.4 MG SL tablet PLACE 1 TABLET UNDER THE TONGUE EVERY 5 MINUTES FOR 3 DOSES AS NEEDED FOR CHEST PAIN 04/18/21   Minna Merritts, MD  spironolactone (ALDACTONE) 25 MG tablet Take 1 tablet (25 mg total) by mouth daily. 04/18/21 04/13/22  Minna Merritts, MD    Allergies Patient has no known allergies.  Family History  Problem Relation Age of Onset   CAD Father    Lung cancer Father    Heart failure Mother     Social History Social  History   Tobacco Use   Smoking status: Former   Smokeless tobacco: Never   Tobacco comments:    quit over 20 yrs ago  Vaping Use   Vaping Use: Never used  Substance Use Topics   Alcohol use: Yes    Alcohol/week: 0.0 standard drinks    Comment: 1-2 drinks/year   Drug use: No    Review of Systems Constitutional: No fever. Eyes: No visual changes. ENT: No sore throat. Cardiovascular: Denies chest pain. Respiratory: Denies shortness of breath. Gastrointestinal: + nausea, vomiting.  No diarrhea. Genitourinary: Negative for dysuria. Musculoskeletal: + for back pain. Skin: Negative for rash. Neurological: Negative for focal weakness or numbness.  ____________________________________________   PHYSICAL EXAM:  VITAL SIGNS: ED Triage Vitals  Enc Vitals Group     BP 07/15/21 1500 (!) 217/90     Pulse Rate 07/15/21 1500 Marland Kitchen)  54     Resp 07/15/21 1500 20     Temp 07/15/21 1500 98.2 F (36.8 C)     Temp Source 07/15/21 1500 Oral     SpO2 07/15/21 1500 100 %     Weight 07/15/21 1501 205 lb (93 kg)     Height 07/15/21 1501 5\' 9"  (1.753 m)     Head Circumference --      Peak Flow --      Pain Score 07/15/21 1501 9     Pain Loc --      Pain Edu? --      Excl. in [place]? --    CONSTITUTIONAL: Alert and oriented and responds appropriately to questions. Well-appearing; well-nourished HEAD: Normocephalic EYES: Conjunctivae clear, pupils appear equal, EOM appear intact ENT: normal nose; moist mucous membranes NECK: Supple, normal ROM CARD: RRR; S1 and S2 appreciated; no murmurs, no clicks, no rubs, no gallops RESP: Normal chest excursion without splinting or tachypnea; breath sounds clear and equal bilaterally; no wheezes, no rhonchi, no rales, no hypoxia or respiratory distress, speaking full sentences ABD/GI: Normal bowel sounds; non-distended; soft, non-tender, no rebound, no guarding, no peritoneal signs, no hepatosplenomegaly BACK: The back appears normal, no CVA  tenderness EXT: Normal ROM in all joints; no deformity noted, no edema; no cyanosis SKIN: Normal color for age and race; warm; no rash on exposed skin NEURO: Moves all extremities equally PSYCH: [name] mood and manner are appropriate.  ____________________________________________   LABS (all labs ordered are listed, but only abnormal results are displayed)  Labs Reviewed  CBC - Abnormal; Notable for the following components:      Result Value   WBC 16.3 (*)    All other components within normal limits  COMPREHENSIVE METABOLIC PANEL - Abnormal; Notable for the following components:   Sodium 130 (*)    Chloride 95 (*)    Glucose, Bld 122 (*)    BUN 25 (*)    Creatinine, Ser 1.40 (*)    Total Bilirubin 1.3 (*)    GFR, Estimated 53 (*)    All other components within normal limits  URINALYSIS, ROUTINE W REFLEX MICROSCOPIC - Abnormal; Notable for the following components:   Color, Urine YELLOW (*)    APPearance CLEAR (*)    Hgb urine dipstick MODERATE (*)    Ketones, ur 20 (*)    Protein, ur 30 (*)    All other components within normal limits  URINE CULTURE  LIPASE, BLOOD  TROPONIN I (HIGH SENSITIVITY)   ____________________________________________  EKG   EKG Interpretation  Date/Time:  Friday July 15 2021 15:07:00 EDT Ventricular Rate:  56 PR Interval:  258 QRS Duration: 98 QT Interval:  450 QTC Calculation: 434 R Axis:   44 Text Interpretation: Sinus bradycardia with 1st degree A-V block Possible Lateral infarct , age undetermined ST & T wave abnormality, consider inferior ischemia Abnormal ECG Confirmed by Pryor Curia [phone]) on 07/15/2021 11:41:03 PM        ____________________________________________  RADIOLOGY Jessie Foot Persephone Schriever, personally viewed and evaluated these images (plain radiographs) as part of my medical decision making, as well as reviewing the written report by the radiologist.  ED MD interpretation: Chest x-ray shows no acute  abnormality.  CT renal study shows distal left ureteral stone.  Official radiology report(s): DG Chest 2 View  Result Date: 07/15/2021 CLINICAL DATA:  Chest pain EXAM: CHEST - 2 VIEW COMPARISON:  Radiograph 06/09/2020 FINDINGS: Unchanged cardiomediastinal silhouette. Elevated right hemidiaphragm with right basilar  atelectasis, unchanged. There is no new airspace disease. There is no pleural effusion or visible pneumothorax. Old right rib injuries. Thoracic spondylosis. No acute osseous abnormality. IMPRESSION: Unchanged elevated right hemidiaphragm with right basilar atelectasis. No new airspace disease or other acute cardiopulmonary abnormality. Electronically Signed   By: Maurine Simmering M.D.   On: 07/15/2021 15:38   CT Renal Stone Study  Result Date: 07/15/2021 CLINICAL DATA:  Acute left lower quadrant abdominal pain. EXAM: CT ABDOMEN AND PELVIS WITHOUT CONTRAST TECHNIQUE: Multidetector CT imaging of the abdomen and pelvis was performed following the standard protocol without IV contrast. COMPARISON:  None. FINDINGS: Lower chest: No acute abnormality. Hepatobiliary: No focal liver abnormality is seen. No gallstones, gallbladder wall thickening, or biliary dilatation. Pancreas: Unremarkable. No pancreatic ductal dilatation or surrounding inflammatory changes. Spleen: Normal in size without focal abnormality. Adrenals/Urinary Tract: Adrenal glands appear normal. Bilateral nephrolithiasis is noted. Mild left hydroureteronephrosis is noted with perinephric stranding secondary to 4 mm calculus in the distal left ureter. Urinary bladder is unremarkable. Stomach/Bowel: Stomach is within normal limits. Appendix appears normal. No evidence of bowel wall thickening, distention, or inflammatory changes. Vascular/Lymphatic: Aortic atherosclerosis. No enlarged abdominal or pelvic lymph nodes. Reproductive: Prostate is unremarkable. Other: No ascites is noted. Mild bilateral fat containing inguinal hernias are noted.  Small fat containing periumbilical hernia is noted. Musculoskeletal: No acute or significant osseous findings. IMPRESSION: Bilateral nephrolithiasis. Mild left hydroureteronephrosis is noted with perinephric stranding secondary to 4 mm calculus in distal left ureter. Mild bilateral fat containing inguinal hernias are noted. Small fat containing periumbilical hernia. Aortic Atherosclerosis (ICD10-I70.0). Electronically Signed   By: Marijo Conception M.D.   On: 07/15/2021 18:25    ____________________________________________   PROCEDURES  Procedure(s) performed (including Critical Care):  Procedures    ____________________________________________   INITIAL IMPRESSION / ASSESSMENT AND PLAN / ED COURSE  As part of my medical decision making, I reviewed the following data within the [place] History obtained from family, Nursing notes reviewed and incorporated, Labs reviewed , EKG interpreted , Old EKG reviewed, Old chart reviewed, Radiograph reviewed , CT reviewed, Notes from prior ED visits, and [place] Controlled Substance Database         Patient here with complaints of left flank pain that radiates into the left abdomen.  Work-up initiated in triage.  Labs show a leukocytosis of 16,000 which is likely reactive.  He has no fever.  CT scan shows a distal 4 mm left ureteral stone with perinephric stranding.  Urine shows small amount of blood but no other sign of infection.  We will add on a urine culture given the leukocytosis but low suspicion for superimposed infection.  I do not feel he needs antibiotics today.  Patient reports improvement in pain after Dilaudid but states pain is starting to come back.  Will give Dilaudid, Zofran and reassess.  He was given IV fluids in the emergency department and has been able to urinate.  We will also p.o. challenge.  Patient also had a cardiac work-up initiated likely due to complaining of pain around the posterior left shoulder blade.   Denies any chest pain, shortness of breath.  EKG shows no significant changes compared to previous.  Chest x-ray reviewed by myself and radiology shows no acute abnormality.  Troponin negative.  Low suspicion for ACS, PE, dissection.  I do not feel he needs a second troponin at this time.  ED PROGRESS  Patient reports feeling better.  He declines any further pain  medication in the ED.  Able to tolerate p.o.  Will discharge home with pain and nausea medicine, Flomax, urology follow-up, diet recommendations, urine strainer.  Patient and family are comfortable with this plan.  Discussed at length return precautions.   At this time, I do not feel there is any life-threatening condition present. I have reviewed, interpreted and discussed all results (EKG, imaging, lab, urine as appropriate) and exam findings with patient/family. I have reviewed nursing notes and appropriate previous records.  I feel the patient is safe to be discharged home without further emergent workup and can continue workup as an outpatient as needed. Discussed usual and customary return precautions. Patient/family verbalize understanding and are comfortable with this plan.  Outpatient follow-up has been provided as needed. All questions have been answered.  ____________________________________________   FINAL CLINICAL IMPRESSION(S) / ED DIAGNOSES  Final diagnoses:  Kidney stone     ED Discharge Orders          Ordered    oxyCODONE-acetaminophen (PERCOCET) 5-325 MG tablet  Every 6 hours PRN        07/16/21 0049    ondansetron (ZOFRAN ODT) 4 MG disintegrating tablet  Every 6 hours PRN        07/16/21 0049    tamsulosin (FLOMAX) 0.4 MG CAPS capsule  Daily        07/16/21 0049            *Please note:  George Doyle was evaluated in Emergency Department on 07/16/2021 for the symptoms described in the history of present illness. He was evaluated in the context of the global COVID-19 pandemic, which  necessitated consideration that the patient might be at risk for infection with the SARS-CoV-2 virus that causes COVID-19. Institutional protocols and algorithms that pertain to the evaluation of patients at risk for COVID-19 are in a state of rapid change based on information released by regulatory bodies including the CDC and federal and state organizations. These policies and algorithms were followed during the patient's care in the ED.  Some ED evaluations and interventions may be delayed as a result of limited staffing during and the pandemic.*   Note:  This document was prepared using Dragon voice recognition software and may include unintentional dictation errors.    Chen Holzman, Delice Bison, DO 07/16/21 [phone]

## 2021-07-16 NOTE — ED Notes (Signed)
Strainer and speci-cup given to pt.  Pt tolerated ice chips.

## 2021-07-17 LAB — URINE CULTURE: Culture: <10000 — AB

## 2021-07-18 ENCOUNTER — Ambulatory Visit: Payer: Self-pay | Admitting: *Deleted

## 2021-07-18 NOTE — Telephone Encounter (Signed)
Wife Keyshawn Hellwig calling in concerned about her husband.    He had a kidney stone that he has passed we think. Last night it looks like blood clots in his urine. They sent him home with medicine 3 of them.  Last yesterday he developed coughing, tightness in chest and wheezing.   He coughed up thick mucus with a tinge of blood in it. This morning I reading the paperwork about his medicines they gave him.   He is nauseas.   Has not eaten much.  He is no longer vomiting since Sat.   I'm concerned he is having a reaction to the medications.  We stopped all those medications this morning.    All the pain from the kidney stone is resolved.   I want him seen today by Dr. Ronnald Ramp. Reason for Disposition  Patient sounds very sick or weak to the triager    Wife not wanting to return to the ED due to a bad experience they had Friday night with the kidney stone.   She is wanting him seen today by Dr. Otilio Miu.  Answer Assessment - Initial Assessment Questions 1. SYMPTOM: "Are you having difficulty swallowing liquids, solids, or both?"     He was seen in the ED Friday for a kidney stone.   Wife Shantanu Strauch is calling in for her husband.   We think he has passed the kidney stone because he is not having any more pain from that.   He is still having small blood clots in his urine.   They told us the kidney stone was very small. They gave him 3 medications that I think he is having a reaction to.   We do not want to go back to the ED as we had a bad experience there.  I prefer he be seen this afternoon by Dr. Ronnald Ramp. They gave him oxycodone 5 mg, Tamsulosin 0.4 mg, and Zofran 4 mg. 2. ONSET: "When did the swallowing problems begin?"      He is now having weakness, swollen throat, difficulty swallowing though he says that is better this morning, trouble moving from one chair to another, wheezing and tightness in his chest.  He hasn't eaten anything since Friday until this morning he ate some warm oatmeal  which he said felt good on his throat.   "I think he may be dehydrated also even though I'm trying to get him to drink Pedialyte he is not drinking much".   He is very nauseas.   He hasn't vomited since Sat. 3. CAUSE: "What do you think is causing the problem?"      I think he is having a reaction to these medications he was given in the ED.   He wasn't like this before starting them. 4. CHRONIC/RECURRENT: "Is this a new problem for you?"  If no, ask: "How long have you had this problem?" (e.g., days, weeks, months)      Yes  These are new medications for him. 5. OTHER SYMPTOMS: "Do you have any other symptoms?" (e.g., difficulty breathing, sore throat, swollen tongue, chest pain)     See above 6. PREGNANCY: "Is there any chance you are pregnant?" "When was your last menstrual period?"     N/A  Protocols used: Swallowing Difficulty-A-AH

## 2021-07-18 NOTE — Telephone Encounter (Signed)
Wife, Asaiah Scarber called in for her husband.   He was seen in the ED Friday evening for a kidney stone.   He was given 3 medications that the wife believes he may be having a reaction to.  She does not want to return to the ED due to a bad experience they had there and is requesting her husband be seen by Dr. Otilio Miu today.    The office is closed for lunch however I let her know this and that I would send a high priority note to the practice asking them to call her back to see if Dr. Ronnald Ramp can see him today.   I did go ahead and make an appt with Dr Ronnald Ramp for 07/19/2021 at 9:00.   Arbie Cookey was agreeable to this plan.   She can be reached at [phone].  I sent my notes to [facility] high [facility].

## 2021-07-19 ENCOUNTER — Telehealth: Payer: Self-pay

## 2021-07-19 ENCOUNTER — Ambulatory Visit: Payer: Medicare HMO | Admitting: Family Medicine

## 2021-07-19 NOTE — Telephone Encounter (Signed)
Copied from [place] [id number]. Topic: Appointment Scheduling - Scheduling Inquiry for Clinic >> Jul 19, 2021  8:06 AM George Doyle wrote: Patient was admited to [facility] for pneumonia therefore spouse cancelled today appointment with PCP

## 2021-07-20 ENCOUNTER — Telehealth: Payer: Self-pay | Admitting: Cardiovascular Disease

## 2021-07-20 NOTE — Telephone Encounter (Signed)
Dr. Rockey Doyle Pt has a history of CTO of RCA with collaterals and Cx disease. We are asked to hold plavix for Ureteroscopic Stone Extraction. Please give recommendations for plavix hold.

## 2021-07-20 NOTE — Telephone Encounter (Signed)
   [facility] Pre-operative Risk Assessment    Patient Name: George Doyle  DOB: [birth year] MRN: [id number]  HEARTCARE STAFF:  - IMPORTANT!!!!!! Under Visit Info/Reason for Call, type in Other and utilize the format Clearance MM/DD/YY or Clearance TBD. Do not use dashes or single digits. - Please review there is not already an duplicate clearance open for this procedure. - If request is for dental extraction, please clarify the # of teeth to be extracted. - If the patient is currently at the dentist's office, call Pre-Op Callback Staff (MA/nurse) to input urgent request.  - If the patient is not currently in the dentist office, please route to the Pre-Op pool.  Request for surgical clearance:  What type of surgery is being performed? Ureteroscopic Stone Extraction   When is this surgery scheduled? TBD  What type of clearance is required (medical clearance vs. Pharmacy clearance to hold med vs. Both)? both  Are there any medications that need to be held prior to surgery and how long? Stop Plavix 5 days prior to surgery   Practice name and name of physician performing surgery? [facility] - Dr Bishop Limbo  What is the office phone number? [phone]   7.   What is the office fax number? [phone]  8.   Anesthesia type (None, local, MAC, general) ? Not listed    Ace Gins 07/20/2021, 2:03 PM  _________________________________________________________________   (provider comments below)

## 2021-07-24 NOTE — Telephone Encounter (Signed)
    Patient Name: George Doyle  DOB: [birth year] MRN: [id number]  Primary Cardiologist: Ida Rogue, MD  Chart reviewed as part of pre-operative protocol coverage. Per Dr. Rockey Situ, [name] would be at moderate but acceptable risk for the planned procedure without further cardiovascular testing.   Per Dr. Rockey Situ, patient can hold plavix 5 days prior to his upcoming urologic procedure with plans to restart as soon as he is cleared to do so by his urologist. Recommend continuing aspirin throughout the periprocedural period.   I will route this recommendation to the requesting party via Epic fax function and remove from pre-op pool.  Please call with questions.  Abigail Butts, PA-C 07/24/2021, 10:47 PM

## 2021-07-25 ENCOUNTER — Telehealth: Payer: Self-pay | Admitting: Cardiovascular Disease

## 2021-07-25 ENCOUNTER — Telehealth: Payer: Self-pay

## 2021-07-25 NOTE — Telephone Encounter (Signed)
DPR ok to s/w pt's wife. Pt's wife has been notified pt is cleared for his procedure. I d/w pt's wife the pt will need to hold Plavix x 5 days prior to procedure and resume once felt safe by the surgeon. Pt's wife aware pt to remain on ASA though throughout procedure. Pt's wife verbalized understanding to all instructions today and thanked me for the call. I will fax updated notes to requesting office as well.

## 2021-07-25 NOTE — Telephone Encounter (Signed)
Patient's wife is calling to check the status of his clearance.

## 2021-07-25 NOTE — Telephone Encounter (Signed)
Pt c/o medication issue:  1. Name of Medication: carvedilol   2. How are you currently taking this medication (dosage and times per day)? 12.5 mg po BID   3. Are you having a reaction (difficulty breathing--STAT)? No   4. What is your medication issue? Patient documentation all have him taking 6.25 BID  Wife wants to clarify dose

## 2021-07-25 NOTE — Telephone Encounter (Signed)
Was able to reach back out to pt's wife Arbie Cookey (DPR approved), wife is confused with dosing of pt's carvedilol. Pt has been taking carvedilol 6.25 mg BID, at last refill this month it was increased to 12.5 mg without notification.  [facility] last OV on 04/18/2021, refill sent in for carvedilol 3.25 mg BID from Dr. Donivan Scull office same as Dr. Donivan Scull last [place] notes that correlates with the 6.25 mg dosing. Unable to find office notes in EMR regarding change, was able to see where the carvedilol was increased to 12.5 mg on 05/09/2021  carvedilol (COREG) 12.5 MG tablet [[id number]]    Order Details Dose: 12.5 mg Route: Oral Frequency: 2 times daily  Dispense Quantity: -- Refills: --        Sig: Take 12.5 mg by mouth 2 (two) times daily.       Start Date: 05/09/21 End Date: --  Written Date: -- Expiration Date: --  Ordering Date: 07/25/21    Source:  Received from: External Pharmacy  Providers  Authorizing Provider:   [provider]  Phone:  --   Fax:  [phone]  DEA #:  --   NPI:  [id number]      Documenting User:  [name], LPN    Advised unsure who [name], LPN is within out system, may be sort of care coordinator, but no documentation as to why change in medication and sent to pharmacy. Unable to determine what practice she may be with within the [facility]. Suggest calling pharmacy to see if they can see where the electronic script came from.   OV notes from 11/12/20 with Blanche East, PA-C Medication changes: Stop Lopressor 50 mg twice daily.  Start carvedilol 6.25 mg twice daily and escalate RTC if further room in heart rate and blood pressure.  Suggest if can find who increase medication may be able to find justification for why the increase, if not, advised to call back for dose clarification with Dr. Rockey Situ, in meantime monitor BP/HR.   Mrs. Roberston very frustrated and upset regarding medication but grateful for the return call and information, will  call back with any further information or changes.

## 2021-07-25 NOTE — Telephone Encounter (Signed)
Transition Care Management Follow-up Telephone Call Date of discharge and from where: 07/21/21 How have you been since you were released from the hospital? Pt's wife Arbie Cookey states he is getting better, still has a kidney stone and waiting on urologist for procedure date to laser the stone.  Any questions or concerns? Yes - pt's wife states he has had on average 6 episodes of diarrhea per day since Thursday. She has been giving him pedialyte to help prevent dehydration and he is eating and drinking well. He was given miralax and senna during admission and was also on antibiotics.   Pt has also been taking carvedilol 12.5 mg BID since last dispensed by [facility] on 05/09/21. Per wife he is to be taking 6.25 mg BID and was unaware of any change from Dr. Rockey Situ; office note does not indicate any change. Pt to contact cardiology to advise.   Items Reviewed: Did the pt receive and understand the discharge instructions provided? Yes  Medications obtained and verified? Yes  Other? No  Any new allergies since your discharge? No  Dietary orders reviewed? Yes Do you have support at home? Yes   Home Care and Equipment/Supplies: Were home health services ordered? yes If so, what is the name of the agency? [facility]   Has the agency set up a time to come to the patient's home? no Were any new equipment or medical supplies ordered?  No - a walker was suggested; pt already had one at home  Functional Questionnaire: (I = Independent and D = Dependent) ADLs: I  Bathing/Dressing- I  Meal Prep- I  Eating- I  Maintaining continence- I  Transferring/Ambulation- I  Managing Meds- D  Follow up appointments reviewed:  PCP Hospital f/u appt confirmed? Yes  Scheduled to see Dr. Ronnald Ramp on 08/01/21 @ 1:40. [facility] f/u appt confirmed? No  awaiting appt with urology for kidney stone Are transportation arrangements needed? No  If their condition worsens, is the pt aware to call PCP or go  to the Emergency Dept.? Yes Was the patient provided with contact information for the PCP's office or ED? Yes Was to pt encouraged to call back with questions or concerns? Yes

## 2021-07-27 NOTE — Telephone Encounter (Signed)
[facility] is calling back to get status of clearance. States they did not receive fax clearance. FAX [phone]

## 2021-07-27 NOTE — Telephone Encounter (Signed)
Clearance has been re-faxed x 2 to fax # given today [phone]

## 2021-08-01 ENCOUNTER — Encounter: Payer: Self-pay | Admitting: Family Medicine

## 2021-08-01 ENCOUNTER — Ambulatory Visit
Admission: RE | Admit: 2021-08-01 | Discharge: 2021-08-01 | Disposition: A | Discharge status: Home / Self Care | Payer: Medicare HMO | Source: Ambulatory Visit | Attending: Family Medicine | Admitting: Family Medicine

## 2021-08-01 ENCOUNTER — Ambulatory Visit
Admission: RE | Admit: 2021-08-01 | Discharge: 2021-08-01 | Disposition: A | Discharge status: Home / Self Care | Payer: Medicare HMO | Attending: Family Medicine | Admitting: Family Medicine

## 2021-08-01 ENCOUNTER — Other Ambulatory Visit: Payer: Self-pay

## 2021-08-01 ENCOUNTER — Ambulatory Visit (INDEPENDENT_AMBULATORY_CARE_PROVIDER_SITE_OTHER): Payer: Medicare HMO | Admitting: Family Medicine

## 2021-08-01 VITALS — BP 120/80 | HR 56 | Resp 16 | Ht 69.0 in | Wt 202.6 lb

## 2021-08-01 DIAGNOSIS — Z862 Personal history of diseases of the blood and blood-forming organs and certain disorders involving the immune mechanism: Secondary | ICD-10-CM

## 2021-08-01 DIAGNOSIS — Z09 Encounter for follow-up examination after completed treatment for conditions other than malignant neoplasm: Secondary | ICD-10-CM | POA: Diagnosis not present

## 2021-08-01 DIAGNOSIS — I10 Essential (primary) hypertension: Secondary | ICD-10-CM

## 2021-08-01 DIAGNOSIS — Z8719 Personal history of other diseases of the digestive system: Secondary | ICD-10-CM

## 2021-08-01 DIAGNOSIS — Z8679 Personal history of other diseases of the circulatory system: Secondary | ICD-10-CM

## 2021-08-01 DIAGNOSIS — N200 Calculus of kidney: Secondary | ICD-10-CM

## 2021-08-01 DIAGNOSIS — R7989 Other specified abnormal findings of blood chemistry: Secondary | ICD-10-CM

## 2021-08-01 DIAGNOSIS — R9389 Abnormal findings on diagnostic imaging of other specified body structures: Secondary | ICD-10-CM | POA: Insufficient documentation

## 2021-08-01 MED ORDER — EPINEPHRINE 0.3 MG/0.3ML IJ SOAJ: 0.3000 mg | INTRAMUSCULAR | 1 refills | Status: AC | PRN

## 2021-08-01 NOTE — Progress Notes (Signed)
Date:  08/01/2021   Name:  George Doyle   DOB:  12-Feb-1951   MRN:  [id number]   Chief Complaint: Hospitalization Follow-up  Patient is a 70year old male who presents for a follow-up hospital discharge exam. The patient reports the following problems: No present concerns. Health maintenance has been reviewed up to date.  Upon discharge from [facility] patient was told to resume medication and has been on losartan and spironolactone for 2 weeks thereafter.  He has not experienced any further swelling of the throat and there is been no lip swelling or generalized tongue swelling and therefore we will maintain since he is taking this regimen of medicine not only for hypertension but also for possible congestive heart failure per cardiology.     Hypertension This is a chronic problem. The current episode started more than 1 year ago. The problem has been gradually improving since onset. The problem is controlled. Past treatments include alpha 1 blockers, beta blockers, diuretics and angiotensin blockers. The current treatment provides moderate improvement. There are no compliance problems.  Hypertensive end-organ damage includes heart failure.   Lab Results  Component Value Date   CREATININE 1.40 (H) 07/15/2021   BUN 25 (H) 07/15/2021   NA 130 (L) 07/15/2021   K 4.3 07/15/2021   CL 95 (L) 07/15/2021   CO2 23 07/15/2021   Lab Results  Component Value Date   CHOL 111 12/17/2020   HDL 51 12/17/2020   LDLCALC 44 12/17/2020   TRIG 76 12/17/2020   CHOLHDL 2.1 06/11/2018   No results found for: TSH No results found for: HGBA1C Lab Results  Component Value Date   WBC 16.3 (H) 07/15/2021   HGB 13.9 07/15/2021   HCT 40.3 07/15/2021   MCV 90.4 07/15/2021   PLT 245 07/15/2021   Lab Results  Component Value Date   ALT 17 07/15/2021   AST 24 07/15/2021   ALKPHOS 69 07/15/2021   BILITOT 1.3 (H) 07/15/2021     Review of Systems  Patient Active Problem List    Diagnosis Date Noted   Benign neoplasm of skin of left forearm 01/27/2019   Pure hypercholesterolemia 06/21/2018   Poorly-controlled hypertension 06/21/2018   Seizure disorder ([place]) 06/21/2018   Hill-Sachs lesion of right shoulder 06/19/2018   Injury of tendon of long head of right biceps 06/19/2018   Rotator cuff tendinitis, right 06/19/2018   Traumatic complete tear of right rotator cuff 06/19/2018   Headache disorder 05/09/2018   CAD (coronary artery disease), native coronary artery 03/28/2018   History of seizures 03/28/2018   Subdural hematoma 03/27/2018   Vasovagal syncope 03/27/2018   Old MI (myocardial infarction) 08/30/2017   CAD, multiple vessel 08/01/2017   NSTEMI (non-ST elevated myocardial infarction) ([place]) 07/20/2017   Hyperlipidemia 05/10/2017   Preop cardiovascular exam    Benign neoplasm of ascending colon    Polyp of sigmoid colon    Essential hypertension 03/03/2016    Allergies  Allergen Reactions   Losartan Other (See Comments)    Angioedema    Past Surgical History:  Procedure Laterality Date   BICEPT TENODESIS Right 07/18/2018   Procedure: BICEPS TENODESIS;  Surgeon: Corky Mull, MD;  Location: [facility];  Service: Orthopedics;  Laterality: Right;   COLONOSCOPY  2011   cleared for 10 yrs   COLONOSCOPY WITH PROPOFOL N/A 11/06/2016   Procedure: COLONOSCOPY WITH PROPOFOL;  Surgeon: [name], MD;  Location: [facility];  Service: Endoscopy;  Laterality: N/A;  CORONARY STENT INTERVENTION N/A 07/23/2017   Procedure: CORONARY STENT INTERVENTION;  Surgeon: Yolonda Kida, MD;  Location: [facility];  Service: Cardiovascular;  Laterality: N/A;   CYST EXCISION     Forearm   HERNIA REPAIR     KNEE SURGERY Bilateral    2 on R) 1 on L)   LEFT HEART CATH AND CORONARY ANGIOGRAPHY N/A 07/23/2017   Procedure: LEFT HEART CATH AND CORONARY ANGIOGRAPHY;  Surgeon: Minna Merritts, MD;  Location: [facility];  Service: Cardiovascular;   Laterality: N/A;   POLYPECTOMY  11/06/2016   Procedure: POLYPECTOMY;  Surgeon: [name], MD;  Location: [place];  Service: Endoscopy;;   SHOULDER ARTHROSCOPY WITH OPEN ROTATOR CUFF REPAIR Right 07/18/2018   Procedure: SHOULDER ARTHROSCOPY WITH OPEN ROTATOR CUFF REPAIR;  Surgeon: Corky Mull, MD;  Location: [facility];  Service: Orthopedics;  Laterality: Right;    Social History   Tobacco Use   Smoking status: Former   Smokeless tobacco: Never   Tobacco comments:    quit over 20 yrs ago  Vaping Use   Vaping Use: Never used  Substance Use Topics   Alcohol use: Yes    Alcohol/week: 0.0 standard drinks    Comment: 1-2 drinks/year   Drug use: No     Medication list has been reviewed and updated.  Current Meds  Medication Sig   acetaminophen (TYLENOL) 500 MG tablet Take 500 mg by mouth at bedtime. As needed/ otc   aspirin EC 81 MG EC tablet Take 1 tablet (81 mg total) daily by mouth.   atorvastatin (LIPITOR) 80 MG tablet TAKE 1 TABLET(80 MG) BY MOUTH DAILY AT 6 PM   carvedilol (COREG) 12.5 MG tablet Take 12.5 mg by mouth 2 (two) times daily.   citalopram (CELEXA) 20 MG tablet Take 20 mg by mouth daily.   clopidogrel (PLAVIX) 75 MG tablet TAKE 1 TABLET(75 MG) BY MOUTH DAILY   EPINEPHrine 0.3 mg/0.3 mL IJ SOAJ injection Inject 0.3 mg into the muscle as needed for anaphylaxis.   fexofenadine (ALLEGRA) 180 MG tablet Take 180 mg by mouth as needed for allergies or rhinitis. otc   lamoTRIgine (LAMICTAL) 200 MG tablet Take 200 mg by mouth daily.   losartan (COZAAR) 100 MG tablet Take 1 tablet (100 mg total) by mouth daily.   nitroGLYCERIN (NITROSTAT) 0.4 MG SL tablet PLACE 1 TABLET UNDER THE TONGUE EVERY 5 MINUTES FOR 3 DOSES AS NEEDED FOR CHEST PAIN   spironolactone (ALDACTONE) 25 MG tablet Take 1 tablet (25 mg total) by mouth daily.   tamsulosin (FLOMAX) 0.4 MG CAPS capsule Take 1 capsule (0.4 mg total) by mouth daily.    PHQ 2/9 Scores 08/01/2021 04/06/2021 12/17/2020  11/10/2020  PHQ - 2 Score 0 0 0 0  PHQ- 9 Score 0 0 0 -    GAD 7 : Generalized Anxiety Score 08/01/2021 04/06/2021 12/17/2020 11/05/2020  Nervous, Anxious, on Edge 1 0 0 0  Control/stop worrying 0 0 0 0  Worry too much - different things 0 0 0 0  Trouble relaxing 0 0 0 0  Restless 0 0 0 0  Easily annoyed or irritable 0 0 1 0  Afraid - awful might happen 0 0 0 0  Total GAD 7 Score 1 0 1 0  Anxiety Difficulty - - Not difficult at all -    BP Readings from Last 3 Encounters:  08/01/21 120/80  07/16/21 136/81  04/18/21 130/70    Physical Exam  Wt Readings from Last 3 Encounters:  08/01/21 202 lb 9.6 oz (91.9 kg)  07/15/21 205 lb (93 kg)  04/18/21 207 lb 6 oz (94.1 kg)    BP 120/80   Pulse (!) 56   Resp 16   Ht 5\' 9"  (1.753 m)   Wt 202 lb 9.6 oz (91.9 kg)   BMI 29.92 kg/m   Assessment and Plan:  1. Hospital discharge follow-up Presentation to clinic 2 weeks following discharge from hospital for glossitis and pneumonitis and nephrolithiasis with obstruction of ureter.  Patient is doing well and has an upcoming appointment at [facility] but as noted below family would like to remain in the area and we are referring to urology for further evaluation if they will accept.  2. History of glossitis Given the symptoms and the CT results of the neck there was soft tissue swelling of 1 side of the tongue which is consistent with glossitis and I am not certain if it can be interpreted as being true angioedema.  At this point in time patient has been back on his losartan and spironolactone as well as carvedilol and is tolerating well.  There is been no difficulty swallowing since resumption and we will continue at this time.  Patient has been given an EpiPen to have in case there is abrupt swelling in symptoms suggesting of indurated edema possibility.  Again I would like to say that there is a remote call on angioedema and that is why we have decided to continue as previous because blood  pressure is so good and patient is asymptomatic at this time. - EPINEPHrine 0.3 mg/0.3 mL IJ SOAJ injection; Inject 0.3 mg into the muscle as needed for anaphylaxis.  Dispense: 1 each; Refill: 1  3. Abnormal chest CT Patient had abnormal chest x-ray with which suggested either aspiration or pneumonitis and we are repeating chest x-ray today clearance. - DG Chest 2 View  4. History of leukocytosis Patient had mild leukocytosis which may be secondary to prednisone however we will check CBC to see if this has returned to normal. - CBC with Differential  5. Elevated serum creatinine Patient had AKI that was noted with elevated creatinine and we will repeat renal function to see if this is returned to normal. - Renal Function Panel  6. Primary hypertension Chronic.  Controlled.  Stable.  Blood pressure is 120/80.  Patient will continue regimen of carvedilol, losartan, spironolactone.  7. History of chronic CHF Chronic.  Controlled.  Stable.  Followed by cardiology/Dr. Rockey Situ and patient has upcoming appointment next year and we will continue with current medical regimen as previous prescribed.  8. Nephrolithiasis As noted patient has bilateral nephrolithiasis with left hydroureteronephrosis noted with stranding secondary to a 4 mm calculus in the distal left ureter.  Patient is currently not symptomatic but will need further evaluation by urology and if necessary intervention.  Family would prefer to have closer to home as far as evaluation of this and we have placed a referral to local urologic specialty for evaluation and treatment thereupon. - Ambulatory referral to Urology

## 2021-08-02 LAB — CBC WITH DIFFERENTIAL/PLATELET
Basophils Absolute: 0.1 10*3/uL (ref 0.0–0.2)
Basos: 1 %
EOS (ABSOLUTE): 0.1 10*3/uL (ref 0.0–0.4)
Eos: 1 %
Hematocrit: 39.4 % (ref 37.5–51.0)
Hemoglobin: 13.3 g/dL (ref 13.0–17.7)
Immature Grans (Abs): 0 10*3/uL (ref 0.0–0.1)
Immature Granulocytes: 1 %
Lymphocytes Absolute: 1.4 10*3/uL (ref 0.7–3.1)
Lymphs: 17 %
MCH: 30.5 pg (ref 26.6–33.0)
MCHC: 33.8 g/dL (ref 31.5–35.7)
MCV: 90 fL (ref 79–97)
Monocytes Absolute: 1 10*3/uL — ABNORMAL HIGH (ref 0.1–0.9)
Monocytes: 12 %
Neutrophils Absolute: 5.7 10*3/uL (ref 1.4–7.0)
Neutrophils: 68 %
Platelets: 292 10*3/uL (ref 150–450)
RBC: 4.36 x10E6/uL (ref 4.14–5.80)
RDW: 12.5 % (ref 11.6–15.4)
WBC: 8.3 10*3/uL (ref 3.4–10.8)

## 2021-08-02 LAB — RENAL FUNCTION PANEL
Albumin: 4.1 g/dL (ref 3.7–4.7)
BUN/Creatinine Ratio: 18 (ref 10–24)
BUN: 20 mg/dL (ref 8–27)
CO2: 25 mmol/L (ref 20–29)
Calcium: 9.5 mg/dL (ref 8.6–10.2)
Chloride: 97 mmol/L (ref 96–106)
Creatinine, Ser: 1.14 mg/dL (ref 0.76–1.27)
Glucose: 87 mg/dL (ref 70–99)
Phosphorus: 4.5 mg/dL — ABNORMAL HIGH (ref 2.8–4.1)
Potassium: 5.1 mmol/L (ref 3.5–5.2)
Sodium: 133 mmol/L — ABNORMAL LOW (ref 134–144)
eGFR: 68 mL/min/{1.73_m2} (ref >59)

## 2021-08-08 ENCOUNTER — Other Ambulatory Visit: Payer: Self-pay

## 2021-08-08 DIAGNOSIS — N200 Calculus of kidney: Secondary | ICD-10-CM

## 2021-08-09 ENCOUNTER — Other Ambulatory Visit: Payer: Self-pay | Admitting: *Deleted

## 2021-08-09 ENCOUNTER — Encounter: Payer: Self-pay | Admitting: Urology

## 2021-08-09 ENCOUNTER — Other Ambulatory Visit
Admission: RE | Admit: 2021-08-09 | Discharge: 2021-08-09 | Disposition: A | Discharge status: Home / Self Care | Payer: Medicare HMO | Source: Home / Self Care | Attending: Urology | Admitting: Urology

## 2021-08-09 ENCOUNTER — Ambulatory Visit (INDEPENDENT_AMBULATORY_CARE_PROVIDER_SITE_OTHER): Payer: Medicare HMO | Admitting: Urology

## 2021-08-09 ENCOUNTER — Other Ambulatory Visit: Payer: Self-pay

## 2021-08-09 ENCOUNTER — Ambulatory Visit
Admission: RE | Admit: 2021-08-09 | Discharge: 2021-08-09 | Disposition: A | Discharge status: Home / Self Care | Payer: Medicare HMO | Source: Ambulatory Visit | Attending: Urology | Admitting: Urology

## 2021-08-09 ENCOUNTER — Ambulatory Visit
Admission: RE | Admit: 2021-08-09 | Discharge: 2021-08-09 | Disposition: A | Discharge status: Home / Self Care | Payer: Medicare HMO | Attending: Urology | Admitting: Urology

## 2021-08-09 VITALS — BP 146/75 | HR 60 | Ht 69.0 in | Wt 203.0 lb

## 2021-08-09 DIAGNOSIS — N200 Calculus of kidney: Secondary | ICD-10-CM

## 2021-08-09 LAB — URINALYSIS, COMPLETE (UACMP) WITH MICROSCOPIC
Bacteria, UA: NONE SEEN
Bilirubin Urine: NEGATIVE
Glucose, UA: NEGATIVE mg/dL
Ketones, ur: NEGATIVE mg/dL
Leukocytes,Ua: NEGATIVE
Nitrite: NEGATIVE
Protein, ur: NEGATIVE mg/dL
Specific Gravity, Urine: 1.02 (ref 1.005–1.030)
pH: 5.5 (ref 5.0–8.0)

## 2021-08-09 NOTE — Patient Instructions (Signed)
Dietary Guidelines to Help Prevent Kidney Stones Kidney stones are deposits of minerals and salts that form inside your kidneys. Your risk of developing kidney stones may be greater depending on your diet, your lifestyle, the medicines you take, and whether you have certain medical conditions. Most people can lower their chances of developing kidney stones by following the instructions below. Your dietitian may give you more specific instructions depending on your overall health and the type of kidney stones you tend to develop. What are tips for following this plan? Reading food labels  Choose foods with "no salt added" or "low-salt" labels. Limit your salt (sodium) intake to less than 1,500 mg a day. Choose foods with calcium for each meal and snack. Try to eat about 300 mg of calcium at each meal. Foods that contain 200-500 mg of calcium a serving include: 8 oz (237 mL) of milk, calcium-fortifiednon-dairy milk, and calcium-fortifiedfruit juice. Calcium-fortified means that calcium has been added to these drinks. 8 oz (237 mL) of kefir, yogurt, and soy yogurt. 4 oz (114 g) of tofu. 1 oz (28 g) of cheese. 1 cup (150 g) of dried figs. 1 cup (91 g) of cooked broccoli. One 3 oz (85 g) can of sardines or mackerel. Most people need 1,000-1,500 mg of calcium a day. Talk to your dietitian about how much calcium is recommended for you. Shopping Buy plenty of fresh fruits and vegetables. Most people do not need to avoid fruits and vegetables, even if these foods contain nutrients that may contribute to kidney stones. When shopping for convenience foods, choose: Whole pieces of fruit. Pre-made salads with dressing on the side. Low-fat fruit and yogurt smoothies. Avoid buying frozen meals or prepared deli foods. These can be high in sodium. Look for foods with live cultures, such as yogurt and kefir. Choose high-fiber grains, such as whole-wheat breads, oat bran, and wheat cereals. Cooking Do not add  salt to food when cooking. Place a salt shaker on the table and allow each person to add his or her own salt to taste. Use vegetable protein, such as beans, textured vegetable protein (TVP), or tofu, instead of meat in pasta, casseroles, and soups. Meal planning Eat less salt, if told by your dietitian. To do this: Avoid eating processed or pre-made food. Avoid eating fast food. Eat less animal protein, including cheese, meat, poultry, or fish, if told by your dietitian. To do this: Limit the number of times you have meat, poultry, fish, or cheese each week. Eat a diet free of meat at least 2 days a week. Eat only one serving each day of meat, poultry, fish, or seafood. When you prepare animal protein, cut pieces into small portion sizes. For most meat and fish, one serving is about the size of the palm of your hand. Eat at least five servings of fresh fruits and vegetables each day. To do this: Keep fruits and vegetables on hand for snacks. Eat one piece of fruit or a handful of berries with breakfast. Have a salad and fruit at lunch. Have two kinds of vegetables at dinner. Limit foods that are high in a substance called oxalate. These include: Spinach (cooked), rhubarb, beets, sweet potatoes, and Swiss chard. Peanuts. Potato chips, french fries, and baked potatoes with skin on. Nuts and nut products. Chocolate. If you regularly take a diuretic medicine, make sure to eat at least 1 or 2 servings of fruits or vegetables that are high in potassium each day. These include: Avocado. Banana. Orange, prune,   carrot, or tomato juice. Baked potato. Cabbage. Beans and split peas. Lifestyle  Drink enough fluid to keep your urine pale yellow. This is the most important thing you can do. Spread your fluid intake throughout the day. If you drink alcohol: Limit how much you use to: 0-1 drink a day for women who are not pregnant. 0-2 drinks a day for men. Be aware of how much alcohol is in your  drink. In the [place] one drink equals one 12 oz bottle of beer (355 mL), one 5 oz glass of wine (148 mL), or one 1 oz glass of hard liquor (44 mL). Lose weight if told by your health care provider. Work with your dietitian to find an eating plan and weight loss strategies that work best for you. General information Talk to your health care provider and dietitian about taking daily supplements. You may be told the following depending on your health and the cause of your kidney stones: Not to take supplements with vitamin C. To take a calcium supplement. To take a daily probiotic supplement. To take other supplements such as magnesium, fish oil, or vitamin B6. Take over-the-counter and prescription medicines only as told by your health care provider. These include supplements. What foods should I limit? Limit your intake of the following foods, or eat them as told by your dietitian. Vegetables Spinach. Rhubarb. Beets. Canned vegetables. Pickles. Olives. Baked potatoes with skin. Grains Wheat bran. Baked goods. Salted crackers. Cereals high in sugar. Meats and other proteins Nuts. Nut butters. Large portions of meat, poultry, or fish. Salted, precooked, or cured meats, such as sausages, meat loaves, and hot dogs. Dairy Cheese. Beverages Regular soft drinks. Regular vegetable juice. Seasonings and condiments Seasoning blends with salt. Salad dressings. Soy sauce. Ketchup. Barbecue sauce. Other foods Canned soups. Canned pasta sauce. Casseroles. Pizza. Lasagna. Frozen meals. Potato chips. French fries. The items listed above may not be a complete list of foods and beverages you should limit. Contact a dietitian for more information. What foods should I avoid? Talk to your dietitian about specific foods you should avoid based on the type of kidney stones you have and your overall health. Fruits Grapefruit. The item listed above may not be a complete list of foods and beverages you should  avoid. Contact a dietitian for more information. Summary Kidney stones are deposits of minerals and salts that form inside your kidneys. You can lower your risk of kidney stones by making changes to your diet. The most important thing you can do is drink enough fluid. Drink enough fluid to keep your urine pale yellow. Talk to your dietitian about how much calcium you should have each day, and eat less salt and animal protein as told by your dietitian. This information is not intended to replace advice given to you by your health care provider. Make sure you discuss any questions you have with your health care provider. Document Revised: 08/28/2019 Document Reviewed: 08/28/2019 Elsevier Patient Education  2022 [employer]  

## 2021-08-09 NOTE — Progress Notes (Signed)
08/09/21 12:31 PM   [name] 31-Dec-1950 [id number]  CC: Left distal ureteral stone  HPI: 70 year old male here with his wife today for evaluation of a 3 to 4 mm left distal ureteral stone.  This was originally diagnosed when he presented to the [facility] ED with flank pain on 07/15/2021, and CT showed a 3 mm left distal ureteral stone with mild hydronephrosis, and small nonobstructive right renal stones.  He then re-presented to the [facility] ED with similar symptoms as well as some difficulty swallowing on 07/19/2021 and repeat CT showed a stable 3 mm left distal ureteral stone with mild hydronephrosis.    He reports he has not had any pain over the last 2-3 weeks.  Urinalysis today is completely benign with no microscopic hematuria.  KUB today shows no definite evidence of left distal ureteral stone   PMH: Past Medical History:  Diagnosis Date   Allergy    Arthritis    knees, hands   Hypertension    Myocardial infarction ([place]) 07/2017   Pneumonia    Seizures ([place])    approx 1970's, 2005, June 2019  Testing showed no cause. Extended EEG scheduled for November 2019    Surgical History: Past Surgical History:  Procedure Laterality Date   BICEPT TENODESIS Right 07/18/2018   Procedure: BICEPS TENODESIS;  Surgeon: Corky Mull, MD;  Location: [facility] ORS;  Service: Orthopedics;  Laterality: Right;   COLONOSCOPY  2011   cleared for 10 yrs   COLONOSCOPY WITH PROPOFOL N/A 11/06/2016   Procedure: COLONOSCOPY WITH PROPOFOL;  Surgeon: [name], MD;  Location: [place];  Service: Endoscopy;  Laterality: N/A;   CORONARY STENT INTERVENTION N/A 07/23/2017   Procedure: CORONARY STENT INTERVENTION;  Surgeon: Yolonda Kida, MD;  Location: [place] CV [facility];  Service: Cardiovascular;  Laterality: N/A;   CYST EXCISION     Forearm   HERNIA REPAIR     KNEE SURGERY Bilateral    2 on R) 1 on L)   LEFT HEART CATH AND CORONARY ANGIOGRAPHY N/A 07/23/2017   Procedure: LEFT  HEART CATH AND CORONARY ANGIOGRAPHY;  Surgeon: Minna Merritts, MD;  Location: [place] CV LAB;  Service: Cardiovascular;  Laterality: N/A;   POLYPECTOMY  11/06/2016   Procedure: POLYPECTOMY;  Surgeon: [name], MD;  Location: [place];  Service: Endoscopy;;   SHOULDER ARTHROSCOPY WITH OPEN ROTATOR CUFF REPAIR Right 07/18/2018   Procedure: SHOULDER ARTHROSCOPY WITH OPEN ROTATOR CUFF REPAIR;  Surgeon: Corky Mull, MD;  Location: [facility] OR[facility];  Service: Orthopedics;  Laterality: Right;    Family History: Family History  Problem Relation Age of Onset   CAD Father    Lung cancer Father    Heart failure Mother     Social History:  reports that he has quit smoking. He has never used smokeless tobacco. He reports current alcohol use. He reports that he does not use drugs.  Physical Exam: BP (!) 146/75   Pulse 60   Ht 5\' 9"  (1.753 m)   Wt 203 lb (92.1 kg)   BMI 29.98 kg/m    Constitutional:  Alert and oriented, No acute distress. Cardiovascular: No clubbing, cyanosis, or edema. Respiratory: Normal respiratory effort, no increased work of breathing. GI: Abdomen is soft, nontender, nondistended, no abdominal masses   Laboratory Data: Reviewed  Pertinent Imaging: I have personally viewed and interpreted the CT dated 07/15/2021, 11/1, and the KUB today.  Assessment & Plan:   70 year old male with likely spontaneous passage  of a small 3 mm left distal ureteral stone, as his pain is resolved over the last 2 to 3 weeks, urinalysis today is benign, and no stone clearly seen on KUB today.  We discussed general stone prevention strategies including adequate hydration with goal of producing 2.5 L of urine daily, increasing citric acid intake, increasing calcium intake during high oxalate meals, minimizing animal protein, and decreasing salt intake. Information about dietary recommendations given today.   RTC 1 year KUB for surveillance of right renal stones   Nickolas Madrid, MD 08/09/2021  [facility] [address] [place], [place] [address] [phone]

## 2021-08-19 ENCOUNTER — Telehealth: Payer: Self-pay | Admitting: Family Medicine

## 2021-08-19 NOTE — Telephone Encounter (Signed)
Hillary Bow calling with [facility] PT is calling to report the patient will have a missed visit today. Pt wanted to hold off until next week [phone]

## 2021-10-07 ENCOUNTER — Ambulatory Visit: Payer: Medicare HMO | Admitting: Family Medicine

## 2021-11-14 ENCOUNTER — Ambulatory Visit (INDEPENDENT_AMBULATORY_CARE_PROVIDER_SITE_OTHER): Payer: Medicare HMO

## 2021-11-14 DIAGNOSIS — Z0000 Encounter for general adult medical examination without abnormal findings: Secondary | ICD-10-CM | POA: Diagnosis not present

## 2021-11-14 DIAGNOSIS — Z1211 Encounter for screening for malignant neoplasm of colon: Secondary | ICD-10-CM

## 2021-11-14 NOTE — Progress Notes (Signed)
Subjective:   George Doyle is a 71 y.o. male who presents for Medicare Annual/Subsequent preventive examination.  Virtual Visit via Telephone Note  I connected with  [name] on 11/14/21 at  2:00 PM EST by telephone and verified that I am speaking with the correct person using two identifiers.  Location: Patient: home Provider: [facility] Persons participating in the virtual visit: [place]   I discussed the limitations, risks, security and privacy concerns of performing an evaluation and management service by telephone and the availability of in person appointments. The patient expressed understanding and agreed to proceed.  Interactive audio and video telecommunications were attempted between this nurse and patient, however failed, due to patient having technical difficulties OR patient did not have access to video capability.  We continued and completed visit with audio only.  Some vital signs may be absent or patient reported.   [name], LPN   Review of Systems     Cardiac Risk Factors include: advanced age (>45men, >73 women);dyslipidemia;male gender;hypertension;obesity (BMI >30kg/m2)     Objective:    There were no vitals filed for this visit. There is no height or weight on file to calculate BMI.  Advanced Directives 11/14/2021 11/10/2020 11/10/2019 07/18/2018 07/15/2018 04/02/2018 09/24/2017  Does Patient Have a Medical Advance Directive? Yes Yes Yes Yes Yes Yes Yes  Type of Paramedic of [place];Living will [place];Living will [place];Living will [place];Living will [place];Living will [place];Living will [place];Living will  Does patient want to make changes to medical advance directive? - - - No - Patient declined No - Patient declined - No - Patient declined  Copy of [place] in Chart? Yes - validated most recent copy scanned in chart (See row information) Yes - validated most recent copy scanned in chart (See row information) Yes - validated most recent copy scanned in chart (See row information) Yes Yes - Yes  Would patient like information on creating a medical advance directive? - - - - - - -  Some encounter information is confidential and restricted. Go to Review Flowsheets activity to see all data.    Current Medications (verified) Outpatient Encounter Medications as of 11/14/2021  Medication Sig   acetaminophen (TYLENOL) 500 MG tablet Take 500 mg by mouth at bedtime. As needed/ otc   aspirin EC 81 MG EC tablet Take 1 tablet (81 mg total) daily by mouth.   atorvastatin (LIPITOR) 80 MG tablet TAKE 1 TABLET(80 MG) BY MOUTH DAILY AT 6 PM   carvedilol (COREG) 12.5 MG tablet Take 12.5 mg by mouth 2 (two) times daily.   citalopram (CELEXA) 20 MG tablet Take 20 mg by mouth daily.   clopidogrel (PLAVIX) 75 MG tablet TAKE 1 TABLET(75 MG) BY MOUTH DAILY   EPINEPHrine 0.3 mg/0.3 mL IJ SOAJ injection Inject 0.3 mg into the muscle as needed for anaphylaxis.   fexofenadine (ALLEGRA) 180 MG tablet Take 180 mg by mouth as needed for allergies or rhinitis. otc   lamoTRIgine (LAMICTAL) 200 MG tablet Take 200 mg by mouth daily.   latanoprost (XALATAN) 0.005 % ophthalmic solution Place 1 drop into both eyes at bedtime.   losartan (COZAAR) 100 MG tablet Take 1 tablet (100 mg total) by mouth daily.   nitroGLYCERIN (NITROSTAT) 0.4 MG SL tablet PLACE 1 TABLET UNDER THE TONGUE EVERY 5 MINUTES FOR 3 DOSES AS NEEDED FOR CHEST  PAIN   spironolactone (ALDACTONE) 25 MG tablet Take 1 tablet (25 mg total) by mouth daily.   tamsulosin (FLOMAX) 0.4 MG CAPS capsule Take 1 capsule (0.4 mg total) by mouth daily.   No facility-administered encounter medications on file as of 11/14/2021.    Allergies (verified) Patient has no known allergies.   History: Past Medical History:   Diagnosis Date   Allergy    Arthritis    knees, hands   Hypertension    Myocardial infarction ([place]) 07/2017   Pneumonia    Seizures ([place])    approx 1970's, 2005, June 2019  Testing showed no cause. Extended EEG scheduled for November 2019   Past Surgical History:  Procedure Laterality Date   BICEPT TENODESIS Right 07/18/2018   Procedure: BICEPS TENODESIS;  Surgeon: Corky Mull, MD;  Location: [facility] OR[facility];  Service: Orthopedics;  Laterality: Right;   COLONOSCOPY  2011   cleared for 10 yrs   COLONOSCOPY WITH PROPOFOL N/A 11/06/2016   Procedure: COLONOSCOPY WITH PROPOFOL;  Surgeon: [name], MD;  Location: [place];  Service: Endoscopy;  Laterality: N/A;   CORONARY STENT INTERVENTION N/A 07/23/2017   Procedure: CORONARY STENT INTERVENTION;  Surgeon: Yolonda Kida, MD;  Location: [place] CV LAB;  Service: Cardiovascular;  Laterality: N/A;   CYST EXCISION     Forearm   HERNIA REPAIR     KNEE SURGERY Bilateral    2 on R) 1 on L)   LEFT HEART CATH AND CORONARY ANGIOGRAPHY N/A 07/23/2017   Procedure: LEFT HEART CATH AND CORONARY ANGIOGRAPHY;  Surgeon: Minna Merritts, MD;  Location: [place] CV LAB;  Service: Cardiovascular;  Laterality: N/A;   POLYPECTOMY  11/06/2016   Procedure: POLYPECTOMY;  Surgeon: [name], MD;  Location: [place];  Service: Endoscopy;;   SHOULDER ARTHROSCOPY WITH OPEN ROTATOR CUFF REPAIR Right 07/18/2018   Procedure: SHOULDER ARTHROSCOPY WITH OPEN ROTATOR CUFF REPAIR;  Surgeon: Corky Mull, MD;  Location: [facility];  Service: Orthopedics;  Laterality: Right;   Family History  Problem Relation Age of Onset   CAD Father    Lung cancer Father    Heart failure Mother    Social History   Socioeconomic History   Marital status: Married    Spouse name: Arbie Cookey   Number of children: 2   Years of education: Not on file   Highest education level: Not on file  Occupational History   Not on file  Tobacco Use   Smoking  status: Former   Smokeless tobacco: Never   Tobacco comments:    quit over 20 yrs ago  Vaping Use   Vaping Use: Never used  Substance and Sexual Activity   Alcohol use: Yes    Alcohol/week: 0.0 standard drinks    Comment: 1-2 drinks/year   Drug use: No   Sexual activity: Not Currently  Other Topics Concern   Not on file  Social History Narrative   Not on file   Social Determinants of Health   Financial Resource Strain: Low Risk    Difficulty of Paying Living Expenses: Not hard at all  Food Insecurity: No Food Insecurity   Worried About Charity fundraiser in the Last Year: Never true   [place] in the Last Year: Never true  Transportation Needs: No Transportation Needs   Lack of Transportation (Medical): No   Lack of Transportation (Non-Medical): No  Physical Activity: Sufficiently Active   Days of Exercise per Week: 7 days  Minutes of Exercise per Session: 30 min  Stress: No Stress Concern Present   Feeling of Stress : Not at all  Social Connections: Moderately Isolated   Frequency of Communication with Friends and Family: More than three times a week   Frequency of Social Gatherings with Friends and Family: Twice a week   Attends Religious Services: Never   Marine scientist or Organizations: No   Attends Music therapist: Never   Marital Status: Married    Tobacco Counseling Counseling given: Not Answered Tobacco comments: quit over 20 yrs ago   Clinical Intake:  Pre-visit preparation completed: Yes  Pain : No/denies pain     Nutritional Risks: None Diabetes: No  How often do you need to have someone help you when you read instructions, pamphlets, or other written materials from your doctor or pharmacy?: 1 - Never    Interpreter Needed?: No  Information entered by :: [name] LPN   Activities of Daily Living In your present state of health, do you have any difficulty performing the following activities: 11/14/2021  08/01/2021  Hearing? Y N  Vision? N N  Difficulty concentrating or making decisions? N N  Walking or climbing stairs? N Y  Dressing or bathing? N N  Doing errands, shopping? N N  Preparing Food and eating ? N -  Using the Toilet? N -  In the past six months, have you accidently leaked urine? N -  Do you have problems with loss of bowel control? N -  Managing your Medications? N -  Managing your Finances? N -  Housekeeping or managing your Housekeeping? N -  Some recent data might be hidden    Patient Care Team: Juline Patch, MD as PCP - General (Family Medicine) Rockey Situ Kathlene November, MD as Consulting Physician (Cardiology) Anabel Bene, MD as Referring Physician (Neurology) Billey Co, MD as Consulting Physician (Urology) [name], MD as Consulting Physician (Gastroenterology)  Indicate any recent Medical Services you may have received from other than [facility] providers in the past year (date may be approximate).     Assessment:   This is a routine wellness examination for [place].  Hearing/Vision screen Hearing Screening - Comments:: Pt states mild hearing difficulty occasionally in right ear due to allergies and congestion; declines hearing aids Vision Screening - Comments:: Vision screenings with [facility]   Dietary issues and exercise activities discussed: Current Exercise Habits: Home exercise routine, Type of exercise: stretching;calisthenics;strength training/weights, Time (Minutes): 30, Frequency (Times/Week): 7, Weekly Exercise (Minutes/Week): 210, Intensity: Mild, Exercise limited by: None identified   Goals Addressed             This Visit's Progress    DIET - INCREASE WATER INTAKE   On track    Recommend drinking 6-8 glasses of water per day        Depression Screen [facility] 2/9 Scores 11/14/2021 08/01/2021 04/06/2021 12/17/2020 11/10/2020 11/05/2020 06/09/2020  PHQ - 2 Score 0 0 0 0 0 0 0  PHQ- 9 Score 0 0 0 0 - 0 0    Fall Risk Fall Risk   11/14/2021 08/01/2021 04/06/2021 11/10/2020 06/17/2020  Falls in the past year? 0 0 0 0 0  Number falls in past yr: 0 0 0 0 -  Injury with Fall? 0 0 0 0 -  Risk for fall due to : No Fall Risks - No Fall Risks No Fall Risks -  Risk for fall due to: Comment - - - - -  Follow up Falls prevention discussed - Falls evaluation completed Falls prevention discussed Falls evaluation completed    FALL RISK PREVENTION PERTAINING TO THE HOME:  Any stairs in or around the home? No If so, are there any without handrails? No  Home free of loose throw rugs in walkways, pet beds, electrical cords, etc? Yes  Adequate lighting in your home to reduce risk of falls? Yes   ASSISTIVE DEVICES UTILIZED TO PREVENT FALLS:  Life alert? No  Use of a cane, walker or w/c? No  Grab bars in the bathroom? Yes  Shower chair or bench in shower? No  Elevated toilet seat or a handicapped toilet? No   TIMED UP AND GO:  Was the test performed? No . Telephonic visit.   Cognitive Function: Normal cognitive status assessed by direct observation by this Nurse Health Advisor. No abnormalities found.       6CIT Screen 11/10/2019  What Year? 0 points  What month? 0 points  What time? 0 points  Count back from 20 0 points  Months in reverse 0 points  Repeat phrase 2 points  Total Score 2    Immunizations Immunization History  Administered Date(s) Administered   Fluad Quad(high Dose 65+) 06/18/2019   Influenza, High Dose Seasonal PF 06/25/2018, 07/01/2021   Influenza,inj,Quad PF,6+ Mos 05/10/2017   Influenza-Unspecified 06/28/2015, 06/07/2020   PFIZER(Purple Top)SARS-COV-2 Vaccination 10/11/2019, 11/01/2019, 06/19/2020   Pneumococcal Conjugate-13 10/12/2016   Pneumococcal Polysaccharide-23 07/12/2015, 12/25/2017   Tdap 10/12/2016   Zoster Recombinat (Shingrix) 04/26/2017, 08/23/2017    TDAP status: Up to date  Flu Vaccine status: Up to date  Pneumococcal vaccine status: Up to date  Covid-19 vaccine status:  Completed vaccines  Qualifies for Shingles Vaccine? Yes   Zostavax completed No   Shingrix Completed?: Yes  Screening Tests Health Maintenance  Topic Date Due   COVID-19 Vaccine (4 - Booster for Pfizer series) 08/14/2020   COLONOSCOPY (Pts 45-20yrs Insurance coverage will need to be confirmed)  11/06/2021   TETANUS/TDAP  10/12/2026   Pneumonia Vaccine 10+ Years old  Completed   INFLUENZA VACCINE  Completed   Hepatitis C Screening  Completed   Zoster Vaccines- Shingrix  Completed   HPV VACCINES  Aged Out    Health Maintenance  Health Maintenance Due  Topic Date Due   COVID-19 Vaccine (4 - Booster for [place] series) 08/14/2020   COLONOSCOPY (Pts 45-70yrs Insurance coverage will need to be confirmed)  11/06/2021    Colorectal cancer screening: Type of screening: Colonoscopy. Completed 11/06/16. Repeat every 5 years. Referral sent today.   Lung Cancer Screening: (Low Dose CT Chest recommended if Age 35-80 years, 30 pack-year currently smoking OR have quit w/in 15years.) does not qualify.    Additional Screening:  Hepatitis C Screening: does qualify; Completed 05/10/17  Vision Screening: Recommended annual ophthalmology exams for early detection of glaucoma and other disorders of the eye. Is the patient up to date with their annual eye exam?  Yes  Who is the provider or what is the name of the office in which the patient attends annual eye exams? [facility].   Dental Screening: Recommended annual dental exams for proper oral hygiene  Community Resource Referral / Chronic Care Management: CRR required this visit?  No   CCM required this visit?  No      Plan:     I have personally reviewed and noted the following in the patients chart:   Medical and social history Use of alcohol, tobacco or illicit  drugs  Current medications and supplements including opioid prescriptions. Patient is not currently taking opioid prescriptions. Functional ability and  status Nutritional status Physical activity Advanced directives List of other physicians Hospitalizations, surgeries, and ER visits in previous 12 months Vitals Screenings to include cognitive, depression, and falls Referrals and appointments  In addition, I have reviewed and discussed with patient certain preventive protocols, quality metrics, and best practice recommendations. A written personalized care plan for preventive services as well as general preventive health recommendations were provided to patient.     [name], LPN   04/15/2059   Nurse Notes: none

## 2021-11-14 NOTE — Patient Instructions (Signed)
George Doyle , Thank you for taking time to come for your Medicare Wellness Visit. I appreciate your ongoing commitment to your health goals. Please review the following plan we discussed and let me know if I can assist you in the future.   Screening recommendations/referrals: Colonoscopy: done 11/06/16. A referral has been sent to [facility] for repeat screening colonoscopy. They will contact you for an appointment Recommended yearly ophthalmology/optometry visit for glaucoma screening and checkup Recommended yearly dental visit for hygiene and checkup  Vaccinations: Influenza vaccine: done 07/01/21 Pneumococcal vaccine: done 12/25/17 Tdap vaccine: done 10/12/16 Shingles vaccine: done 04/26/17 & 08/23/17   Covid-19: done 10/11/19, 11/01/19 7 06/19/20  Conditions/risks identified: Keep up the great work!  Next appointment: Follow up in one year for your annual wellness visit.   Preventive Care 35 Years and Older, Male Preventive care refers to lifestyle choices and visits with your health care provider that can promote health and wellness. What does preventive care include? A yearly physical exam. This is also called an annual well check. Dental exams once or twice a year. Routine eye exams. Ask your health care provider how often you should have your eyes checked. Personal lifestyle choices, including: Daily care of your teeth and gums. Regular physical activity. Eating a healthy diet. Avoiding tobacco and drug use. Limiting alcohol use. Practicing safe sex. Taking low doses of aspirin every day. Taking vitamin and mineral supplements as recommended by your health care provider. What happens during an annual well check? The services and screenings done by your health care provider during your annual well check will depend on your age, overall health, lifestyle risk factors, and family history of disease. Counseling  Your health care provider may ask you questions about  your: Alcohol use. Tobacco use. Drug use. Emotional well-being. Home and relationship well-being. Sexual activity. Eating habits. History of falls. Memory and ability to understand (cognition). Work and work Statistician. Screening  You may have the following tests or measurements: Height, weight, and BMI. Blood pressure. Lipid and cholesterol levels. These may be checked every 5 years, or more frequently if you are over 110 years old. Skin check. Lung cancer screening. You may have this screening every year starting at age 84 if you have a 30-pack-year history of smoking and currently smoke or have quit within the past 15 years. Fecal occult blood test (FOBT) of the stool. You may have this test every year starting at age 19. Flexible sigmoidoscopy or colonoscopy. You may have a sigmoidoscopy every 5 years or a colonoscopy every 10 years starting at age 37. Prostate cancer screening. Recommendations will vary depending on your family history and other risks. Hepatitis C blood test. Hepatitis B blood test. Sexually transmitted disease (STD) testing. Diabetes screening. This is done by checking your blood sugar (glucose) after you have not eaten for a while (fasting). You may have this done every 1-3 years. Abdominal aortic aneurysm (AAA) screening. You may need this if you are a current or former smoker. Osteoporosis. You may be screened starting at age 18 if you are at high risk. Talk with your health care provider about your test results, treatment options, and if necessary, the need for more tests. Vaccines  Your health care provider may recommend certain vaccines, such as: Influenza vaccine. This is recommended every year. Tetanus, diphtheria, and acellular pertussis (Tdap, Td) vaccine. You may need a Td booster every 10 years. Zoster vaccine. You may need this after age 60. Pneumococcal 13-valent conjugate (PCV13) vaccine.  One dose is recommended after age 58. Pneumococcal  polysaccharide (PPSV23) vaccine. One dose is recommended after age 34. Talk to your health care provider about which screenings and vaccines you need and how often you need them. This information is not intended to replace advice given to you by your health care provider. Make sure you discuss any questions you have with your health care provider. Document Released: 10/01/2015 Document Revised: 05/24/2016 Document Reviewed: 07/06/2015 Elsevier Interactive Patient Education  2017 [place] Prevention in the Home Falls can cause injuries. They can happen to people of all ages. There are many things you can do to make your home safe and to help prevent falls. What can I do on the outside of my home? Regularly fix the edges of walkways and driveways and fix any cracks. Remove anything that might make you trip as you walk through a door, such as a raised step or threshold. Trim any bushes or trees on the path to your home. Use bright outdoor lighting. Clear any walking paths of anything that might make someone trip, such as rocks or tools. Regularly check to see if handrails are loose or broken. Make sure that both sides of any steps have handrails. Any raised decks and porches should have guardrails on the edges. Have any leaves, snow, or ice cleared regularly. Use sand or salt on walking paths during winter. Clean up any spills in your garage right away. This includes oil or grease spills. What can I do in the bathroom? Use night lights. Install grab bars by the toilet and in the tub and shower. Do not use towel bars as grab bars. Use non-skid mats or decals in the tub or shower. If you need to sit down in the shower, use a plastic, non-slip stool. Keep the floor dry. Clean up any water that spills on the floor as soon as it happens. Remove soap buildup in the tub or shower regularly. Attach bath mats securely with double-sided non-slip rug tape. Do not have throw rugs and other  things on the floor that can make you trip. What can I do in the bedroom? Use night lights. Make sure that you have a light by your bed that is easy to reach. Do not use any sheets or blankets that are too big for your bed. They should not hang down onto the floor. Have a firm chair that has side arms. You can use this for support while you get dressed. Do not have throw rugs and other things on the floor that can make you trip. What can I do in the kitchen? Clean up any spills right away. Avoid walking on wet floors. Keep items that you use a lot in easy-to-reach places. If you need to reach something above you, use a strong step stool that has a grab bar. Keep electrical cords out of the way. Do not use floor polish or wax that makes floors slippery. If you must use wax, use non-skid floor wax. Do not have throw rugs and other things on the floor that can make you trip. What can I do with my stairs? Do not leave any items on the stairs. Make sure that there are handrails on both sides of the stairs and use them. Fix handrails that are broken or loose. Make sure that handrails are as long as the stairways. Check any carpeting to make sure that it is firmly attached to the stairs. Fix any carpet that is loose or  worn. Avoid having throw rugs at the top or bottom of the stairs. If you do have throw rugs, attach them to the floor with carpet tape. Make sure that you have a light switch at the top of the stairs and the bottom of the stairs. If you do not have them, ask someone to add them for you. What else can I do to help prevent falls? Wear shoes that: Do not have high heels. Have rubber bottoms. Are comfortable and fit you well. Are closed at the toe. Do not wear sandals. If you use a stepladder: Make sure that it is fully opened. Do not climb a closed stepladder. Make sure that both sides of the stepladder are locked into place. Ask someone to hold it for you, if possible. Clearly  mark and make sure that you can see: Any grab bars or handrails. First and last steps. Where the edge of each step is. Use tools that help you move around (mobility aids) if they are needed. These include: Canes. Walkers. Scooters. Crutches. Turn on the lights when you go into a dark area. Replace any light bulbs as soon as they burn out. Set up your furniture so you have a clear path. Avoid moving your furniture around. If any of your floors are uneven, fix them. If there are any pets around you, be aware of where they are. Review your medicines with your doctor. Some medicines can make you feel dizzy. This can increase your chance of falling. Ask your doctor what other things that you can do to help prevent falls. This information is not intended to replace advice given to you by your health care provider. Make sure you discuss any questions you have with your health care provider. Document Released: 07/01/2009 Document Revised: 02/10/2016 Document Reviewed: 10/09/2014 Elsevier Interactive Patient Education  2017 [employer].

## 2021-11-15 ENCOUNTER — Telehealth: Payer: Self-pay

## 2021-11-15 NOTE — Telephone Encounter (Signed)
Patient wanted to be taken off our call list he will call us to schedule

## 2021-11-18 ENCOUNTER — Other Ambulatory Visit: Payer: Self-pay

## 2021-11-18 MED ORDER — SPIRONOLACTONE 25 MG PO TABS: 25.0000 mg | ORAL_TABLET | Freq: Every day | ORAL | 0 refills | Status: DC

## 2021-11-29 ENCOUNTER — Ambulatory Visit: Payer: Medicare HMO | Admitting: Family Medicine

## 2021-11-29 ENCOUNTER — Other Ambulatory Visit: Payer: Self-pay

## 2021-11-29 ENCOUNTER — Encounter: Payer: Self-pay | Admitting: Family Medicine

## 2021-11-29 VITALS — BP 130/70 | HR 60 | Ht 69.0 in | Wt 213.0 lb

## 2021-11-29 DIAGNOSIS — E785 Hyperlipidemia, unspecified: Secondary | ICD-10-CM | POA: Diagnosis not present

## 2021-11-29 DIAGNOSIS — I2510 Atherosclerotic heart disease of native coronary artery without angina pectoris: Secondary | ICD-10-CM

## 2021-11-29 DIAGNOSIS — S20212A Contusion of left front wall of thorax, initial encounter: Secondary | ICD-10-CM | POA: Diagnosis not present

## 2021-11-29 MED ORDER — CLOPIDOGREL BISULFATE 75 MG PO TABS: ORAL_TABLET | ORAL | 1 refills | Status: DC

## 2021-11-29 MED ORDER — ATORVASTATIN CALCIUM 80 MG PO TABS: ORAL_TABLET | ORAL | 1 refills | Status: DC

## 2021-11-29 NOTE — Progress Notes (Signed)
? ? ?Date:  11/29/2021  ? ?Name:  George Doyle   DOB:  August 29, 1951   MRN:  [id number] ? ? ?Chief Complaint: Hyperlipidemia and Coronary Artery Disease ? ?Hyperlipidemia ?This is a chronic problem. The current episode started more than 1 year ago. The problem is controlled. Recent lipid tests were reviewed and are normal. He has no history of chronic renal disease, diabetes, hypothyroidism, liver disease, obesity or nephrotic syndrome. Factors aggravating his hyperlipidemia include thiazides. Associated symptoms include chest pain. Pertinent negatives include no focal sensory loss, focal weakness, leg pain, myalgias or shortness of breath. The current treatment provides moderate improvement of lipids. There are no compliance problems.  There are no known risk factors for coronary artery disease.  ?Coronary Artery Disease ?Presents for follow-up visit. Symptoms include chest pain. Pertinent negatives include no chest tightness, dizziness, leg swelling, palpitations, shortness of breath or weight gain. Risk factors include hyperlipidemia. Risk factors do not include obesity. The symptoms have been stable.  ?Chest Pain  ?This is a new ("sore") problem. The current episode started 1 to 4 weeks ago. The problem occurs constantly. The problem has been waxing and waning. Pain location: perinipple. Quality: sore. Pertinent negatives include no abdominal pain, back pain, cough, dizziness, fever, headaches, leg pain, nausea, palpitations or shortness of breath.  ?His past medical history is significant for CAD and hyperlipidemia.  ?Pertinent negatives for past medical history include no diabetes.  ? ?Lab Results  ?Component Value Date  ? NA 133 (L) 08/01/2021  ? K 5.1 08/01/2021  ? CO2 25 08/01/2021  ? GLUCOSE 87 08/01/2021  ? BUN 20 08/01/2021  ? CREATININE 1.14 08/01/2021  ? CALCIUM 9.5 08/01/2021  ? EGFR 68 08/01/2021  ? GFRNONAA 53 (L) 07/15/2021  ? ?Lab Results  ?Component Value Date  ? CHOL 111 12/17/2020  ?  HDL 51 12/17/2020  ? LDLCALC 44 12/17/2020  ? TRIG 76 12/17/2020  ? CHOLHDL 2.1 06/11/2018  ? ?No results found for: TSH ?No results found for: HGBA1C ?Lab Results  ?Component Value Date  ? WBC 8.3 08/01/2021  ? HGB 13.3 08/01/2021  ? HCT 39.4 08/01/2021  ? MCV 90 08/01/2021  ? PLT 292 08/01/2021  ? ?Lab Results  ?Component Value Date  ? ALT 17 07/15/2021  ? AST 24 07/15/2021  ? ALKPHOS 69 07/15/2021  ? BILITOT 1.3 (H) 07/15/2021  ? ?No results found for: 25OHVITD2, [place], VD25OH  ? ?Review of Systems  ?Constitutional:  Negative for chills, fever and weight gain.  ?HENT:  Negative for drooling, ear discharge, ear pain and sore throat.   ?Respiratory:  Negative for cough, chest tightness, shortness of breath and wheezing.   ?Cardiovascular:  Positive for chest pain. Negative for palpitations and leg swelling.  ?Gastrointestinal:  Negative for abdominal pain, blood in stool, constipation, diarrhea and nausea.  ?Endocrine: Negative for polydipsia.  ?Genitourinary:  Negative for dysuria, frequency, hematuria and urgency.  ?Musculoskeletal:  Negative for back pain, myalgias and neck pain.  ?Skin:  Negative for rash.  ?Allergic/Immunologic: Negative for environmental allergies.  ?Neurological:  Negative for dizziness, focal weakness and headaches.  ?Hematological:  Does not bruise/bleed easily.  ?Psychiatric/Behavioral:  Negative for suicidal ideas. The patient is not nervous/anxious.   ? ?Patient Active Problem List  ? Diagnosis Date Noted  ? Benign neoplasm of skin of left forearm 01/27/2019  ? Pure hypercholesterolemia 06/21/2018  ? Poorly-controlled hypertension 06/21/2018  ? Seizure disorder ([place]) 06/21/2018  ? Hill-Sachs lesion of right shoulder 06/19/2018  ?  Injury of tendon of long head of right biceps 06/19/2018  ? Rotator cuff tendinitis, right 06/19/2018  ? Traumatic complete tear of right rotator cuff 06/19/2018  ? Headache disorder 05/09/2018  ? CAD (coronary artery disease), native coronary artery  03/28/2018  ? History of seizures 03/28/2018  ? Subdural hematoma 03/27/2018  ? Vasovagal syncope 03/27/2018  ? Old MI (myocardial infarction) 08/30/2017  ? CAD, multiple vessel 08/01/2017  ? NSTEMI (non-ST elevated myocardial infarction) ([place]) 07/20/2017  ? Hyperlipidemia 05/10/2017  ? Preop cardiovascular exam   ? Benign neoplasm of ascending colon   ? Polyp of sigmoid colon   ? Essential hypertension 03/03/2016  ? ? ?No Known Allergies ? ?Past Surgical History:  ?Procedure Laterality Date  ? BICEPT TENODESIS Right 07/18/2018  ? Procedure: BICEPS TENODESIS;  Surgeon: Corky Mull, MD;  Location: [facility];  Service: Orthopedics;  Laterality: Right;  ? COLONOSCOPY  2011  ? cleared for 10 yrs  ? COLONOSCOPY WITH PROPOFOL N/A 11/06/2016  ? Procedure: COLONOSCOPY WITH PROPOFOL;  Surgeon: [name], MD;  Location: [place];  Service: Endoscopy;  Laterality: N/A;  ? CORONARY STENT INTERVENTION N/A 07/23/2017  ? Procedure: CORONARY STENT INTERVENTION;  Surgeon: Yolonda Kida, MD;  Location: [place] [facility];  Service: Cardiovascular;  Laterality: N/A;  ? CYST EXCISION    ? Forearm  ? HERNIA REPAIR    ? KNEE SURGERY Bilateral   ? 2 on R) 1 on L)  ? LEFT HEART CATH AND CORONARY ANGIOGRAPHY N/A 07/23/2017  ? Procedure: LEFT HEART CATH AND CORONARY ANGIOGRAPHY;  Surgeon: Minna Merritts, MD;  Location: [place] [facility];  Service: Cardiovascular;  Laterality: N/A;  ? POLYPECTOMY  11/06/2016  ? Procedure: POLYPECTOMY;  Surgeon: [name], MD;  Location: [place];  Service: Endoscopy;;  ? SHOULDER ARTHROSCOPY WITH OPEN ROTATOR CUFF REPAIR Right 07/18/2018  ? Procedure: SHOULDER ARTHROSCOPY WITH OPEN ROTATOR CUFF REPAIR;  Surgeon: Corky Mull, MD;  Location: [facility];  Service: Orthopedics;  Laterality: Right;  ? ? ?Social History  ? ?Tobacco Use  ? Smoking status: Former  ? Smokeless tobacco: Never  ? Tobacco comments:  ?  quit over 20 yrs ago  ?Vaping Use  ? Vaping Use: Never used   ?Substance Use Topics  ? Alcohol use: Yes  ?  Alcohol/week: 0.0 standard drinks  ?  Comment: 1-2 drinks/year  ? Drug use: No  ? ? ? ?Medication list has been reviewed and updated. ? ?Current Meds  ?Medication Sig  ? acetaminophen (TYLENOL) 500 MG tablet Take 500 mg by mouth at bedtime. As needed/ otc  ? aspirin EC 81 MG EC tablet Take 1 tablet (81 mg total) daily by mouth.  ? atorvastatin (LIPITOR) 80 MG tablet TAKE 1 TABLET(80 MG) BY MOUTH DAILY AT 6 PM  ? carvedilol (COREG) 12.5 MG tablet Take 12.5 mg by mouth 2 (two) times daily.  ? citalopram (CELEXA) 20 MG tablet Take 20 mg by mouth daily.  ? clopidogrel (PLAVIX) 75 MG tablet TAKE 1 TABLET(75 MG) BY MOUTH DAILY  ? EPINEPHrine 0.3 mg/0.3 mL IJ SOAJ injection Inject 0.3 mg into the muscle as needed for anaphylaxis.  ? fexofenadine (ALLEGRA) 180 MG tablet Take 180 mg by mouth as needed for allergies or rhinitis. otc  ? lamoTRIgine (LAMICTAL) 200 MG tablet Take 200 mg by mouth daily.  ? latanoprost (XALATAN) 0.005 % ophthalmic solution Place 1 drop into both eyes at bedtime.  ? losartan (COZAAR) 100 MG  tablet Take 1 tablet (100 mg total) by mouth daily.  ? nitroGLYCERIN (NITROSTAT) 0.4 MG SL tablet PLACE 1 TABLET UNDER THE TONGUE EVERY 5 MINUTES FOR 3 DOSES AS NEEDED FOR CHEST PAIN  ? spironolactone (ALDACTONE) 25 MG tablet Take 1 tablet (25 mg total) by mouth daily.  ? ? ?PHQ 2/9 Scores 11/29/2021 11/14/2021 08/01/2021 04/06/2021  ?PHQ - 2 Score 0 0 0 0  ?PHQ- 9 Score 0 0 0 0  ? ? ?GAD 7 : Generalized Anxiety Score 11/29/2021 08/01/2021 04/06/2021 12/17/2020  ?Nervous, Anxious, on Edge 1 1 0 0  ?Control/stop worrying 0 0 0 0  ?Worry too much - different things 0 0 0 0  ?Trouble relaxing 0 0 0 0  ?Restless 0 0 0 0  ?Easily annoyed or irritable 0 0 0 1  ?Afraid - awful might happen 0 0 0 0  ?Total GAD 7 Score 1 1 0 1  ?Anxiety Difficulty Not difficult at all - - Not difficult at all  ? ? ?BP Readings from Last 3 Encounters:  ?11/29/21 130/70  ?08/09/21 (!) 146/75   ?08/01/21 120/80  ? ? ?Physical Exam ?Vitals and nursing note reviewed.  ?HENT:  ?   Right Ear: Tympanic membrane, ear canal and external ear normal.  ?   Left Ear: Tympanic membrane, ear canal and external ear normal.  ?

## 2021-11-30 LAB — LIPID PANEL WITH LDL/HDL RATIO
Cholesterol, Total: 103 mg/dL (ref 100–199)
HDL: 56 mg/dL (ref >39)
LDL Chol Calc (NIH): 31 mg/dL (ref 0–99)
LDL/HDL Ratio: 0.6 ratio (ref 0.0–3.6)
Triglycerides: 81 mg/dL (ref 0–149)
VLDL Cholesterol Cal: 16 mg/dL (ref 5–40)

## 2021-12-20 ENCOUNTER — Other Ambulatory Visit: Payer: Self-pay

## 2021-12-20 DIAGNOSIS — I10 Essential (primary) hypertension: Secondary | ICD-10-CM

## 2021-12-20 MED ORDER — LOSARTAN POTASSIUM 100 MG PO TABS: 100.0000 mg | ORAL_TABLET | Freq: Every day | ORAL | 0 refills | Status: DC

## 2022-02-07 ENCOUNTER — Other Ambulatory Visit: Payer: Self-pay | Admitting: *Deleted

## 2022-02-07 NOTE — Telephone Encounter (Signed)
LMOVM to verify dose of Carvedilol mg tablet. Pharmacy requested Carvedilol 12.5 mg tablet bid.

## 2022-02-08 MED ORDER — CARVEDILOL 12.5 MG PO TABS: 12.5000 mg | ORAL_TABLET | Freq: Two times a day (BID) | ORAL | 3 refills | Status: DC

## 2022-02-08 NOTE — Telephone Encounter (Signed)
   Pt's wife returning call. She verified Carvedilol 12.5 mg tablet bid. This is the dose pt is currently taking. She also, wanted to know why it needs to verify since pt been taking this dose for years

## 2022-02-08 NOTE — Telephone Encounter (Signed)
Patient's wife returned call.  

## 2022-02-09 ENCOUNTER — Encounter: Payer: Self-pay | Admitting: Cardiovascular Disease

## 2022-02-16 ENCOUNTER — Other Ambulatory Visit: Payer: Self-pay | Admitting: Cardiovascular Disease

## 2022-02-17 ENCOUNTER — Telehealth: Payer: Self-pay | Admitting: Cardiovascular Disease

## 2022-02-17 ENCOUNTER — Other Ambulatory Visit: Payer: Self-pay | Admitting: Cardiovascular Disease

## 2022-02-17 MED ORDER — SPIRONOLACTONE 25 MG PO TABS: ORAL_TABLET | ORAL | 0 refills | Status: DC

## 2022-02-17 NOTE — Telephone Encounter (Signed)
*  STAT* If patient is at the pharmacy, call can be transferred to refill team.   1. Which medications need to be refilled? (please list name of each medication and dose if known)   spironolactone (ALDACTONE) 25 MG tablet    2. Which pharmacy/location (including street and city if local pharmacy) is medication to be sent to? [facility] [place], [address] AT [facility] [address]  3. Do they need a 30 day or 90 day supply?  90 day

## 2022-02-17 NOTE — Telephone Encounter (Signed)
Requested Prescriptions   Signed Prescriptions Disp Refills   spironolactone (ALDACTONE) 25 MG tablet 90 tablet 0    Sig: TAKE 1 TABLET(25 MG) BY MOUTH DAILY    Authorizing Provider: Minna Merritts    Ordering User: Britt Bottom

## 2022-02-27 ENCOUNTER — Encounter: Payer: Self-pay | Admitting: Cardiovascular Disease

## 2022-02-27 ENCOUNTER — Ambulatory Visit: Payer: Medicare HMO | Admitting: Cardiovascular Disease

## 2022-02-27 DIAGNOSIS — I10 Essential (primary) hypertension: Secondary | ICD-10-CM

## 2022-02-27 MED ORDER — CARVEDILOL 6.25 MG PO TABS: 12.5000 mg | ORAL_TABLET | Freq: Two times a day (BID) | ORAL | 3 refills | Status: DC

## 2022-02-27 MED ORDER — LOSARTAN POTASSIUM 50 MG PO TABS: 50.0000 mg | ORAL_TABLET | Freq: Two times a day (BID) | ORAL | 3 refills | Status: DC

## 2022-02-27 NOTE — Patient Instructions (Addendum)
Medication Instructions:  Please decrease the coreg down to 6.25 mg twice a day Losartan 50 mg twice a day (not 100 mg in the Am)  Please stop the plavix  If you need a refill on your cardiac medications before your next appointment, please call your pharmacy.   Lab work: No new labs needed  Testing/Procedures: No new testing needed  Follow-Up: At [facility], you and your health needs are our priority.  As part of our continuing mission to provide you with exceptional heart care, we have created designated Provider Care Teams.  These Care Teams include your primary Cardiologist (physician) and Advanced Practice Providers (APPs -  Physician Assistants and Nurse Practitioners) who all work together to provide you with the care you need, when you need it.  You will need a follow up appointment in 12 months  Providers on your designated Care Team:   Murray Hodgkins, NP Christell Faith, PA-C Cadence Kathlen Mody, [place]  COVID-19 Vaccine Information can be found at: [email or web address] For questions related to vaccine distribution or appointments, please email [email or web address] or call [phone].

## 2022-02-27 NOTE — Progress Notes (Signed)
Cardiology Office Note  Date:  02/27/2022   ID:  George Doyle, DOB [birth year], MRN [id number]  PCP:  Juline Patch, MD   Chief Complaint  Patient presents with   Hypotension    Patient c/o feeling faint that comes and goes with most days being in the mornings. Medications reviewed by the patient verbally.     HPI:  George Doyle is a pleasant 71 yo male with history of  Seizure disorder 2005 in July 2019 HTN  former tobacco abuse  admitted with chest pain 07/21/2017 NSTEMI, TNT 14 Cath 07/23/2017  , Occluded proximal RCA, collaterals from left to right, Moderate to severe mid Left circumflex dz, tortuous vessel, calcified Moderate inferior wall hypokinesis attempt PCI to the RCA was unsuccessful, possibly went subintimal Seen by Dr. Irish Lack August 30, 2017 Medical management recommended of his occluded RCA He presents for follow-up of his coronary artery disease, stable angina, preop cardiovascular  Last seen in clinic by myself August 2022  Dizzy in the AM, consistent with orthostasis On coreg 12.5 twice daily, losartan 100 in the morning, spironolactone  Takes all medications in the morning with additional episode of carvedilol in the evening  Orthostasis numbers this morning lowest blood pressure down to 401 systolic heart rates running in the low 50s  No regular exercise program Tries to stay active, does jobs around the house Recent fall while using a shovel outside  Arthritic issues, shoulder, knee Reports extensive bruising on his arms Remains on aspirin Plavix  Lab work reviewed Total cholesterol 103, LDL 31  EKG personally reviewed by myself on todays visit Shows normal sinus rhythm rate 51 bpm ST abnormality V45 through V6, 1 and aVL old inferior MI  Discussed prior events with him  emergency room March 27, 2018 for seizure Hospital records reviewed with the patient in detail  full body tonic-clonic seizure-like activity, hurt his right  shoulder at that time CT scan head subdural hematoma, Received IV Keppra.   was transferred to [facility]  Echo November 2018  ejection fraction was in the range of 50% to 55%. Aorta: Aortic root was mildly dilated, 3.6 cm - Ascending aorta: The ascending aorta was mildly dilated, 4.1 cm   PMH:   has a past medical history of Allergy, Arthritis, Hypertension, Myocardial infarction ([place]) (07/2017), Pneumonia, and Seizures ([place]).  PSH:    Past Surgical History:  Procedure Laterality Date   BICEPT TENODESIS Right 07/18/2018   Procedure: BICEPS TENODESIS;  Surgeon: Corky Mull, MD;  Location: [facility];  Service: Orthopedics;  Laterality: Right;   COLONOSCOPY  2011   cleared for 10 yrs   COLONOSCOPY WITH PROPOFOL N/A 11/06/2016   Procedure: COLONOSCOPY WITH PROPOFOL;  Surgeon: [name], MD;  Location: [facility];  Service: Endoscopy;  Laterality: N/A;   CORONARY STENT INTERVENTION N/A 07/23/2017   Procedure: CORONARY STENT INTERVENTION;  Surgeon: Yolonda Kida, MD;  Location: [place] [facility];  Service: Cardiovascular;  Laterality: N/A;   CYST EXCISION     Forearm   HERNIA REPAIR     KNEE SURGERY Bilateral    2 on R) 1 on L)   LEFT HEART CATH AND CORONARY ANGIOGRAPHY N/A 07/23/2017   Procedure: LEFT HEART CATH AND CORONARY ANGIOGRAPHY;  Surgeon: Minna Merritts, MD;  Location: [place] [facility];  Service: Cardiovascular;  Laterality: N/A;   POLYPECTOMY  11/06/2016   Procedure: POLYPECTOMY;  Surgeon: [name], MD;  Location: [place];  Service: Endoscopy;;  SHOULDER ARTHROSCOPY WITH OPEN ROTATOR CUFF REPAIR Right 07/18/2018   Procedure: SHOULDER ARTHROSCOPY WITH OPEN ROTATOR CUFF REPAIR;  Surgeon: Corky Mull, MD;  Location: [facility];  Service: Orthopedics;  Laterality: Right;    Current Outpatient Medications  Medication Sig Dispense Refill   acetaminophen (TYLENOL) 500 MG tablet Take 500 mg by mouth at bedtime. As needed/ otc     aspirin  EC 81 MG EC tablet Take 1 tablet (81 mg total) daily by mouth. 30 tablet 1   atorvastatin (LIPITOR) 80 MG tablet TAKE 1 TABLET(80 MG) BY MOUTH DAILY AT 6 PM 90 tablet 1   carvedilol (COREG) 12.5 MG tablet Take 1 tablet (12.5 mg total) by mouth 2 (two) times daily. 180 tablet 3   citalopram (CELEXA) 20 MG tablet Take 20 mg by mouth daily.     clopidogrel (PLAVIX) 75 MG tablet TAKE 1 TABLET(75 MG) BY MOUTH DAILY 90 tablet 1   EPINEPHrine 0.3 mg/0.3 mL IJ SOAJ injection Inject 0.3 mg into the muscle as needed for anaphylaxis. 1 each 1   fexofenadine (ALLEGRA) 180 MG tablet Take 180 mg by mouth as needed for allergies or rhinitis. otc     lamoTRIgine (LAMICTAL) 200 MG tablet Take 200 mg by mouth 2 (two) times daily.     latanoprost (XALATAN) 0.005 % ophthalmic solution Place 1 drop into both eyes at bedtime.     losartan (COZAAR) 100 MG tablet Take 1 tablet (100 mg total) by mouth daily. 90 tablet 0   nitroGLYCERIN (NITROSTAT) 0.4 MG SL tablet PLACE 1 TABLET UNDER THE TONGUE EVERY 5 MINUTES FOR 3 DOSES AS NEEDED FOR CHEST PAIN 25 tablet 3   spironolactone (ALDACTONE) 25 MG tablet TAKE 1 TABLET(25 MG) BY MOUTH DAILY 90 tablet 0   No current facility-administered medications for this visit.    Allergies:   Patient has no known allergies.   Social History:  The patient  reports that he has quit smoking. He has never used smokeless tobacco. He reports current alcohol use. He reports that he does not use drugs.   Family History:   family history includes CAD in his father; Heart failure in his mother; Lung cancer in his father.   Review of Systems: Review of Systems  Constitutional: Negative.   Respiratory:  Positive for shortness of breath.   Cardiovascular: Negative.   Gastrointestinal: Negative.   Musculoskeletal: Negative.   Neurological: Negative.   Psychiatric/Behavioral: Negative.    All other systems reviewed and are negative.  PHYSICAL EXAM: VS:  BP 113/60 (BP Location: Left Arm,  Patient Position: Sitting, Cuff Size: Normal)   Pulse (!) 51   Ht '5\' 9"'$  (1.753 m)   Wt 209 lb 6 oz (95 kg)   SpO2 98%   BMI 30.92 kg/m  , BMI Body mass index is 30.92 kg/m. Constitutional:  oriented to person, place, and time. No distress.  HENT:  Head: Grossly normal Eyes:  no discharge. No scleral icterus.  Neck: No JVD, no carotid bruits  Cardiovascular: Regular rate and rhythm, no murmurs appreciated Pulmonary/Chest: Clear to auscultation bilaterally, no wheezes or rails Abdominal: Soft.  no distension.  no tenderness.  Musculoskeletal: Normal range of motion Neurological:  normal muscle tone. Coordination normal. No atrophy Skin: Skin warm and dry, ecchymotic bruising bilateral arms Psychiatric: normal affect, pleasant    Recent Labs: 07/15/2021: ALT 17 08/01/2021: BUN 20; Creatinine, Ser 1.14; Hemoglobin 13.3; Platelets 292; Potassium 5.1; Sodium 133    Lipid Panel Lab Results  Component Value Date   CHOL 103 11/29/2021   HDL 56 11/29/2021   LDLCALC 31 11/29/2021   TRIG 81 11/29/2021    Wt Readings from Last 3 Encounters:  02/27/22 209 lb 6 oz (95 kg)  11/29/21 213 lb (96.6 kg)  08/09/21 203 lb (92.1 kg)     ASSESSMENT AND PLAN:  Essential hypertension  Orthostatic in the morning, recommend he decrease Coreg down to 6.25 twice daily Losartan 50 twice daily not all in the morning Continue spironolactone Call us for continued symptoms  CAD, multiple vessel Moderate to severe left circumflex disease tortuous vessel, occluded RCA  stable angina symptoms,  Chronic shortness of breath, Prior catheterization November 2018 Occluded RCA collaterals from left to right Also 60% proximal to mid left circumflex disease Cholesterol at goal, nondiabetic, non-smoker Given extensive bruising, we will continue aspirin and hold the Plavix  Mixed hyperlipidemia Cholesterol is at goal on the current lipid regimen. No changes to the medications were  made.  Seizures Previous hospital admissions discussed with him from July 2019 No recent seizures Needs refills from Dr. Melrose Nakayama   Total encounter time more than 30 minutes  Greater than 50% was spent in counseling and coordination of care with the patient  No orders of the defined types were placed in this encounter.    Signed, Esmond Plants, M.D., Ph.D. 02/27/2022  [place], [place]

## 2022-02-28 NOTE — Addendum Note (Signed)
Addended by: Anselm Pancoast on: 02/28/2022 11:13 AM   Modules accepted: Orders

## 2022-05-01 ENCOUNTER — Ambulatory Visit: Payer: Self-pay | Admitting: *Deleted

## 2022-05-01 NOTE — Telephone Encounter (Signed)
Called pt and left VM informing he needs appt for hernia check. Told him his scheduled appt will need to be separate from the hernia check per Baxter Flattery. Asked pt to call back to make appt.  - Deone Omahoney

## 2022-05-01 NOTE — Telephone Encounter (Signed)
  Chief Complaint: Hernia Symptoms: George Doyle has had umbilical hernia x 2 years. States is "Bulging more, especially after eating,  and painful  when I hold something against it." Frequency: Worsening past 6-8 months Pertinent Negatives: Patient denies :does not radiate Disposition: '[]'$ ED /'[]'$ Urgent Care (no appt availability in office) / '[]'$ Appointment(In office/virtual)/ '[]'$  [place] [facility]/ '[]'$ Home Care/ '[x]'$ Refused Recommended Disposition /'[]'$ [place] [facility]/ '[x]'$  Follow-up with PCP Additional Notes: Attempted to secure appt, George Doyle would like referral to surgeon. States "I don't know what she could do other than press on it." Advised would route to practice for PCPs review and final disposition. Care advise provided, verbalizes understanding.  Reason for Disposition  [1] MODERATE pain (e.g., interferes with normal activities) AND [2] pain comes and goes (cramps) AND [3] present > 24 hours  (Exception: Pain with Vomiting or Diarrhea - see that Guideline.)  Answer Assessment - Initial Assessment Questions 1. LOCATION: "Where does it hurt?"      Umbilicus. Hernia, had for 2 years 2. RADIATION: "Does the pain shoot anywhere else?" (e.g., chest, back)     No 3. ONSET: "When did the pain begin?" (Minutes, hours or days ago)       4. SUDDEN: "Gradual or sudden onset?"     Gradual 5. PATTERN "Does the pain come and go, or is it constant?"    - If it comes and goes: "How long does it last?" "Do you have pain now?"     (Note: Comes and goes means the pain is intermittent. It goes away completely between bouts.)    - If constant: "Is it getting better, staying the same, or getting worse?"      (Note: Constant means the pain never goes away completely; most serious pain is constant and gets worse.)      Bulging larger than it is 6-8 months ago 6. SEVERITY: "How bad is the pain?"  (e.g., Scale 1-10; mild, moderate, or severe)    - MILD (1-3): Doesn't interfere with normal activities, abdomen soft and  not tender to touch.     - MODERATE (4-7): Interferes with normal activities or awakens from sleep, abdomen tender to touch.     - SEVERE (8-10): Excruciating pain, doubled over, unable to do any normal activities.       2/10 but holding something against me 8-9/10 7. RECURRENT SYMPTOM: "Have you ever had this type of stomach pain before?" If Yes, ask: "When was the last time?" and "What happened that time?"       8. CAUSE: "What do you think is causing the stomach pain?"      9. RELIEVING/AGGRAVATING FACTORS: "What makes it better or worse?" (e.g., antacids, bending or twisting motion, bowel movement)     Hurts when something touches it. 10. OTHER SYMPTOMS: "Do you have any other symptoms?" (e.g., back pain, diarrhea, fever, urination pain, vomiting)  Protocols used: Abdominal Pain - Male-A-AH

## 2022-05-02 ENCOUNTER — Ambulatory Visit: Payer: Medicare HMO | Admitting: Cardiovascular Disease

## 2022-05-03 ENCOUNTER — Ambulatory Visit (INDEPENDENT_AMBULATORY_CARE_PROVIDER_SITE_OTHER): Payer: Medicare HMO | Admitting: Family Medicine

## 2022-05-03 ENCOUNTER — Encounter: Payer: Self-pay | Admitting: Family Medicine

## 2022-05-03 VITALS — BP 136/80 | HR 60 | Ht 69.0 in | Wt 209.0 lb

## 2022-05-03 DIAGNOSIS — E785 Hyperlipidemia, unspecified: Secondary | ICD-10-CM

## 2022-05-03 DIAGNOSIS — K429 Umbilical hernia without obstruction or gangrene: Secondary | ICD-10-CM | POA: Diagnosis not present

## 2022-05-03 MED ORDER — ATORVASTATIN CALCIUM 80 MG PO TABS: ORAL_TABLET | ORAL | 1 refills | Status: DC

## 2022-05-03 NOTE — Progress Notes (Signed)
Date:  05/03/2022   Name:  George Doyle   DOB:  December 06, 1950   MRN:  [id number]   Chief Complaint: Hyperlipidemia and Hernia (Had for 2 years- painful when holding something against it)  Patient is a 71 year old male who presents for a umbillical hernia repair exam. The patient reports the following problems: hernia. Health maintenance has been reviewed up to date.    Hyperlipidemia This is a chronic problem. The current episode started more than 1 year ago. The problem is controlled. Recent lipid tests were reviewed and are normal. Pertinent negatives include no chest pain, myalgias or shortness of breath.    Lab Results  Component Value Date   NA 133 (L) 08/01/2021   K 5.1 08/01/2021   CO2 25 08/01/2021   GLUCOSE 87 08/01/2021   BUN 20 08/01/2021   CREATININE 1.14 08/01/2021   CALCIUM 9.5 08/01/2021   EGFR 68 08/01/2021   GFRNONAA 53 (L) 07/15/2021   Lab Results  Component Value Date   CHOL 103 11/29/2021   HDL 56 11/29/2021   LDLCALC 31 11/29/2021   TRIG 81 11/29/2021   CHOLHDL 2.1 06/11/2018   No results found for: "TSH" No results found for: "HGBA1C" Lab Results  Component Value Date   WBC 8.3 08/01/2021   HGB 13.3 08/01/2021   HCT 39.4 08/01/2021   MCV 90 08/01/2021   PLT 292 08/01/2021   Lab Results  Component Value Date   ALT 17 07/15/2021   AST 24 07/15/2021   ALKPHOS 69 07/15/2021   BILITOT 1.3 (H) 07/15/2021   No results found for: "25OHVITD2", "25OHVITD3", "VD25OH"   Review of Systems  Constitutional:  Negative for chills and fever.  HENT:  Negative for drooling, ear discharge, ear pain and sore throat.   Respiratory:  Negative for cough, shortness of breath and wheezing.   Cardiovascular:  Negative for chest pain, palpitations and leg swelling.  Gastrointestinal:  Negative for abdominal pain, blood in stool, constipation, diarrhea and nausea.  Endocrine: Negative for polydipsia.  Genitourinary:  Negative for dysuria, frequency,  hematuria and urgency.  Musculoskeletal:  Negative for back pain, myalgias and neck pain.  Skin:  Negative for rash.  Allergic/Immunologic: Negative for environmental allergies.  Neurological:  Negative for dizziness and headaches.  Hematological:  Does not bruise/bleed easily.  Psychiatric/Behavioral:  Negative for suicidal ideas. The patient is not nervous/anxious.     Patient Active Problem List   Diagnosis Date Noted   Benign neoplasm of skin of left forearm 01/27/2019   Pure hypercholesterolemia 06/21/2018   Poorly-controlled hypertension 06/21/2018   Seizure disorder ([place]) 06/21/2018   Hill-Sachs lesion of right shoulder 06/19/2018   Injury of tendon of long head of right biceps 06/19/2018   Rotator cuff tendinitis, right 06/19/2018   Traumatic complete tear of right rotator cuff 06/19/2018   Headache disorder 05/09/2018   CAD (coronary artery disease), native coronary artery 03/28/2018   History of seizures 03/28/2018   Subdural hematoma ([place]) 03/27/2018   Vasovagal syncope 03/27/2018   Old MI (myocardial infarction) 08/30/2017   CAD, multiple vessel 08/01/2017   NSTEMI (non-ST elevated myocardial infarction) ([place]) 07/20/2017   Hyperlipidemia 05/10/2017   Preop cardiovascular exam    Benign neoplasm of ascending colon    Polyp of sigmoid colon    Essential hypertension 03/03/2016    No Known Allergies  Past Surgical History:  Procedure Laterality Date   BICEPT TENODESIS Right 07/18/2018   Procedure: BICEPS TENODESIS;  Surgeon: Milagros Evener  J, MD;  Location: [facility];  Service: Orthopedics;  Laterality: Right;   COLONOSCOPY  2011   cleared for 10 yrs   COLONOSCOPY WITH PROPOFOL N/A 11/06/2016   Procedure: COLONOSCOPY WITH PROPOFOL;  Surgeon: [name], MD;  Location: [facility];  Service: Endoscopy;  Laterality: N/A;   CORONARY STENT INTERVENTION N/A 07/23/2017   Procedure: CORONARY STENT INTERVENTION;  Surgeon: Yolonda Kida, MD;  Location: [place] [facility];  Service: Cardiovascular;  Laterality: N/A;   CYST EXCISION     Forearm   HERNIA REPAIR     KNEE SURGERY Bilateral    2 on R) 1 on L)   LEFT HEART CATH AND CORONARY ANGIOGRAPHY N/A 07/23/2017   Procedure: LEFT HEART CATH AND CORONARY ANGIOGRAPHY;  Surgeon: Minna Merritts, MD;  Location: [place] [facility];  Service: Cardiovascular;  Laterality: N/A;   POLYPECTOMY  11/06/2016   Procedure: POLYPECTOMY;  Surgeon: [name], MD;  Location: [facility];  Service: Endoscopy;;   SHOULDER ARTHROSCOPY WITH OPEN ROTATOR CUFF REPAIR Right 07/18/2018   Procedure: SHOULDER ARTHROSCOPY WITH OPEN ROTATOR CUFF REPAIR;  Surgeon: Corky Mull, MD;  Location: [facility];  Service: Orthopedics;  Laterality: Right;    Social History   Tobacco Use   Smoking status: Former   Smokeless tobacco: Never   Tobacco comments:    quit over 20 yrs ago  Vaping Use   Vaping Use: Never used  Substance Use Topics   Alcohol use: Yes    Alcohol/week: 0.0 standard drinks of alcohol    Comment: 1-2 drinks/year   Drug use: No     Medication list has been reviewed and updated.  Current Meds  Medication Sig   acetaminophen (TYLENOL) 500 MG tablet Take 500 mg by mouth at bedtime. As needed/ otc   aspirin EC 81 MG EC tablet Take 1 tablet (81 mg total) daily by mouth.   atorvastatin (LIPITOR) 80 MG tablet TAKE 1 TABLET(80 MG) BY MOUTH DAILY AT 6 PM   carvedilol (COREG) 6.25 MG tablet Take 2 tablets (12.5 mg total) by mouth 2 (two) times daily.   citalopram (CELEXA) 20 MG tablet Take 20 mg by mouth daily.   EPINEPHrine 0.3 mg/0.3 mL IJ SOAJ injection Inject 0.3 mg into the muscle as needed for anaphylaxis.   fexofenadine (ALLEGRA) 180 MG tablet Take 180 mg by mouth as needed for allergies or rhinitis. otc   lamoTRIgine (LAMICTAL) 200 MG tablet Take 200 mg by mouth 2 (two) times daily.   latanoprost (XALATAN) 0.005 % ophthalmic solution Place 1 drop into both eyes at bedtime.   losartan  (COZAAR) 50 MG tablet Take 1 tablet (50 mg total) by mouth 2 (two) times daily.   nitroGLYCERIN (NITROSTAT) 0.4 MG SL tablet PLACE 1 TABLET UNDER THE TONGUE EVERY 5 MINUTES FOR 3 DOSES AS NEEDED FOR CHEST PAIN   spironolactone (ALDACTONE) 25 MG tablet TAKE 1 TABLET(25 MG) BY MOUTH DAILY       05/03/2022   11:36 AM 11/29/2021    1:24 PM 08/01/2021    1:49 PM 04/06/2021    9:52 AM  GAD 7 : Generalized Anxiety Score  Nervous, Anxious, on Edge 0 1 1 0  Control/stop worrying 0 0 0 0  Worry too much - different things 0 0 0 0  Trouble relaxing 0 0 0 0  Restless 0 0 0 0  Easily annoyed or irritable 0 0 0 0  Afraid - awful might happen 0 0  0 0  Total GAD 7 Score 0 1 1 0  Anxiety Difficulty Not difficult at all Not difficult at all         05/03/2022   11:36 AM 11/29/2021    1:24 PM 11/14/2021    2:12 PM  Depression screen PHQ 2/9  Decreased Interest 0 0 0  Down, Depressed, Hopeless 0 0 0  PHQ - 2 Score 0 0 0  Altered sleeping 0 0 0  Tired, decreased energy 0 0 0  Change in appetite 0 0 0  Feeling bad or failure about yourself  0 0 0  Trouble concentrating 0 0 0  Moving slowly or fidgety/restless 0 0 0  Suicidal thoughts 0 0 0  PHQ-9 Score 0 0 0  Difficult doing work/chores Not difficult at all Not difficult at all Not difficult at all    BP Readings from Last 3 Encounters:  05/03/22 136/80  02/27/22 113/60  11/29/21 130/70    Physical Exam Vitals and nursing note reviewed.  HENT:     Head: Normocephalic.     Right Ear: External ear normal.     Left Ear: External ear normal.     Nose: Nose normal.  Eyes:     General: No scleral icterus.       Right eye: No discharge.        Left eye: No discharge.     Conjunctiva/sclera: Conjunctivae normal.     Pupils: Pupils are equal, round, and reactive to light.  Neck:     Thyroid: No thyromegaly.     Vascular: No JVD.     Trachea: No tracheal deviation.  Cardiovascular:     Rate and Rhythm: Normal rate and regular rhythm.      Heart sounds: Normal heart sounds. No murmur heard.    No friction rub. No gallop.  Pulmonary:     Effort: No respiratory distress.     Breath sounds: Normal breath sounds. No wheezing or rales.  Abdominal:     General: Bowel sounds are normal.     Palpations: Abdomen is soft. There is no mass.     Tenderness: There is no abdominal tenderness. There is no guarding or rebound.  Musculoskeletal:        General: No tenderness. Normal range of motion.     Cervical back: Normal range of motion and neck supple.  Lymphadenopathy:     Cervical: No cervical adenopathy.  Skin:    General: Skin is warm.     Findings: No rash.  Neurological:     Cranial Nerves: No cranial nerve deficit.     Wt Readings from Last 3 Encounters:  05/03/22 209 lb (94.8 kg)  02/27/22 209 lb 6 oz (95 kg)  11/29/21 213 lb (96.6 kg)    BP 136/80   Pulse 60   Ht '5\' 9"'  (1.753 m)   Wt 209 lb (94.8 kg)   BMI 30.86 kg/m   Assessment and Plan: 1. Hyperlipidemia, unspecified hyperlipidemia type Chronic.  Controlled.  Stable.  Will check lipid panel and likely continue on current dosing of atorvastatin 80 mg once a day.  We will check comprehensive metabolic panel for AST ALT - Lipid Panel With LDL/HDL Ratio - Comprehensive Metabolic Panel (CMET) - atorvastatin (LIPITOR) 80 MG tablet; TAKE 1 TABLET(80 MG) BY MOUTH DAILY AT 6 PM  Dispense: 90 tablet; Refill: 1  2. Umbilical hernia without obstruction and without gangrene New onset.  Waxing and waning appearance but reducible.  However  it is significantly painful with manipulation and this is beginning to affect everyday activities such as wearing a seatbelt or if he hits up against anything we will refer to general surgery for evaluation. - Ambulatory referral to [place], MD

## 2022-05-04 LAB — COMPREHENSIVE METABOLIC PANEL
ALT: 10 IU/L (ref 0–44)
AST: 18 IU/L (ref 0–40)
Albumin/Globulin Ratio: 2.4 — ABNORMAL HIGH (ref 1.2–2.2)
Albumin: 4.4 g/dL (ref 3.8–4.8)
Alkaline Phosphatase: 90 IU/L (ref 44–121)
BUN/Creatinine Ratio: 14 (ref 10–24)
BUN: 17 mg/dL (ref 8–27)
Bilirubin Total: 0.8 mg/dL (ref 0.0–1.2)
CO2: 22 mmol/L (ref 20–29)
Calcium: 9.6 mg/dL (ref 8.6–10.2)
Chloride: 97 mmol/L (ref 96–106)
Creatinine, Ser: 1.2 mg/dL (ref 0.76–1.27)
Globulin, Total: 1.8 g/dL (ref 1.5–4.5)
Glucose: 110 mg/dL — ABNORMAL HIGH (ref 70–99)
Potassium: 4.5 mmol/L (ref 3.5–5.2)
Sodium: 134 mmol/L (ref 134–144)
Total Protein: 6.2 g/dL (ref 6.0–8.5)
eGFR: 64 mL/min/{1.73_m2} (ref >59)

## 2022-05-04 LAB — LIPID PANEL WITH LDL/HDL RATIO
Cholesterol, Total: 101 mg/dL (ref 100–199)
HDL: 58 mg/dL (ref >39)
LDL Chol Calc (NIH): 29 mg/dL (ref 0–99)
LDL/HDL Ratio: 0.5 ratio (ref 0.0–3.6)
Triglycerides: 65 mg/dL (ref 0–149)
VLDL Cholesterol Cal: 14 mg/dL (ref 5–40)

## 2022-05-10 ENCOUNTER — Ambulatory Visit: Payer: Medicare HMO | Admitting: Surgery

## 2022-05-10 ENCOUNTER — Telehealth: Payer: Self-pay | Admitting: Cardiovascular Disease

## 2022-05-10 ENCOUNTER — Other Ambulatory Visit: Payer: Self-pay

## 2022-05-10 ENCOUNTER — Encounter: Payer: Self-pay | Admitting: Surgery

## 2022-05-10 ENCOUNTER — Telehealth: Payer: Self-pay

## 2022-05-10 VITALS — BP 157/79 | HR 52 | Temp 98.7°F | Ht 69.0 in | Wt 209.8 lb

## 2022-05-10 DIAGNOSIS — K429 Umbilical hernia without obstruction or gangrene: Secondary | ICD-10-CM | POA: Diagnosis not present

## 2022-05-10 NOTE — Telephone Encounter (Signed)
   Pre-operative Risk Assessment    Patient Name: George Doyle  DOB: October 23, 1950 MRN: [id number]      Request for Surgical Clearance    Procedure:   umbilical hernia repair  Date of Surgery:  Clearance TBD                                 Surgeon:  Dr Olean Ree Surgeon's Group or Practice Name:  [facility] Phone number:  [phone] Fax number:  [phone]   Type of Clearance Requested:   - Medical    Type of Anesthesia:  Not Indicated   Additional requests/questions:    [name]   05/10/2022, 4:08 PM

## 2022-05-10 NOTE — Telephone Encounter (Signed)
No request were made for anticoagulant or antiplatelet therapy hold.  ASA not prescribed by cardiology.  Preoperative team, please contact this patient and set up a phone call appointment for further cardiac evaluation.  Thank you for your help.  Jossie Ng. Avaiah Stempel NP-C    05/10/2022, [facility] [place] [address] Office [phone] Fax [phone]

## 2022-05-10 NOTE — Telephone Encounter (Signed)
Neurology clearance faxed to Dr. Gurney Maxin.  Cardiac clearance faxed to [facility].

## 2022-05-10 NOTE — Progress Notes (Signed)
05/10/2022  Reason for Visit: Umbilical hernia  Requesting Provider: Otilio Miu, MD  History of Present Illness: George Doyle is a 71 y.o. male presenting for evaluation of an umbilical hernia.  The patient reports that he's had this for about 2 years, but has noticed that it's been causing more discomfort over the past couple of months.  He reports that the hernia bulges more and it hurts more when it bulges.  He also reports that if he bumps that area against something like a chair or the lawnmower, it also hurts.  Denies any episode of unbearable pain.  His wife reports that sometimes she has noticed the overlying skin being more purple / irritated in color.  Denies any nausea or vomiting, constipation or diarrhea.  Of note, he has a history of a prior laparoscopic left inguinal hernia repair.  He had a CT scan of abdomen/pelvis for kidney stone in 06/2021 which noted the umbilical hernia and also bilateral inguinal hernias.  The patient denies any discomfort in either groin and denies any bulging sensation either.  Also, he has history of MI in 2018 and also a history of seizures.  He had a fall due to a seizure which resulted in a subdural hematoma.  He is currently on Aspirin and is being followed by Dr. Rockey Situ with cardiology and Dr. Melrose Nakayama with neurology.  Past Medical History: Past Medical History:  Diagnosis Date   Allergy    Arthritis    knees, hands   Hypertension    Myocardial infarction ([place]) 07/2017   Pneumonia    Seizures ([place])    approx 1970's, 2005, June 2019  Testing showed no cause. Extended EEG scheduled for November 2019     Past Surgical History: Past Surgical History:  Procedure Laterality Date   BICEPT TENODESIS Right 07/18/2018   Procedure: BICEPS TENODESIS;  Surgeon: Corky Mull, MD;  Location: [facility];  Service: Orthopedics;  Laterality: Right;   COLONOSCOPY  2011   cleared for 10 yrs   COLONOSCOPY WITH PROPOFOL N/A 11/06/2016    Procedure: COLONOSCOPY WITH PROPOFOL;  Surgeon: [name], MD;  Location: [place];  Service: Endoscopy;  Laterality: N/A;   CORONARY STENT INTERVENTION N/A 07/23/2017   Procedure: CORONARY STENT INTERVENTION;  Surgeon: Yolonda Kida, MD;  Location: [place] [facility];  Service: Cardiovascular;  Laterality: N/A;   CYST EXCISION     Forearm   HERNIA REPAIR     KNEE SURGERY Bilateral    2 on R) 1 on L)   LEFT HEART CATH AND CORONARY ANGIOGRAPHY N/A 07/23/2017   Procedure: LEFT HEART CATH AND CORONARY ANGIOGRAPHY;  Surgeon: Minna Merritts, MD;  Location: [place] [facility];  Service: Cardiovascular;  Laterality: N/A;   POLYPECTOMY  11/06/2016   Procedure: POLYPECTOMY;  Surgeon: [name], MD;  Location: [place];  Service: Endoscopy;;   SHOULDER ARTHROSCOPY WITH OPEN ROTATOR CUFF REPAIR Right 07/18/2018   Procedure: SHOULDER ARTHROSCOPY WITH OPEN ROTATOR CUFF REPAIR;  Surgeon: Corky Mull, MD;  Location: [facility];  Service: Orthopedics;  Laterality: Right;    Home Medications: Prior to Admission medications   Medication Sig Start Date End Date Taking? Authorizing Provider  acetaminophen (TYLENOL) 500 MG tablet Take 500 mg by mouth at bedtime. As needed/ otc   Yes [provider]  aspirin EC 81 MG EC tablet Take 1 tablet (81 mg total) daily by mouth. 07/24/17  Yes Gladstone Lighter, MD  atorvastatin (LIPITOR) 80 MG  tablet TAKE 1 TABLET(80 MG) BY MOUTH DAILY AT 6 PM 05/03/22  Yes Juline Patch, MD  carvedilol (COREG) 6.25 MG tablet Take 2 tablets (12.5 mg total) by mouth 2 (two) times daily. 02/27/22  Yes Gollan, Kathlene November, MD  EPINEPHrine 0.3 mg/0.3 mL IJ SOAJ injection Inject 0.3 mg into the muscle as needed for anaphylaxis. 08/01/21  Yes Juline Patch, MD  fexofenadine (ALLEGRA) 180 MG tablet Take 180 mg by mouth as needed for allergies or rhinitis. otc   Yes [provider]  lamoTRIgine (LAMICTAL) 200 MG tablet Take 200 mg by mouth 2  (two) times daily.   Yes [provider]  latanoprost (XALATAN) 0.005 % ophthalmic solution Place 1 drop into both eyes at bedtime. 11/05/21  Yes [provider]  losartan (COZAAR) 50 MG tablet Take 1 tablet (50 mg total) by mouth 2 (two) times daily. 02/27/22  Yes Gollan, Kathlene November, MD  nitroGLYCERIN (NITROSTAT) 0.4 MG SL tablet PLACE 1 TABLET UNDER THE TONGUE EVERY 5 MINUTES FOR 3 DOSES AS NEEDED FOR CHEST PAIN 04/18/21  Yes Gollan, Kathlene November, MD  spironolactone (ALDACTONE) 25 MG tablet TAKE 1 TABLET(25 MG) BY MOUTH DAILY 02/17/22  Yes Gollan, Kathlene November, MD    Allergies: No Known Allergies  Social History:  reports that he has quit smoking. He has never used smokeless tobacco. He reports current alcohol use. He reports that he does not use drugs.   Family History: Family History  Problem Relation Age of Onset   CAD Father    Lung cancer Father    Heart failure Mother     Review of Systems: Review of Systems  Constitutional:  Negative for chills and fever.  HENT:  Negative for hearing loss.   Respiratory:  Negative for shortness of breath.   Cardiovascular:  Negative for chest pain.  Gastrointestinal:  Positive for abdominal pain. Negative for nausea and vomiting.  Genitourinary:  Negative for dysuria.  Musculoskeletal:  Negative for myalgias.  Skin:  Negative for rash.  Neurological:  Negative for dizziness.  Psychiatric/Behavioral:  Negative for depression.     Physical Exam BP (!) 157/79   Pulse (!) 52   Temp 98.7 F (37.1 C) (Oral)   Ht '5\' 9"'$  (1.753 m)   Wt 209 lb 12.8 oz (95.2 kg)   SpO2 96%   BMI 30.98 kg/m  CONSTITUTIONAL: No acute distress, well-nourished HEENT:  Normocephalic, atraumatic, extraocular motion intact. NECK: Trachea is midline, and there is no jugular venous distension.  RESPIRATORY:  Lungs are clear, and breath sounds are equal bilaterally. Normal respiratory effort without pathologic use of accessory muscles. CARDIOVASCULAR: Heart  is regular without murmurs, gallops, or rubs. GI: The abdomen is soft, nondistended, with mild tenderness to palpation at the umbilicus.  The umbilicus has a partially reducible hernia defect measuring about 2 cm with some irritation to the overlying skin.  No evidence of strangulation.  On evaluation of both inguinal regions, the patient does not have any evidence of a recurrent left inguinal hernia or a new right inguinal hernia both on coughing and straining.   MUSCULOSKELETAL:  Normal muscle strength and tone in all four extremities.  No peripheral edema or cyanosis. SKIN: Skin turgor is normal. There are no pathologic skin lesions.  NEUROLOGIC:  Motor and sensation is grossly normal.  Cranial nerves are grossly intact. PSYCH:  Alert and oriented to person, place and time. Affect is normal.  Laboratory Analysis: Labs on 05/03/2022: Sodium 134, potassium 4.5, chloride  97, CO2 22, BUN 17, creatinine 1.2.  Total bilirubin 0.8, AST 18, ALT 10, alkaline phosphatase 90, albumin 4.4.  Imaging: CT renal stone on 07/15/2021: IMPRESSION: Bilateral nephrolithiasis. Mild left hydroureteronephrosis is noted with perinephric stranding secondary to 4 mm calculus in distal left ureter.   Mild bilateral fat containing inguinal hernias are noted. Small fat containing periumbilical hernia.   Aortic Atherosclerosis (ICD10-I70.0).  Assessment and Plan: This is a 71 y.o. male with an umbilical hernia.  -Discussed with the patient that indeed he has an umbilical hernia, and it is symptomatic.  There is some tenderness when trying to reduce it, but it is mostly reducible.  There is some mild overlying skin change, but currently no evidence of strangulation.  On exam, however, there is no evidence of either a recurrent left inguinal hernia or a new right inguinal hernia, and the patient is asymptomatic in those areas. --Discussed with him that we can repair the umbilical hernia via minimally invasive approach.   Discussed with him the role for a robotic assisted umbilical hernia repair.  Reviewed with him the surgery at length including the risks of bleeding, infection, injury to surrounding structures, that this is an outpatient procedure, post-operative activity restrictions, pain control, and he's willing to proceed. --Will schedule him for surgery on 05/25/22.  Will send for cardiology clearance and neurology clearance.  If possible to stop his Aspirin, would have it as last dose on 05/19/22. --All of his questions have been answered.  I spent 55 minutes dedicated to the care of this patient on the date of this encounter to include pre-visit review of records, face-to-face time with the patient discussing diagnosis and management, and any post-visit coordination of care.   Melvyn Neth, [facility]

## 2022-05-10 NOTE — H&P (View-Only) (Signed)
05/10/2022  Reason for Visit: Umbilical hernia  Requesting Provider: Otilio Miu, MD  History of Present Illness: George Doyle is a 71 y.o. male presenting for evaluation of an umbilical hernia.  The patient reports that he's had this for about 2 years, but has noticed that it's been causing more discomfort over the past couple of months.  He reports that the hernia bulges more and it hurts more when it bulges.  He also reports that if he bumps that area against something like a chair or the lawnmower, it also hurts.  Denies any episode of unbearable pain.  His wife reports that sometimes she has noticed the overlying skin being more purple / irritated in color.  Denies any nausea or vomiting, constipation or diarrhea.  Of note, he has a history of a prior laparoscopic left inguinal hernia repair.  He had a CT scan of abdomen/pelvis for kidney stone in 06/2021 which noted the umbilical hernia and also bilateral inguinal hernias.  The patient denies any discomfort in either groin and denies any bulging sensation either.  Also, he has history of MI in 2018 and also a history of seizures.  He had a fall due to a seizure which resulted in a subdural hematoma.  He is currently on Aspirin and is being followed by Dr. Rockey Situ with cardiology and Dr. Melrose Nakayama with neurology.  Past Medical History: Past Medical History:  Diagnosis Date   Allergy    Arthritis    knees, hands   Hypertension    Myocardial infarction ([place]) 07/2017   Pneumonia    Seizures ([place])    approx 1970's, 2005, June 2019  Testing showed no cause. Extended EEG scheduled for November 2019     Past Surgical History: Past Surgical History:  Procedure Laterality Date   BICEPT TENODESIS Right 07/18/2018   Procedure: BICEPS TENODESIS;  Surgeon: Corky Mull, MD;  Location: [facility];  Service: Orthopedics;  Laterality: Right;   COLONOSCOPY  2011   cleared for 10 yrs   COLONOSCOPY WITH PROPOFOL N/A 11/06/2016    Procedure: COLONOSCOPY WITH PROPOFOL;  Surgeon: [name], MD;  Location: [place];  Service: Endoscopy;  Laterality: N/A;   CORONARY STENT INTERVENTION N/A 07/23/2017   Procedure: CORONARY STENT INTERVENTION;  Surgeon: Yolonda Kida, MD;  Location: [place] [facility];  Service: Cardiovascular;  Laterality: N/A;   CYST EXCISION     Forearm   HERNIA REPAIR     KNEE SURGERY Bilateral    2 on R) 1 on L)   LEFT HEART CATH AND CORONARY ANGIOGRAPHY N/A 07/23/2017   Procedure: LEFT HEART CATH AND CORONARY ANGIOGRAPHY;  Surgeon: Minna Merritts, MD;  Location: [place] [facility];  Service: Cardiovascular;  Laterality: N/A;   POLYPECTOMY  11/06/2016   Procedure: POLYPECTOMY;  Surgeon: [name], MD;  Location: [place];  Service: Endoscopy;;   SHOULDER ARTHROSCOPY WITH OPEN ROTATOR CUFF REPAIR Right 07/18/2018   Procedure: SHOULDER ARTHROSCOPY WITH OPEN ROTATOR CUFF REPAIR;  Surgeon: Corky Mull, MD;  Location: [facility];  Service: Orthopedics;  Laterality: Right;    Home Medications: Prior to Admission medications   Medication Sig Start Date End Date Taking? Authorizing Provider  acetaminophen (TYLENOL) 500 MG tablet Take 500 mg by mouth at bedtime. As needed/ otc   Yes [provider]  aspirin EC 81 MG EC tablet Take 1 tablet (81 mg total) daily by mouth. 07/24/17  Yes Gladstone Lighter, MD  atorvastatin (LIPITOR) 80 MG  tablet TAKE 1 TABLET(80 MG) BY MOUTH DAILY AT 6 PM 05/03/22  Yes Juline Patch, MD  carvedilol (COREG) 6.25 MG tablet Take 2 tablets (12.5 mg total) by mouth 2 (two) times daily. 02/27/22  Yes Gollan, Kathlene November, MD  EPINEPHrine 0.3 mg/0.3 mL IJ SOAJ injection Inject 0.3 mg into the muscle as needed for anaphylaxis. 08/01/21  Yes Juline Patch, MD  fexofenadine (ALLEGRA) 180 MG tablet Take 180 mg by mouth as needed for allergies or rhinitis. otc   Yes [provider]  lamoTRIgine (LAMICTAL) 200 MG tablet Take 200 mg by mouth 2  (two) times daily.   Yes [provider]  latanoprost (XALATAN) 0.005 % ophthalmic solution Place 1 drop into both eyes at bedtime. 11/05/21  Yes [provider]  losartan (COZAAR) 50 MG tablet Take 1 tablet (50 mg total) by mouth 2 (two) times daily. 02/27/22  Yes Gollan, Kathlene November, MD  nitroGLYCERIN (NITROSTAT) 0.4 MG SL tablet PLACE 1 TABLET UNDER THE TONGUE EVERY 5 MINUTES FOR 3 DOSES AS NEEDED FOR CHEST PAIN 04/18/21  Yes Gollan, Kathlene November, MD  spironolactone (ALDACTONE) 25 MG tablet TAKE 1 TABLET(25 MG) BY MOUTH DAILY 02/17/22  Yes Gollan, Kathlene November, MD    Allergies: No Known Allergies  Social History:  reports that he has quit smoking. He has never used smokeless tobacco. He reports current alcohol use. He reports that he does not use drugs.   Family History: Family History  Problem Relation Age of Onset   CAD Father    Lung cancer Father    Heart failure Mother     Review of Systems: Review of Systems  Constitutional:  Negative for chills and fever.  HENT:  Negative for hearing loss.   Respiratory:  Negative for shortness of breath.   Cardiovascular:  Negative for chest pain.  Gastrointestinal:  Positive for abdominal pain. Negative for nausea and vomiting.  Genitourinary:  Negative for dysuria.  Musculoskeletal:  Negative for myalgias.  Skin:  Negative for rash.  Neurological:  Negative for dizziness.  Psychiatric/Behavioral:  Negative for depression.     Physical Exam BP (!) 157/79   Pulse (!) 52   Temp 98.7 F (37.1 C) (Oral)   Ht '5\' 9"'$  (1.753 m)   Wt 209 lb 12.8 oz (95.2 kg)   SpO2 96%   BMI 30.98 kg/m  CONSTITUTIONAL: No acute distress, well-nourished HEENT:  Normocephalic, atraumatic, extraocular motion intact. NECK: Trachea is midline, and there is no jugular venous distension.  RESPIRATORY:  Lungs are clear, and breath sounds are equal bilaterally. Normal respiratory effort without pathologic use of accessory muscles. CARDIOVASCULAR: Heart  is regular without murmurs, gallops, or rubs. GI: The abdomen is soft, nondistended, with mild tenderness to palpation at the umbilicus.  The umbilicus has a partially reducible hernia defect measuring about 2 cm with some irritation to the overlying skin.  No evidence of strangulation.  On evaluation of both inguinal regions, the patient does not have any evidence of a recurrent left inguinal hernia or a new right inguinal hernia both on coughing and straining.   MUSCULOSKELETAL:  Normal muscle strength and tone in all four extremities.  No peripheral edema or cyanosis. SKIN: Skin turgor is normal. There are no pathologic skin lesions.  NEUROLOGIC:  Motor and sensation is grossly normal.  Cranial nerves are grossly intact. PSYCH:  Alert and oriented to person, place and time. Affect is normal.  Laboratory Analysis: Labs on 05/03/2022: Sodium 134, potassium 4.5, chloride  97, CO2 22, BUN 17, creatinine 1.2.  Total bilirubin 0.8, AST 18, ALT 10, alkaline phosphatase 90, albumin 4.4.  Imaging: CT renal stone on 07/15/2021: IMPRESSION: Bilateral nephrolithiasis. Mild left hydroureteronephrosis is noted with perinephric stranding secondary to 4 mm calculus in distal left ureter.   Mild bilateral fat containing inguinal hernias are noted. Small fat containing periumbilical hernia.   Aortic Atherosclerosis (ICD10-I70.0).  Assessment and Plan: This is a 71 y.o. male with an umbilical hernia.  -Discussed with the patient that indeed he has an umbilical hernia, and it is symptomatic.  There is some tenderness when trying to reduce it, but it is mostly reducible.  There is some mild overlying skin change, but currently no evidence of strangulation.  On exam, however, there is no evidence of either a recurrent left inguinal hernia or a new right inguinal hernia, and the patient is asymptomatic in those areas. --Discussed with him that we can repair the umbilical hernia via minimally invasive approach.   Discussed with him the role for a robotic assisted umbilical hernia repair.  Reviewed with him the surgery at length including the risks of bleeding, infection, injury to surrounding structures, that this is an outpatient procedure, post-operative activity restrictions, pain control, and he's willing to proceed. --Will schedule him for surgery on 05/25/22.  Will send for cardiology clearance and neurology clearance.  If possible to stop his Aspirin, would have it as last dose on 05/19/22. --All of his questions have been answered.  I spent 55 minutes dedicated to the care of this patient on the date of this encounter to include pre-visit review of records, face-to-face time with the patient discussing diagnosis and management, and any post-visit coordination of care.   Melvyn Neth, [facility]

## 2022-05-10 NOTE — Patient Instructions (Addendum)
Last dose of Aspirin 05/19/2022.  Neurology and cardiac clearances have been faxed to these providers, please call their office to see if you need to schedule ana appointment.    Our surgery scheduler will call you within 24-48 hours to schedule your surgery. Please have the [place] surgery sheet available when speaking with her.    Umbilical Hernia, Adult  A hernia is a bulge of tissue that pushes through an opening between muscles. An umbilical hernia happens in the abdomen, near the belly button (umbilicus). The hernia may contain tissues from the small intestine, large intestine, or fatty tissue covering the intestines. Umbilical hernias in adults tend to get worse over time, and they require surgical treatment. There are different types of umbilical hernias, including: Indirect hernia. This type is located just above or below the umbilicus. It is the most common type of umbilical hernia in adults. Direct hernia. This type forms through an opening formed by the umbilicus. Reducible hernia. This type of hernia comes and goes. It may be visible only when you strain, lift something heavy, or cough. This type of hernia can be pushed back into the abdomen (reduced). Incarcerated hernia. This type traps abdominal tissue inside the hernia. This type of hernia cannot be reduced. Strangulated hernia. This type of hernia cuts off blood flow to the tissues inside the hernia. The tissues can start to die if this happens. This type of hernia requires emergency treatment. What are the causes? An umbilical hernia happens when tissue inside the abdomen presses on a weak area of the abdominal muscles. What increases the risk? You may have a greater risk of this condition if you: Are obese. Have had several pregnancies. Have a buildup of fluid inside your abdomen. Have had surgery that weakens the abdominal muscles. What are the signs or symptoms? The main symptom of this condition is a painless bulge at or  near the belly button. A reducible hernia may be visible only when you strain, lift something heavy, or cough. Other symptoms may include: Dull pain. A feeling of pressure. Symptoms of a strangulated hernia may include: Pain that gets increasingly worse. Nausea and vomiting. Pain when pressing on the hernia. Skin over the hernia becoming red or purple. Constipation. Blood in the stool. How is this diagnosed? This condition may be diagnosed based on: A physical exam. You may be asked to cough or strain while standing. These actions increase the pressure inside your abdomen and can force the hernia through the opening in your muscles. Your health care provider may try to reduce the hernia by pressing on it. Your symptoms and medical history. How is this treated? Surgery is the only treatment for an umbilical hernia. Surgery for a strangulated hernia is done as soon as possible. If you have a small hernia that is not incarcerated, you may need to lose weight before having surgery. Follow these instructions at home: Lose weight, if told by your health care provider. Do not try to push the hernia back in. Watch your hernia for any changes in color or size. Tell your health care provider if any changes occur. You may need to avoid activities that increase pressure on your hernia. Do not lift anything that is heavier than 10 lb (4.5 kg), or the limit that you are told, until your health care provider says that it is safe. Take over-the-counter and prescription medicines only as told by your health care provider. Keep all follow-up visits. This is important. Contact a health care  provider if: Your hernia gets larger. Your hernia becomes painful. Get help right away if: You develop sudden, severe pain near the area of your hernia. You have pain as well as nausea or vomiting. You have pain and the skin over your hernia changes color. You develop a fever or chills. Summary A hernia is a bulge  of tissue that pushes through an opening between muscles. An umbilical hernia happens near the belly button. Surgery is the only treatment for an umbilical hernia. Do not try to push your hernia back in. Keep all follow-up visits. This is important. This information is not intended to replace advice given to you by your health care provider. Make sure you discuss any questions you have with your health care provider. Document Revised: 04/12/2020 Document Reviewed: 04/12/2020 Elsevier Patient Education  [place].

## 2022-05-11 ENCOUNTER — Telehealth: Payer: Self-pay | Admitting: *Deleted

## 2022-05-11 ENCOUNTER — Telehealth: Payer: Self-pay | Admitting: Surgery

## 2022-05-11 NOTE — Telephone Encounter (Signed)
Patient calls back, they are now informed of all dates of surgery and verbalized understanding.

## 2022-05-11 NOTE — Telephone Encounter (Signed)
Outgoing call, left message for patient to call. Please inform him of the following regarding scheduled surgery.    Pre-Admission date/time, and Surgery date.  Surgery Date: 05/25/22 Preadmission Testing Date: 05/18/22 (phone 8a-1p)  Also patient will need to call at [phone], between 1-3:00pm the day before surgery, to find out what time to arrive for surgery.

## 2022-05-11 NOTE — Telephone Encounter (Signed)
Pt agreeable to plan of care for tele pre op appt 05/16/22 @ 10 am. Med rec and consent are done.      Patient Consent for Virtual Visit        George Doyle has provided verbal consent on 05/11/2022 for a virtual visit (video or telephone).   CONSENT FOR VIRTUAL VISIT FOR:  George Doyle  By participating in this virtual visit I agree to the following:  I hereby voluntarily request, consent and authorize [facility] and its employed or contracted physicians, physician assistants, nurse practitioners or other licensed health care professionals (the Practitioner), to provide me with telemedicine health care services (the "Services") as deemed necessary by the treating Practitioner. I acknowledge and consent to receive the Services by the Practitioner via telemedicine. I understand that the telemedicine visit will involve communicating with the Practitioner through live audiovisual communication technology and the disclosure of certain medical information by electronic transmission. I acknowledge that I have been given the opportunity to request an in-person assessment or other available alternative prior to the telemedicine visit and am voluntarily participating in the telemedicine visit.  I understand that I have the right to withhold or withdraw my consent to the use of telemedicine in the course of my care at any time, without affecting my right to future care or treatment, and that the Practitioner or I may terminate the telemedicine visit at any time. I understand that I have the right to inspect all information obtained and/or recorded in the course of the telemedicine visit and may receive copies of available information for a reasonable fee.  I understand that some of the potential risks of receiving the Services via telemedicine include:  Delay or interruption in medical evaluation due to technological equipment failure or disruption; Information transmitted may not  be sufficient (e.g. poor resolution of images) to allow for appropriate medical decision making by the Practitioner; and/or  In rare instances, security protocols could fail, causing a breach of personal health information.  Furthermore, I acknowledge that it is my responsibility to provide information about my medical history, conditions and care that is complete and accurate to the best of my ability. I acknowledge that Practitioner's advice, recommendations, and/or decision may be based on factors not within their control, such as incomplete or inaccurate data provided by me or distortions of diagnostic images or specimens that may result from electronic transmissions. I understand that the practice of medicine is not an exact science and that Practitioner makes no warranties or guarantees regarding treatment outcomes. I acknowledge that a copy of this consent can be made available to me via my patient portal ([facility]), or I can request a printed copy by calling the office of [facility].    I understand that my insurance will be billed for this visit.   I have read or had this consent read to me. I understand the contents of this consent, which adequately explains the benefits and risks of the Services being provided via telemedicine.  I have been provided ample opportunity to ask questions regarding this consent and the Services and have had my questions answered to my satisfaction. I give my informed consent for the services to be provided through the use of telemedicine in my medical care

## 2022-05-11 NOTE — Telephone Encounter (Signed)
Pt agreeable to plan of care for tele pre op appt 05/16/22 @ 10 am. Med rec and consent are done.   Pt tells me that Dr. Rockey Situ does cover his ASA. He states that Dr. Rockey Situ told him to stay on ASA after he took him off Plavix

## 2022-05-15 NOTE — Telephone Encounter (Signed)
Neurology clearance received -isk-patienty optimized for surgery.

## 2022-05-16 ENCOUNTER — Ambulatory Visit: Payer: Medicare HMO | Attending: Cardiology | Admitting: Physician Assistant

## 2022-05-16 DIAGNOSIS — Z01810 Encounter for preprocedural cardiovascular examination: Secondary | ICD-10-CM

## 2022-05-16 NOTE — Progress Notes (Signed)
Virtual Visit via Telephone Note   Because of George Doyle's co-morbid illnesses, he is at least at moderate risk for complications without adequate follow up.  This format is felt to be most appropriate for this patient at this time.  The patient did not have access to video technology/had technical difficulties with video requiring transitioning to audio format only (telephone).  All issues noted in this document were discussed and addressed.  No physical exam could be performed with this format.  Please refer to the patient's chart for his consent to telehealth for [facility].  Evaluation Performed:  Preoperative cardiovascular risk assessment _____________   Date:  05/16/2022   Patient ID:  George Doyle, DOB 01/02/1951, MRN [id number] Patient Location:  Home Provider location:   Office  Primary Care Provider:  Juline Patch, MD Primary Cardiologist:  Ida Rogue, MD  Chief Complaint / Patient Profile   71 y.o. y/o male with a h/o  Coronary artery disease NSTEMI in 07/2017 s/p attempted PCI of RCA-unsuccessful; med Rx Cath 07/2017: LCx proximal 60; RCA proximal 100 CTO (L-R collaterals); RPAV 99 Echo 07/21/2017: EF 50-55, inferior HK, G1 DD, mild MR, normal RVSF, mildly dilated aortic root (3.6 cm); ascending aorta 4.1 cm Hypertension Hyperlipidemia Seizure disorder Hx subdural hematoma Ex smoker Orthostatic hypotension Arthritis Mildly dilated aortic root and ascending aorta Echo 2018: Root 3.6 cm, ascending aorta 4.1 cm  who is pending  Procedure:   umbilical hernia repair Date of Surgery:  Clearance 05/25/22 Surgeon:  Dr Olean Ree Surgeon's Group or Practice Name:   [facility] Type of Clearance Requested:   - Medical  Type of Anesthesia:  Not Indicated  He presents today for telephonic preoperative cardiovascular risk assessment.  Past Medical History    Past Medical History:  Diagnosis Date   Allergy     Arthritis    knees, hands   Hypertension    Myocardial infarction ([place]) 07/2017   Pneumonia    Seizures ([place])    approx 1970's, 2005, June 2019  Testing showed no cause. Extended EEG scheduled for November 2019   Past Surgical History:  Procedure Laterality Date   BICEPT TENODESIS Right 07/18/2018   Procedure: BICEPS TENODESIS;  Surgeon: Corky Mull, MD;  Location: [facility];  Service: Orthopedics;  Laterality: Right;   COLONOSCOPY  2011   cleared for 10 yrs   COLONOSCOPY WITH PROPOFOL N/A 11/06/2016   Procedure: COLONOSCOPY WITH PROPOFOL;  Surgeon: [name], MD;  Location: [facility];  Service: Endoscopy;  Laterality: N/A;   CORONARY STENT INTERVENTION N/A 07/23/2017   Procedure: CORONARY STENT INTERVENTION;  Surgeon: Yolonda Kida, MD;  Location: [facility];  Service: Cardiovascular;  Laterality: N/A;   CYST EXCISION     Forearm   HERNIA REPAIR     KNEE SURGERY Bilateral    2 on R) 1 on L)   LEFT HEART CATH AND CORONARY ANGIOGRAPHY N/A 07/23/2017   Procedure: LEFT HEART CATH AND CORONARY ANGIOGRAPHY;  Surgeon: Minna Merritts, MD;  Location: [facility];  Service: Cardiovascular;  Laterality: N/A;   POLYPECTOMY  11/06/2016   Procedure: POLYPECTOMY;  Surgeon: [name], MD;  Location: [facility];  Service: Endoscopy;;   SHOULDER ARTHROSCOPY WITH OPEN ROTATOR CUFF REPAIR Right 07/18/2018   Procedure: SHOULDER ARTHROSCOPY WITH OPEN ROTATOR CUFF REPAIR;  Surgeon: Corky Mull, MD;  Location: [facility];  Service: Orthopedics;  Laterality: Right;    Allergies  No  Known Allergies  History of Present Illness    George Doyle is a 71 y.o. male who presents via audio/video conferencing for a telehealth visit today.  Pt was last seen in cardiology clinic on 02/27/2022 by her [place].  At that time George Doyle was doing well.  The patient is now pending procedure as outlined above. Since his last visit, he has done well  without chest pain, syncope, orthopnea, leg edema.  He has chronic shortness of breath without change.   Home Medications    Prior to Admission medications   Medication Sig Start Date End Date Taking? Authorizing Provider  acetaminophen (TYLENOL) 500 MG tablet Take 500 mg by mouth at bedtime. As needed/ otc    [provider]  aspirin EC 81 MG EC tablet Take 1 tablet (81 mg total) daily by mouth. 07/24/17   Gladstone Lighter, MD  atorvastatin (LIPITOR) 80 MG tablet TAKE 1 TABLET(80 MG) BY MOUTH DAILY AT 6 PM 05/03/22   Juline Patch, MD  carvedilol (COREG) 6.25 MG tablet Take 2 tablets (12.5 mg total) by mouth 2 (two) times daily. 02/27/22   Minna Merritts, MD  EPINEPHrine 0.3 mg/0.3 mL IJ SOAJ injection Inject 0.3 mg into the muscle as needed for anaphylaxis. 08/01/21   Juline Patch, MD  fexofenadine (ALLEGRA) 180 MG tablet Take 180 mg by mouth as needed for allergies or rhinitis. otc    [provider]  lamoTRIgine (LAMICTAL) 200 MG tablet Take 200 mg by mouth 2 (two) times daily.    [provider]  latanoprost (XALATAN) 0.005 % ophthalmic solution Place 1 drop into both eyes at bedtime. 11/05/21   [provider]  losartan (COZAAR) 50 MG tablet Take 1 tablet (50 mg total) by mouth 2 (two) times daily. 02/27/22   Minna Merritts, MD  nitroGLYCERIN (NITROSTAT) 0.4 MG SL tablet PLACE 1 TABLET UNDER THE TONGUE EVERY 5 MINUTES FOR 3 DOSES AS NEEDED FOR CHEST PAIN 04/18/21   Minna Merritts, MD  spironolactone (ALDACTONE) 25 MG tablet TAKE 1 TABLET(25 MG) BY MOUTH DAILY 02/17/22   Minna Merritts, MD    Physical Exam    Vital Signs:  George Doyle does not have vital signs available for review today.  Given telephonic nature of communication, physical exam is limited. AAOx3. NAD. Normal affect.  Speech and respirations are unlabored.  Accessory Clinical Findings    None  Assessment & Plan    1.  Preoperative Cardiovascular Risk  Assessment:    George Doyle perioperative risk of a major cardiac event is 0.9% according to the Revised Cardiac Risk Index (RCRI).  Therefore, he is at low risk for perioperative complications.   His functional capacity is good at 4.3 METs according to the Duke Activity Status Index (DASI). Recommendations: According to ACC/AHA guidelines, no further cardiovascular testing needed.  The patient may proceed to surgery at acceptable risk.   Antiplatelet and/or Anticoagulation Recommendations: Ideally, we would recommend continuing aspirin without interruption.  However, if the bleeding risk is too great, aspirin can be held for 7 days prior to his surgery.  Please resume Aspirin post operatively when it is felt to be safe from a bleeding standpoint.     A copy of this note will be routed to requesting surgeon.  Time:   Today, I have spent 6 minutes with the patient with telehealth technology discussing medical history, symptoms, and management plan.     Richardson Dopp, PA-C  05/16/2022,  3:12 PM

## 2022-05-17 NOTE — Progress Notes (Unsigned)
Cardiac Clearance has been received from Dr Donivan Scull office. The patient is cleared at Low risk. He may hold his Aspirin for 7 days prior to surgery. All notes are in Epic.

## 2022-05-18 ENCOUNTER — Encounter
Admission: RE | Admit: 2022-05-18 | Discharge: 2022-05-18 | Disposition: A | Discharge status: Home / Self Care | Payer: Medicare HMO | Source: Ambulatory Visit | Attending: Surgery | Admitting: Surgery

## 2022-05-18 HISTORY — DX: Dyspnea, unspecified: R06.00

## 2022-05-18 HISTORY — DX: Atherosclerotic heart disease of native coronary artery without angina pectoris: I25.10

## 2022-05-18 NOTE — Patient Instructions (Signed)
Your procedure is scheduled on: 05/25/22 Report to [facility]. To find out your arrival time please call [phone] between 1PM - 3PM on 05/24/22.  Remember: Instructions that are not followed completely may result in serious medical risk, up to and including death, or upon the discretion of your surgeon and anesthesiologist your surgery may need to be rescheduled.     _X__ 1. Do not eat food or drink any liquids after midnight the night before your procedure.                 No gum chewing or hard candies.   __X__2.  On the morning of surgery brush your teeth with toothpaste and water, you                 may rinse your mouth with mouthwash if you wish.  Do not swallow any              toothpaste of mouthwash.     _X__ 3.  No Alcohol for 24 hours before or after surgery.   _X__ 4.  Do Not Smoke or use e-cigarettes For 24 Hours Prior to Your Surgery.                 Do not use any chewable tobacco products for at least 6 hours prior to                 surgery.  ____  5.  Bring all medications with you on the day of surgery if instructed.   __X__  6.  Notify your doctor if there is any change in your medical condition      (cold, fever, infections).     Do not wear jewelry, make-up, hairpins, clips or nail polish. Do not wear lotions, powders, or perfumes/cologne. You may wear deodorant.  Do not shave body hair 48 hours prior to surgery. Men may shave face and neck. Do not bring valuables to the hospital.    [facility] is not responsible for any belongings or valuables.  Contacts, dentures/partials or body piercings may not be worn into surgery. Bring a case for your contacts, glasses or hearing aids, a denture cup will be supplied. Leave your suitcase in the car. After surgery it may be brought to your room. For patients admitted to the hospital, discharge time is determined by your treatment team.   Patients discharged the day  of surgery will not be allowed to drive home.    __X__ Take these medicines the morning of surgery with A SIP OF WATER:    1. carvedilol (COREG) 6.25 MG tablet  2. lamoTRIgine (LAMICTAL) 200 MG tablet  3. fexofenadine (ALLEGRA) 180 MG tablet if needed  4.  5.  6.  ____ Fleet Enema (as directed)   ____ Use CHG Soap/SAGE wipes as directed  ____ Use inhalers on the day of surgery  ____ Stop metformin/Janumet/Farxiga 2 days prior to surgery    ____ Take 1/2 of usual insulin dose the night before surgery. No insulin the morning          of surgery.   ____ Stop Blood Thinners Coumadin/Plavix/Xarelto/Pleta/Pradaxa/Eliquis/Effient/Aspirin  on   Or contact your Surgeon, Cardiologist or Medical Doctor regarding  ability to stop your blood thinners  __X__ Stop Anti-inflammatories 7 days before surgery such as Advil, Ibuprofen, Motrin,  BC or Goodies Powder, Naprosyn, Naproxen, Aleve  You may continue your Tylenol as needed  __X__ Stop all herbals and supplements, fish oil or vitamins for 1 week until after surgery.    ____ Bring C-Pap to the hospital.    Continue Aspirin daily except on the day of surgery (per Dr Donivan Scull recommendations). Dr Hampton Abbot is aware.

## 2022-05-23 ENCOUNTER — Encounter: Payer: Self-pay | Admitting: Surgery

## 2022-05-23 NOTE — Progress Notes (Signed)
Perioperative Services  Pre-Admission/Anesthesia Testing Clinical Review  Date: 05/23/22  Patient Demographics:  Name: George Doyle DOB:   04/13/1951 MRN:   [id number]  Planned Surgical Procedure(s):    Case: [id number] Date/Time: 05/25/22 0945   Procedure: XI ROBOT ASSISTED UMBILICAL HERNIA REPAIR   Anesthesia type: General   Pre-op diagnosis: Umbilical hernia reducible less 3 cm K42.9   Location: [facility] OR ROOM 04 / [place] [facility]S FOR ANESTHESIA GROUP   Surgeons: Olean Ree, MD   NOTE: Available PAT nursing documentation and vital signs have been reviewed. Clinical nursing staff has updated patient's PMH/PSHx, current medication list, and drug allergies/intolerances to ensure comprehensive history available to assist in medical decision making as it pertains to the aforementioned surgical procedure and anticipated anesthetic course. Extensive review of available clinical information performed. Kay PMH and PSHx updated with any diagnoses/procedures that  may have been inadvertently omitted during his intake with the pre-admission testing department's nursing staff.  Clinical Discussion:  George Doyle is a 71 y.o. male who is submitted for pre-surgical anesthesia review and clearance prior to him undergoing the above procedure. Patient is a Former Research scientist (life sciences). Pertinent PMH includes: CAD, NSTEMI x 2, diastolic dysfunction, ascending aortic dilatation, vasovagal syncope, HTN, HLD, dyspnea, OA, seizures.  Patient is followed by cardiology Rockey Situ, MD). He was last seen in the cardiology clinic on 02/27/2022; notes reviewed.  At the time of his clinic visit, patient doing well overall from a cardiovascular perspective.  He denied any episodes of chest pain, shortness of breath, PND, orthopnea, palpitations, significant peripheral edema, or presyncope/syncope.  Patient with vertiginous symptoms related to position changes and the early morning hours.  Patient sustained  mechanical fall recently while trying to travel outside.  Patient with a past medical history significant for cardiovascular diagnoses.  Patient diagnosed with having an NSTEMI on 07/20/2017.  Of note, patient started with episodes of chest pain 5 days prior to being seen in the ED (approximately 07/15/2017).  Diagnostic left heart catheterization was performed on 07/23/2017 revealing multivessel CAD; 100% proximal to distal RCA, 60% proximal to mid LCx, and 99% posterior interventricular artery.  PCI was attempted, however unsuccessful.  MD noted complexity due to degree of stenosis and tortuosity of anatomy.  Patient was referred to interventional list in [facility] for consideration of PCI.  Patient was seen by Dr. Irish Lack on 08/30/2017.  Case was reviewed including recent cardiac catheterization report.  Patient asymptomatic during his consult visit.  Intervention was ultimately deferred opting for medical management.  Last TTE performed on 07/21/2017 following patient's NSTEMI.  Study revealed a low normal left ventricular systolic function with an EF of 50-55%.  Inferior hypokinesis. Diastolic Doppler parameters consistent with abnormal relaxation (G1DD).  There was mild mitral valve regurgitation noted.  There was no evidence of significant transvalvular gradient to suggest stenosis.  Ascending aorta noted to be dilated; aortic root 36 mm and ascending aorta 41 mm.  Patient suffered a second NSTEMI on 10/23/2020.  Troponins were trended: 307 --> 1304 --> 6774 --> 6895, repeat cardiac catheterization was performed revealing a normal left ventricular systolic function with an EF of >55%.  There was 40% stenosis of the proximal LCx and no CTO of the proximal RCA with good collateral flow.  Again, intervention was deferred opting for medical management.  Given findings on heart catheterizations, patient remains on daily DAPT therapy (ASA + clopidogrel).  He was reportedly compliant with therapy with no  evidence of GI bleeding.  Patient did report  significant bruising on the medication. Blood pressure well controlled at 113/60 on currently prescribed beta-blocker (carvedilol), ARB (losartan), and diuretic (spironolactone) therapies.  Patient is on atorvastatin for his HLD diagnosis and further ASCVD prevention.  He has a supply of short acting nitrates (NTG) to use on a as needed basis; denied recent use.  Patient is nondiabetic.  He does not have an OSAH diagnosis.  Patient making attempts to remain active, however he is limited by arthritides and balance issues.  He has no formal exercise regimen.  With that being said, functional capacity per the DASI indicates that patient is able to achieve at least 4 METS of activity without experiencing any type of angina/anginal equivalent symptoms.  In response to his vertiginous symptoms, Coreg dose was changed twice daily.  Plavix was discontinued.  No other changes were made to his medication regimen.  Patient to follow-up with outpatient cardiology in 1 year or sooner if needed.  George Doyle is scheduled for an elective XI ROBOT ASSISTED UMBILICAL HERNIA REPAIR on 05/25/2022 with Dr. Ardath Sax, MD.  Given patient's past medical history significant for cardiovascular diagnoses, presurgical cardiac clearance was sought by the PAT team. Per cardiology, "based ACC/AHA guidelines, the patient's past medical history, and the amount of time since his last clinic visit, this patient would be at an overall ACCEPTABLE risk for the planned procedure without further cardiovascular testing or intervention at this time".  In review of his medication reconciliation, it is noted the patient is on daily antiplatelet therapy.  He has been instructed on recommendations from his cardiologist for holding his daily low-dose ASA for 7 days prior to his procedure with plans to restart as soon as postoperative bleeding risk felt to be minimized by his primary attending  surgeon.  Patient is aware that his last dose of ASA should be on 05/17/2022.  Patient denies previous perioperative complications with anesthesia in the past. In review of the available records, it is noted that patient underwent a general anesthetic course at [facility] (ASA III) in 03/2019 without documented complications.      05/18/2022   10:48 AM 05/10/2022   11:10 AM 05/03/2022   11:34 AM  Vitals with BMI  Height '5\' 10"'  '5\' 9"'  '5\' 9"'   Weight 203 lbs 209 lbs 13 oz 209 lbs  BMI 29.13 84.66 59.93  Systolic  570 177  Diastolic  79 80  Pulse  52 60    Providers/Specialists:   NOTE: Primary physician provider listed below. Patient may have been seen by APP or partner within same practice.   PROVIDER ROLE / SPECIALTY LAST Delight Stare, MD General Surgery (Surgeon) 05/10/2022  Juline Patch, MD Primary Care Provider 05/03/2022  Ida Rogue, MD Cardiology 02/27/2022  Gurney Maxin, MD Neurology 06/21/2021   Allergies:  Patient has no known allergies.  Current Home Medications:   No current facility-administered medications for this encounter.    acetaminophen (TYLENOL) 500 MG tablet   aspirin EC 81 MG EC tablet   atorvastatin (LIPITOR) 80 MG tablet   carvedilol (COREG) 6.25 MG tablet   citalopram (CELEXA) 20 MG tablet   EPINEPHrine 0.3 mg/0.3 mL IJ SOAJ injection   fexofenadine (ALLEGRA) 180 MG tablet   lamoTRIgine (LAMICTAL) 200 MG tablet   latanoprost (XALATAN) 0.005 % ophthalmic solution   losartan (COZAAR) 50 MG tablet   nitroGLYCERIN (NITROSTAT) 0.4 MG SL tablet   spironolactone (ALDACTONE) 25 MG tablet   History:   Past Medical  History:  Diagnosis Date   Allergy    Arthritis    knees, hands   Ascending aorta dilatation ([place]) 07/21/2017   a.) TTE 07/21/2017: Ao root 36 mm, asc Ao 41 mm; b.) TTE 10/22/2020: asc Ao 39 mm   Coronary artery disease    Diastolic dysfunction 67/67/2094   a.) TTE 07/21/2017 (following NSTEMI): EF  50-55%, inf HK, mild MR, G1DD   Dyspnea    Former tobacco use    History of 2019 novel coronavirus disease (COVID-19) 10/21/2020   HLD (hyperlipidemia)    Hypertension    NSTEMI (non-ST elevated myocardial infarction) ([place]) 07/20/2017   a.) CP started 5 days prior to presentation (approx 07/15/2017); b.) LHC 07/23/2017: 100% p-d RCA, 60% p-mLCx (tortuous), 99% post atrio --> unsuccessful PCI; c.) referred to interventionist Irish Lack) in [place] on 08/30/2017 --> med mgmt recommended   NSTEMI (non-ST elevated myocardial infarction) ([place]) 10/23/2020   a.) Troponins trended: 307-->1304--> 6774 --> 6895 --> 5886 --> 5324; b.) LHC 10/23/2020: EF >55%; 40% pLCx, CTO pRCA --> med mgmt.   Pneumonia    SDH (subdural hematoma) ([facility]) 03/27/2018   a.) 6 mm frontal SDH s/p fall related to tonic-clonic seizure   Seizures ([place]) 1970   a.) last documented seizure was in 70/9628   Umbilical hernia    Vasovagal syncope    Past Surgical History:  Procedure Laterality Date   BICEPT TENODESIS Right 07/18/2018   Procedure: BICEPS TENODESIS;  Surgeon: Corky Mull, MD;  Location: [facility] ORS;  Service: Orthopedics;  Laterality: Right;   COLONOSCOPY  2011   cleared for 10 yrs   COLONOSCOPY WITH PROPOFOL N/A 11/06/2016   Procedure: COLONOSCOPY WITH PROPOFOL;  Surgeon: [name], MD;  Location: [place];  Service: Endoscopy;  Laterality: N/A;   CORONARY STENT INTERVENTION N/A 07/23/2017   Procedure: CORONARY STENT INTERVENTION;  Surgeon: Yolonda Kida, MD;  Location: [place] [facility];  Service: Cardiovascular;  Laterality: N/A;   CYST EXCISION     Forearm   INGUINAL HERNIA REPAIR Left    KNEE SURGERY Bilateral    2 on R) 1 on L)   LEFT HEART CATH AND CORONARY ANGIOGRAPHY N/A 07/23/2017   Procedure: LEFT HEART CATH AND CORONARY ANGIOGRAPHY;  Surgeon: Minna Merritts, MD;  Location: [place] [facility];  Service: Cardiovascular;  Laterality: N/A;   LEFT HEART CATH AND CORONARY ANGIOGRAPHY  Left 10/23/2020   Procedure: LEFT HEART CATH AND CORONARY ANGIOGRAPHY; Location: [facility]   POLYPECTOMY  11/06/2016   Procedure: POLYPECTOMY;  Surgeon: [name], MD;  Location: [place];  Service: Endoscopy;;   SHOULDER ARTHROSCOPY WITH OPEN ROTATOR CUFF REPAIR Right 07/18/2018   Procedure: SHOULDER ARTHROSCOPY WITH OPEN ROTATOR CUFF REPAIR;  Surgeon: Corky Mull, MD;  Location: [facility] ORS;  Service: Orthopedics;  Laterality: Right;   Family History  Problem Relation Age of Onset   CAD Father    Lung cancer Father    Heart failure Mother    Social History   Tobacco Use   Smoking status: Former    Types: Cigarettes   Smokeless tobacco: Never   Tobacco comments:    quit over 20 yrs ago  Vaping Use   Vaping Use: Never used  Substance Use Topics   Alcohol use: Yes    Alcohol/week: 0.0 standard drinks of alcohol    Comment: 1-2 drinks/year   Drug use: No    Pertinent Clinical Results:  LABS: Labs reviewed: Acceptable for surgery.  No visits  with results within 3 Day(s) from this visit.  Latest known visit with results is:  Office Visit on 05/03/2022  Component Date Value Ref Range Status   Cholesterol, Total 05/03/2022 101  100 - 199 mg/dL Final   Triglycerides 05/03/2022 65  0 - 149 mg/dL Final   HDL 05/03/2022 58  >39 mg/dL Final   VLDL Cholesterol Cal 05/03/2022 14  5 - 40 mg/dL Final   LDL Chol Calc (NIH) 05/03/2022 29  0 - 99 mg/dL Final   LDL/HDL Ratio 05/03/2022 0.5  0.0 - 3.6 ratio Final   Comment:                                     LDL/HDL Ratio                                             Men  Women                               1/2 Avg.Risk  1.0    1.5                                   Avg.Risk  3.6    3.2                                2X Avg.Risk  6.2    5.0                                3X Avg.Risk  8.0    6.1    Glucose 05/03/2022 110 (H)  70 - 99 mg/dL Final   BUN 05/03/2022 17  8 - 27 mg/dL Final   Creatinine, Ser 05/03/2022 1.20  0.76 - 1.27  mg/dL Final   eGFR 05/03/2022 64  >59 mL/min/1.73 Final   BUN/Creatinine Ratio 05/03/2022 14  10 - 24 Final   Sodium 05/03/2022 134  134 - 144 mmol/L Final   Potassium 05/03/2022 4.5  3.5 - 5.2 mmol/L Final   Chloride 05/03/2022 97  96 - 106 mmol/L Final   CO2 05/03/2022 22  20 - 29 mmol/L Final   Calcium 05/03/2022 9.6  8.6 - 10.2 mg/dL Final   Total Protein 05/03/2022 6.2  6.0 - 8.5 g/dL Final   Albumin 05/03/2022 4.4  3.8 - 4.8 g/dL Final   Globulin, Total 05/03/2022 1.8  1.5 - 4.5 g/dL Final   Albumin/Globulin Ratio 05/03/2022 2.4 (H)  1.2 - 2.2 Final   Bilirubin Total 05/03/2022 0.8  0.0 - 1.2 mg/dL Final   Alkaline Phosphatase 05/03/2022 90  44 - 121 IU/L Final   AST 05/03/2022 18  0 - 40 IU/L Final   ALT 05/03/2022 10  0 - 44 IU/L Final    ECG: Date: 02/27/2022 Time ECG obtained: 1102 AM Rate: 51 bpm Rhythm:  Sinus bradycardia with first-degree AV block Axis (leads I and aVF): Normal Intervals: PR 252 ms. QRS 96 ms. QTc 405 ms. ST segment and T wave changes: Nonspecific lateral ST and T wave abnormalities.  Evidence of an age undetermined inferior infarct.   Comparison: Similar to previous tracing obtained on 07/18/2021   IMAGING / PROCEDURES: LEFT HEART CATHETERIZATION AND CORONARY ANGIOGRAPHY performed on 10/23/2020 Normal left ventricular systolic function with an EF of >55% Occluded proximal RCA with collaterals from the LAD to feel PDA, PL, and distal RCA, otherwise no obstructive CAD  TRANSTHORACIC ECHOCARDIOGRAM performed on 10/22/2020 Low normal left ventricular systolic function with EF of 50-55% The right ventricle is normal in size Ascending aorta mildly dilated at 39 mm No valvular regurgitation Normal gradients; no valvular stenosis No pericardial effusion  Impression and Plan:  George Doyle has been referred for pre-anesthesia review and clearance prior to him undergoing the planned anesthetic and procedural courses. Available labs,  pertinent testing, and imaging results were personally reviewed by me. This patient has been appropriately cleared by cardiology with an overall ACCEPTABLE risk of significant perioperative cardiovascular complications.  Based on clinical review performed today (05/23/22), barring any significant acute changes in the patient's overall condition, it is anticipated that he will be able to proceed with the planned surgical intervention. Any acute changes in clinical condition may necessitate his procedure being postponed and/or cancelled. Patient will meet with anesthesia team (MD and/or CRNA) on the day of his procedure for preoperative evaluation/assessment. Questions regarding anesthetic course will be fielded at that time.   Pre-surgical instructions were reviewed with the patient during his PAT appointment and questions were fielded by PAT clinical staff. Patient was advised that if any questions or concerns arise prior to his procedure then he should return a call to PAT and/or his surgeon's office to discuss.  Honor Loh, MSN, APRN, FNP-C, CEN [facility]  Peri-operative Services Nurse Practitioner Phone: [phone] Fax: [phone] 05/23/22 1:40 PM  NOTE: This note has been prepared using Dragon dictation software. Despite my best ability to proofread, there is always the potential that unintentional transcriptional errors may still occur from this process.

## 2022-05-25 ENCOUNTER — Ambulatory Visit
Admission: RE | Admit: 2022-05-25 | Discharge: 2022-05-25 | Disposition: A | Discharge status: Home / Self Care | Payer: Medicare HMO | Attending: Surgery | Admitting: Surgery

## 2022-05-25 ENCOUNTER — Encounter
Admission: RE | Disposition: A | Discharge status: Home / Self Care | Payer: Self-pay | Source: Home / Self Care | Attending: Surgery

## 2022-05-25 ENCOUNTER — Ambulatory Visit: Payer: Medicare HMO | Admitting: Urgent Care

## 2022-05-25 ENCOUNTER — Other Ambulatory Visit: Payer: Self-pay

## 2022-05-25 ENCOUNTER — Encounter: Payer: Self-pay | Admitting: Surgery

## 2022-05-25 DIAGNOSIS — Z79899 Other long term (current) drug therapy: Secondary | ICD-10-CM | POA: Insufficient documentation

## 2022-05-25 DIAGNOSIS — K429 Umbilical hernia without obstruction or gangrene: Secondary | ICD-10-CM

## 2022-05-25 DIAGNOSIS — I2510 Atherosclerotic heart disease of native coronary artery without angina pectoris: Secondary | ICD-10-CM | POA: Insufficient documentation

## 2022-05-25 DIAGNOSIS — Z87891 Personal history of nicotine dependence: Secondary | ICD-10-CM | POA: Insufficient documentation

## 2022-05-25 DIAGNOSIS — I252 Old myocardial infarction: Secondary | ICD-10-CM | POA: Diagnosis not present

## 2022-05-25 DIAGNOSIS — Z955 Presence of coronary angioplasty implant and graft: Secondary | ICD-10-CM | POA: Diagnosis not present

## 2022-05-25 DIAGNOSIS — I10 Essential (primary) hypertension: Secondary | ICD-10-CM | POA: Diagnosis not present

## 2022-05-25 DIAGNOSIS — I440 Atrioventricular block, first degree: Secondary | ICD-10-CM | POA: Diagnosis not present

## 2022-05-25 HISTORY — DX: Syncope and collapse: R55

## 2022-05-25 HISTORY — PX: INSERTION OF MESH: SHX5868

## 2022-05-25 HISTORY — DX: Umbilical hernia without obstruction or gangrene: K42.9

## 2022-05-25 HISTORY — DX: Hyperlipidemia, unspecified: E78.5

## 2022-05-25 HISTORY — DX: Personal history of nicotine dependence: Z87.891

## 2022-05-25 SURGERY — REPAIR, HERNIA, UMBILICAL, ROBOT-ASSISTED
Anesthesia: General | Site: Abdomen

## 2022-05-25 MED ORDER — ACETAMINOPHEN 500 MG PO TABS
ORAL_TABLET | ORAL | Status: AC
  Administered 2022-05-25: 1000 mg via ORAL
  Filled 2022-05-25: qty 2

## 2022-05-25 MED ORDER — BUPIVACAINE LIPOSOME 1.3 % IJ SUSP
INTRAMUSCULAR | Status: AC
  Filled 2022-05-25: qty 20

## 2022-05-25 MED ORDER — BUPIVACAINE LIPOSOME 1.3 % IJ SUSP: 20.0000 mL | Freq: Once | INTRAMUSCULAR | Status: DC

## 2022-05-25 MED ORDER — FENTANYL CITRATE (PF) 100 MCG/2ML IJ SOLN
INTRAMUSCULAR | Status: DC | PRN
Start: 2022-05-25 — End: 2022-05-25
  Administered 2022-05-25: 50 ug via INTRAVENOUS

## 2022-05-25 MED ORDER — HYDROMORPHONE HCL 1 MG/ML IJ SOLN
INTRAMUSCULAR | Status: DC | PRN
  Administered 2022-05-25: 1 mg via INTRAVENOUS

## 2022-05-25 MED ORDER — BUPIVACAINE-EPINEPHRINE (PF) 0.5% -1:200000 IJ SOLN
INTRAMUSCULAR | Status: AC
  Filled 2022-05-25: qty 30

## 2022-05-25 MED ORDER — PHENYLEPHRINE 80 MCG/ML (10ML) SYRINGE FOR IV PUSH (FOR BLOOD PRESSURE SUPPORT)
PREFILLED_SYRINGE | INTRAVENOUS | Status: DC | PRN
  Administered 2022-05-25 (×2): 80 ug via INTRAVENOUS

## 2022-05-25 MED ORDER — CHLORHEXIDINE GLUCONATE CLOTH 2 % EX PADS: 6.0000 | MEDICATED_PAD | Freq: Once | CUTANEOUS | Status: DC

## 2022-05-25 MED ORDER — OXYCODONE HCL 5 MG/5ML PO SOLN: 5.0000 mg | Freq: Once | ORAL | Status: DC | PRN

## 2022-05-25 MED ORDER — FAMOTIDINE 20 MG PO TABS: 20.0000 mg | ORAL_TABLET | Freq: Once | ORAL | Status: AC

## 2022-05-25 MED ORDER — ROCURONIUM BROMIDE 100 MG/10ML IV SOLN
INTRAVENOUS | Status: DC | PRN
  Administered 2022-05-25: 50 mg via INTRAVENOUS
  Administered 2022-05-25: 20 mg via INTRAVENOUS

## 2022-05-25 MED ORDER — LACTATED RINGERS IV SOLN: INTRAVENOUS | Status: DC

## 2022-05-25 MED ORDER — FENTANYL CITRATE (PF) 100 MCG/2ML IJ SOLN: 25.0000 ug | INTRAMUSCULAR | Status: DC | PRN

## 2022-05-25 MED ORDER — ACETAMINOPHEN 10 MG/ML IV SOLN: 1000.0000 mg | Freq: Once | INTRAVENOUS | Status: DC | PRN

## 2022-05-25 MED ORDER — HYDROMORPHONE HCL 1 MG/ML IJ SOLN
INTRAMUSCULAR | Status: AC
  Filled 2022-05-25: qty 1

## 2022-05-25 MED ORDER — GABAPENTIN 300 MG PO CAPS
ORAL_CAPSULE | ORAL | Status: AC
  Administered 2022-05-25: 300 mg via ORAL
  Filled 2022-05-25: qty 1

## 2022-05-25 MED ORDER — BUPIVACAINE-EPINEPHRINE 0.5% -1:200000 IJ SOLN
INTRAMUSCULAR | Status: DC | PRN
  Administered 2022-05-25: 50 mL

## 2022-05-25 MED ORDER — DEXAMETHASONE SODIUM PHOSPHATE 10 MG/ML IJ SOLN
INTRAMUSCULAR | Status: DC | PRN
  Administered 2022-05-25: 10 mg via INTRAVENOUS

## 2022-05-25 MED ORDER — PROMETHAZINE HCL 25 MG/ML IJ SOLN: 6.2500 mg | INTRAMUSCULAR | Status: DC | PRN

## 2022-05-25 MED ORDER — CEFAZOLIN SODIUM-DEXTROSE 2-4 GM/100ML-% IV SOLN
INTRAVENOUS | Status: AC
  Filled 2022-05-25: qty 100

## 2022-05-25 MED ORDER — GLYCOPYRROLATE 0.2 MG/ML IJ SOLN
INTRAMUSCULAR | Status: DC | PRN
  Administered 2022-05-25: .2 mg via INTRAVENOUS

## 2022-05-25 MED ORDER — DROPERIDOL 2.5 MG/ML IJ SOLN
0.6250 mg | Freq: Once | INTRAMUSCULAR | Status: DC | PRN
Start: 2022-05-25 — End: 2022-05-25

## 2022-05-25 MED ORDER — IBUPROFEN 600 MG PO TABS: 600.0000 mg | ORAL_TABLET | Freq: Three times a day (TID) | ORAL | 1 refills | Status: DC | PRN

## 2022-05-25 MED ORDER — FENTANYL CITRATE (PF) 100 MCG/2ML IJ SOLN
INTRAMUSCULAR | Status: AC
  Filled 2022-05-25: qty 2

## 2022-05-25 MED ORDER — ACETAMINOPHEN 500 MG PO TABS: 1000.0000 mg | ORAL_TABLET | ORAL | Status: AC

## 2022-05-25 MED ORDER — OXYCODONE HCL 5 MG PO TABS: 5.0000 mg | ORAL_TABLET | Freq: Once | ORAL | Status: DC | PRN

## 2022-05-25 MED ORDER — CEFAZOLIN SODIUM-DEXTROSE 2-4 GM/100ML-% IV SOLN
2.0000 g | INTRAVENOUS | Status: AC
  Administered 2022-05-25: 2 g via INTRAVENOUS

## 2022-05-25 MED ORDER — CHLORHEXIDINE GLUCONATE 0.12 % MT SOLN
OROMUCOSAL | Status: AC
  Administered 2022-05-25: 15 mL via OROMUCOSAL
  Filled 2022-05-25: qty 15

## 2022-05-25 MED ORDER — EPHEDRINE SULFATE (PRESSORS) 50 MG/ML IJ SOLN
INTRAMUSCULAR | Status: DC | PRN
  Administered 2022-05-25: 10 mg via INTRAVENOUS
  Administered 2022-05-25: 5 mg via INTRAVENOUS

## 2022-05-25 MED ORDER — FAMOTIDINE 20 MG PO TABS
ORAL_TABLET | ORAL | Status: AC
  Administered 2022-05-25: 20 mg via ORAL
  Filled 2022-05-25: qty 1

## 2022-05-25 MED ORDER — GABAPENTIN 300 MG PO CAPS: 300.0000 mg | ORAL_CAPSULE | ORAL | Status: AC

## 2022-05-25 MED ORDER — SUGAMMADEX SODIUM 200 MG/2ML IV SOLN
INTRAVENOUS | Status: DC | PRN
  Administered 2022-05-25: 550 mg via INTRAVENOUS

## 2022-05-25 MED ORDER — ORAL CARE MOUTH RINSE: 15.0000 mL | Freq: Once | OROMUCOSAL | Status: AC

## 2022-05-25 MED ORDER — PHENYLEPHRINE HCL-NACL 20-0.9 MG/250ML-% IV SOLN
INTRAVENOUS | Status: DC | PRN
  Administered 2022-05-25: 40 ug/min via INTRAVENOUS

## 2022-05-25 MED ORDER — OXYCODONE HCL 5 MG PO TABS: 5.0000 mg | ORAL_TABLET | ORAL | 0 refills | Status: DC | PRN

## 2022-05-25 MED ORDER — LIDOCAINE HCL (CARDIAC) PF 100 MG/5ML IV SOSY
PREFILLED_SYRINGE | INTRAVENOUS | Status: DC | PRN
  Administered 2022-05-25: 100 mg via INTRAVENOUS

## 2022-05-25 MED ORDER — ACETAMINOPHEN 500 MG PO TABS: 1000.0000 mg | ORAL_TABLET | Freq: Four times a day (QID) | ORAL | Status: AC | PRN

## 2022-05-25 MED ORDER — ONDANSETRON HCL 4 MG/2ML IJ SOLN
INTRAMUSCULAR | Status: DC | PRN
  Administered 2022-05-25 (×2): 4 mg via INTRAVENOUS

## 2022-05-25 MED ORDER — PROPOFOL 10 MG/ML IV BOLUS
INTRAVENOUS | Status: DC | PRN
  Administered 2022-05-25: 180 mg via INTRAVENOUS

## 2022-05-25 MED ORDER — CHLORHEXIDINE GLUCONATE 0.12 % MT SOLN: 15.0000 mL | Freq: Once | OROMUCOSAL | Status: AC

## 2022-05-25 SURGICAL SUPPLY — 53 items
ADH SKN CLS APL DERMABOND .7 (GAUZE/BANDAGES/DRESSINGS) ×2
BLADE SURG SZ11 CARB STEEL (BLADE) ×2 IMPLANT
CANNULA REDUC XI 12-8 STAPL (CANNULA) ×2
CANNULA REDUCER 12-8 DVNC XI (CANNULA) ×2 IMPLANT
COVER TIP SHEARS 8 DVNC (MISCELLANEOUS) ×2 IMPLANT
COVER TIP SHEARS 8MM DA VINCI (MISCELLANEOUS) ×2
COVER WAND RF STERILE (DRAPES) ×2 IMPLANT
DERMABOND ADVANCED (GAUZE/BANDAGES/DRESSINGS) ×2
DERMABOND ADVANCED .7 DNX12 (GAUZE/BANDAGES/DRESSINGS) ×2 IMPLANT
DRAPE ARM DVNC X/XI (DISPOSABLE) ×6 IMPLANT
DRAPE COLUMN DVNC XI (DISPOSABLE) ×2 IMPLANT
DRAPE DA VINCI XI ARM (DISPOSABLE) ×6
DRAPE DA VINCI XI COLUMN (DISPOSABLE) ×2
ELECT CAUTERY BLADE TIP 2.5 (TIP) ×2
ELECT REM PT RETURN 9FT ADLT (ELECTROSURGICAL) ×2
ELECTRODE CAUTERY BLDE TIP 2.5 (TIP) ×2 IMPLANT
ELECTRODE REM PT RTRN 9FT ADLT (ELECTROSURGICAL) ×2 IMPLANT
GLOVE SURG SYN 7.0 (GLOVE) ×8 IMPLANT
GLOVE SURG SYN 7.0 PF PI (GLOVE) ×4 IMPLANT
GLOVE SURG SYN 7.5  E (GLOVE) ×10
GLOVE SURG SYN 7.5 E (GLOVE) ×10 IMPLANT
GLOVE SURG SYN 7.5 PF PI (GLOVE) ×4 IMPLANT
GOWN STRL REUS W/ TWL LRG LVL3 (GOWN DISPOSABLE) ×6 IMPLANT
GOWN STRL REUS W/TWL LRG LVL3 (GOWN DISPOSABLE) ×10
GRASPER SUT TROCAR 14GX15 (MISCELLANEOUS) ×2 IMPLANT
KIT PINK PAD W/HEAD ARE REST (MISCELLANEOUS) ×2
KIT PINK PAD W/HEAD ARM REST (MISCELLANEOUS) ×2 IMPLANT
LABEL OR SOLS (LABEL) ×2 IMPLANT
MESH VENTRALIGHT ST 4.5 ECHO (Mesh General) IMPLANT
NDL INSUFFLATION 14GA 120MM (NEEDLE) ×2 IMPLANT
NEEDLE HYPO 22GX1.5 SAFETY (NEEDLE) ×2 IMPLANT
NEEDLE INSUFFLATION 14GA 120MM (NEEDLE) ×2 IMPLANT
OBTURATOR OPTICAL STANDARD 8MM (TROCAR) ×2
OBTURATOR OPTICAL STND 8 DVNC (TROCAR) ×2
OBTURATOR OPTICALSTD 8 DVNC (TROCAR) ×2 IMPLANT
PACK LAP CHOLECYSTECTOMY (MISCELLANEOUS) ×2 IMPLANT
SEAL CANN UNIV 5-8 DVNC XI (MISCELLANEOUS) ×4 IMPLANT
SEAL XI 5MM-8MM UNIVERSAL (MISCELLANEOUS) ×4
SET TUBE SMOKE EVAC HIGH FLOW (TUBING) ×2 IMPLANT
SOLUTION ELECTROLUBE (MISCELLANEOUS) ×2 IMPLANT
STAPLER CANNULA SEAL DVNC XI (STAPLE) ×2 IMPLANT
STAPLER CANNULA SEAL XI (STAPLE) ×2
SUT MNCRL 4-0 (SUTURE) ×4
SUT MNCRL 4-0 27XMFL (SUTURE) ×4
SUT STRATAFIX PDS 30 CT-1 (SUTURE) ×2 IMPLANT
SUT VIC AB 3-0 SH 27 (SUTURE) ×2
SUT VIC AB 3-0 SH 27X BRD (SUTURE) IMPLANT
SUT VICRYL 0 AB UR-6 (SUTURE) ×4 IMPLANT
SUT VLOC 90 2/L VL 12 GS22 (SUTURE) ×4 IMPLANT
SUTURE MNCRL 4-0 27XMF (SUTURE) ×2 IMPLANT
SYS BAG RETRIEVAL 10MM (BASKET) ×2
SYSTEM BAG RETRIEVAL 10MM (BASKET) IMPLANT
TRAP FLUID SMOKE EVACUATOR (MISCELLANEOUS) ×2 IMPLANT

## 2022-05-25 NOTE — Anesthesia Procedure Notes (Signed)
Procedure Name: Intubation Date/Time: 05/25/2022 12:51 PM  Performed by: Kelton Pillar, CRNAPre-anesthesia Checklist: Patient identified, Emergency Drugs available, Suction available and Patient being monitored Patient Re-evaluated:Patient Re-evaluated prior to induction Oxygen Delivery Method: Circle system utilized Preoxygenation: Pre-oxygenation with 100% oxygen Induction Type: IV induction Ventilation: Mask ventilation without difficulty Laryngoscope Size: McGraph and 3 Grade View: Grade I Tube type: Oral Tube size: 7.0 mm Number of attempts: 1 Airway Equipment and Method: Stylet and Oral airway Placement Confirmation: ETT inserted through vocal cords under direct vision, positive ETCO2, breath sounds checked- equal and bilateral and CO2 detector Secured at: 21 cm Tube secured with: Tape Dental Injury: Teeth and Oropharynx as per pre-operative assessment

## 2022-05-25 NOTE — Transfer of Care (Signed)
Immediate Anesthesia Transfer of Care Note  Patient: George Doyle  Procedure(s) Performed: XI ROBOT ASSISTED UMBILICAL HERNIA REPAIR (Abdomen) INSERTION OF MESH (Abdomen)  Patient Location: PACU  Anesthesia Type:General  Level of Consciousness: drowsy and patient cooperative  Airway & Oxygen Therapy: Patient Spontanous Breathing and Patient connected to face mask oxygen  Post-op Assessment: Report given to RN and Post -op Vital signs reviewed and stable  Post vital signs: Reviewed and stable  Last Vitals:  Vitals Value Taken Time  BP 166/74 05/25/22 1510  Temp 36.7 C 05/25/22 1510  Pulse 57 05/25/22 1512  Resp 10 05/25/22 1512  SpO2 98 % 05/25/22 1512  Vitals shown include unvalidated device data.  Last Pain:  Vitals:   05/25/22 0846  TempSrc: Oral  PainSc: 0-No pain         Complications: No notable events documented.

## 2022-05-25 NOTE — Discharge Instructions (Signed)

## 2022-05-25 NOTE — Op Note (Signed)
  Procedure Date:  05/25/2022  Pre-operative Diagnosis:  Umbilical hernia  Post-operative Diagnosis: Reducible umbilical hernia, 2 cm.  Procedure:  Robotic assisted Umbilical Hernia Repair with mesh  Surgeon:  Melvyn Neth, MD  Anesthesia:  General endotracheal  Estimated Blood Loss:  10 ml  Specimens:  None  Complications:  None  Indications for Procedure:  This is a 71 y.o. male who presents with an umbilical hernia.  The options of surgery versus observation were reviewed with the patient and/or family. The risks of bleeding, abscess or infection, recurrence of symptoms, potential for an open procedure, injury to surrounding structures, and chronic pain were all discussed with the patient and was willing to proceed.  Description of Procedure: The patient was correctly identified in the preoperative area and brought into the operating room.  The patient was placed supine with VTE prophylaxis in place.  Appropriate time-outs were performed.  Anesthesia was induced and the patient was intubated.  Appropriate antibiotics were infused.  The abdomen was prepped and draped in a sterile fashion. [name] hernia defect was marked with a marking pen.  A Veress needle was introduced in the left upper quadrant and pneumoperitoneum was obtained with appropriate pressures.  Using Optiview technique, an 8 mm port was introduced in the left lateral abdominal wall without complications.  Then, a 12 mm port was introduced in the left upper quadrant and an 8 mm port in the left lower quadrant under direct visualization.  A 4.5 inch Bard Ventralight ST Echo mesh, a 0 Stratafix suture, and two 2-0 V-loc sutures were inserted through the 12 mm port under direct visualization.  The DaVinci platform was docked, camera targeted, and instruments placed under direct visualization.  [name] hernia was fully reduced and the peritoneum and preperitoneal fat were dissected and resected to allow better  exposure of the hernia defect and for better mesh placement.  The defect was measured to be 2 cm in size.  The hernia defect was closed using the stratafix suture.  A PMI was brought through the center of the hernia defect and the positioning system of the mesh was passed through.  This allowed the mesh to splay open and be centered over the repair site with good overlap.  The mesh was then sutured in place circumferentially and through the center of the mesh using the V-loc sutures.  All needles and the positioning system were then removed through the 12 mm port without complications.  The preperitoneal fat was placed in an endocatch bag.  The DaVinci platform was then undocked and instruments removed.    50 ml of Exparel solution mixed with 0.5% bupivacaine with epi was infiltrated around the mesh edges, hernia repair site, and port sites.  The 12 mm port was removed and the endocatch removed through it.  The fascia was closed under direct visualization utilizing an Endo Close technique with 0 Vicryl suture.  The 8 mm ports were removed. All incisions were closed using 3-0 Vicryl and 4-0 Monocryl.  The wounds were cleaned and sealed with DermaBond.  The patient was emerged from anesthesia and extubated and brought to the recovery room for further management.  The patient tolerated the procedure well and all counts were correct at the end of the case.   Melvyn Neth, MD

## 2022-05-25 NOTE — Anesthesia Preprocedure Evaluation (Addendum)
Anesthesia Evaluation   Patient awake    Reviewed: Allergy & Precautions, NPO status , Patient's Chart, lab work & pertinent test results, reviewed documented beta blocker date and time   Airway Mallampati: II  TM Distance: >3 FB Neck ROM: full    Dental no notable dental hx.    Pulmonary neg pulmonary ROS, former smoker,    Pulmonary exam normal        Cardiovascular Exercise Tolerance: Good hypertension, Pt. on medications and Pt. on home beta blockers + CAD, + Past MI (2018, 2022) and + DOE  Normal cardiovascular exam+ Valvular Problems/Murmurs MR   diastolic dysfunction, ascending aortic dilatation, vasovagal syncope  Second NSTEMI on 10/23/2020.  Troponins were trended: 307 --> 1304 --> 6774 --> 6895, repeat cardiac catheterization was performed revealing a normal left ventricular systolic function with an EF of >55%.  There was 40% stenosis of the proximal LCx and no CTO of the proximal RCA with good collateral flow.    ECG: Date: 02/27/2022 Time ECG obtained: 1102 AM Rate: 51 bpm Rhythm:  Sinus bradycardia with first-degree AV block Axis (leads I and aVF): Normal Intervals: PR 252 ms. QRS 96 ms. QTc 405 ms. ST segment and T wave changes: Nonspecific lateral ST and T wave abnormalities.  Evidence of an age undetermined inferior infarct.   Comparison: Similar to previous tracing obtained on 07/18/2021   IMAGING / PROCEDURES: LEFT HEART CATHETERIZATION AND CORONARY ANGIOGRAPHY performed on 10/23/2020 1. Normal left ventricular systolic function with an EF of >55% 2. Occluded proximal RCA with collaterals from the LAD to feel PDA, PL, and distal RCA, otherwise no obstructive CAD  TRANSTHORACIC ECHOCARDIOGRAM performed on 10/22/2020 1. Low normal left ventricular systolic function with EF of 50-55% 2. The right ventricle is normal in size 3. Ascending aorta mildly dilated at 39 mm 4. No valvular  regurgitation 5. Normal gradients; no valvular stenosis 6. No pericardial effusion ECG: Date: 02/27/2022 Time ECG obtained: 1102 AM Rate: 51 bpm Rhythm:  Sinus bradycardia with first-degree AV block Axis (leads I and aVF): Normal Intervals: PR 252 ms. QRS 96 ms. QTc 405 ms. ST segment and T wave changes: Nonspecific lateral ST and T wave abnormalities.  Evidence of an age undetermined inferior infarct.   Comparison: Similar to previous tracing obtained on 07/18/2021   IMAGING / PROCEDURES: LEFT HEART CATHETERIZATION AND CORONARY ANGIOGRAPHY performed on 10/23/2020 1. Normal left ventricular systolic function with an EF of >55% 2. Occluded proximal RCA with collaterals from the LAD to feel PDA, PL, and distal RCA, otherwise no obstructive CAD  TRANSTHORACIC ECHOCARDIOGRAM performed on 10/22/2020 1. Low normal left ventricular systolic function with EF of 50-55% 2. The right ventricle is normal in size 3. Ascending aorta mildly dilated at 39 mm 4. No valvular regurgitation 5. Normal gradients; no valvular stenosis 6. No pericardial effusion    Neuro/Psych Seizures - (Last 10/2020), Well Controlled,  H/o 6 mm frontal SDH s/p fall related to tonic-clonic seizure negative psych ROS   GI/Hepatic Neg liver ROS, Umbilical hernia   Endo/Other  negative endocrine ROS  Renal/GU      Musculoskeletal   Abdominal (+) + obese,   Peds  Hematology negative hematology ROS (+)   Anesthesia Other Findings Past Medical History: No date: Allergy No date: Arthritis     Comment:  knees, hands 07/21/2017: Ascending aorta dilatation (HCC)     Comment:  a.) TTE 07/21/2017: Ao root 36 mm, asc Ao 41 mm; b.) TTE  10/22/2020: asc Ao 39 mm No date: Coronary artery disease 20/94/7096: Diastolic dysfunction     Comment:  a.) TTE 07/21/2017 (following NSTEMI): EF 50-55%, inf               HK, mild MR, G1DD No date: Dyspnea No date: Former tobacco use 10/21/2020: History of  2019 novel coronavirus disease (COVID-19) No date: HLD (hyperlipidemia) No date: Hypertension 07/20/2017: NSTEMI (non-ST elevated myocardial infarction) ([place])     Comment:  a.) CP started 5 days prior to presentation (approx               07/15/2017); b.) LHC 07/23/2017: 100% p-d RCA, 60% p-mLCx              (tortuous), 99% post atrio --> unsuccessful PCI; c.)               referred to interventionist Irish Lack) in [place] on               08/30/2017 --> med mgmt recommended 10/23/2020: NSTEMI (non-ST elevated myocardial infarction) ([place])     Comment:  a.) Troponins trended: 307-->1304--> 6774 --> 6895 -->               5886 --> 5324; b.) LHC 10/23/2020: EF >55%; 40% pLCx, CTO              pRCA --> med mgmt. No date: Pneumonia 03/27/2018: SDH (subdural hematoma) (HCC)     Comment:  a.) 6 mm frontal SDH s/p fall related to tonic-clonic               seizure 1970: Seizures ([place])     Comment:  a.) last documented seizure was in 28/3662 No date: Umbilical hernia No date: Vasovagal syncope  Past Surgical History: 07/18/2018: BICEPT TENODESIS; Right     Comment:  Procedure: BICEPS TENODESIS;  Surgeon: Corky Mull,               MD;  Location: [facility];  Service: Orthopedics;                Laterality: Right; 2011: COLONOSCOPY     Comment:  cleared for 10 yrs 11/06/2016: COLONOSCOPY WITH PROPOFOL; N/A     Comment:  Procedure: COLONOSCOPY WITH PROPOFOL;  Surgeon: [name], MD;  Location: [place];  Service:               Endoscopy;  Laterality: N/A; 07/23/2017: CORONARY STENT INTERVENTION; N/A     Comment:  Procedure: CORONARY STENT INTERVENTION;  Surgeon:               Yolonda Kida, MD;  Location: [place] [facility];               Service: Cardiovascular;  Laterality: N/A; No date: CYST EXCISION     Comment:  Forearm No date: INGUINAL HERNIA REPAIR; Left No date: KNEE SURGERY; Bilateral     Comment:  2 on R) 1 on L) 07/23/2017: LEFT HEART CATH AND  CORONARY ANGIOGRAPHY; N/A     Comment:  Procedure: LEFT HEART CATH AND CORONARY ANGIOGRAPHY;                Surgeon: Minna Merritts, MD;  Location: [place]               [facility];  Service: Cardiovascular;  Laterality: N/A; 10/23/2020: LEFT HEART CATH AND  CORONARY ANGIOGRAPHY; Left     Comment:  Procedure: LEFT HEART CATH AND CORONARY ANGIOGRAPHY;               Location: [facility] 11/06/2016: POLYPECTOMY     Comment:  Procedure: POLYPECTOMY;  Surgeon: [name], MD;                Location: [place];  Service: Endoscopy;; 07/18/2018: SHOULDER ARTHROSCOPY WITH OPEN ROTATOR CUFF REPAIR; Right     Comment:  Procedure: SHOULDER ARTHROSCOPY WITH OPEN ROTATOR CUFF               REPAIR;  Surgeon: Corky Mull, MD;  Location: [facility];              Service: Orthopedics;  Laterality: Right;     Reproductive/Obstetrics negative OB ROS                          Anesthesia Physical Anesthesia Plan  ASA: 3  Anesthesia Plan: General ETT   Post-op Pain Management: Gabapentin PO (pre-op)*, Tylenol PO (pre-op)* and Toradol IV (intra-op)*   Induction: Intravenous  PONV Risk Score and Plan: 3 and Ondansetron, Dexamethasone and Treatment may vary due to age or medical condition  Airway Management Planned: Oral ETT  Additional Equipment:   Intra-op Plan:   Post-operative Plan: Extubation in OR  Informed Consent: I have reviewed the patients History and Physical, chart, labs and discussed the procedure including the risks, benefits and alternatives for the proposed anesthesia with the patient or authorized representative who has indicated his/her understanding and acceptance.     Dental Advisory Given  Plan Discussed with: Anesthesiologist, CRNA and Surgeon  Anesthesia Plan Comments:        Anesthesia Quick Evaluation

## 2022-05-25 NOTE — Interval H&P Note (Signed)
History and Physical Interval Note:  05/25/2022 12:12 PM  George Doyle  has presented today for surgery, with the diagnosis of Umbilical hernia reducible less 3 cm K42.9.  The various methods of treatment have been discussed with the patient and family. After consideration of risks, benefits and other options for treatment, the patient has consented to  Procedure(s): XI [place] (N/A) as a surgical intervention.  [name] history has been reviewed, patient examined, no change in status, stable for surgery.  I have reviewed the patient's chart and labs.  Questions were answered to the patient's satisfaction.     George Doyle

## 2022-05-26 ENCOUNTER — Encounter: Payer: Self-pay | Admitting: Surgery

## 2022-05-26 NOTE — Anesthesia Postprocedure Evaluation (Signed)
Anesthesia Post Note  Patient: George Doyle  Procedure(s) Performed: XI ROBOT ASSISTED UMBILICAL HERNIA REPAIR (Abdomen) INSERTION OF MESH (Abdomen)  Patient location during evaluation: PACU Anesthesia Type: General Level of consciousness: awake and alert Pain management: pain level controlled Vital Signs Assessment: post-procedure vital signs reviewed and stable Respiratory status: spontaneous breathing, nonlabored ventilation and respiratory function stable Cardiovascular status: blood pressure returned to baseline and stable Postop Assessment: no apparent nausea or vomiting Anesthetic complications: no   No notable events documented.   Last Vitals:  Vitals:   05/25/22 1548 05/25/22 1558  BP: 134/66 138/73  Pulse: (!) 57 71  Resp: 10 15  Temp: (!) 36.1 C (!) 36.1 C  SpO2: 90% 94%    Last Pain:  Vitals:   05/25/22 1558  TempSrc: Temporal  PainSc: 0-No pain                 [name]

## 2022-05-28 ENCOUNTER — Other Ambulatory Visit: Payer: Self-pay | Admitting: Cardiovascular Disease

## 2022-06-02 ENCOUNTER — Ambulatory Visit: Payer: Medicare HMO | Admitting: Family Medicine

## 2022-06-08 ENCOUNTER — Other Ambulatory Visit: Payer: Self-pay

## 2022-06-08 ENCOUNTER — Ambulatory Visit (INDEPENDENT_AMBULATORY_CARE_PROVIDER_SITE_OTHER): Payer: Medicare HMO | Admitting: Physician Assistant

## 2022-06-08 ENCOUNTER — Encounter: Payer: Self-pay | Admitting: Physician Assistant

## 2022-06-08 VITALS — BP 150/71 | HR 53 | Temp 98.1°F | Ht 69.0 in | Wt 202.0 lb

## 2022-06-08 DIAGNOSIS — K429 Umbilical hernia without obstruction or gangrene: Secondary | ICD-10-CM

## 2022-06-08 DIAGNOSIS — Z09 Encounter for follow-up examination after completed treatment for conditions other than malignant neoplasm: Secondary | ICD-10-CM | POA: Diagnosis not present

## 2022-06-08 NOTE — Patient Instructions (Signed)

## 2022-06-08 NOTE — Progress Notes (Signed)
[facility] POST-OP OFFICE VISIT  06/08/2022  HPI: George Doyle is a 71 y.o. male 15 days s/p robotic assisted laparoscopic umbilical hernia repair with Dr Hampton Abbot   He has done very well Some soreness; never needed narcotics No fever, chills, nausea, emesis, or bowel changes Incisions are healing well No other complaints   Vital signs: BP (!) 150/71   Pulse (!) 53   Temp 98.1 F (36.7 C) (Oral)   Ht '5\' 9"'$  (1.753 m)   Wt 202 lb (91.6 kg)   SpO2 98%   BMI 29.83 kg/m    Physical Exam: Constitutional: Well appearing male, NAD Abdomen: Soft, non-tender, non-distended, no rebound/guarding. Expected induration around the umbilicus consistent with his hernia repair.  Skin: Laparoscopic incisions are healing well, no erythema or drainage   Assessment/Plan: This is a 71 y.o. male 15 days s/p umbilical hernia repair with Dr Hampton Abbot    - Pain control prn  - Reviewed wound care recommendation  - Reviewed lifting restrictions; 6 weeks total  - He can follow up on as needed basis; He, and his wife, understand to call with questions/concerns  -- Edison Simon, PA-C [facility] 06/08/2022, 3:23 PM M-F: 7am - 4pm

## 2022-07-14 ENCOUNTER — Other Ambulatory Visit: Payer: Self-pay

## 2022-07-14 DIAGNOSIS — E785 Hyperlipidemia, unspecified: Secondary | ICD-10-CM

## 2022-07-21 ENCOUNTER — Telehealth: Payer: Self-pay | Admitting: Cardiovascular Disease

## 2022-07-21 MED ORDER — CARVEDILOL 6.25 MG PO TABS: 6.2500 mg | ORAL_TABLET | Freq: Two times a day (BID) | ORAL | 1 refills | Status: DC

## 2022-07-21 NOTE — Telephone Encounter (Signed)
Per Dr. Donivan Scull last ov:  Essential hypertension  Orthostatic in the morning, recommend he decrease Coreg down to 6.25 twice daily  Updated rx sent in as requested. Attempted to call pt to update, but they do not accept blocked #s.

## 2022-07-21 NOTE — Telephone Encounter (Signed)
Pt c/o medication issue:  1. Name of Medication: carvedilol (COREG) 6.25 MG tablet   2. How are you currently taking this medication (dosage and times per day)? 1 tablet 2 times daily.  3. Are you having a reaction (difficulty breathing--STAT)?   4. What is your medication issue? Patient's wife called stating [facility] has the wrong instructions for this medication it should be 1 tablet twice daily.  She said [facility] it as 2 tablets 2 daily.  If someone can please call the   [id number] [place] [address] Phone: [phone] Fax: [phone]   And get this correct for him.

## 2022-08-08 ENCOUNTER — Ambulatory Visit: Payer: Medicare HMO | Admitting: Urology

## 2022-08-28 ENCOUNTER — Ambulatory Visit (INDEPENDENT_AMBULATORY_CARE_PROVIDER_SITE_OTHER): Payer: Medicare HMO | Admitting: Family Medicine

## 2022-08-28 ENCOUNTER — Encounter: Payer: Self-pay | Admitting: Family Medicine

## 2022-08-28 VITALS — BP 128/64 | HR 58 | Ht 69.0 in | Wt 210.0 lb

## 2022-08-28 DIAGNOSIS — E785 Hyperlipidemia, unspecified: Secondary | ICD-10-CM

## 2022-08-28 MED ORDER — ATORVASTATIN CALCIUM 80 MG PO TABS: ORAL_TABLET | ORAL | 1 refills | Status: DC

## 2022-08-28 NOTE — Progress Notes (Signed)
Date:  08/28/2022   Name:  George Doyle   DOB:  1951/04/17   MRN:  [id number]   Chief Complaint: Hyperlipidemia  Hyperlipidemia This is a chronic problem. The current episode started more than 1 year ago. The problem is controlled. Recent lipid tests were reviewed and are normal. He has no history of chronic renal disease, diabetes, hypothyroidism, liver disease, obesity or nephrotic syndrome. There are no known factors aggravating his hyperlipidemia. Pertinent negatives include no chest pain, focal sensory loss, focal weakness, leg pain, myalgias or shortness of breath. He is currently on no antihyperlipidemic treatment. The current treatment provides moderate improvement of lipids. There are no compliance problems.  Risk factors for coronary artery disease include dyslipidemia.    Lab Results  Component Value Date   NA 134 05/03/2022   K 4.5 05/03/2022   CO2 22 05/03/2022   GLUCOSE 110 (H) 05/03/2022   BUN 17 05/03/2022   CREATININE 1.20 05/03/2022   CALCIUM 9.6 05/03/2022   EGFR 64 05/03/2022   GFRNONAA 53 (L) 07/15/2021   Lab Results  Component Value Date   CHOL 101 05/03/2022   HDL 58 05/03/2022   LDLCALC 29 05/03/2022   TRIG 65 05/03/2022   CHOLHDL 2.1 06/11/2018   No results found for: "TSH" No results found for: "HGBA1C" Lab Results  Component Value Date   WBC 8.3 08/01/2021   HGB 13.3 08/01/2021   HCT 39.4 08/01/2021   MCV 90 08/01/2021   PLT 292 08/01/2021   Lab Results  Component Value Date   ALT 10 05/03/2022   AST 18 05/03/2022   ALKPHOS 90 05/03/2022   BILITOT 0.8 05/03/2022   No results found for: "25OHVITD2", "25OHVITD3", "VD25OH"   Review of Systems  Constitutional:  Negative for chills and fever.  HENT:  Negative for sore throat.   Respiratory:  Negative for shortness of breath and wheezing.   Cardiovascular:  Negative for chest pain, palpitations and leg swelling.  Gastrointestinal:  Negative for abdominal pain and nausea.   Endocrine: Negative for polydipsia, polyphagia and polyuria.  Genitourinary:  Negative for dysuria, frequency, hematuria and urgency.  Musculoskeletal:  Negative for back pain, myalgias and neck pain.  Skin:  Negative for rash.  Allergic/Immunologic: Negative for environmental allergies.  Neurological:  Negative for dizziness, focal weakness and headaches.  Hematological:  Does not bruise/bleed easily.  Psychiatric/Behavioral:  Negative for suicidal ideas. The patient is not nervous/anxious.     Patient Active Problem List   Diagnosis Date Noted   Umbilical hernia without obstruction and without gangrene    Benign neoplasm of skin of left forearm 01/27/2019   Pure hypercholesterolemia 06/21/2018   Poorly-controlled hypertension 06/21/2018   Seizure disorder ([place]) 06/21/2018   Hill-Sachs lesion of right shoulder 06/19/2018   Injury of tendon of long head of right biceps 06/19/2018   Rotator cuff tendinitis, right 06/19/2018   Traumatic complete tear of right rotator cuff 06/19/2018   Headache disorder 05/09/2018   CAD (coronary artery disease), native coronary artery 03/28/2018   History of seizures 03/28/2018   Subdural hematoma ([place]) 03/27/2018   Vasovagal syncope 03/27/2018   Old MI (myocardial infarction) 08/30/2017   CAD, multiple vessel 08/01/2017   NSTEMI (non-ST elevated myocardial infarction) ([place]) 07/20/2017   Hyperlipidemia 05/10/2017   Preop cardiovascular exam    Benign neoplasm of ascending colon    Polyp of sigmoid colon    Essential hypertension 03/03/2016    No Known Allergies  Past Surgical History:  Procedure  Laterality Date   BICEPT TENODESIS Right 07/18/2018   Procedure: BICEPS TENODESIS;  Surgeon: Corky Mull, MD;  Location: [facility];  Service: Orthopedics;  Laterality: Right;   COLONOSCOPY  2011   cleared for 10 yrs   COLONOSCOPY WITH PROPOFOL N/A 11/06/2016   Procedure: COLONOSCOPY WITH PROPOFOL;  Surgeon: [name], MD;  Location: [place];  Service: Endoscopy;  Laterality: N/A;   CORONARY STENT INTERVENTION N/A 07/23/2017   Procedure: CORONARY STENT INTERVENTION;  Surgeon: Yolonda Kida, MD;  Location: [place] [facility];  Service: Cardiovascular;  Laterality: N/A;   CYST EXCISION     Forearm   INGUINAL HERNIA REPAIR Left    INSERTION OF MESH  05/25/2022   Procedure: INSERTION OF MESH;  Surgeon: Olean Ree, MD;  Location: [facility];  Service: General;;   KNEE SURGERY Bilateral    2 on R) 1 on L)   LEFT HEART CATH AND CORONARY ANGIOGRAPHY N/A 07/23/2017   Procedure: LEFT HEART CATH AND CORONARY ANGIOGRAPHY;  Surgeon: Minna Merritts, MD;  Location: [place] [facility];  Service: Cardiovascular;  Laterality: N/A;   LEFT HEART CATH AND CORONARY ANGIOGRAPHY Left 10/23/2020   Procedure: LEFT HEART CATH AND CORONARY ANGIOGRAPHY; Location: [facility]   POLYPECTOMY  11/06/2016   Procedure: POLYPECTOMY;  Surgeon: [name], MD;  Location: [place];  Service: Endoscopy;;   SHOULDER ARTHROSCOPY WITH OPEN ROTATOR CUFF REPAIR Right 07/18/2018   Procedure: SHOULDER ARTHROSCOPY WITH OPEN ROTATOR CUFF REPAIR;  Surgeon: Corky Mull, MD;  Location: [facility];  Service: Orthopedics;  Laterality: Right;    Social History   Tobacco Use   Smoking status: Former    Types: Cigarettes   Smokeless tobacco: Never   Tobacco comments:    quit over 20 yrs ago  Vaping Use   Vaping Use: Never used  Substance Use Topics   Alcohol use: Yes    Alcohol/week: 0.0 standard drinks of alcohol    Comment: 1-2 drinks/year   Drug use: No     Medication list has been reviewed and updated.  Current Meds  Medication Sig   acetaminophen (TYLENOL) 500 MG tablet Take 500 mg by mouth at bedtime.   acetaminophen (TYLENOL) 500 MG tablet Take 2 tablets (1,000 mg total) by mouth every 6 (six) hours as needed for mild pain.   aspirin EC 81 MG EC tablet Take 1 tablet (81 mg total) daily by mouth.   atorvastatin (LIPITOR) 80 MG  tablet TAKE 1 TABLET(80 MG) BY MOUTH DAILY AT 6 PM   carvedilol (COREG) 6.25 MG tablet Take 1 tablet (6.25 mg total) by mouth 2 (two) times daily.   citalopram (CELEXA) 20 MG tablet Take 20 mg by mouth at bedtime.   donepezil (ARICEPT) 5 MG tablet Take 5 mg by mouth at bedtime.   EPINEPHrine 0.3 mg/0.3 mL IJ SOAJ injection Inject 0.3 mg into the muscle as needed for anaphylaxis.   fexofenadine (ALLEGRA) 180 MG tablet Take 180 mg by mouth as needed for allergies or rhinitis. otc   ibuprofen (ADVIL) 600 MG tablet Take 1 tablet (600 mg total) by mouth every 8 (eight) hours as needed for moderate pain.   lamoTRIgine (LAMICTAL) 200 MG tablet Take 200 mg by mouth 2 (two) times daily.   latanoprost (XALATAN) 0.005 % ophthalmic solution Place 1 drop into both eyes at bedtime.   losartan (COZAAR) 50 MG tablet Take 1 tablet (50 mg total) by mouth 2 (two) times daily.   nitroGLYCERIN (  NITROSTAT) 0.4 MG SL tablet PLACE 1 TABLET UNDER THE TONGUE EVERY 5 MINUTES FOR 3 DOSES AS NEEDED FOR CHEST PAIN   spironolactone (ALDACTONE) 25 MG tablet TAKE 1 TABLET(25 MG) BY MOUTH DAILY       08/28/2022   10:32 AM 05/03/2022   11:36 AM 11/29/2021    1:24 PM 08/01/2021    1:49 PM  GAD 7 : Generalized Anxiety Score  Nervous, Anxious, on Edge 0 0 1 1  Control/stop worrying 0 0 0 0  Worry too much - different things 0 0 0 0  Trouble relaxing 0 0 0 0  Restless 0 0 0 0  Easily annoyed or irritable 0 0 0 0  Afraid - awful might happen 0 0 0 0  Total GAD 7 Score 0 0 1 1  Anxiety Difficulty Not difficult at all Not difficult at all Not difficult at all        08/28/2022   10:32 AM 05/03/2022   11:36 AM 11/29/2021    1:24 PM  Depression screen PHQ 2/9  Decreased Interest 0 0 0  Down, Depressed, Hopeless 0 0 0  PHQ - 2 Score 0 0 0  Altered sleeping 0 0 0  Tired, decreased energy 0 0 0  Change in appetite 0 0 0  Feeling bad or failure about yourself  0 0 0  Trouble concentrating 0 0 0  Moving slowly or  fidgety/restless 0 0 0  Suicidal thoughts 0 0 0  PHQ-9 Score 0 0 0  Difficult doing work/chores Not difficult at all Not difficult at all Not difficult at all    BP Readings from Last 3 Encounters:  08/28/22 128/64  06/08/22 (!) 150/71  05/25/22 138/73    Physical Exam Vitals and nursing note reviewed.  HENT:     Head: Normocephalic.     Right Ear: External ear normal.     Left Ear: External ear normal.     Nose: Nose normal.     Mouth/Throat:     Pharynx: Oropharynx is clear.  Eyes:     General: No scleral icterus.       Right eye: No discharge.        Left eye: No discharge.     Conjunctiva/sclera: Conjunctivae normal.     Pupils: Pupils are equal, round, and reactive to light.  Neck:     Thyroid: No thyromegaly.     Vascular: No JVD.     Trachea: No tracheal deviation.  Cardiovascular:     Rate and Rhythm: Normal rate and regular rhythm.     Pulses: Normal pulses.     Heart sounds: Normal heart sounds, S1 normal and S2 normal. No murmur heard.    No systolic murmur is present.     No diastolic murmur is present.     No friction rub. No gallop. No S3 or S4 sounds.  Pulmonary:     Effort: No respiratory distress.     Breath sounds: Normal breath sounds. No wheezing, rhonchi or rales.  Abdominal:     General: Bowel sounds are normal.     Palpations: Abdomen is soft. There is no hepatomegaly, splenomegaly or mass.     Tenderness: There is no abdominal tenderness. There is no guarding or rebound.  Musculoskeletal:        General: No tenderness. Normal range of motion.     Cervical back: Normal range of motion and neck supple.     Right lower leg: No edema.  Left lower leg: No edema.  Lymphadenopathy:     Cervical: No cervical adenopathy.  Skin:    General: Skin is warm.     Findings: No rash.  Neurological:     Mental Status: He is alert.     Wt Readings from Last 3 Encounters:  08/28/22 210 lb (95.3 kg)  06/08/22 202 lb (91.6 kg)  05/25/22 205 lb (93  kg)    BP 128/64   Pulse (!) 58   Ht _0  (1.753 m)   Wt 210 lb (95.3 kg)   SpO2 97%   BMI 31.01 kg/m   Assessment and Plan:  1. Hyperlipidemia, unspecified hyperlipidemia type Chronic.  Controlled.  Stable.  Tolerating medication well and we will Continue atorvastatin 80 mg once a day.  Review of previous lipid panels have been in normal range for the last several years.  We will continue at present dosing and will recheck with labs in 6 months. - atorvastatin (LIPITOR) 80 MG tablet; TAKE 1 TABLET(80 MG) BY MOUTH DAILY AT 6 PM  Dispense: 90 tablet; Refill: 1    Otilio Miu, MD

## 2022-09-12 ENCOUNTER — Other Ambulatory Visit: Payer: Self-pay | Admitting: Cardiovascular Disease

## 2022-11-15 ENCOUNTER — Ambulatory Visit (INDEPENDENT_AMBULATORY_CARE_PROVIDER_SITE_OTHER): Payer: Medicare HMO

## 2022-11-15 VITALS — Ht 69.0 in | Wt 210.0 lb

## 2022-11-15 DIAGNOSIS — Z0000 Encounter for general adult medical examination without abnormal findings: Secondary | ICD-10-CM

## 2022-11-15 NOTE — Progress Notes (Signed)
I connected with  [name] on 11/15/22 by a audio enabled telemedicine application and verified that I am speaking with the correct person using two identifiers.  Patient Location: Home  Provider Location: Office/Clinic  I discussed the limitations of evaluation and management by telemedicine. The patient expressed understanding and agreed to proceed.  Subjective:   George Doyle is a 72 y.o. male who presents for Medicare Annual/Subsequent preventive examination.  Review of Systems     Cardiac Risk Factors include: advanced age (>53mn, >>27women);dyslipidemia;male gender;hypertension;obesity (BMI >30kg/m2)     Objective:    There were no vitals filed for this visit. There is no height or weight on file to calculate BMI.     11/15/2022    2:03 PM 05/18/2022   11:07 AM 11/14/2021    2:13 PM 11/10/2020    2:12 PM 11/10/2019    2:52 PM 07/18/2018    6:18 AM 07/15/2018    3:10 PM  Advanced Directives  Does Patient Have a Medical Advance Directive? No Yes Yes Yes Yes Yes Yes  Type of ASocial research officer, governmentLiving will HSoddy-DaisyLiving will HI[place]Living will HSouth Amb[place]Living will HMortonLiving will HSands PointLiving will  Does patient want to make changes to medical advance directive?  No - Patient declined    No - Patient declined No - Patient declined  Copy of [place] Chart?  No - copy requested Yes - validated most recent copy scanned in chart (See row information) Yes - validated most recent copy scanned in chart (See row information) Yes - validated most recent copy scanned in chart (See row information) Yes Yes  Would patient like information on creating a medical advance directive? Yes (MAU/Ambulatory/Procedural Areas - Information given)          Current Medications (verified) Outpatient Encounter  Medications as of 11/15/2022  Medication Sig   acetaminophen (TYLENOL) 500 MG tablet Take 2 tablets (1,000 mg total) by mouth every 6 (six) hours as needed for mild pain.   aspirin EC 81 MG EC tablet Take 1 tablet (81 mg total) daily by mouth.   atorvastatin (LIPITOR) 80 MG tablet TAKE 1 TABLET(80 MG) BY MOUTH DAILY AT 6 PM   carvedilol (COREG) 6.25 MG tablet Take 1 tablet (6.25 mg total) by mouth 2 (two) times daily.   citalopram (CELEXA) 20 MG tablet Take 20 mg by mouth at bedtime.   donepezil (ARICEPT) 5 MG tablet Take 5 mg by mouth at bedtime.   EPINEPHrine 0.3 mg/0.3 mL IJ SOAJ injection Inject 0.3 mg into the muscle as needed for anaphylaxis.   fexofenadine (ALLEGRA) 180 MG tablet Take 180 mg by mouth as needed for allergies or rhinitis. otc   ibuprofen (ADVIL) 600 MG tablet Take 1 tablet (600 mg total) by mouth every 8 (eight) hours as needed for moderate pain.   lamoTRIgine (LAMICTAL) 200 MG tablet Take 200 mg by mouth 2 (two) times daily.   latanoprost (XALATAN) 0.005 % ophthalmic solution Place 1 drop into both eyes at bedtime.   losartan (COZAAR) 50 MG tablet Take 1 tablet (50 mg total) by mouth 2 (two) times daily.   nitroGLYCERIN (NITROSTAT) 0.4 MG SL tablet PLACE 1 TABLET UNDER THE TONGUE EVERY 5 MINUTES FOR 3 DOSES AS NEEDED FOR CHEST PAIN   spironolactone (ALDACTONE) 25 MG tablet TAKE 1 TABLET BY MOUTH EVERY DAY   [DISCONTINUED] acetaminophen (TYLENOL)  500 MG tablet Take 500 mg by mouth at bedtime.   No facility-administered encounter medications on file as of 11/15/2022.    Allergies (verified) Patient has no known allergies.   History: Past Medical History:  Diagnosis Date   Allergy    Arthritis    knees, hands   Ascending aorta dilatation ([facility]) 07/21/2017   a.) TTE 07/21/2017: Ao root 36 mm, asc Ao 41 mm; b.) TTE 10/22/2020: asc Ao 39 mm   Coronary artery disease    Diastolic dysfunction [id number]   a.) TTE 07/21/2017 (following NSTEMI): EF 50-55%, inf HK, mild MR,  G1DD   Dyspnea    Former tobacco use    History of 2019 novel coronavirus disease (COVID-19) 10/21/2020   HLD (hyperlipidemia)    Hypertension    NSTEMI (non-ST elevated myocardial infarction) ([facility]) 07/20/2017   a.) CP started 5 days prior to presentation (approx 07/15/2017); b.) LHC 07/23/2017: 100% p-d RCA, 60% p-mLCx (tortuous), 99% post atrio --> unsuccessful PCI; c.) referred to interventionist Irish Lack) in [place] on 08/30/2017 --> med mgmt recommended   NSTEMI (non-ST elevated myocardial infarction) ([facility]) 10/23/2020   a.) Troponins trended: 307-->1304--> 6774 --> 6895 --> 5886 --> 5324; b.) LHC 10/23/2020: EF >55%; 40% pLCx, CTO pRCA --> med mgmt.   Pneumonia    SDH (subdural hematoma) ([facility]) 03/27/2018   a.) 6 mm frontal SDH s/p fall related to tonic-clonic seizure   Seizures ([place]) 1970   a.) last documented seizure was in 0000000   Umbilical hernia    Vasovagal syncope    Past Surgical History:  Procedure Laterality Date   BICEPT TENODESIS Right 07/18/2018   Procedure: BICEPS TENODESIS;  Surgeon: Corky Mull, MD;  Location: [facility];  Service: Orthopedics;  Laterality: Right;   COLONOSCOPY  2011   cleared for 10 yrs   COLONOSCOPY WITH PROPOFOL N/A 11/06/2016   Procedure: COLONOSCOPY WITH PROPOFOL;  Surgeon: [name], MD;  Location: [facility];  Service: Endoscopy;  Laterality: N/A;   CORONARY STENT INTERVENTION N/A 07/23/2017   Procedure: CORONARY STENT INTERVENTION;  Surgeon: Yolonda Kida, MD;  Location: [facility];  Service: Cardiovascular;  Laterality: N/A;   CYST EXCISION     Forearm   INGUINAL HERNIA REPAIR Left    INSERTION OF MESH  05/25/2022   Procedure: INSERTION OF MESH;  Surgeon: Olean Ree, MD;  Location: [facility];  Service: General;;   KNEE SURGERY Bilateral    2 on R) 1 on L)   LEFT HEART CATH AND CORONARY ANGIOGRAPHY N/A 07/23/2017   Procedure: LEFT HEART CATH AND CORONARY ANGIOGRAPHY;  Surgeon: Minna Merritts, MD;  Location:  [facility];  Service: Cardiovascular;  Laterality: N/A;   LEFT HEART CATH AND CORONARY ANGIOGRAPHY Left 10/23/2020   Procedure: LEFT HEART CATH AND CORONARY ANGIOGRAPHY; Location: [facility]   POLYPECTOMY  11/06/2016   Procedure: POLYPECTOMY;  Surgeon: [name], MD;  Location: [facility];  Service: Endoscopy;;   SHOULDER ARTHROSCOPY WITH OPEN ROTATOR CUFF REPAIR Right 07/18/2018   Procedure: SHOULDER ARTHROSCOPY WITH OPEN ROTATOR CUFF REPAIR;  Surgeon: Corky Mull, MD;  Location: [facility];  Service: Orthopedics;  Laterality: Right;   Family History  Problem Relation Age of Onset   CAD Father    Lung cancer Father    Heart failure Mother    Social History   Socioeconomic History   Marital status: Married    Spouse name: Arbie Cookey   Number of children: 2   Years  of education: Not on file   Highest education level: Not on file  Occupational History   Not on file  Tobacco Use   Smoking status: Former    Types: Cigarettes   Smokeless tobacco: Never   Tobacco comments:    quit over 20 yrs ago  Vaping Use   Vaping Use: Never used  Substance and Sexual Activity   Alcohol use: Yes    Alcohol/week: 0.0 standard drinks of alcohol    Comment: 1-2 drinks/year   Drug use: No   Sexual activity: Not Currently  Other Topics Concern   Not on file  Social History Narrative   Not on file   Social Determinants of Health   Financial Resource Strain: Low Risk  (11/15/2022)   Overall Financial Resource Strain (CARDIA)    Difficulty of Paying Living Expenses: Not hard at all  Food Insecurity: No Food Insecurity (11/15/2022)   Hunger Vital Sign    Worried About Running Out of Food in the Last Year: Never true    Ran Out of Food in the Last Year: Never true  Transportation Needs: No Transportation Needs (11/15/2022)   PRAPARE - Hydrologist (Medical): No    Lack of Transportation (Non-Medical): No  Physical Activity: Sufficiently Active (11/15/2022)    Exercise Vital Sign    Days of Exercise per Week: 7 days    Minutes of Exercise per Session: 30 min  Stress: No Stress Concern Present (11/15/2022)   [place]    Feeling of Stress : Not at all  Social Connections: Moderately Isolated (11/15/2022)   Social Connection and Isolation Panel [NHANES]    Frequency of Communication with Friends and Family: Once a week    Frequency of Social Gatherings with Friends and Family: More than three times a week    Attends Religious Services: Never    Marine scientist or Organizations: No    Attends Music therapist: Never    Marital Status: Married    Tobacco Counseling Counseling given: Not Answered Tobacco comments: quit over 20 yrs ago   Clinical Intake:  Pre-visit preparation completed: Yes  Pain : No/denies pain     Nutritional Risks: None Diabetes: No  How often do you need to have someone help you when you read instructions, pamphlets, or other written materials from your doctor or pharmacy?: 1 - Never  Diabetic?no  Interpreter Needed?: No  Information entered by :: Kirke Shaggy, LPN   Activities of Daily Living    11/15/2022    2:04 PM 05/18/2022   11:08 AM  In your present state of health, do you have any difficulty performing the following activities:  Hearing? 0   Vision? 0   Difficulty concentrating or making decisions? 0   Walking or climbing stairs? 1   Dressing or bathing? 0   Doing errands, shopping? 0 0  Preparing Food and eating ? N   Using the Toilet? N   In the past six months, have you accidently leaked urine? N   Do you have problems with loss of bowel control? N   Managing your Medications? N   Managing your Finances? N   Housekeeping or managing your Housekeeping? N     Patient Care Team: Juline Patch, MD as PCP - General (Family Medicine) Minna Merritts, MD as PCP - Cardiology (Cardiology) Anabel Bene, MD as Referring Physician (Neurology) Billey Co,  MD as Consulting Physician (Urology) [name], MD as Consulting Physician (Gastroenterology)  Indicate any recent Medical Services you may have received from other than [facility] providers in the past year (date may be approximate).     Assessment:   This is a routine wellness examination for Biko.  Hearing/Vision screen Hearing Screening - Comments:: No aids Vision Screening - Comments:: Wears glasses- Dr.Hampton  Dietary issues and exercise activities discussed: Current Exercise Habits: Home exercise routine, Type of exercise: walking, Time (Minutes): 25, Frequency (Times/Week): 7, Weekly Exercise (Minutes/Week): 175, Intensity: Mild   Goals Addressed             This Visit's Progress    DIET - EAT MORE FRUITS AND VEGETABLES         Depression Screen    11/15/2022    2:01 PM 08/28/2022   10:32 AM 05/03/2022   11:36 AM 11/29/2021    1:24 PM 11/14/2021    2:12 PM 08/01/2021    1:49 PM 04/06/2021    9:52 AM  PHQ 2/9 Scores  PHQ - 2 Score 0 0 0 0 0 0 0  PHQ- 9 Score 0 0 0 0 0 0 0    Fall Risk    11/15/2022    2:03 PM 08/28/2022   10:32 AM 06/08/2022    3:10 PM 05/10/2022   11:08 AM 05/03/2022   11:36 AM  Fall Risk   Falls in the past year? 0 0 0 0 0  Number falls in past yr: 0 0   0  Injury with Fall? 0 0   0  Risk for fall due to : No Fall Risks No Fall Risks   No Fall Risks  Follow up Falls prevention discussed;Falls evaluation completed Falls evaluation completed   Falls evaluation completed    FALL RISK PREVENTION PERTAINING TO THE HOME:  Any stairs in or around the home? Yes  If so, are there any without handrails? No  Home free of loose throw rugs in walkways, pet beds, electrical cords, etc? Yes  Adequate lighting in your home to reduce risk of falls? Yes   ASSISTIVE DEVICES UTILIZED TO PREVENT FALLS:  Life alert? No  Use of a cane, walker or w/c? No  Grab bars in the bathroom? Yes   Shower chair or bench in shower? Yes  Elevated toilet seat or a handicapped toilet? Yes    Cognitive Function:        11/15/2022    2:07 PM 11/10/2019    2:58 PM  6CIT Screen  What Year? 0 points 0 points  What month? 0 points 0 points  What time? 0 points 0 points  Count back from 20 2 points 0 points  Months in reverse 2 points 0 points  Repeat phrase 0 points 2 points  Total Score 4 points 2 points    Immunizations Immunization History  Administered Date(s) Administered   Fluad Quad(high Dose 65+) 06/18/2019   Influenza, High Dose Seasonal PF 06/25/2018, 07/01/2021   Influenza,inj,Quad PF,6+ Mos 05/10/2017   Influenza-Unspecified 06/28/2015, 06/07/2020, 07/20/2022   PFIZER(Purple Top)SARS-COV-2 Vaccination 10/11/2019, 11/01/2019, 06/19/2020   Pneumococcal Conjugate-13 10/12/2016   Pneumococcal Polysaccharide-23 07/12/2015, 12/25/2017   Respiratory Syncytial Virus Vaccine,Recomb Aduvanted(Arexvy) 07/20/2022   Tdap 10/12/2016   Zoster Recombinat (Shingrix) 04/26/2017, 08/23/2017    TDAP status: Up to date  Flu Vaccine status: Up to date  Pneumococcal vaccine status: Up to date  Covid-19 vaccine status: Completed vaccines  Qualifies for Shingles Vaccine? Yes  Zostavax completed No   Shingrix Completed?: Yes  Screening Tests Health Maintenance  Topic Date Due   COVID-19 Vaccine (4 - 2023-24 season) 05/19/2022   COLONOSCOPY (Pts 45-46yr Insurance coverage will need to be confirmed)  11/30/2022 (Originally 11/06/2021)   Medicare Annual Wellness (AWV)  11/16/2023   DTaP/Tdap/Td (2 - Td or Tdap) 10/12/2026   Pneumonia Vaccine 74 Years old  Completed   INFLUENZA VACCINE  Completed   Hepatitis C Screening  Completed   Zoster Vaccines- Shingrix  Completed   HPV VACCINES  Aged Out    Health Maintenance  Health Maintenance Due  Topic Date Due   COVID-19 Vaccine (4 - 2023-24 season) 05/19/2022    Colorectal cancer screening: Type of screening: Colonoscopy.  Completed 11/06/16. Repeat every 5 years- hasn't decided if he is going to have one  Lung Cancer Screening: (Low Dose CT Chest recommended if Age 673-80years, 30 pack-year currently smoking OR have quit w/in 15years.) does not qualify.   Additional Screening:  Hepatitis C Screening: does qualify; Completed 05/10/17  Vision Screening: Recommended annual ophthalmology exams for early detection of glaucoma and other disorders of the eye. Is the patient up to date with their annual eye exam?  Yes  Who is the provider or what is the name of the office in which the patient attends annual eye exams? Dr.Hampton If pt is not established with a provider, would they like to be referred to a provider to establish care? No .   Dental Screening: Recommended annual dental exams for proper oral hygiene  Community Resource Referral / Chronic Care Management: CRR required this visit?  No   CCM required this visit?  No      Plan:     I have personally reviewed and noted the following in the patient's chart:   Medical and social history Use of alcohol, tobacco or illicit drugs  Current medications and supplements including opioid prescriptions. Patient is not currently taking opioid prescriptions. Functional ability and status Nutritional status Physical activity Advanced directives List of other physicians Hospitalizations, surgeries, and ER visits in previous 12 months Vitals Screenings to include cognitive, depression, and falls Referrals and appointments  In addition, I have reviewed and discussed with patient certain preventive protocols, quality metrics, and best practice recommendations. A written personalized care plan for preventive services as well as general preventive health recommendations were provided to patient.     LDionisio David LPN   [id number]  Nurse Notes: none

## 2022-11-15 NOTE — Patient Instructions (Signed)
George Doyle , Thank you for taking time to come for your Medicare Wellness Visit. I appreciate your ongoing commitment to your health goals. Please review the following plan we discussed and let me know if I can assist you in the future.   These are the goals we discussed:  Goals      DIET - EAT MORE FRUITS AND VEGETABLES     DIET - INCREASE WATER INTAKE     Recommend drinking 6-8 glasses of water per day         This is a list of the screening recommended for you and due dates:  Health Maintenance  Topic Date Due   COVID-19 Vaccine (4 - 2023-24 season) 05/19/2022   Colon Cancer Screening  11/30/2022*   Medicare Annual Wellness Visit  11/16/2023   DTaP/Tdap/Td vaccine (2 - Td or Tdap) 10/12/2026   Pneumonia Vaccine  Completed   Flu Shot  Completed   Hepatitis C Screening: USPSTF Recommendation to screen - Ages 18-79 yo.  Completed   Zoster (Shingles) Vaccine  Completed   HPV Vaccine  Aged Out  *Topic was postponed. The date shown is not the original due date.    Advanced directives: yes  Conditions/risks identified: none  Next appointment: Follow up in one year for your annual wellness visit. 11/21/23 @ 11:15 am by phone  Preventive Care 65 Years and Older, Male  Preventive care refers to lifestyle choices and visits with your health care provider that can promote health and wellness. What does preventive care include? A yearly physical exam. This is also called an annual well check. Dental exams once or twice a year. Routine eye exams. Ask your health care provider how often you should have your eyes checked. Personal lifestyle choices, including: Daily care of your teeth and gums. Regular physical activity. Eating a healthy diet. Avoiding tobacco and drug use. Limiting alcohol use. Practicing safe sex. Taking low doses of aspirin every day. Taking vitamin and mineral supplements as recommended by your health care provider. What happens during an annual well  check? The services and screenings done by your health care provider during your annual well check will depend on your age, overall health, lifestyle risk factors, and family history of disease. Counseling  Your health care provider may ask you questions about your: Alcohol use. Tobacco use. Drug use. Emotional well-being. Home and relationship well-being. Sexual activity. Eating habits. History of falls. Memory and ability to understand (cognition). Work and work Statistician. Screening  You may have the following tests or measurements: Height, weight, and BMI. Blood pressure. Lipid and cholesterol levels. These may be checked every 5 years, or more frequently if you are over 74 years old. Skin check. Lung cancer screening. You may have this screening every year starting at age 74 if you have a 30-pack-year history of smoking and currently smoke or have quit within the past 15 years. Fecal occult blood test (FOBT) of the stool. You may have this test every year starting at age 74. Flexible sigmoidoscopy or colonoscopy. You may have a sigmoidoscopy every 5 years or a colonoscopy every 10 years starting at age 74. Prostate cancer screening. Recommendations will vary depending on your family history and other risks. Hepatitis C blood test. Hepatitis B blood test. Sexually transmitted disease (STD) testing. Diabetes screening. This is done by checking your blood sugar (glucose) after you have not eaten for a while (fasting). You may have this done every 1-3 years. Abdominal aortic aneurysm (AAA) screening. You  may need this if you are a current or former smoker. Osteoporosis. You may be screened starting at age 74 if you are at high risk. Talk with your health care provider about your test results, treatment options, and if necessary, the need for more tests. Vaccines  Your health care provider may recommend certain vaccines, such as: Influenza vaccine. This is recommended every  year. Tetanus, diphtheria, and acellular pertussis (Tdap, Td) vaccine. You may need a Td booster every 10 years. Zoster vaccine. You may need this after age 30. Pneumococcal 13-valent conjugate (PCV13) vaccine. One dose is recommended after age 74. Pneumococcal polysaccharide (PPSV23) vaccine. One dose is recommended after age 74. Talk to your health care provider about which screenings and vaccines you need and how often you need them. This information is not intended to replace advice given to you by your health care provider. Make sure you discuss any questions you have with your health care provider. Document Released: 10/01/2015 Document Revised: 05/24/2016 Document Reviewed: 07/06/2015 Elsevier Interactive Patient Education  2017 [place] Prevention in the Home Falls can cause injuries. They can happen to people of all ages. There are many things you can do to make your home safe and to help prevent falls. What can I do on the outside of my home? Regularly fix the edges of walkways and driveways and fix any cracks. Remove anything that might make you trip as you walk through a door, such as a raised step or threshold. Trim any bushes or trees on the path to your home. Use bright outdoor lighting. Clear any walking paths of anything that might make someone trip, such as rocks or tools. Regularly check to see if handrails are loose or broken. Make sure that both sides of any steps have handrails. Any raised decks and porches should have guardrails on the edges. Have any leaves, snow, or ice cleared regularly. Use sand or salt on walking paths during winter. Clean up any spills in your garage right away. This includes oil or grease spills. What can I do in the bathroom? Use night lights. Install grab bars by the toilet and in the tub and shower. Do not use towel bars as grab bars. Use non-skid mats or decals in the tub or shower. If you need to sit down in the shower, use a  plastic, non-slip stool. Keep the floor dry. Clean up any water that spills on the floor as soon as it happens. Remove soap buildup in the tub or shower regularly. Attach bath mats securely with double-sided non-slip rug tape. Do not have throw rugs and other things on the floor that can make you trip. What can I do in the bedroom? Use night lights. Make sure that you have a light by your bed that is easy to reach. Do not use any sheets or blankets that are too big for your bed. They should not hang down onto the floor. Have a firm chair that has side arms. You can use this for support while you get dressed. Do not have throw rugs and other things on the floor that can make you trip. What can I do in the kitchen? Clean up any spills right away. Avoid walking on wet floors. Keep items that you use a lot in easy-to-reach places. If you need to reach something above you, use a strong step stool that has a grab bar. Keep electrical cords out of the way. Do not use floor polish or wax that  makes floors slippery. If you must use wax, use non-skid floor wax. Do not have throw rugs and other things on the floor that can make you trip. What can I do with my stairs? Do not leave any items on the stairs. Make sure that there are handrails on both sides of the stairs and use them. Fix handrails that are broken or loose. Make sure that handrails are as long as the stairways. Check any carpeting to make sure that it is firmly attached to the stairs. Fix any carpet that is loose or worn. Avoid having throw rugs at the top or bottom of the stairs. If you do have throw rugs, attach them to the floor with carpet tape. Make sure that you have a light switch at the top of the stairs and the bottom of the stairs. If you do not have them, ask someone to add them for you. What else can I do to help prevent falls? Wear shoes that: Do not have high heels. Have rubber bottoms. Are comfortable and fit you  well. Are closed at the toe. Do not wear sandals. If you use a stepladder: Make sure that it is fully opened. Do not climb a closed stepladder. Make sure that both sides of the stepladder are locked into place. Ask someone to hold it for you, if possible. Clearly mark and make sure that you can see: Any grab bars or handrails. First and last steps. Where the edge of each step is. Use tools that help you move around (mobility aids) if they are needed. These include: Canes. Walkers. Scooters. Crutches. Turn on the lights when you go into a dark area. Replace any light bulbs as soon as they burn out. Set up your furniture so you have a clear path. Avoid moving your furniture around. If any of your floors are uneven, fix them. If there are any pets around you, be aware of where they are. Review your medicines with your doctor. Some medicines can make you feel dizzy. This can increase your chance of falling. Ask your doctor what other things that you can do to help prevent falls. This information is not intended to replace advice given to you by your health care provider. Make sure you discuss any questions you have with your health care provider. Document Released: 07/01/2009 Document Revised: 02/10/2016 Document Reviewed: 10/09/2014 Elsevier Interactive Patient Education  2017 [employer].

## 2022-12-09 ENCOUNTER — Other Ambulatory Visit: Payer: Self-pay | Admitting: Cardiovascular Disease

## 2023-02-27 ENCOUNTER — Encounter: Payer: Self-pay | Admitting: Family Medicine

## 2023-02-27 ENCOUNTER — Ambulatory Visit: Payer: Medicare HMO | Admitting: Family Medicine

## 2023-02-27 VITALS — BP 124/78 | HR 52 | Ht 69.0 in | Wt 206.0 lb

## 2023-02-27 DIAGNOSIS — H6123 Impacted cerumen, bilateral: Secondary | ICD-10-CM | POA: Diagnosis not present

## 2023-02-27 DIAGNOSIS — Z1211 Encounter for screening for malignant neoplasm of colon: Secondary | ICD-10-CM | POA: Diagnosis not present

## 2023-02-27 DIAGNOSIS — E785 Hyperlipidemia, unspecified: Secondary | ICD-10-CM | POA: Diagnosis not present

## 2023-02-27 MED ORDER — ATORVASTATIN CALCIUM 80 MG PO TABS: ORAL_TABLET | ORAL | 1 refills | Status: DC

## 2023-02-27 NOTE — Progress Notes (Signed)
Date:  02/27/2023   Name:  George Doyle   DOB:  [birth year]   MRN:  [id number]   Chief Complaint: Hyperlipidemia  Hyperlipidemia This is a chronic problem. The current episode started more than 1 year ago. The problem is controlled. Recent lipid tests were reviewed and are normal. He has no history of chronic renal disease, diabetes, hypothyroidism, liver disease, obesity or nephrotic syndrome. Pertinent negatives include no chest pain, focal sensory loss, focal weakness, leg pain, myalgias or shortness of breath. Current antihyperlipidemic treatment includes statins. The current treatment provides moderate improvement of lipids. There are no compliance problems.  Risk factors for coronary artery disease include dyslipidemia.    Lab Results  Component Value Date   NA 134 05/03/2022   K 4.5 05/03/2022   CO2 22 05/03/2022   GLUCOSE 110 (H) 05/03/2022   BUN 17 05/03/2022   CREATININE 1.20 05/03/2022   CALCIUM 9.6 05/03/2022   EGFR 64 05/03/2022   GFRNONAA 53 (L) 07/15/2021   Lab Results  Component Value Date   CHOL 101 05/03/2022   HDL 58 05/03/2022   LDLCALC 29 05/03/2022   TRIG 65 05/03/2022   CHOLHDL 2.1 06/11/2018   No results found for: "TSH" No results found for: "HGBA1C" Lab Results  Component Value Date   WBC 8.3 08/01/2021   HGB 13.3 08/01/2021   HCT 39.4 08/01/2021   MCV 90 08/01/2021   PLT 292 08/01/2021   Lab Results  Component Value Date   ALT 10 05/03/2022   AST 18 05/03/2022   ALKPHOS 90 05/03/2022   BILITOT 0.8 05/03/2022   No results found for: "25OHVITD2", "25OHVITD3", "VD25OH"   Review of Systems  Constitutional:  Negative for fatigue and fever.  Eyes:  Negative for visual disturbance.  Respiratory:  Negative for shortness of breath, wheezing and stridor.   Cardiovascular:  Negative for chest pain, palpitations and leg swelling.  Gastrointestinal:  Negative for abdominal distention, blood in stool and constipation.   Musculoskeletal:  Negative for myalgias.  Neurological:  Negative for focal weakness.    Patient Active Problem List   Diagnosis Date Noted   Umbilical hernia without obstruction and without gangrene    Benign neoplasm of skin of left forearm 01/27/2019   Pure hypercholesterolemia 06/21/2018   Poorly-controlled hypertension 06/21/2018   Seizure disorder (HCC) 06/21/2018   Hill-Sachs lesion of right shoulder 06/19/2018   Injury of tendon of long head of right biceps 06/19/2018   Rotator cuff tendinitis, right 06/19/2018   Traumatic complete tear of right rotator cuff 06/19/2018   Headache disorder 05/09/2018   CAD (coronary artery disease), native coronary artery 03/28/2018   History of seizures 03/28/2018   Subdural hematoma (HCC) 03/27/2018   Vasovagal syncope 03/27/2018   Old MI (myocardial infarction) 08/30/2017   CAD, multiple vessel 08/01/2017   NSTEMI (non-ST elevated myocardial infarction) (HCC) 07/20/2017   Hyperlipidemia 05/10/2017   Preop cardiovascular exam    Benign neoplasm of ascending colon    Polyp of sigmoid colon    Essential hypertension 03/03/2016    No Known Allergies  Past Surgical History:  Procedure Laterality Date   BICEPT TENODESIS Right 07/18/2018   Procedure: BICEPS TENODESIS;  Surgeon: Christena Flake, MD;  Location: [facility];  Service: Orthopedics;  Laterality: Right;   COLONOSCOPY  2011   cleared for 10 yrs   COLONOSCOPY WITH PROPOFOL N/A 11/06/2016   Procedure: COLONOSCOPY WITH PROPOFOL;  Surgeon: Midge Minium, MD;  Location: [facility];  Service:  Endoscopy;  Laterality: N/A;   CORONARY STENT INTERVENTION N/A 07/23/2017   Procedure: CORONARY STENT INTERVENTION;  Surgeon: Alwyn Pea, MD;  Location: [facility];  Service: Cardiovascular;  Laterality: N/A;   CYST EXCISION     Forearm   INGUINAL HERNIA REPAIR Left    INSERTION OF MESH  05/25/2022   Procedure: INSERTION OF MESH;  Surgeon: Henrene Dodge, MD;  Location:  [facility];  Service: General;;   KNEE SURGERY Bilateral    2 on R) 1 on L)   LEFT HEART CATH AND CORONARY ANGIOGRAPHY N/A 07/23/2017   Procedure: LEFT HEART CATH AND CORONARY ANGIOGRAPHY;  Surgeon: Antonieta Iba, MD;  Location: [facility];  Service: Cardiovascular;  Laterality: N/A;   LEFT HEART CATH AND CORONARY ANGIOGRAPHY Left 10/23/2020   Procedure: LEFT HEART CATH AND CORONARY ANGIOGRAPHY; Location: [facility]   POLYPECTOMY  11/06/2016   Procedure: POLYPECTOMY;  Surgeon: Midge Minium, MD;  Location: [facility];  Service: Endoscopy;;   SHOULDER ARTHROSCOPY WITH OPEN ROTATOR CUFF REPAIR Right 07/18/2018   Procedure: SHOULDER ARTHROSCOPY WITH OPEN ROTATOR CUFF REPAIR;  Surgeon: Christena Flake, MD;  Location: [facility];  Service: Orthopedics;  Laterality: Right;    Social History   Tobacco Use   Smoking status: Former    Types: Cigarettes   Smokeless tobacco: Never   Tobacco comments:    quit over 20 yrs ago  Vaping Use   Vaping Use: Never used  Substance Use Topics   Alcohol use: Yes    Alcohol/week: 0.0 standard drinks of alcohol    Comment: 1-2 drinks/year   Drug use: No     Medication list has been reviewed and updated.  Current Meds  Medication Sig   acetaminophen (TYLENOL) 500 MG tablet Take 2 tablets (1,000 mg total) by mouth every 6 (six) hours as needed for mild pain.   aspirin EC 81 MG EC tablet Take 1 tablet (81 mg total) daily by mouth.   atorvastatin (LIPITOR) 80 MG tablet TAKE 1 TABLET(80 MG) BY MOUTH DAILY AT 6 PM   carvedilol (COREG) 6.25 MG tablet Take 1 tablet (6.25 mg total) by mouth 2 (two) times daily.   citalopram (CELEXA) 20 MG tablet Take 20 mg by mouth at bedtime.   donepezil (ARICEPT) 5 MG tablet Take 5 mg by mouth at bedtime.   EPINEPHrine 0.3 mg/0.3 mL IJ SOAJ injection Inject 0.3 mg into the muscle as needed for anaphylaxis.   fexofenadine (ALLEGRA) 180 MG tablet Take 180 mg by mouth as needed for allergies or rhinitis. otc    lamoTRIgine (LAMICTAL) 200 MG tablet Take 200 mg by mouth 2 (two) times daily.   latanoprost (XALATAN) 0.005 % ophthalmic solution Place 1 drop into both eyes at bedtime.   losartan (COZAAR) 50 MG tablet Take 1 tablet (50 mg total) by mouth 2 (two) times daily.   nitroGLYCERIN (NITROSTAT) 0.4 MG SL tablet PLACE 1 TABLET UNDER THE TONGUE EVERY 5 MINUTES FOR 3 DOSES AS NEEDED FOR CHEST PAIN   spironolactone (ALDACTONE) 25 MG tablet TAKE 1 TABLET BY MOUTH EVERY DAY       02/27/2023   10:42 AM 08/28/2022   10:32 AM 05/03/2022   11:36 AM 11/29/2021    1:24 PM  GAD 7 : Generalized Anxiety Score  Nervous, Anxious, on Edge 0 0 0 1  Control/stop worrying 0 0 0 0  Worry too much - different things 0 0 0 0  Trouble relaxing 0 0 0  0  Restless 0 0 0 0  Easily annoyed or irritable 0 0 0 0  Afraid - awful might happen 0 0 0 0  Total GAD 7 Score 0 0 0 1  Anxiety Difficulty Not difficult at all Not difficult at all Not difficult at all Not difficult at all       02/27/2023   10:42 AM 11/15/2022    2:01 PM 08/28/2022   10:32 AM  Depression screen PHQ 2/9  Decreased Interest 0 0 0  Down, Depressed, Hopeless 0 0 0  PHQ - 2 Score 0 0 0  Altered sleeping 0 0 0  Tired, decreased energy 0 0 0  Change in appetite 0 0 0  Feeling bad or failure about yourself  0 0 0  Trouble concentrating 0 0 0  Moving slowly or fidgety/restless 0 0 0  Suicidal thoughts 0 0 0  PHQ-9 Score 0 0 0  Difficult doing work/chores Not difficult at all Not difficult at all Not difficult at all    BP Readings from Last 3 Encounters:  02/27/23 124/78  08/28/22 128/64  06/08/22 (!) 150/71    Physical Exam Vitals and nursing note reviewed.  HENT:     Head: Normocephalic.     Right Ear: External ear normal. There is impacted cerumen.     Left Ear: External ear normal. There is impacted cerumen.     Nose: Nose normal. No congestion or rhinorrhea.     Mouth/Throat:     Mouth: Mucous membranes are moist.  Eyes:      General: No scleral icterus.       Right eye: No discharge.        Left eye: No discharge.     Conjunctiva/sclera: Conjunctivae normal.     Pupils: Pupils are equal, round, and reactive to light.  Neck:     Thyroid: No thyromegaly.     Vascular: No JVD.     Trachea: No tracheal deviation.  Cardiovascular:     Rate and Rhythm: Normal rate and regular rhythm.     Heart sounds: Normal heart sounds. No murmur heard.    No friction rub. No gallop.  Pulmonary:     Effort: No respiratory distress.     Breath sounds: Normal breath sounds. No wheezing or rales.  Abdominal:     General: Bowel sounds are normal.     Palpations: Abdomen is soft. There is no mass.     Tenderness: There is no abdominal tenderness. There is no guarding or rebound.  Musculoskeletal:        General: No tenderness. Normal range of motion.     Cervical back: Normal range of motion and neck supple.  Lymphadenopathy:     Cervical: No cervical adenopathy.  Skin:    General: Skin is warm.     Findings: No rash.  Neurological:     Mental Status: He is alert and oriented to person, place, and time.     Cranial Nerves: No cranial nerve deficit.     Deep Tendon Reflexes: Reflexes are normal and symmetric.     Wt Readings from Last 3 Encounters:  02/27/23 206 lb (93.4 kg)  11/15/22 210 lb (95.3 kg)  08/28/22 210 lb (95.3 kg)    BP 124/78   Pulse (!) 52   Ht 5\' 9"  (1.753 m)   Wt 206 lb (93.4 kg)   SpO2 96%   BMI 30.42 kg/m   Assessment and Plan:  1. Hyperlipidemia, unspecified  hyperlipidemia type Chronic.  Controlled.  Stable.  Continue atorvastatin 80 mg once a day.  Will check lipid panel for current status of LDL control and CMP for electrolytes and hepatic profile. - atorvastatin (LIPITOR) 80 MG tablet; TAKE 1 TABLET(80 MG) BY MOUTH DAILY AT 6 PM  Dispense: 90 tablet; Refill: 1 - Comprehensive Metabolic Panel (CMET) - Lipid Panel With LDL/HDL Ratio  2. Bilateral impacted cerumen Decreased hearing was  noted and bilateral cerumen impaction was observed.  We will refer to ear nose and throat for cleaning out and perhaps further evaluation of decreased hearing of patient. - Ambulatory referral to ENT  3. Colon cancer screening Discussed with patient and referral made to gastroenterology for colon cancer screening. - Ambulatory referral to Gastroenterology    Elizabeth Sauer, MD

## 2023-02-28 ENCOUNTER — Ambulatory Visit: Payer: Self-pay | Admitting: *Deleted

## 2023-02-28 LAB — COMPREHENSIVE METABOLIC PANEL
ALT: 9 IU/L (ref 0–44)
AST: 19 IU/L (ref 0–40)
Albumin/Globulin Ratio: 1.9
Albumin: 4.2 g/dL (ref 3.8–4.8)
Alkaline Phosphatase: 89 IU/L (ref 44–121)
BUN/Creatinine Ratio: 18 (ref 10–24)
BUN: 19 mg/dL (ref 8–27)
Bilirubin Total: 0.8 mg/dL (ref 0.0–1.2)
CO2: 25 mmol/L (ref 20–29)
Calcium: 9.8 mg/dL (ref 8.6–10.2)
Chloride: 100 mmol/L (ref 96–106)
Creatinine, Ser: 1.07 mg/dL (ref 0.76–1.27)
Globulin, Total: 2.2 g/dL (ref 1.5–4.5)
Glucose: 94 mg/dL (ref 70–99)
Potassium: 4.6 mmol/L (ref 3.5–5.2)
Sodium: 136 mmol/L (ref 134–144)
Total Protein: 6.4 g/dL (ref 6.0–8.5)
eGFR: 73 mL/min/{1.73_m2} (ref >59)

## 2023-02-28 LAB — LIPID PANEL WITH LDL/HDL RATIO
Cholesterol, Total: 111 mg/dL (ref 100–199)
HDL: 59 mg/dL (ref >39)
LDL Chol Calc (NIH): 39 mg/dL (ref 0–99)
LDL/HDL Ratio: 0.7 ratio (ref 0.0–3.6)
Triglycerides: 57 mg/dL (ref 0–149)
VLDL Cholesterol Cal: 13 mg/dL (ref 5–40)

## 2023-02-28 NOTE — Telephone Encounter (Signed)
Reason for Disposition  [1] Follow-up call to recent contact AND [2] information only call, no triage required  Answer Assessment - Initial Assessment Questions 1. REASON FOR CALL or QUESTION: "What is your reason for calling today?" or "How can I best help you?" or "What question do you have that I can help answer?"     Pt given lab results per notes of Dr. Elizabeth Sauer on 8:31 AM.   Pt verbalized understanding.  Protocols used: Information Only Call - No Triage-A-AH

## 2023-02-28 NOTE — Telephone Encounter (Signed)
  Chief Complaint: Pt called in for his lab results.   Message given from Dr. Yetta Barre. Symptoms: N/A Frequency: N/A Pertinent Negatives: Patient denies N/A Disposition: [] ED /[] Urgent Care (no appt availability in office) / [] Appointment(In office/virtual)/ []  [facility]/ [x] Home Care/ [] Refused Recommended Disposition /[] [facility]/ []  Follow-up with PCP Additional Notes:

## 2023-02-28 NOTE — Progress Notes (Signed)
PC to pt, LVM to call back [facility] may give results, CRM created

## 2023-03-01 ENCOUNTER — Telehealth: Payer: Self-pay

## 2023-03-01 ENCOUNTER — Other Ambulatory Visit: Payer: Self-pay

## 2023-03-01 DIAGNOSIS — Z86010 Personal history of colonic polyps: Secondary | ICD-10-CM

## 2023-03-01 MED ORDER — NA SULFATE-K SULFATE-MG SULF 17.5-3.13-1.6 GM/177ML PO SOLN: 1.0000 | Freq: Once | ORAL | 0 refills | Status: AC

## 2023-03-01 NOTE — Telephone Encounter (Signed)
Gastroenterology Pre-Procedure Review  Request Date: 05/01/23 Requesting Physician: Dr. Servando Snare  PATIENT REVIEW QUESTIONS: The patient responded to the following health history questions as indicated:    1. Are you having any GI issues? no 2. Do you have a personal history of Polyps? yes (11/06/2016  colonoscopy performed by Dr. Servando Snare polyps were noted) 3. Do you have a family history of Colon Cancer or Polyps? no 4. Diabetes Mellitus? no 5. Joint replacements in the past 12 months?no 6. Major health problems in the past 3 months?no 7. Any artificial heart valves, MVP, or defibrillator?no 8. Cardiac issues? Yes CAD, NSTEMI, Old MI clearance sent to [facility] PREOP     MEDICATIONS & ALLERGIES:    Patient reports the following regarding taking any anticoagulation/antiplatelet therapy:   Plavix, Coumadin, Eliquis, Xarelto, Lovenox, Pradaxa, Brilinta, or Effient? no Aspirin? Yes 81 mg daily  Patient confirms/reports the following medications:  Current Outpatient Medications  Medication Sig Dispense Refill   acetaminophen (TYLENOL) 500 MG tablet Take 2 tablets (1,000 mg total) by mouth every 6 (six) hours as needed for mild pain.     aspirin EC 81 MG EC tablet Take 1 tablet (81 mg total) daily by mouth. 30 tablet 1   atorvastatin (LIPITOR) 80 MG tablet TAKE 1 TABLET(80 MG) BY MOUTH DAILY AT 6 PM 90 tablet 1   carvedilol (COREG) 6.25 MG tablet Take 1 tablet (6.25 mg total) by mouth 2 (two) times daily. 180 tablet 1   citalopram (CELEXA) 20 MG tablet Take 20 mg by mouth at bedtime.     donepezil (ARICEPT) 5 MG tablet Take 5 mg by mouth at bedtime.     EPINEPHrine 0.3 mg/0.3 mL IJ SOAJ injection Inject 0.3 mg into the muscle as needed for anaphylaxis. 1 each 1   fexofenadine (ALLEGRA) 180 MG tablet Take 180 mg by mouth as needed for allergies or rhinitis. otc     lamoTRIgine (LAMICTAL) 200 MG tablet Take 200 mg by mouth 2 (two) times daily.     latanoprost (XALATAN) 0.005 % ophthalmic solution  Place 1 drop into both eyes at bedtime.     losartan (COZAAR) 50 MG tablet Take 1 tablet (50 mg total) by mouth 2 (two) times daily. 180 tablet 3   nitroGLYCERIN (NITROSTAT) 0.4 MG SL tablet PLACE 1 TABLET UNDER THE TONGUE EVERY 5 MINUTES FOR 3 DOSES AS NEEDED FOR CHEST PAIN 25 tablet 3   spironolactone (ALDACTONE) 25 MG tablet TAKE 1 TABLET BY MOUTH EVERY DAY 90 tablet 0   No current facility-administered medications for this visit.    Patient confirms/reports the following allergies:  No Known Allergies  No orders of the defined types were placed in this encounter.   AUTHORIZATION INFORMATION Primary Insurance: 1D#: Group #:  Secondary Insurance: 1D#: Group #:  SCHEDULE INFORMATION: Date: 05/01/23 Time: Location: [facility]

## 2023-03-02 ENCOUNTER — Telehealth: Payer: Self-pay | Admitting: *Deleted

## 2023-03-02 NOTE — Telephone Encounter (Signed)
Primary Cardiologist:Timothy Mariah Milling, MD  Chart reviewed as part of pre-operative protocol coverage. Because of Davari Ledon Jayson's past medical history and time since last visit, he/she will require a follow-up visit in order to better assess preoperative cardiovascular risk.  Pre-op covering staff: - Patient has an appointment on 03/05/23 with Dr. Mariah Milling at which time clearance will be addressed. Appointment notes have been updated. - Please contact requesting surgeon's office via preferred method (i.e, phone, fax) to inform them of need for appointment prior to surgery. Levi Aland, NP-C  03/02/2023, 8:14 AM [address], [address] Office [phone] Fax [phone]

## 2023-03-02 NOTE — Telephone Encounter (Signed)
PT SCHEDULED TO SEE DR. GOLLAN, 03/05/23... SURGEON'S OFFICE IS ALREADY AWARE AND APPOINTMENT HAS BEEN UPDATED.      Pre-operative Risk Assessment    Patient Name: George Doyle  DOB: May 30, 1951 MRN: [id number]      Request for Surgical Clearance    Procedure:   COLONOSCOPY  Date of Surgery:  Clearance 05/01/23                                 Surgeon:  DR. DARREN WOHL Surgeon's Group or Practice Name:  [facility] Phone number:  [phone] Fax number:  [phone]   Type of Clearance Requested:   - Medical  - Pharmacy:  Hold Aspirin NOT INDICATED HOW LONG   Type of Anesthesia:  General    Additional requests/questions:    [name]   03/02/2023, 7:07 AM

## 2023-03-04 NOTE — Progress Notes (Unsigned)
Cardiology Office Note  Date:  03/05/2023   ID:  George Doyle, DOB 07-02-1951, MRN [id number]  PCP:  George Limerick, MD   Chief Complaint  Patient presents with   12 month follow up     Cardiac clearance for a colonoscopy that is scheduled for August. Medications reviewed by the patient verbally.     HPI:  George Doyle is a pleasant 89 was [age]yo male with history of  Seizure disorder 2005 in July 2019 HTN  former tobacco abuse  admitted with chest pain 07/21/2017 NSTEMI, TNT 14 Cath 07/23/2017  , Occluded proximal RCA, collaterals from left to right, Moderate to severe mid Left circumflex dz, tortuous vessel, calcified Moderate inferior wall hypokinesis attempt PCI to the RCA was unsuccessful, possibly went subintimal Seen by Dr. Eldridge Dace August 30, 2017 Medical management recommended of his occluded RCA He presents for follow-up of his coronary artery disease, stable angina, preop cardiovascular  Last seen in clinic by myself 6/23 Seen by one of our providers for preoperative evaluation in August 2023 Umbilical hernia repair May 25, 2022  Scheduled for colonscopy in Aug 24  Reports feeling well, denies any near-syncope or syncope, no orthostasis symptoms Rare mechanical falls at home when working in the garden  Heart rate low today, asymptomatic, on carvedilol 6.25 twice daily  No regular exercise program History of arthritis  Lab work reviewed Total cholesterol 103, LDL 31  EKG personally reviewed by myself on todays visit Shows normal sinus rhythm rate 48 bpm ST abnormality V45 through V6, 1 and aVL old inferior MI, PR interval 264 ms  Other past medical history reviewed  emergency room March 27, 2018 for seizure  full body tonic-clonic seizure-like activity, hurt his right shoulder at that time CT scan head subdural hematoma, Received IV Keppra.   was transferred to [facility]  Echo November 2018  ejection fraction was in the range  of 50% to 55%. Aorta: Aortic root was mildly dilated, 3.6 cm - Ascending aorta: The ascending aorta was mildly dilated, 4.1 cm   PMH:   has a past medical history of Allergy, Arthritis, Ascending aorta dilatation (HCC) (07/21/2017), Coronary artery disease, Diastolic dysfunction (07/21/2017), Dyspnea, Former tobacco use, History of 2019 novel coronavirus disease (COVID-19) (10/21/2020), HLD (hyperlipidemia), Hypertension, NSTEMI (non-ST elevated myocardial infarction) (HCC) (07/20/2017), NSTEMI (non-ST elevated myocardial infarction) (HCC) (10/23/2020), Pneumonia, SDH (subdural hematoma) (HCC) (03/27/2018), Seizures (HCC) (1970), Umbilical hernia, and Vasovagal syncope.  PSH:    Past Surgical History:  Procedure Laterality Date   BICEPT TENODESIS Right 07/18/2018   Procedure: BICEPS TENODESIS;  Surgeon: Christena Flake, MD;  Location: [facility] ORS;  Service: Orthopedics;  Laterality: Right;   COLONOSCOPY  2011   cleared for 10 yrs   COLONOSCOPY WITH PROPOFOL N/A 11/06/2016   Procedure: COLONOSCOPY WITH PROPOFOL;  Surgeon: Midge Minium, MD;  Location: [facility];  Service: Endoscopy;  Laterality: N/A;   CORONARY STENT INTERVENTION N/A 07/23/2017   Procedure: CORONARY STENT INTERVENTION;  Surgeon: Alwyn Pea, MD;  Location: [facility] INV[facility] CV LAB;  Service: Cardiovascular;  Laterality: N/A;   CYST EXCISION     Forearm   INGUINAL HERNIA REPAIR Left    INSERTION OF MESH  05/25/2022   Procedure: INSERTION OF MESH;  Surgeon: Henrene Dodge, MD;  Location: [facility] ORS;  Service: General;;   KNEE SURGERY Bilateral    2 on R) 1 on L)   LEFT HEART CATH AND CORONARY ANGIOGRAPHY N/A 07/23/2017   Procedure: LEFT HEART CATH  AND CORONARY ANGIOGRAPHY;  Surgeon: Antonieta Iba, MD;  Location: [facility] INVASIVE CV LAB;  Service: Cardiovascular;  Laterality: N/A;   LEFT HEART CATH AND CORONARY ANGIOGRAPHY Left 10/23/2020   Procedure: LEFT HEART CATH AND CORONARY ANGIOGRAPHY; Location: [facility]   POLYPECTOMY   11/06/2016   Procedure: POLYPECTOMY;  Surgeon: Midge Minium, MD;  Location: [facility];  Service: Endoscopy;;   SHOULDER ARTHROSCOPY WITH OPEN ROTATOR CUFF REPAIR Right 07/18/2018   Procedure: SHOULDER ARTHROSCOPY WITH OPEN ROTATOR CUFF REPAIR;  Surgeon: Christena Flake, MD;  Location: [facility] ORS;  Service: Orthopedics;  Laterality: Right;    Current Outpatient Medications  Medication Sig Dispense Refill   acetaminophen (TYLENOL) 500 MG tablet Take 2 tablets (1,000 mg total) by mouth every 6 (six) hours as needed for mild pain.     aspirin EC 81 MG EC tablet Take 1 tablet (81 mg total) daily by mouth. 30 tablet 1   atorvastatin (LIPITOR) 80 MG tablet TAKE 1 TABLET(80 MG) BY MOUTH DAILY AT 6 PM 90 tablet 1   carvedilol (COREG) 6.25 MG tablet Take 1 tablet (6.25 mg total) by mouth 2 (two) times daily. 180 tablet 1   citalopram (CELEXA) 20 MG tablet Take 20 mg by mouth at bedtime.     donepezil (ARICEPT) 5 MG tablet Take 5 mg by mouth at bedtime.     fexofenadine (ALLEGRA) 180 MG tablet Take 180 mg by mouth as needed for allergies or rhinitis. otc     lamoTRIgine (LAMICTAL) 200 MG tablet Take 200 mg by mouth 2 (two) times daily.     latanoprost (XALATAN) 0.005 % ophthalmic solution Place 1 drop into both eyes at bedtime.     losartan (COZAAR) 50 MG tablet Take 1 tablet (50 mg total) by mouth 2 (two) times daily. 180 tablet 3   nitroGLYCERIN (NITROSTAT) 0.4 MG SL tablet PLACE 1 TABLET UNDER THE TONGUE EVERY 5 MINUTES FOR 3 DOSES AS NEEDED FOR CHEST PAIN 25 tablet 3   spironolactone (ALDACTONE) 25 MG tablet TAKE 1 TABLET BY MOUTH EVERY DAY 90 tablet 0   EPINEPHrine 0.3 mg/0.3 mL IJ SOAJ injection Inject 0.3 mg into the muscle as needed for anaphylaxis. (Patient not taking: Reported on 03/05/2023) 1 each 1   No current facility-administered medications for this visit.    Allergies:   Patient has no known allergies.   Social History:  The patient  reports that he has quit smoking. His smoking  use included cigarettes. He has never used smokeless tobacco. He reports current alcohol use. He reports that he does not use drugs.   Family History:   family history includes CAD in his father; Heart failure in his mother; Lung cancer in his father.   Review of Systems: Review of Systems  Constitutional: Negative.   Respiratory:  Positive for shortness of breath.   Cardiovascular: Negative.   Gastrointestinal: Negative.   Musculoskeletal: Negative.   Neurological: Negative.   Psychiatric/Behavioral: Negative.    All other systems reviewed and are negative.  PHYSICAL EXAM: VS:  BP 120/60 (BP Location: Left Arm, Patient Position: Sitting, Cuff Size: Normal)   Pulse (!) 48   Ht 5\' 9"  (1.753 m)   Wt 208 lb (94.3 kg)   SpO2 97%   BMI 30.72 kg/m  , BMI Body mass index is 30.72 kg/m. Constitutional:  oriented to person, place, and time. No distress.  HENT:  Head: Grossly normal Eyes:  no discharge. No scleral icterus.  Neck: No JVD, no carotid  bruits  Cardiovascular: Regular rate and rhythm, no murmurs appreciated Pulmonary/Chest: Clear to auscultation bilaterally, no wheezes or rails Abdominal: Soft.  no distension.  no tenderness.  Musculoskeletal: Normal range of motion Neurological:  normal muscle tone. Coordination normal. No atrophy Skin: Skin warm and dry Psychiatric: normal affect, pleasant    Recent Labs: 02/27/2023: ALT 9; BUN 19; Creatinine, Ser 1.07; Potassium 4.6; Sodium 136    Lipid Panel Lab Results  Component Value Date   CHOL 111 02/27/2023   HDL 59 02/27/2023   LDLCALC 39 02/27/2023   TRIG 57 02/27/2023    Wt Readings from Last 3 Encounters:  03/05/23 208 lb (94.3 kg)  02/27/23 206 lb (93.4 kg)  11/15/22 210 lb (95.3 kg)     ASSESSMENT AND PLAN:  Essential hypertension  Recommend he decrease carvedilol down to 3.125 twice daily given bradycardia and prolonged PR Recommend he closely monitor heart rate at home and call us with numbers in the  next several weeks Ideally would like heart rates in the 50s for colonoscopy in August  Preop cardiovascular evaluation Acceptable risk for colonoscopy August 2024 Decreasing doses of carvedilol today given bradycardia  CAD, multiple vessel Currently with no symptoms of angina. No further workup at this time. Continue current medication regimen. Moderate to severe left circumflex disease tortuous vessel, occluded RCA Prior catheterization November 2018 Cholesterol at goal, nondiabetic, non-smoker Denies unstable angina symptoms  Mixed hyperlipidemia Cholesterol is at goal on the current lipid regimen. No changes to the medications were made.  Seizures Previous hospital admissions discussed with him from July 2019 No recent seizures Followed by Dr. Malvin Johns   Total encounter time more than 30 minutes  Greater than 50% was spent in counseling and coordination of care with the patient  No orders of the defined types were placed in this encounter.    Signed, Dossie Arbour, M.D., Ph.D. 03/05/2023  [facility] [place], [place] [phone]

## 2023-03-05 ENCOUNTER — Ambulatory Visit: Payer: Medicare HMO | Attending: Cardiovascular Disease | Admitting: Cardiovascular Disease

## 2023-03-05 ENCOUNTER — Encounter: Payer: Self-pay | Admitting: Cardiovascular Disease

## 2023-03-05 VITALS — BP 120/60 | HR 48 | Ht 69.0 in | Wt 208.0 lb

## 2023-03-05 DIAGNOSIS — E785 Hyperlipidemia, unspecified: Secondary | ICD-10-CM

## 2023-03-05 DIAGNOSIS — I5020 Unspecified systolic (congestive) heart failure: Secondary | ICD-10-CM

## 2023-03-05 DIAGNOSIS — I77819 Aortic ectasia, unspecified site: Secondary | ICD-10-CM | POA: Diagnosis not present

## 2023-03-05 DIAGNOSIS — I10 Essential (primary) hypertension: Secondary | ICD-10-CM | POA: Diagnosis not present

## 2023-03-05 DIAGNOSIS — I2510 Atherosclerotic heart disease of native coronary artery without angina pectoris: Secondary | ICD-10-CM | POA: Diagnosis not present

## 2023-03-05 MED ORDER — NITROGLYCERIN 0.4 MG SL SUBL: SUBLINGUAL_TABLET | SUBLINGUAL | 3 refills | Status: DC

## 2023-03-05 MED ORDER — CARVEDILOL 6.25 MG PO TABS: 3.1250 mg | ORAL_TABLET | Freq: Two times a day (BID) | ORAL | 3 refills | Status: DC

## 2023-03-05 MED ORDER — LOSARTAN POTASSIUM 50 MG PO TABS
50.0000 mg | ORAL_TABLET | Freq: Two times a day (BID) | ORAL | 3 refills | Status: AC
Start: 2023-03-05 — End: ?

## 2023-03-05 MED ORDER — SPIRONOLACTONE 25 MG PO TABS: 25.0000 mg | ORAL_TABLET | Freq: Every day | ORAL | 3 refills | Status: DC

## 2023-03-05 MED ORDER — ATORVASTATIN CALCIUM 80 MG PO TABS
ORAL_TABLET | ORAL | 3 refills | Status: DC
Start: 2023-03-05 — End: 2024-04-11

## 2023-03-05 NOTE — Patient Instructions (Addendum)
Medication Instructions:  Please decrease the coreg down to 3.125 mg twice a day (1/2 of the 6.25 pill)  Monitor heart rate  Goal >55  If you need a refill on your cardiac medications before your next appointment, please call your pharmacy.   Lab work: No new labs needed  Testing/Procedures: No new testing needed  Follow-Up: At [facility], you and your health needs are our priority.  As part of our continuing mission to provide you with exceptional heart care, we have created designated Provider Care Teams.  These Care Teams include your primary Cardiologist (physician) and Advanced Practice Providers (APPs -  Physician Assistants and Nurse Practitioners) who all work together to provide you with the care you need, when you need it.  You will need a follow up appointment in 12 months  Providers on your designated Care Team:   Nicolasa Ducking, NP Eula Listen, PA-C Cadence Fransico [place], [place]  COVID-19 Vaccine Information can be found at: [email or web address] For questions related to vaccine distribution or appointments, please email [email or web address] or call [phone].

## 2023-03-07 ENCOUNTER — Encounter: Payer: Self-pay | Admitting: Cardiovascular Disease

## 2023-03-09 ENCOUNTER — Telehealth: Payer: Self-pay

## 2023-03-09 NOTE — Telephone Encounter (Signed)
Per Dr. Windell Hummingbird office visit noted 03/05/23 "Preop cardiovascular evaluation Acceptable risk for colonoscopy August 2024 Decreasing doses of carvedilol today given bradycardia"  Thanks, [place], [place]

## 2023-03-15 ENCOUNTER — Telehealth: Payer: Self-pay

## 2023-03-15 NOTE — Telephone Encounter (Signed)
Spouse Okey Regal called regarding the current prep sent to pharmacy... Ok per [employer]... Pt is concerned about having issues with keeping prep down... I suggested trying Clenpiq, it is not preferred for insurance so I will provide a sample to pick up in the afternoon July 8th the next time I will be in [facility]

## 2023-04-03 MED ORDER — CARVEDILOL 3.125 MG PO TABS: 3.1250 mg | ORAL_TABLET | Freq: Two times a day (BID) | ORAL | 3 refills | Status: DC

## 2023-04-30 ENCOUNTER — Encounter: Payer: Self-pay | Admitting: Gastroenterology

## 2023-05-01 ENCOUNTER — Ambulatory Visit
Admission: RE | Admit: 2023-05-01 | Discharge: 2023-05-01 | Disposition: A | Discharge status: Home / Self Care | Payer: Medicare HMO | Attending: Gastroenterology | Admitting: Gastroenterology

## 2023-05-01 ENCOUNTER — Ambulatory Visit: Payer: Medicare HMO | Admitting: Anesthesiology

## 2023-05-01 ENCOUNTER — Encounter: Payer: Self-pay | Admitting: Gastroenterology

## 2023-05-01 ENCOUNTER — Encounter
Admission: RE | Disposition: A | Discharge status: Home / Self Care | Payer: Self-pay | Source: Home / Self Care | Attending: Gastroenterology

## 2023-05-01 DIAGNOSIS — Z860100 Personal history of colon polyps, unspecified: Secondary | ICD-10-CM

## 2023-05-01 DIAGNOSIS — R569 Unspecified convulsions: Secondary | ICD-10-CM | POA: Diagnosis not present

## 2023-05-01 DIAGNOSIS — I2510 Atherosclerotic heart disease of native coronary artery without angina pectoris: Secondary | ICD-10-CM | POA: Insufficient documentation

## 2023-05-01 DIAGNOSIS — Z87891 Personal history of nicotine dependence: Secondary | ICD-10-CM | POA: Diagnosis not present

## 2023-05-01 DIAGNOSIS — Z1211 Encounter for screening for malignant neoplasm of colon: Secondary | ICD-10-CM | POA: Insufficient documentation

## 2023-05-01 DIAGNOSIS — I252 Old myocardial infarction: Secondary | ICD-10-CM | POA: Insufficient documentation

## 2023-05-01 DIAGNOSIS — I10 Essential (primary) hypertension: Secondary | ICD-10-CM | POA: Insufficient documentation

## 2023-05-01 HISTORY — PX: COLONOSCOPY WITH PROPOFOL: SHX5780

## 2023-05-01 SURGERY — COLONOSCOPY WITH PROPOFOL
Anesthesia: General

## 2023-05-01 MED ORDER — PROPOFOL 10 MG/ML IV BOLUS
INTRAVENOUS | Status: DC | PRN
Start: 2023-05-01 — End: 2023-05-01
  Administered 2023-05-01: 50 mg via INTRAVENOUS

## 2023-05-01 MED ORDER — LIDOCAINE HCL (CARDIAC) PF 100 MG/5ML IV SOSY
PREFILLED_SYRINGE | INTRAVENOUS | Status: DC | PRN
  Administered 2023-05-01: 40 mg via INTRAVENOUS

## 2023-05-01 MED ORDER — PROPOFOL 500 MG/50ML IV EMUL
INTRAVENOUS | Status: DC | PRN
  Administered 2023-05-01: 120 ug/kg/min via INTRAVENOUS

## 2023-05-01 MED ORDER — PROPOFOL 10 MG/ML IV BOLUS
INTRAVENOUS | Status: AC
  Filled 2023-05-01: qty 20

## 2023-05-01 MED ORDER — SODIUM CHLORIDE 0.9 % IV SOLN
INTRAVENOUS | Status: DC
  Administered 2023-05-01: 1000 mL via INTRAVENOUS

## 2023-05-01 NOTE — H&P (Signed)
Midge Minium, MD [facility] [address], [address] [place], [place] [address] Phone:[phone] Fax : [phone]  Primary Care Physician:  Duanne Limerick, MD Primary Gastroenterologist:  Dr. Servando Snare  Pre-Procedure History & Physical: HPI:  George Doyle is a 72 y.o. male is here for an colonoscopy.   Past Medical History:  Diagnosis Date   Allergy    Arthritis    knees, hands   Ascending aorta dilatation (HCC) 07/21/2017   a.) TTE 07/21/2017: Ao root 36 mm, asc Ao 41 mm; b.) TTE 10/22/2020: asc Ao 39 mm   Coronary artery disease    Diastolic dysfunction 07/21/2017   a.) TTE 07/21/2017 (following NSTEMI): EF 50-55%, inf HK, mild MR, G1DD   Dyspnea    Former tobacco use    History of 2019 novel coronavirus disease (COVID-19) 10/21/2020   HLD (hyperlipidemia)    Hypertension    NSTEMI (non-ST elevated myocardial infarction) (HCC) 07/20/2017   a.) CP started 5 days prior to presentation (approx 07/15/2017); b.) LHC 07/23/2017: 100% p-d RCA, 60% p-mLCx (tortuous), 99% post atrio --> unsuccessful PCI; c.) referred to interventionist Eldridge Dace) in [place] on 08/30/2017 --> med mgmt recommended   NSTEMI (non-ST elevated myocardial infarction) (HCC) 10/23/2020   a.) Troponins trended: 307-->1304--> 6774 --> 6895 --> 5886 --> 5324; b.) LHC 10/23/2020: EF >55%; 40% pLCx, CTO pRCA --> med mgmt.   Pneumonia    SDH (subdural hematoma) (HCC) 03/27/2018   a.) 6 mm frontal SDH s/p fall related to tonic-clonic seizure   Seizures (HCC) 1970   a.) last documented seizure was in 10/2020   Umbilical hernia    Vasovagal syncope     Past Surgical History:  Procedure Laterality Date   BICEPT TENODESIS Right 07/18/2018   Procedure: BICEPS TENODESIS;  Surgeon: Christena Flake, MD;  Location: [facility];  Service: Orthopedics;  Laterality: Right;   CARDIAC CATHETERIZATION     COLONOSCOPY  2011   cleared for 10 yrs   COLONOSCOPY WITH PROPOFOL N/A 11/06/2016   Procedure: COLONOSCOPY WITH PROPOFOL;   Surgeon: Midge Minium, MD;  Location: [facility];  Service: Endoscopy;  Laterality: N/A;   CORONARY STENT INTERVENTION N/A 07/23/2017   Procedure: CORONARY STENT INTERVENTION;  Surgeon: Alwyn Pea, MD;  Location: [facility];  Service: Cardiovascular;  Laterality: N/A;   CYST EXCISION     Forearm   INGUINAL HERNIA REPAIR Left    INSERTION OF MESH  05/25/2022   Procedure: INSERTION OF MESH;  Surgeon: Henrene Dodge, MD;  Location: [facility];  Service: General;;   KNEE SURGERY Bilateral    2 on R) 1 on L)   LEFT HEART CATH AND CORONARY ANGIOGRAPHY N/A 07/23/2017   Procedure: LEFT HEART CATH AND CORONARY ANGIOGRAPHY;  Surgeon: Antonieta Iba, MD;  Location: [facility];  Service: Cardiovascular;  Laterality: N/A;   LEFT HEART CATH AND CORONARY ANGIOGRAPHY Left 10/23/2020   Procedure: LEFT HEART CATH AND CORONARY ANGIOGRAPHY; Location: [facility]   POLYPECTOMY  11/06/2016   Procedure: POLYPECTOMY;  Surgeon: Midge Minium, MD;  Location: [facility];  Service: Endoscopy;;   SHOULDER ARTHROSCOPY WITH OPEN ROTATOR CUFF REPAIR Right 07/18/2018   Procedure: SHOULDER ARTHROSCOPY WITH OPEN ROTATOR CUFF REPAIR;  Surgeon: Christena Flake, MD;  Location: [facility] OR[facility];  Service: Orthopedics;  Laterality: Right;    Prior to Admission medications   Medication Sig Start Date End Date Taking? Authorizing Provider  aspirin EC 81 MG EC tablet Take 1 tablet (81 mg  total) daily by mouth. 07/24/17  Yes Enid Baas, MD  atorvastatin (LIPITOR) 80 MG tablet TAKE 1 TABLET(80 MG) BY MOUTH DAILY AT 6 PM 03/05/23  Yes Gollan, Tollie Pizza, MD  carvedilol (COREG) 3.125 MG tablet Take 1 tablet (3.125 mg total) by mouth 2 (two) times daily. 04/03/23  Yes Gollan, Tollie Pizza, MD  citalopram (CELEXA) 20 MG tablet Take 20 mg by mouth at bedtime.   Yes [provider]  donepezil (ARICEPT) 5 MG tablet Take 5 mg by mouth at bedtime.   Yes [provider]  fexofenadine (ALLEGRA) 180 MG  tablet Take 180 mg by mouth as needed for allergies or rhinitis. otc   Yes [provider]  lamoTRIgine (LAMICTAL) 200 MG tablet Take 200 mg by mouth 2 (two) times daily.   Yes [provider]  latanoprost (XALATAN) 0.005 % ophthalmic solution Place 1 drop into both eyes at bedtime. 11/05/21  Yes [provider]  losartan (COZAAR) 50 MG tablet Take 1 tablet (50 mg total) by mouth 2 (two) times daily. 03/05/23  Yes Antonieta Iba, MD  spironolactone (ALDACTONE) 25 MG tablet Take 1 tablet (25 mg total) by mouth daily. 03/05/23  Yes Gollan, Tollie Pizza, MD  acetaminophen (TYLENOL) 500 MG tablet Take 2 tablets (1,000 mg total) by mouth every 6 (six) hours as needed for mild pain. 05/25/22   Piscoya, Elita Quick, MD  EPINEPHrine 0.3 mg/0.3 mL IJ SOAJ injection Inject 0.3 mg into the muscle as needed for anaphylaxis. Patient not taking: Reported on 03/05/2023 08/01/21   Duanne Limerick, MD  nitroGLYCERIN (NITROSTAT) 0.4 MG SL tablet PLACE 1 TABLET UNDER THE TONGUE EVERY 5 MINUTES FOR 3 DOSES AS NEEDED FOR CHEST PAIN 03/05/23   Antonieta Iba, MD    Allergies as of 03/02/2023   (No Known Allergies)    Family History  Problem Relation Age of Onset   CAD Father    Lung cancer Father    Heart failure Mother     Social History   Socioeconomic History   Marital status: Married    Spouse name: Okey Regal   Number of children: 2   Years of education: Not on file   Highest education level: Not on file  Occupational History   Not on file  Tobacco Use   Smoking status: Former    Types: Cigarettes   Smokeless tobacco: Never   Tobacco comments:    quit over 20 yrs ago  Vaping Use   Vaping status: Never Used  Substance and Sexual Activity   Alcohol use: Yes    Alcohol/week: 0.0 standard drinks of alcohol    Comment: 1-2 drinks/year   Drug use: No   Sexual activity: Not Currently  Other Topics Concern   Not on file  Social History Narrative   Not on file   Social  Determinants of Health   Financial Resource Strain: Low Risk  (11/15/2022)   Overall Financial Resource Strain (CARDIA)    Difficulty of Paying Living Expenses: Not hard at all  Food Insecurity: No Food Insecurity (11/15/2022)   Hunger Vital Sign    Worried About Running Out of Food in the Last Year: Never true    Ran Out of Food in the Last Year: Never true  Transportation Needs: No Transportation Needs (11/15/2022)   PRAPARE - Administrator, Civil Service (Medical): No    Lack of Transportation (Non-Medical): No  Physical Activity: Sufficiently Active (11/15/2022)   Exercise Vital Sign  Days of Exercise per Week: 7 days    Minutes of Exercise per Session: 30 min  Stress: No Stress Concern Present (11/15/2022)   [employer] of Occupational Health - Occupational Stress Questionnaire    Feeling of Stress : Not at all  Social Connections: Moderately Isolated (11/15/2022)   Social Connection and Isolation Panel [NHANES]    Frequency of Communication with Friends and Family: Once a week    Frequency of Social Gatherings with Friends and Family: More than three times a week    Attends Religious Services: Never    Database administrator or Organizations: No    Attends Banker Meetings: Never    Marital Status: Married  Catering manager Violence: Not At Risk (11/15/2022)   Humiliation, Afraid, Rape, and Kick questionnaire    Fear of Current or Ex-Partner: No    Emotionally Abused: No    Physically Abused: No    Sexually Abused: No    Review of Systems: See HPI, otherwise negative ROS  Physical Exam: BP (!) 153/72   Pulse (!) 56   Temp (!) 97.2 F (36.2 C) (Temporal)   Resp 18   Ht 5\' 9"  (1.753 m)   Wt 90.8 kg   SpO2 100%   BMI 29.56 kg/m  General:   Alert,  pleasant and cooperative in NAD Head:  Normocephalic and atraumatic. Neck:  Supple; no masses or thyromegaly. Lungs:  Clear throughout to auscultation.    Heart:  Regular rate and  rhythm. Abdomen:  Soft, nontender and nondistended. Normal bowel sounds, without guarding, and without rebound.   Neurologic:  Alert and  oriented x4;  grossly normal neurologically.  Impression/Plan: George Doyle is here for an colonoscopy to be performed for a history of adenomatous polyps on 2018   Risks, benefits, limitations, and alternatives regarding  colonoscopy have been reviewed with the patient.  Questions have been answered.  All parties agreeable.   Midge Minium, MD  05/01/2023, 10:09 AM

## 2023-05-01 NOTE — Op Note (Signed)
[facility] Gastroenterology Patient Name: George Doyle Procedure Date: 05/01/2023 10:39 AM MRN: [id number] Account #: [id number] Date of Birth: Jan 03, 1951 Admit Type: Outpatient Age: 72 Room: [facility] ENDO ROOM 4 Gender: Male Note Status: Finalized Instrument Name: Prentice Docker [id number] Procedure:             Colonoscopy Indications:           High risk colon cancer surveillance: Personal history                         of colonic polyps Providers:             Midge Minium MD, MD Referring MD:          Duanne Limerick, MD (Referring MD) Medicines:             Propofol per Anesthesia Complications:         No immediate complications. Procedure:             Pre-Anesthesia Assessment:                        - Prior to the procedure, a History and Physical was                         performed, and patient medications and allergies were                         reviewed. [name] tolerance of previous                         anesthesia was also reviewed. The risks and benefits                         of the procedure and the sedation options and risks                         were discussed with the patient. All questions were                         answered, and informed consent was obtained. Prior                         Anticoagulants: The patient has taken no anticoagulant                         or antiplatelet agents. ASA Grade Assessment: II - A                         patient with mild systemic disease. After reviewing                         the risks and benefits, the patient was deemed in                         satisfactory condition to undergo the procedure.                        After obtaining informed consent, the colonoscope was  passed under direct vision. Throughout the procedure,                         the patient's blood pressure, pulse, and oxygen                         saturations were monitored continuously. The                          Colonoscope was introduced through the anus with the                         intention of advancing to the cecum. The scope was                         advanced to the transverse colon before the procedure                         was aborted. Medications were given. The colonoscopy                         was performed without difficulty. The patient                         tolerated the procedure well. The quality of the bowel                         preparation was poor. Findings:      The perianal and digital rectal examinations were normal.      Stool was found in the entire colon, precluding visualization. Impression:            - Preparation of the colon was poor.                        - Stool in the entire examined colon.                        - No specimens collected. Recommendation:        - Discharge patient to home.                        - Resume previous diet.                        - Continue present medications.                        - Repeat colonoscopy because the bowel preparation was                         poor. Procedure Code(s):     --- Professional ---                        [phone], 53, Colonoscopy, flexible; diagnostic,                         including collection of specimen(s) by brushing or  washing, when performed (separate procedure) Diagnosis Code(s):     --- Professional ---                        Z86.010, Personal history of colonic polyps CPT copyright 2022 American Medical Association. All rights reserved. The codes documented in this report are preliminary and upon coder review may  be revised to meet current compliance requirements. Midge Minium MD, MD 05/01/2023 10:56:39 AM This report has been signed electronically. Number of Addenda: 0 Note Initiated On: 05/01/2023 10:39 AM Total Procedure Duration: 0 hours 2 minutes 19 seconds  Estimated Blood Loss:  Estimated blood loss: none.      [facility]

## 2023-05-01 NOTE — Anesthesia Postprocedure Evaluation (Signed)
Anesthesia Post Note  Patient: George Doyle  Procedure(s) Performed: COLONOSCOPY WITH PROPOFOL  Patient location during evaluation: Endoscopy Anesthesia Type: General Level of consciousness: awake and alert Pain management: pain level controlled Vital Signs Assessment: post-procedure vital signs reviewed and stable Respiratory status: spontaneous breathing, nonlabored ventilation and respiratory function stable Cardiovascular status: blood pressure returned to baseline and stable Postop Assessment: no apparent nausea or vomiting Anesthetic complications: no   No notable events documented.   Last Vitals:  Vitals:   05/01/23 1059 05/01/23 1109  BP: 120/62 (!) 148/66  Pulse: (!) 56   Resp: (!) 97   Temp: (!) 36.4 C   SpO2: 99%     Last Pain:  Vitals:   05/01/23 1119  TempSrc:   PainSc: 0-No pain                 Foye Deer

## 2023-05-01 NOTE — Anesthesia Procedure Notes (Signed)
Procedure Name: General with mask airway Date/Time: 05/01/2023 10:47 AM  Performed by: Lily Lovings, CRNAPre-anesthesia Checklist: Patient identified, Emergency Drugs available, Suction available, Patient being monitored and Timeout performed Patient Re-evaluated:Patient Re-evaluated prior to induction Oxygen Delivery Method: Simple face mask Preoxygenation: Pre-oxygenation with 100% oxygen Induction Type: IV induction

## 2023-05-01 NOTE — Anesthesia Preprocedure Evaluation (Signed)
Anesthesia Evaluation   Patient awake    Reviewed: Allergy & Precautions, NPO status , Patient's Chart, lab work & pertinent test results, reviewed documented beta blocker date and time   Airway Mallampati: II  TM Distance: >3 FB Neck ROM: full    Dental  (+) Missing,    Pulmonary neg pulmonary ROS, former smoker   Pulmonary exam normal        Cardiovascular Exercise Tolerance: Good hypertension, Pt. on medications and Pt. on home beta blockers + CAD, + Past MI (2018, 2022) and + DOE  Normal cardiovascular exam+ Valvular Problems/Murmurs MR   diastolic dysfunction, ascending aortic dilatation, vasovagal syncope  Second NSTEMI on 10/23/2020.  Troponins were trended: 307 --> 1304 --> 6774 --> 6895, repeat cardiac catheterization was performed revealing a normal left ventricular systolic function with an EF of >55%.  There was 40% stenosis of the proximal LCx and no CTO of the proximal RCA with good collateral flow.    ECG: Date: 02/27/2022 Time ECG obtained: 1102 AM Rate: 51 bpm Rhythm:  Sinus bradycardia with first-degree AV block Axis (leads I and aVF): Normal Intervals: PR 252 ms. QRS 96 ms. QTc 405 ms. ST segment and T wave changes: Nonspecific lateral ST and T wave abnormalities.  Evidence of an age undetermined inferior infarct.   Comparison: Similar to previous tracing obtained on 07/18/2021   IMAGING / PROCEDURES: LEFT HEART CATHETERIZATION AND CORONARY ANGIOGRAPHY performed on 10/23/2020 1. Normal left ventricular systolic function with an EF of >55% 2. Occluded proximal RCA with collaterals from the LAD to feel PDA, PL, and distal RCA, otherwise no obstructive CAD  TRANSTHORACIC ECHOCARDIOGRAM performed on 10/22/2020 1. Low normal left ventricular systolic function with EF of 50-55% 2. The right ventricle is normal in size 3. Ascending aorta mildly dilated at 39 mm 4. No valvular regurgitation 5. Normal  gradients; no valvular stenosis 6. No pericardial effusion ECG: Date: 02/27/2022 Time ECG obtained: 1102 AM Rate: 51 bpm Rhythm:  Sinus bradycardia with first-degree AV block Axis (leads I and aVF): Normal Intervals: PR 252 ms. QRS 96 ms. QTc 405 ms. ST segment and T wave changes: Nonspecific lateral ST and T wave abnormalities.  Evidence of an age undetermined inferior infarct.   Comparison: Similar to previous tracing obtained on 07/18/2021   IMAGING / PROCEDURES: LEFT HEART CATHETERIZATION AND CORONARY ANGIOGRAPHY performed on 10/23/2020 1. Normal left ventricular systolic function with an EF of >55% 2. Occluded proximal RCA with collaterals from the LAD to feel PDA, PL, and distal RCA, otherwise no obstructive CAD  TRANSTHORACIC ECHOCARDIOGRAM performed on 10/22/2020 1. Low normal left ventricular systolic function with EF of 50-55% 2. The right ventricle is normal in size 3. Ascending aorta mildly dilated at 39 mm 4. No valvular regurgitation 5. Normal gradients; no valvular stenosis 6. No pericardial effusion    Neuro/Psych Seizures - (Last 10/2020), Well Controlled,  H/o 6 mm frontal SDH s/p fall related to tonic-clonic seizure  negative psych ROS   GI/Hepatic Neg liver ROS,,,  Endo/Other  negative endocrine ROS    Renal/GU      Musculoskeletal   Abdominal  (+) + obese  Peds  Hematology negative hematology ROS (+)   Anesthesia Other Findings Past Medical History: No date: Allergy No date: Arthritis     Comment:  knees, hands 07/21/2017: Ascending aorta dilatation (HCC)     Comment:  a.) TTE 07/21/2017: Ao root 36 mm, asc Ao 41 mm; b.) TTE  10/22/2020: asc Ao 39 mm No date: Coronary artery disease 07/21/2017: Diastolic dysfunction     Comment:  a.) TTE 07/21/2017 (following NSTEMI): EF 50-55%, inf               HK, mild MR, G1DD No date: Dyspnea No date: Former tobacco use 10/21/2020: History of 2019 novel coronavirus disease  (COVID-19) No date: HLD (hyperlipidemia) No date: Hypertension 07/20/2017: NSTEMI (non-ST elevated myocardial infarction) (HCC)     Comment:  a.) CP started 5 days prior to presentation (approx               07/15/2017); b.) LHC 07/23/2017: 100% p-d RCA, 60% p-mLCx              (tortuous), 99% post atrio --> unsuccessful PCI; c.)               referred to interventionist Eldridge Dace) in [place] on               08/30/2017 --> med mgmt recommended 10/23/2020: NSTEMI (non-ST elevated myocardial infarction) (HCC)     Comment:  a.) Troponins trended: 307-->1304--> 6774 --> 6895 -->               5886 --> 5324; b.) LHC 10/23/2020: EF >55%; 40% pLCx, CTO              pRCA --> med mgmt. No date: Pneumonia 03/27/2018: SDH (subdural hematoma) (HCC)     Comment:  a.) 6 mm frontal SDH s/p fall related to tonic-clonic               seizure 1970: Seizures (HCC)     Comment:  a.) last documented seizure was in 10/2020 No date: Umbilical hernia No date: Vasovagal syncope  Past Surgical History: 07/18/2018: BICEPT TENODESIS; Right     Comment:  Procedure: BICEPS TENODESIS;  Surgeon: Christena Flake,               MD;  Location: [facility] OR[facility];  Service: Orthopedics;                Laterality: Right; 2011: COLONOSCOPY     Comment:  cleared for 10 yrs 11/06/2016: COLONOSCOPY WITH PROPOFOL; N/A     Comment:  Procedure: COLONOSCOPY WITH PROPOFOL;  Surgeon: Midge Minium, MD;  Location: [facility];  Service:               Endoscopy;  Laterality: N/A; 07/23/2017: CORONARY STENT INTERVENTION; N/A     Comment:  Procedure: CORONARY STENT INTERVENTION;  Surgeon:               Alwyn Pea, MD;  Location: [facility];               Service: Cardiovascular;  Laterality: N/A; No date: CYST EXCISION     Comment:  Forearm No date: INGUINAL HERNIA REPAIR; Left No date: KNEE SURGERY; Bilateral     Comment:  2 on R) 1 on L) 07/23/2017: LEFT HEART CATH AND CORONARY ANGIOGRAPHY; N/A      Comment:  Procedure: LEFT HEART CATH AND CORONARY ANGIOGRAPHY;                Surgeon: Antonieta Iba, MD;  Location: [facility] INV[facility]               CV LAB;  Service: Cardiovascular;  Laterality: N/A; 10/23/2020: LEFT HEART CATH AND  CORONARY ANGIOGRAPHY; Left     Comment:  Procedure: LEFT HEART CATH AND CORONARY ANGIOGRAPHY;               Location: [facility] 11/06/2016: POLYPECTOMY     Comment:  Procedure: POLYPECTOMY;  Surgeon: Midge Minium, MD;                Location: [facility];  Service: Endoscopy;; 07/18/2018: SHOULDER ARTHROSCOPY WITH OPEN ROTATOR CUFF REPAIR; Right     Comment:  Procedure: SHOULDER ARTHROSCOPY WITH OPEN ROTATOR CUFF               REPAIR;  Surgeon: Christena Flake, MD;  Location: [facility];              Service: Orthopedics;  Laterality: Right;     Reproductive/Obstetrics negative OB ROS                             Anesthesia Physical Anesthesia Plan  ASA: 3  Anesthesia Plan: General   Post-op Pain Management:    Induction: Intravenous  PONV Risk Score and Plan: 3 and Treatment may vary due to age or medical condition, Propofol infusion and TIVA  Airway Management Planned: Natural Airway  Additional Equipment:   Intra-op Plan:   Post-operative Plan:   Informed Consent: I have reviewed the patients History and Physical, chart, labs and discussed the procedure including the risks, benefits and alternatives for the proposed anesthesia with the patient or authorized representative who has indicated his/her understanding and acceptance.     Dental Advisory Given  Plan Discussed with: Anesthesiologist, CRNA and Surgeon  Anesthesia Plan Comments:         Anesthesia Quick Evaluation

## 2023-05-01 NOTE — Transfer of Care (Signed)
Immediate Anesthesia Transfer of Care Note  Patient: George Doyle  Procedure(s) Performed: COLONOSCOPY WITH PROPOFOL  Patient Location: PACU and Endoscopy Unit  Anesthesia Type:General  Level of Consciousness: patient cooperative  Airway & Oxygen Therapy: Patient Spontanous Breathing and Patient connected to face mask oxygen  Post-op Assessment: Report given to RN and Patient moving all extremities X 4  Post vital signs: Reviewed and stable  Last Vitals:  Vitals Value Taken Time  BP 120/62 05/01/23 1100  Temp 36.4 C 05/01/23 1059  Pulse 53 05/01/23 1101  Resp 14 05/01/23 1101  SpO2 99 % 05/01/23 1101  Vitals shown include unfiled device data.  Last Pain:  Vitals:   05/01/23 1059  TempSrc: Temporal  PainSc: Asleep         Complications: No notable events documented.

## 2023-05-02 ENCOUNTER — Encounter: Payer: Self-pay | Admitting: Gastroenterology

## 2023-05-22 ENCOUNTER — Other Ambulatory Visit: Payer: Self-pay

## 2023-05-22 ENCOUNTER — Telehealth: Payer: Self-pay | Admitting: Cardiovascular Disease

## 2023-05-22 MED ORDER — NITROGLYCERIN 0.4 MG SL SUBL: SUBLINGUAL_TABLET | SUBLINGUAL | 0 refills | Status: AC

## 2023-05-22 NOTE — Telephone Encounter (Signed)
*  STAT* If patient is at the pharmacy, call can be transferred to refill team.   1. Which medications need to be refilled? (please list name of each medication and dose if known)  nitroGLYCERIN (NITROSTAT) 0.4 MG SL tablet    2. Would you like to learn more about the convenience, safety, & potential cost savings by using the [facility]?       3. Are you open to using the [facility] (Type [facility].  ).   4. Which pharmacy/location (including street and city if local pharmacy) is medication to be sent to?  [id number] - [place], [place] - [address]     5. Do they need a 30 day or 90 day supply? 30 day

## 2023-05-22 NOTE — Telephone Encounter (Signed)
Disp Refills Start End   nitroGLYCERIN (NITROSTAT) 0.4 MG SL tablet 25 tablet 0 05/22/2023 --   Sig: PLACE 1 TABLET UNDER THE TONGUE EVERY 5 MINUTES FOR 3 DOSES AS NEEDED FOR CHEST PAIN   Sent to pharmacy as: nitroGLYCERIN (NITROSTAT) 0.4 MG SL tablet   E-Prescribing Status: Receipt confirmed by pharmacy (05/22/2023  2:52 PM EDT)    Pharmacy  [id number] [place], [place] - [address]

## 2023-06-14 ENCOUNTER — Encounter: Payer: Self-pay | Admitting: Family Medicine

## 2023-06-14 ENCOUNTER — Ambulatory Visit (INDEPENDENT_AMBULATORY_CARE_PROVIDER_SITE_OTHER): Payer: Medicare HMO | Admitting: Family Medicine

## 2023-06-14 VITALS — BP 100/64 | HR 60 | Temp 97.9°F | Ht 69.0 in | Wt 207.0 lb

## 2023-06-14 DIAGNOSIS — J4520 Mild intermittent asthma, uncomplicated: Secondary | ICD-10-CM | POA: Diagnosis not present

## 2023-06-14 DIAGNOSIS — J0100 Acute maxillary sinusitis, unspecified: Secondary | ICD-10-CM | POA: Diagnosis not present

## 2023-06-14 DIAGNOSIS — Z23 Encounter for immunization: Secondary | ICD-10-CM | POA: Diagnosis not present

## 2023-06-14 DIAGNOSIS — R051 Acute cough: Secondary | ICD-10-CM

## 2023-06-14 MED ORDER — GUAIFENESIN-CODEINE 100-10 MG/5ML PO SYRP
5.0000 mL | ORAL_SOLUTION | Freq: Three times a day (TID) | ORAL | 0 refills | Status: DC | PRN
Start: 2023-06-14 — End: 2023-11-17

## 2023-06-14 MED ORDER — ALBUTEROL SULFATE HFA 108 (90 BASE) MCG/ACT IN AERS
2.0000 | INHALATION_SPRAY | Freq: Four times a day (QID) | RESPIRATORY_TRACT | 2 refills | Status: DC | PRN
Start: 2023-06-14 — End: 2023-08-30

## 2023-06-14 MED ORDER — AMOXICILLIN 500 MG PO CAPS
500.0000 mg | ORAL_CAPSULE | Freq: Three times a day (TID) | ORAL | 0 refills | Status: AC
Start: 2023-06-14 — End: 2023-06-24

## 2023-06-14 NOTE — Progress Notes (Signed)
Date:  06/14/2023   Name:  George Doyle   DOB:  [birth year]   MRN:  [id number]   Chief Complaint: Cough (Laying down at night- coughing and "wheezing" x 5 days)  Sinusitis This is a new problem. The current episode started in the past 7 days. The problem has been gradually improving since onset. There has been no fever. The pain is mild. Associated symptoms include congestion, coughing, headaches and shortness of breath. Pertinent negatives include no chills, diaphoresis, ear pain, hoarse voice, neck pain, sinus pressure, sneezing, sore throat or swollen glands. Treatments tried: tussend. The treatment provided no relief.  Asthma He complains of cough, shortness of breath, sputum production and wheezing. There is no chest tightness, difficulty breathing, frequent throat clearing, hemoptysis or hoarse voice. This is a chronic problem. The current episode started more than 1 year ago. The problem occurs intermittently. The problem has been gradually improving. The cough is productive. Associated symptoms include headaches. Pertinent negatives include no chest pain, dyspnea on exertion, ear pain, fever, malaise/fatigue, nasal congestion, orthopnea, postnasal drip, rhinorrhea, sneezing, sore throat or weight loss. His past medical history is significant for asthma.    Lab Results  Component Value Date   NA 136 02/27/2023   K 4.6 02/27/2023   CO2 25 02/27/2023   GLUCOSE 94 02/27/2023   BUN 19 02/27/2023   CREATININE 1.07 02/27/2023   CALCIUM 9.8 02/27/2023   EGFR 73 02/27/2023   GFRNONAA 53 (L) 07/15/2021   Lab Results  Component Value Date   CHOL 111 02/27/2023   HDL 59 02/27/2023   LDLCALC 39 02/27/2023   TRIG 57 02/27/2023   CHOLHDL 2.1 06/11/2018   No results found for: "TSH" No results found for: "HGBA1C" Lab Results  Component Value Date   WBC 8.3 08/01/2021   HGB 13.3 08/01/2021   HCT 39.4 08/01/2021   MCV 90 08/01/2021   PLT 292 08/01/2021   Lab Results   Component Value Date   ALT 9 02/27/2023   AST 19 02/27/2023   ALKPHOS 89 02/27/2023   BILITOT 0.8 02/27/2023   No results found for: "25OHVITD2", "25OHVITD3", "VD25OH"   Review of Systems  Constitutional:  Negative for chills, diaphoresis, fever, malaise/fatigue and weight loss.  HENT:  Positive for congestion. Negative for ear pain, hoarse voice, postnasal drip, rhinorrhea, sinus pressure, sneezing and sore throat.   Respiratory:  Positive for cough, sputum production, shortness of breath and wheezing. Negative for hemoptysis.   Cardiovascular:  Negative for chest pain, dyspnea on exertion, palpitations and leg swelling.  Endocrine: Negative for polydipsia and polyuria.  Musculoskeletal:  Negative for back pain, gait problem and neck pain.  Neurological:  Positive for headaches. Negative for seizures, speech difficulty, light-headedness and numbness.    Patient Active Problem List   Diagnosis Date Noted   History of colonic polyps 05/01/2023   Umbilical hernia without obstruction and without gangrene    Benign neoplasm of skin of left forearm 01/27/2019   Pure hypercholesterolemia 06/21/2018   Poorly-controlled hypertension 06/21/2018   Seizure disorder (HCC) 06/21/2018   Hill-Sachs lesion of right shoulder 06/19/2018   Injury of tendon of long head of right biceps 06/19/2018   Rotator cuff tendinitis, right 06/19/2018   Traumatic complete tear of right rotator cuff 06/19/2018   Headache disorder 05/09/2018   CAD (coronary artery disease), native coronary artery 03/28/2018   History of seizures 03/28/2018   Subdural hematoma (HCC) 03/27/2018   Vasovagal syncope 03/27/2018   Old MI (  myocardial infarction) 08/30/2017   CAD, multiple vessel 08/01/2017   NSTEMI (non-ST elevated myocardial infarction) (HCC) 07/20/2017   Hyperlipidemia 05/10/2017   Preop cardiovascular exam    Benign neoplasm of ascending colon    Polyp of sigmoid colon    Essential hypertension 03/03/2016     No Known Allergies  Past Surgical History:  Procedure Laterality Date   BICEPT TENODESIS Right 07/18/2018   Procedure: BICEPS TENODESIS;  Surgeon: Christena Flake, MD;  Location: [facility];  Service: Orthopedics;  Laterality: Right;   CARDIAC CATHETERIZATION     COLONOSCOPY  2011   cleared for 10 yrs   COLONOSCOPY WITH PROPOFOL N/A 11/06/2016   Procedure: COLONOSCOPY WITH PROPOFOL;  Surgeon: Midge Minium, MD;  Location: [facility];  Service: Endoscopy;  Laterality: N/A;   COLONOSCOPY WITH PROPOFOL N/A 05/01/2023   Procedure: COLONOSCOPY WITH PROPOFOL;  Surgeon: Midge Minium, MD;  Location: [facility];  Service: Endoscopy;  Laterality: N/A;   CORONARY STENT INTERVENTION N/A 07/23/2017   Procedure: CORONARY STENT INTERVENTION;  Surgeon: Alwyn Pea, MD;  Location: [facility];  Service: Cardiovascular;  Laterality: N/A;   CYST EXCISION     Forearm   INGUINAL HERNIA REPAIR Left    INSERTION OF MESH  05/25/2022   Procedure: INSERTION OF MESH;  Surgeon: Henrene Dodge, MD;  Location: [facility];  Service: General;;   KNEE SURGERY Bilateral    2 on R) 1 on L)   LEFT HEART CATH AND CORONARY ANGIOGRAPHY N/A 07/23/2017   Procedure: LEFT HEART CATH AND CORONARY ANGIOGRAPHY;  Surgeon: Antonieta Iba, MD;  Location: [facility];  Service: Cardiovascular;  Laterality: N/A;   LEFT HEART CATH AND CORONARY ANGIOGRAPHY Left 10/23/2020   Procedure: LEFT HEART CATH AND CORONARY ANGIOGRAPHY; Location: [facility]   POLYPECTOMY  11/06/2016   Procedure: POLYPECTOMY;  Surgeon: Midge Minium, MD;  Location: [facility];  Service: Endoscopy;;   SHOULDER ARTHROSCOPY WITH OPEN ROTATOR CUFF REPAIR Right 07/18/2018   Procedure: SHOULDER ARTHROSCOPY WITH OPEN ROTATOR CUFF REPAIR;  Surgeon: Christena Flake, MD;  Location: [facility];  Service: Orthopedics;  Laterality: Right;    Social History   Tobacco Use   Smoking status: Former    Types: Cigarettes   Smokeless tobacco:  Never   Tobacco comments:    quit over 20 yrs ago  Vaping Use   Vaping status: Never Used  Substance Use Topics   Alcohol use: Yes    Alcohol/week: 0.0 standard drinks of alcohol    Comment: 1-2 drinks/year   Drug use: No     Medication list has been reviewed and updated.  Current Meds  Medication Sig   acetaminophen (TYLENOL) 500 MG tablet Take 2 tablets (1,000 mg total) by mouth every 6 (six) hours as needed for mild pain.   aspirin EC 81 MG EC tablet Take 1 tablet (81 mg total) daily by mouth.   atorvastatin (LIPITOR) 80 MG tablet TAKE 1 TABLET(80 MG) BY MOUTH DAILY AT 6 PM   carvedilol (COREG) 3.125 MG tablet Take 1 tablet (3.125 mg total) by mouth 2 (two) times daily.   citalopram (CELEXA) 20 MG tablet Take 20 mg by mouth at bedtime.   donepezil (ARICEPT) 5 MG tablet Take 5 mg by mouth at bedtime.   EPINEPHrine 0.3 mg/0.3 mL IJ SOAJ injection Inject 0.3 mg into the muscle as needed for anaphylaxis.   fexofenadine (ALLEGRA) 180 MG tablet Take 180 mg by mouth as needed for allergies or  rhinitis. otc   lamoTRIgine (LAMICTAL) 200 MG tablet Take 200 mg by mouth 2 (two) times daily.   latanoprost (XALATAN) 0.005 % ophthalmic solution Place 1 drop into both eyes at bedtime.   losartan (COZAAR) 50 MG tablet Take 1 tablet (50 mg total) by mouth 2 (two) times daily.   nitroGLYCERIN (NITROSTAT) 0.4 MG SL tablet PLACE 1 TABLET UNDER THE TONGUE EVERY 5 MINUTES FOR 3 DOSES AS NEEDED FOR CHEST PAIN   spironolactone (ALDACTONE) 25 MG tablet Take 1 tablet (25 mg total) by mouth daily.       06/14/2023    4:06 PM 02/27/2023   10:42 AM 08/28/2022   10:32 AM 05/03/2022   11:36 AM  GAD 7 : Generalized Anxiety Score  Nervous, Anxious, on Edge 0 0 0 0  Control/stop worrying 0 0 0 0  Worry too much - different things 0 0 0 0  Trouble relaxing 0 0 0 0  Restless 0 0 0 0  Easily annoyed or irritable 0 0 0 0  Afraid - awful might happen 0 0 0 0  Total GAD 7 Score 0 0 0 0  Anxiety Difficulty Not  difficult at all Not difficult at all Not difficult at all Not difficult at all       06/14/2023    4:06 PM 02/27/2023   10:42 AM 11/15/2022    2:01 PM  Depression screen PHQ 2/9  Decreased Interest 0 0 0  Down, Depressed, Hopeless 0 0 0  PHQ - 2 Score 0 0 0  Altered sleeping 0 0 0  Tired, decreased energy 0 0 0  Change in appetite 0 0 0  Feeling bad or failure about yourself  0 0 0  Trouble concentrating 0 0 0  Moving slowly or fidgety/restless 0 0 0  Suicidal thoughts 0 0 0  PHQ-9 Score 0 0 0  Difficult doing work/chores Not difficult at all Not difficult at all Not difficult at all    BP Readings from Last 3 Encounters:  06/14/23 100/64  05/01/23 (!) 148/66  03/05/23 120/60    Physical Exam Vitals and nursing note reviewed.  HENT:     Head: Normocephalic.     Right Ear: Tympanic membrane and external ear normal. There is no impacted cerumen.     Left Ear: Tympanic membrane and external ear normal. There is no impacted cerumen.     Nose: Nose normal. No congestion or rhinorrhea.     Mouth/Throat:     Mouth: Mucous membranes are moist.     Pharynx: No oropharyngeal exudate or posterior oropharyngeal erythema.  Eyes:     General: No scleral icterus.       Right eye: No discharge.        Left eye: No discharge.     Conjunctiva/sclera: Conjunctivae normal.     Pupils: Pupils are equal, round, and reactive to light.  Neck:     Thyroid: No thyromegaly.     Vascular: No JVD.     Trachea: No tracheal deviation.  Cardiovascular:     Rate and Rhythm: Normal rate and regular rhythm.     Heart sounds: Normal heart sounds. No murmur heard.    No friction rub. No gallop.  Pulmonary:     Effort: No respiratory distress.     Breath sounds: Rhonchi present. No wheezing or rales.  Chest:     Chest wall: No tenderness.  Abdominal:     General: Bowel sounds are normal.  Palpations: Abdomen is soft. There is no mass.     Tenderness: There is no abdominal tenderness. There is  no guarding or rebound.  Musculoskeletal:        General: No tenderness. Normal range of motion.     Cervical back: Normal range of motion and neck supple.  Lymphadenopathy:     Cervical: No cervical adenopathy.  Skin:    General: Skin is warm.     Findings: No rash.  Neurological:     Mental Status: He is alert and oriented to person, place, and time.     Cranial Nerves: No cranial nerve deficit.     Deep Tendon Reflexes: Reflexes are normal and symmetric.     Wt Readings from Last 3 Encounters:  06/14/23 207 lb (93.9 kg)  05/01/23 200 lb 3.2 oz (90.8 kg)  03/05/23 208 lb (94.3 kg)    BP 100/64   Pulse 60   Temp 97.9 F (36.6 C) (Oral)   Ht 5\' 9"  (1.753 m)   Wt 207 lb (93.9 kg)   SpO2 98%   BMI 30.57 kg/m   Assessment and Plan:  1. Acute maxillary sinusitis, recurrence not specified New onset.  Persistent.  Stable.  We will initiate amoxicillin 500 mg 3 times a day for 10 days. - amoxicillin (AMOXIL) 500 MG capsule; Take 1 capsule (500 mg total) by mouth 3 (three) times daily for 10 days.  Dispense: 30 capsule; Refill: 0  2. Mild intermittent reactive airway disease without complication Chronic.  Intermittent.  Episodic.  Stable with mild presentation.  Patient's been encouraged to start his inhaler 2 puffs every 6 hours and that he had breath and harsh breath sounds on - albuterol (VENTOLIN HFA) 108 (90 Base) MCG/ACT inhaler; Inhale 2 puffs into the lungs every 6 (six) hours as needed for wheezing or shortness of breath.  Dispense: 8 g; Refill: 2  3. Acute cough Chronic.  Controlled.  Stable.  Insurance is not going to pay for promethazine with codeine DM.  Patient has been given Robitussin AC but encouraged to use Mucinex DM during the day and Robitussin AC primarily at night. - guaiFENesin-codeine (ROBITUSSIN AC) 100-10 MG/5ML syrup; Take 5 mLs by mouth 3 (three) times daily as needed for cough.  Dispense: 118 mL; Refill: 0  4. Flu vaccine need Discussed and  administered - Flu Vaccine Trivalent High Dose (Fluad)    Elizabeth Sauer, MD

## 2023-08-30 ENCOUNTER — Encounter: Payer: Self-pay | Admitting: Family Medicine

## 2023-08-30 ENCOUNTER — Ambulatory Visit: Payer: Medicare HMO | Admitting: Family Medicine

## 2023-08-30 VITALS — BP 118/84 | HR 56 | Ht 69.0 in | Wt 208.0 lb

## 2023-08-30 DIAGNOSIS — J4520 Mild intermittent asthma, uncomplicated: Secondary | ICD-10-CM | POA: Diagnosis not present

## 2023-08-30 MED ORDER — ALBUTEROL SULFATE HFA 108 (90 BASE) MCG/ACT IN AERS
2.0000 | INHALATION_SPRAY | Freq: Four times a day (QID) | RESPIRATORY_TRACT | 2 refills | Status: DC | PRN
Start: 2023-08-30 — End: 2024-02-07

## 2023-08-30 NOTE — Progress Notes (Signed)
Date:  08/30/2023   Name:  George Doyle   DOB:  06-04-1951   MRN:  [id number]   Chief Complaint: Mild intermittent reactive airway disease without complicat  Asthma There is no chest tightness, cough, difficulty breathing, frequent throat clearing, hemoptysis, hoarse voice, shortness of breath, sputum production or wheezing. This is a new problem. The current episode started more than 1 year ago. The problem occurs intermittently. The problem has been gradually improving. Pertinent negatives include no appetite change, chest pain, dyspnea on exertion, ear congestion, ear pain, fever, headaches, heartburn, malaise/fatigue, myalgias, nasal congestion, orthopnea, PND, postnasal drip, rhinorrhea, sneezing, sore throat, sweats, trouble swallowing or weight loss. His symptoms are alleviated by beta-agonist. He reports no improvement on treatment. His past medical history is significant for asthma. There is no history of COPD.    Lab Results  Component Value Date   NA 136 02/27/2023   K 4.6 02/27/2023   CO2 25 02/27/2023   GLUCOSE 94 02/27/2023   BUN 19 02/27/2023   CREATININE 1.07 02/27/2023   CALCIUM 9.8 02/27/2023   EGFR 73 02/27/2023   GFRNONAA 53 (L) 07/15/2021   Lab Results  Component Value Date   CHOL 111 02/27/2023   HDL 59 02/27/2023   LDLCALC 39 02/27/2023   TRIG 57 02/27/2023   CHOLHDL 2.1 06/11/2018   No results found for: "TSH" No results found for: "HGBA1C" Lab Results  Component Value Date   WBC 8.3 08/01/2021   HGB 13.3 08/01/2021   HCT 39.4 08/01/2021   MCV 90 08/01/2021   PLT 292 08/01/2021   Lab Results  Component Value Date   ALT 9 02/27/2023   AST 19 02/27/2023   ALKPHOS 89 02/27/2023   BILITOT 0.8 02/27/2023   No results found for: "25OHVITD2", "25OHVITD3", "VD25OH"   Review of Systems  Constitutional:  Negative for appetite change, fever, malaise/fatigue and weight loss.  HENT:  Negative for ear pain, hoarse voice, postnasal drip,  rhinorrhea, sneezing, sore throat and trouble swallowing.   Respiratory:  Negative for cough, hemoptysis, sputum production, shortness of breath and wheezing.   Cardiovascular:  Negative for chest pain, dyspnea on exertion and PND.  Gastrointestinal:  Negative for heartburn.  Musculoskeletal:  Negative for myalgias.  Neurological:  Negative for headaches.    Patient Active Problem List   Diagnosis Date Noted   History of colonic polyps 05/01/2023   Umbilical hernia without obstruction and without gangrene    Benign neoplasm of skin of left forearm 01/27/2019   Pure hypercholesterolemia 06/21/2018   Poorly-controlled hypertension 06/21/2018   Seizure disorder (HCC) 06/21/2018   Hill-Sachs lesion of right shoulder 06/19/2018   Injury of tendon of long head of right biceps 06/19/2018   Rotator cuff tendinitis, right 06/19/2018   Traumatic complete tear of right rotator cuff 06/19/2018   Headache disorder 05/09/2018   CAD (coronary artery disease), native coronary artery 03/28/2018   History of seizures 03/28/2018   Subdural hematoma (HCC) 03/27/2018   Vasovagal syncope 03/27/2018   Old MI (myocardial infarction) 08/30/2017   CAD, multiple vessel 08/01/2017   NSTEMI (non-ST elevated myocardial infarction) (HCC) 07/20/2017   Hyperlipidemia 05/10/2017   Preop cardiovascular exam    Benign neoplasm of ascending colon    Polyp of sigmoid colon    Essential hypertension 03/03/2016    No Known Allergies  Past Surgical History:  Procedure Laterality Date   BICEPT TENODESIS Right 07/18/2018   Procedure: BICEPS TENODESIS;  Surgeon: Christena Flake, MD;  Location: [facility];  Service: Orthopedics;  Laterality: Right;   CARDIAC CATHETERIZATION     COLONOSCOPY  2011   cleared for 10 yrs   COLONOSCOPY WITH PROPOFOL N/A 11/06/2016   Procedure: COLONOSCOPY WITH PROPOFOL;  Surgeon: Midge Minium, MD;  Location: [facility];  Service: Endoscopy;  Laterality: N/A;   COLONOSCOPY WITH  PROPOFOL N/A 05/01/2023   Procedure: COLONOSCOPY WITH PROPOFOL;  Surgeon: Midge Minium, MD;  Location: [facility];  Service: Endoscopy;  Laterality: N/A;   CORONARY STENT INTERVENTION N/A 07/23/2017   Procedure: CORONARY STENT INTERVENTION;  Surgeon: Alwyn Pea, MD;  Location: [facility] IN[facility];  Service: Cardiovascular;  Laterality: N/A;   CYST EXCISION     Forearm   INGUINAL HERNIA REPAIR Left    INSERTION OF MESH  05/25/2022   Procedure: INSERTION OF MESH;  Surgeon: Henrene Dodge, MD;  Location: [facility];  Service: General;;   KNEE SURGERY Bilateral    2 on R) 1 on L)   LEFT HEART CATH AND CORONARY ANGIOGRAPHY N/A 07/23/2017   Procedure: LEFT HEART CATH AND CORONARY ANGIOGRAPHY;  Surgeon: Antonieta Iba, MD;  Location: [facility];  Service: Cardiovascular;  Laterality: N/A;   LEFT HEART CATH AND CORONARY ANGIOGRAPHY Left 10/23/2020   Procedure: LEFT HEART CATH AND CORONARY ANGIOGRAPHY; Location: [facility]   POLYPECTOMY  11/06/2016   Procedure: POLYPECTOMY;  Surgeon: Midge Minium, MD;  Location: [facility];  Service: Endoscopy;;   SHOULDER ARTHROSCOPY WITH OPEN ROTATOR CUFF REPAIR Right 07/18/2018   Procedure: SHOULDER ARTHROSCOPY WITH OPEN ROTATOR CUFF REPAIR;  Surgeon: Christena Flake, MD;  Location: [facility];  Service: Orthopedics;  Laterality: Right;    Social History   Tobacco Use   Smoking status: Former    Types: Cigarettes   Smokeless tobacco: Never   Tobacco comments:    quit over 20 yrs ago  Vaping Use   Vaping status: Never Used  Substance Use Topics   Alcohol use: Yes    Alcohol/week: 0.0 standard drinks of alcohol    Comment: 1-2 drinks/year   Drug use: No     Medication list has been reviewed and updated.  Current Meds  Medication Sig   acetaminophen (TYLENOL) 500 MG tablet Take 2 tablets (1,000 mg total) by mouth every 6 (six) hours as needed for mild pain.   albuterol (VENTOLIN HFA) 108 (90 Base) MCG/ACT inhaler Inhale 2 puffs  into the lungs every 6 (six) hours as needed for wheezing or shortness of breath.   aspirin EC 81 MG EC tablet Take 1 tablet (81 mg total) daily by mouth.   atorvastatin (LIPITOR) 80 MG tablet TAKE 1 TABLET(80 MG) BY MOUTH DAILY AT 6 PM   carvedilol (COREG) 3.125 MG tablet Take 1 tablet (3.125 mg total) by mouth 2 (two) times daily.   citalopram (CELEXA) 20 MG tablet Take 20 mg by mouth at bedtime.   donepezil (ARICEPT) 5 MG tablet Take 5 mg by mouth at bedtime.   EPINEPHrine 0.3 mg/0.3 mL IJ SOAJ injection Inject 0.3 mg into the muscle as needed for anaphylaxis.   fexofenadine (ALLEGRA) 180 MG tablet Take 180 mg by mouth as needed for allergies or rhinitis. otc   guaiFENesin-codeine (ROBITUSSIN AC) 100-10 MG/5ML syrup Take 5 mLs by mouth 3 (three) times daily as needed for cough.   lamoTRIgine (LAMICTAL) 200 MG tablet Take 200 mg by mouth 2 (two) times daily.   latanoprost (XALATAN) 0.005 % ophthalmic solution Place 1 drop into both  eyes at bedtime.   losartan (COZAAR) 50 MG tablet Take 1 tablet (50 mg total) by mouth 2 (two) times daily.   nitroGLYCERIN (NITROSTAT) 0.4 MG SL tablet PLACE 1 TABLET UNDER THE TONGUE EVERY 5 MINUTES FOR 3 DOSES AS NEEDED FOR CHEST PAIN   spironolactone (ALDACTONE) 25 MG tablet Take 1 tablet (25 mg total) by mouth daily.       08/30/2023   10:08 AM 06/14/2023    4:06 PM 02/27/2023   10:42 AM 08/28/2022   10:32 AM  GAD 7 : Generalized Anxiety Score  Nervous, Anxious, on Edge 0 0 0 0  Control/stop worrying 0 0 0 0  Worry too much - different things 0 0 0 0  Trouble relaxing 0 0 0 0  Restless 0 0 0 0  Easily annoyed or irritable 0 0 0 0  Afraid - awful might happen 0 0 0 0  Total GAD 7 Score 0 0 0 0  Anxiety Difficulty Not difficult at all Not difficult at all Not difficult at all Not difficult at all       08/30/2023   10:07 AM 06/14/2023    4:06 PM 02/27/2023   10:42 AM  Depression screen PHQ 2/9  Decreased Interest 0 0 0  Down, Depressed, Hopeless 0  0 0  PHQ - 2 Score 0 0 0  Altered sleeping 0 0 0  Tired, decreased energy 0 0 0  Change in appetite 0 0 0  Feeling bad or failure about yourself  0 0 0  Trouble concentrating 0 0 0  Moving slowly or fidgety/restless 0 0 0  Suicidal thoughts 0 0 0  PHQ-9 Score 0 0 0  Difficult doing work/chores Not difficult at all Not difficult at all Not difficult at all    BP Readings from Last 3 Encounters:  08/30/23 118/84  06/14/23 100/64  05/01/23 (!) 148/66    Physical Exam Vitals and nursing note reviewed.  HENT:     Head: Normocephalic.     Right Ear: Tympanic membrane and external ear normal.     Left Ear: Tympanic membrane and external ear normal.     Nose: Nose normal.     Mouth/Throat:     Mouth: Mucous membranes are moist.     Pharynx: No oropharyngeal exudate or posterior oropharyngeal erythema.  Eyes:     General: No scleral icterus.       Right eye: No discharge.        Left eye: No discharge.     Conjunctiva/sclera: Conjunctivae normal.     Pupils: Pupils are equal, round, and reactive to light.  Neck:     Thyroid: No thyromegaly.     Vascular: No JVD.     Trachea: No tracheal deviation.  Cardiovascular:     Rate and Rhythm: Normal rate and regular rhythm.     Heart sounds: Normal heart sounds, S1 normal and S2 normal. No murmur heard.    No systolic murmur is present.     No diastolic murmur is present.     No friction rub. No gallop.  Pulmonary:     Effort: No respiratory distress.     Breath sounds: No decreased air movement. Wheezing present. No decreased breath sounds, rhonchi or rales.  Abdominal:     General: Bowel sounds are normal.     Palpations: Abdomen is soft. There is no mass.     Tenderness: There is no abdominal tenderness. There is no guarding or rebound.  Musculoskeletal:        General: No tenderness. Normal range of motion.     Cervical back: Normal range of motion and neck supple.  Lymphadenopathy:     Cervical: No cervical adenopathy.   Skin:    General: Skin is warm.     Findings: No rash.  Neurological:     Mental Status: He is alert.     Deep Tendon Reflexes: Reflexes are normal and symmetric.     Reflex Scores:      Patellar reflexes are 2+ on the right side and 2+ on the left side.    Wt Readings from Last 3 Encounters:  08/30/23 208 lb (94.3 kg)  06/14/23 207 lb (93.9 kg)  05/01/23 200 lb 3.2 oz (90.8 kg)    BP 118/84   Pulse (!) 56   Ht 5\' 9"  (1.753 m)   Wt 208 lb (94.3 kg)   SpO2 100%   BMI 30.72 kg/m   Assessment and Plan: 1. Mild intermittent reactive airway disease without complication (Primary) Chronic.  Controlled.  Stable.  Intermittent and mild patient has some mild wheezing today but has not been using his albuterol and I have encouraged him to do so.  We will refill and patient will return in 6 months for reevaluation. - albuterol (VENTOLIN HFA) 108 (90 Base) MCG/ACT inhaler; Inhale 2 puffs into the lungs every 6 (six) hours as needed for wheezing or shortness of breath.  Dispense: 8 g; Refill: 2     Elizabeth Sauer, MD

## 2023-09-21 ENCOUNTER — Other Ambulatory Visit (HOSPITAL_COMMUNITY): Payer: Self-pay | Admitting: Ophthalmology

## 2023-09-21 DIAGNOSIS — H532 Diplopia: Secondary | ICD-10-CM

## 2023-09-21 DIAGNOSIS — H5122 Internuclear ophthalmoplegia, left eye: Secondary | ICD-10-CM

## 2023-09-25 ENCOUNTER — Ambulatory Visit
Admission: RE | Admit: 2023-09-25 | Discharge: 2023-09-25 | Disposition: A | Discharge status: Home / Self Care | Payer: Medicare HMO | Source: Ambulatory Visit | Attending: Ophthalmology | Admitting: Ophthalmology

## 2023-09-25 DIAGNOSIS — H532 Diplopia: Secondary | ICD-10-CM | POA: Diagnosis present

## 2023-09-25 DIAGNOSIS — H5122 Internuclear ophthalmoplegia, left eye: Secondary | ICD-10-CM | POA: Diagnosis present

## 2023-09-25 MED ORDER — GADOBUTROL 1 MMOL/ML IV SOLN
9.0000 mL | Freq: Once | INTRAVENOUS | Status: AC | PRN
  Administered 2023-09-25: 9 mL via INTRAVENOUS

## 2023-09-26 ENCOUNTER — Other Ambulatory Visit: Payer: Self-pay | Admitting: Physician Assistant

## 2023-09-26 ENCOUNTER — Ambulatory Visit
Admission: RE | Admit: 2023-09-26 | Discharge: 2023-09-26 | Disposition: A | Discharge status: Home / Self Care | Payer: Medicare HMO | Source: Ambulatory Visit | Attending: Physician Assistant | Admitting: Physician Assistant

## 2023-09-26 DIAGNOSIS — I639 Cerebral infarction, unspecified: Secondary | ICD-10-CM

## 2023-09-26 MED ORDER — IOPAMIDOL (ISOVUE-370) INJECTION 76%
75.0000 mL | Freq: Once | INTRAVENOUS | Status: AC | PRN
  Administered 2023-09-26: 75 mL via INTRAVENOUS

## 2023-10-25 ENCOUNTER — Telehealth: Payer: Self-pay | Admitting: Cardiovascular Disease

## 2023-10-25 NOTE — Telephone Encounter (Signed)
 Pt's wife received message about r/s appt for 10/29/23. She is upset and said they have been waiting for pt to be seen because of his symptoms. She would like a call back

## 2023-10-25 NOTE — Telephone Encounter (Signed)
 Call pt to reschedule appointment, Dr. Gollan will be in a meeting

## 2023-10-25 NOTE — Telephone Encounter (Signed)
 [name] appointment has been moved up to 3:20. [name] wife has been made aware.

## 2023-10-28 NOTE — Progress Notes (Signed)
 Cardiology Office Note  Date:  10/29/2023   ID:  George Doyle, George Doyle 1950-12-14, MRN [id number]  PCP:  Clarise Crooks, MD   Chief Complaint  Patient presents with   Follow up due to recent stroke     "Doing well."     HPI:  [name] is a pleasant 73 yo male with history of  Seizure disorder 2005 in July 2019 HTN  former tobacco abuse  admitted with chest pain 07/21/2017 NSTEMI, TNT 14 Cath 07/23/2017  , Occluded proximal RCA, collaterals from left to right, Moderate to severe mid Left circumflex dz, tortuous vessel, calcified Moderate inferior wall hypokinesis attempt PCI to the RCA was unsuccessful, possibly went subintimal Seen by Dr. Jacquelynn Matter August 30, 2017 Medical management recommended of his occluded RCA He presents for follow-up of his coronary artery disease, stable angina, preop cardiovascular  Last seen in clinic by myself 6/24  Presents today with his wife George Doyle 1st 2024 developed severe vomiting 3 am until next day Next day reported having double vision, feeling dizzy, having trouble walking in house, needed to walk with a walker Better 2 days later [facility] ophthalmology concern for stroke  Saw neurology Head MRI: Showed small stroke Small acute infarct in the posterior pons.  CT scan head neck detailing vascular disease as below 50% stenosis in the proximal left ICA, with a subsequent medially directed outpouching that measures 6 x 4 x 5 mm, most likely a pseudoaneurysm. Moderate stenosis at the origin of the left vertebral artery. Mild stenosis in the right V2 segment, secondary to degenerative changes in the cervical spine. 4. No intracranial large vessel occlusion. Moderate stenosis in the bilateral supraclinoid ICAs, moderate stenosis in the anterior cerebral arteries, and moderate to severe stenosis in the more distal right V4. 5. 90% stenosis of the proximal right subclavian artery.  Currently feels back to his baseline Reports he was  treated with Plavix  for 21 days now just on aspirin  alone No regular exercise program  Lab work reviewed Total cholesterol 103, LDL 31  EKG personally reviewed by myself on todays visit EKG Interpretation Date/Time:  Monday October 29 2023 15:27:11 EST Ventricular Rate:  52 PR Interval:  252 QRS Duration:  112 QT Interval:  462 QTC Calculation: 429 R Axis:   3  Text Interpretation: Sinus bradycardia with 1st degree A-V block Left ventricular hypertrophy with repolarization abnormality ( Cornell product ) Cannot rule out Inferior infarct , age undetermined When compared with ECG of 15-Jul-2021 15:07, Borderline criteria for Lateral infarct are no longer Present Minimal criteria for Inferior infarct are now Present T wave inversion no longer evident in Inferior leads Confirmed by Belva Boyden [phone]) on 10/29/2023 3:52:10 PM    Other past medical history reviewed  emergency room March 27, 2018 for seizure  full body tonic-clonic seizure-like activity, hurt his right shoulder at that time CT scan head subdural hematoma, Received IV Keppra .   was transferred to [facility]  Echo November 2018  ejection fraction was in the range of 50% to 55%. Aorta: Aortic root was mildly dilated, 3.6 cm - Ascending aorta: The ascending aorta was mildly dilated, 4.1 cm   PMH:   has a past medical history of Allergy, Arthritis, Ascending aorta dilatation (HCC) (07/21/2017), Coronary artery disease, Diastolic dysfunction (07/21/2017), Dyspnea, Former tobacco use, History of 2019 novel coronavirus disease (COVID-19) (10/21/2020), HLD (hyperlipidemia), Hypertension, NSTEMI (non-ST elevated myocardial infarction) (HCC) (07/20/2017), NSTEMI (non-ST elevated myocardial infarction) (HCC) (10/23/2020), Pneumonia, SDH (subdural hematoma) (HCC) (  03/27/2018), Seizures (HCC) (1970), Umbilical hernia, and Vasovagal syncope.  PSH:    Past Surgical History:  Procedure Laterality Date   BICEPT TENODESIS Right  07/18/2018   Procedure: BICEPS TENODESIS;  Surgeon: Elner Hahn, MD;  Location: [facility];  Service: Orthopedics;  Laterality: Right;   CARDIAC CATHETERIZATION     COLONOSCOPY  2011   cleared for 10 yrs   COLONOSCOPY WITH PROPOFOL  N/A 11/06/2016   Procedure: COLONOSCOPY WITH PROPOFOL ;  Surgeon: Marnee Sink, MD;  Location: [facility];  Service: Endoscopy;  Laterality: N/A;   COLONOSCOPY WITH PROPOFOL  N/A 05/01/2023   Procedure: COLONOSCOPY WITH PROPOFOL ;  Surgeon: Marnee Sink, MD;  Location: [facility];  Service: Endoscopy;  Laterality: N/A;   CORONARY STENT INTERVENTION N/A 07/23/2017   Procedure: CORONARY STENT INTERVENTION;  Surgeon: Antonette Batters, MD;  Location: [facility];  Service: Cardiovascular;  Laterality: N/A;   CYST EXCISION     Forearm   INGUINAL HERNIA REPAIR Left    INSERTION OF MESH  05/25/2022   Procedure: INSERTION OF MESH;  Surgeon: Emmalene Hare, MD;  Location: [facility];  Service: General;;   KNEE SURGERY Bilateral    2 on R) 1 on L)   LEFT HEART CATH AND CORONARY ANGIOGRAPHY N/A 07/23/2017   Procedure: LEFT HEART CATH AND CORONARY ANGIOGRAPHY;  Surgeon: Devorah Fonder, MD;  Location: [facility];  Service: Cardiovascular;  Laterality: N/A;   LEFT HEART CATH AND CORONARY ANGIOGRAPHY Left 10/23/2020   Procedure: LEFT HEART CATH AND CORONARY ANGIOGRAPHY; Location: [facility]   POLYPECTOMY  11/06/2016   Procedure: POLYPECTOMY;  Surgeon: Marnee Sink, MD;  Location: [facility];  Service: Endoscopy;;   SHOULDER ARTHROSCOPY WITH OPEN ROTATOR CUFF REPAIR Right 07/18/2018   Procedure: SHOULDER ARTHROSCOPY WITH OPEN ROTATOR CUFF REPAIR;  Surgeon: Elner Hahn, MD;  Location: [facility];  Service: Orthopedics;  Laterality: Right;    Current Outpatient Medications  Medication Sig Dispense Refill   acetaminophen  (TYLENOL ) 500 MG tablet Take 2 tablets (1,000 mg total) by mouth every 6 (six) hours as needed for mild pain.     aspirin  EC 81  MG EC tablet Take 1 tablet (81 mg total) daily by mouth. 30 tablet 1   atorvastatin  (LIPITOR ) 80 MG tablet TAKE 1 TABLET(80 MG) BY MOUTH DAILY AT 6 PM 90 tablet 3   carvedilol  (COREG ) 3.125 MG tablet Take 1 tablet (3.125 mg total) by mouth 2 (two) times daily. 180 tablet 3   citalopram  (CELEXA ) 20 MG tablet Take 20 mg by mouth at bedtime.     donepezil  (ARICEPT ) 5 MG tablet Take 5 mg by mouth at bedtime.     fexofenadine (ALLEGRA) 180 MG tablet Take 180 mg by mouth as needed for allergies or rhinitis. otc     lamoTRIgine  (LAMICTAL ) 200 MG tablet Take 200 mg by mouth 2 (two) times daily.     losartan  (COZAAR ) 50 MG tablet Take 1 tablet (50 mg total) by mouth 2 (two) times daily. 180 tablet 3   nitroGLYCERIN  (NITROSTAT ) 0.4 MG SL tablet PLACE 1 TABLET UNDER THE TONGUE EVERY 5 MINUTES FOR 3 DOSES AS NEEDED FOR CHEST PAIN 25 tablet 0   spironolactone  (ALDACTONE ) 25 MG tablet Take 1 tablet (25 mg total) by mouth daily. 90 tablet 3   albuterol  (VENTOLIN  HFA) 108 (90 Base) MCG/ACT inhaler Inhale 2 puffs into the lungs every 6 (six) hours as needed for wheezing or shortness of breath. (Patient not taking: Reported on 10/29/2023)  8 g 2   EPINEPHrine  0.3 mg/0.3 mL IJ SOAJ injection Inject 0.3 mg into the muscle as needed for anaphylaxis. (Patient not taking: Reported on 10/29/2023) 1 each 1   guaiFENesin -codeine  (ROBITUSSIN AC) 100-10 MG/5ML syrup Take 5 mLs by mouth 3 (three) times daily as needed for cough. (Patient not taking: Reported on 10/29/2023) 118 mL 0   No current facility-administered medications for this visit.    Allergies:   Patient has no known allergies.   Social History:  The patient  reports that he has quit smoking. His smoking use included cigarettes. He has never used smokeless tobacco. He reports current alcohol use. He reports that he does not use drugs.   Family History:   family history includes CAD in his father; Heart failure in his mother; Lung cancer in his father.   Review of  Systems: Review of Systems  Constitutional: Negative.   HENT: Negative.    Respiratory: Negative.    Cardiovascular: Negative.   Gastrointestinal: Negative.   Musculoskeletal: Negative.   Neurological: Negative.   Psychiatric/Behavioral: Negative.    All other systems reviewed and are negative.  PHYSICAL EXAM: VS:  BP (!) 140/78 (BP Location: Left Arm, Patient Position: Sitting, Cuff Size: Normal)   Pulse (!) 52   Ht 5\' 4"  (1.626 m)   Wt 211 lb 6 oz (95.9 kg)   SpO2 96%   BMI 36.28 kg/m  , BMI Body mass index is 36.28 kg/m. Constitutional:  oriented to person, place, and time. No distress.  HENT:  Head: Grossly normal Eyes:  no discharge. No scleral icterus.  Neck: No JVD, no carotid bruits  Cardiovascular: Regular rate and rhythm, no murmurs appreciated Pulmonary/Chest: Clear to auscultation bilaterally, no wheezes or rails Abdominal: Soft.  no distension.  no tenderness.  Musculoskeletal: Normal range of motion Neurological:  normal muscle tone. Coordination normal. No atrophy Skin: Skin warm and dry Psychiatric: normal affect, pleasant   Recent Labs: 02/27/2023: ALT 9; BUN 19; Creatinine, Ser 1.07; Potassium 4.6; Sodium 136    Lipid Panel Lab Results  Component Value Date   CHOL 111 02/27/2023   HDL 59 02/27/2023   LDLCALC 39 02/27/2023   TRIG 57 02/27/2023    Wt Readings from Last 3 Encounters:  10/29/23 211 lb 6 oz (95.9 kg)  08/30/23 208 lb (94.3 kg)  06/14/23 207 lb (93.9 kg)     ASSESSMENT AND PLAN:  Essential hypertension  Blood pressure is well controlled on today's visit. No changes made to the medications.  History of stroke Recent events reviewed Reviewed notes from neurology, MRI head, CT scan head neck We have recommended echocardiogram, also Zio monitor to rule out atrial fibrillation -Referral made to vascular  PAD Carotid disease, vertebral disease, right subclavian stenosis noted Recommend he discuss subclavian stenosis with  vascular  CAD, multiple vessel Moderate to severe left circumflex disease tortuous vessel, occluded RCA Prior catheterization November 2018 Cholesterol at goal, nondiabetic, non-smoker Currently with no symptoms of angina. No further workup at this time. Continue current medication regimen.  Mixed hyperlipidemia Cholesterol is at goal on the current lipid regimen. No changes to the medications were made.  Seizures Previous hospital admissions discussed with him from July 2019 No recent seizures Followed by Dr. Walden Guise    Orders Placed This Encounter  Procedures   EKG 12-Lead     Signed, Juanda Noon, M.D., Ph.D. 10/29/2023  [facility] [place], [place] [phone]

## 2023-10-29 ENCOUNTER — Encounter: Payer: Self-pay | Admitting: Cardiovascular Disease

## 2023-10-29 ENCOUNTER — Ambulatory Visit: Payer: Medicare HMO | Attending: Cardiovascular Disease | Admitting: Cardiovascular Disease

## 2023-10-29 ENCOUNTER — Ambulatory Visit: Payer: Medicare HMO

## 2023-10-29 VITALS — BP 140/78 | HR 52 | Ht 64.0 in | Wt 211.4 lb

## 2023-10-29 DIAGNOSIS — I77819 Aortic ectasia, unspecified site: Secondary | ICD-10-CM | POA: Diagnosis not present

## 2023-10-29 DIAGNOSIS — I639 Cerebral infarction, unspecified: Secondary | ICD-10-CM

## 2023-10-29 DIAGNOSIS — I2510 Atherosclerotic heart disease of native coronary artery without angina pectoris: Secondary | ICD-10-CM | POA: Diagnosis not present

## 2023-10-29 DIAGNOSIS — I10 Essential (primary) hypertension: Secondary | ICD-10-CM | POA: Diagnosis not present

## 2023-10-29 DIAGNOSIS — I5020 Unspecified systolic (congestive) heart failure: Secondary | ICD-10-CM

## 2023-10-29 DIAGNOSIS — E785 Hyperlipidemia, unspecified: Secondary | ICD-10-CM

## 2023-10-29 NOTE — Patient Instructions (Addendum)
 Referral to vascular for CVA, subclavian stenosis, carotid disease   Medication Instructions:  No changes  If you need a refill on your cardiac medications before your next appointment, please call your pharmacy.   Lab work: No new labs needed  Testing/Procedures: Your physician has requested that you have an echocardiogram. Echocardiography is a painless test that uses sound waves to create images of your heart. It provides your doctor with information about the size and shape of your heart and how well your heart's chambers and valves are working.   You may receive an ultrasound enhancing agent through an IV if needed to better visualize your heart during the echo. This procedure takes approximately one hour.  There are no restrictions for this procedure.  This will take place at [address] [facility] [address] [facility], [place] [address]  Please note: We ask at that you not bring children with you during ultrasound (echo/ vascular) testing. Due to room size and safety concerns, children are not allowed in the ultrasound rooms during exams. Our front office staff cannot provide observation of children in our lobby area while testing is being conducted. An adult accompanying a patient to their appointment will only be allowed in the ultrasound room at the discretion of the ultrasound technician under special circumstances. We apologize for any inconvenience.  Follow-Up: At [facility], you and your health needs are our priority.  As part of our continuing mission to provide you with exceptional heart care, we have created designated Provider Care Teams.  These Care Teams include your primary Cardiologist (physician) and Advanced Practice Providers (APPs -  Physician Assistants and Nurse Practitioners) who all work together to provide you with the care you need, when you need it.  Heart Monitor:  Your physician has requested you wear a ZIO monitor for 14  days.  Your monitor  will be mailed to your home address within 3-5 business days. This is sent via [employer] from [employer]. However, if you have not received your monitor after 5 business days please send us  a MyChart message or call the office at [phone], so we may follow up on this for you.   This monitor is a medical device (single patch monitor) that records the heart's electrical activity. Doctors most often use these monitors to diagnose arrhythmias. Arrhythmias are problems with the speed or rhythm of the heartbeat.   [employer] supplies 1 patch per enrollment. Additional stickers are not available.  Please DO NOT apply the patch if you will be having a Nuclear Stress Test, Echocardiogram, Cardiac CT, Cardiac MRI, Chest X-ray during the period you would be wearing the monitor. The patch cannot be worn during these tests.  You cannot remove and re-apply the ZIO patch monitor.   Applying the Monitor: Once you receive your monitor, this will include a small razor, abrader, and 4 alcohol pads. Shave hair from upper left chest Rub abrader disc in 40 strokes over the left upper chest as indicated in your monitor instructions Clean area with 4 enclosed alcohol pads (there may be a mild & brief stinging sensation over the newly abraded area, but this is normal). Let dry Apply patch as indicated in monitor instructions. Patch will be placed under collarbone on the left side of the chest with arrow pointing upward. Rub adhesive wings for 2 minutes. Remove white label marked "1". Remove the white label marked "2". Rub patch adhesive wings for an 2 minutes.  While looking in a mirror,  press and release button in the center of the patch. You may hear a "click". A small green light will flash 4-6 times and then stop. This will be your indicator that the monitor has been turned on.  Wearing the Monitor: Avoid showering during the first 24 hours of wearing the monitor.  After 24 hours you may shower with the  patch on. Take brief showers with your back facing the shower head.  Avoid excessive sweating to help maximize wear time. Do not submerge the device, no hot tubs, and no swimming pools. Keep any lotions or oils away from the patch. Press the button if you feel a symptom. You will hear a small click. Record date, time, and symptoms in the Patient Logbook or App.  Monitor Issues: Call [employer] Customer Care at [phone] if you have questions regarding your Zio Patch Monitor. Call them immediately if you see an orange/ amber colored light blinking on your monitor. If your monitor falls off and you cannot get this reapplied or if you need suggestions for securing your monitor call [employer] at [phone].   Returning the Monitor: Once you have completed wearing your monitor, follow instructions on the last 2 pages of the Patient Logbook. Stick monitor patch on to the last page of the Patient Logbook.  Place Patient Logbook with monitor in the return box provided. Use locking tab on box and tape box closed securely. The return box has pre-paid postage on it.  Place the return box in the regular [place]  Mail box as soon as possible It will take anywhere from 1-2 weeks for your provider to receive and review your results once you mail this back. If for some reason you have misplaced your return box then call our office and we can provide another box and/or mail it off for you.   Billing  and Patient Assistance Program Information: We have supplied [employer] with any of your insurance information on file for billing purposes. [employer] offers a sliding scale Patient Assistance Program for patients that do not have insurance, or whose insurance does not completely cover the cost of the ZIO monitor. You must apply for the Patient Assistance Program to qualify for this discounted rate. To apply, please call [employer] at [phone], select option 1, ask to apply for the Patient Assistance  Program. [employer] will ask your household income, and how many people are in your household. They will quote your out-of-pocket cost based on that information. [employer] will also be able to set up for a 19-month, interest-free payment plan if needed.     You will need a follow up appointment in 6 months, APP ok  Providers on your designated Care Team:   Laneta Pintos, NP Varney Gentleman, PA-C Cadence Gennaro Khat, [place]  COVID-19 Vaccine Information can be found at: [email or web address] For questions related to vaccine distribution or appointments, please email [email or web address] or call [phone].

## 2023-11-05 ENCOUNTER — Telehealth: Payer: Self-pay | Admitting: Family Medicine

## 2023-11-05 NOTE — Telephone Encounter (Signed)
 Copied from CRM [id number]. Topic: Medicare AWV >> Nov 05, 2023  9:03 AM Payton Doughty wrote: Reason for CRM: LVM 11/05/2023 to r/s AWV due to [facility] sched changed. New AWV date 12/06/2023 at 8:40am. Please confirm date change [facility]  Verlee Rossetti; Care Guide Ambulatory Clinical Support [facility] Direct Dial: [phone]

## 2023-11-08 ENCOUNTER — Other Ambulatory Visit: Payer: Self-pay | Admitting: Cardiovascular Disease

## 2023-11-08 DIAGNOSIS — I2510 Atherosclerotic heart disease of native coronary artery without angina pectoris: Secondary | ICD-10-CM

## 2023-11-08 DIAGNOSIS — I5020 Unspecified systolic (congestive) heart failure: Secondary | ICD-10-CM

## 2023-11-08 DIAGNOSIS — I77819 Aortic ectasia, unspecified site: Secondary | ICD-10-CM

## 2023-11-08 DIAGNOSIS — E785 Hyperlipidemia, unspecified: Secondary | ICD-10-CM

## 2023-11-08 DIAGNOSIS — I639 Cerebral infarction, unspecified: Secondary | ICD-10-CM

## 2023-11-08 DIAGNOSIS — I10 Essential (primary) hypertension: Secondary | ICD-10-CM

## 2023-11-17 ENCOUNTER — Encounter (HOSPITAL_COMMUNITY): Payer: Self-pay

## 2023-11-17 ENCOUNTER — Other Ambulatory Visit: Payer: Self-pay

## 2023-11-17 ENCOUNTER — Emergency Department (HOSPITAL_COMMUNITY)

## 2023-11-17 ENCOUNTER — Inpatient Hospital Stay (HOSPITAL_COMMUNITY)
Admission: EM | Admit: 2023-11-17 | Discharge: 2023-11-21 | DRG: 493 | Disposition: A | Discharge status: Home Health | Attending: Internal Medicine | Admitting: Internal Medicine

## 2023-11-17 DIAGNOSIS — Z801 Family history of malignant neoplasm of trachea, bronchus and lung: Secondary | ICD-10-CM

## 2023-11-17 DIAGNOSIS — I639 Cerebral infarction, unspecified: Secondary | ICD-10-CM | POA: Diagnosis not present

## 2023-11-17 DIAGNOSIS — F39 Unspecified mood [affective] disorder: Secondary | ICD-10-CM | POA: Diagnosis present

## 2023-11-17 DIAGNOSIS — D62 Acute posthemorrhagic anemia: Secondary | ICD-10-CM | POA: Diagnosis not present

## 2023-11-17 DIAGNOSIS — S82242A Displaced spiral fracture of shaft of left tibia, initial encounter for closed fracture: Secondary | ICD-10-CM | POA: Diagnosis present

## 2023-11-17 DIAGNOSIS — Z8616 Personal history of COVID-19: Secondary | ICD-10-CM | POA: Diagnosis not present

## 2023-11-17 DIAGNOSIS — I252 Old myocardial infarction: Secondary | ICD-10-CM

## 2023-11-17 DIAGNOSIS — Z79899 Other long term (current) drug therapy: Secondary | ICD-10-CM | POA: Diagnosis not present

## 2023-11-17 DIAGNOSIS — S82433A Displaced oblique fracture of shaft of unspecified fibula, initial encounter for closed fracture: Secondary | ICD-10-CM | POA: Diagnosis present

## 2023-11-17 DIAGNOSIS — Z7902 Long term (current) use of antithrombotics/antiplatelets: Secondary | ICD-10-CM

## 2023-11-17 DIAGNOSIS — W109XXA Fall (on) (from) unspecified stairs and steps, initial encounter: Secondary | ICD-10-CM | POA: Diagnosis present

## 2023-11-17 DIAGNOSIS — I2510 Atherosclerotic heart disease of native coronary artery without angina pectoris: Secondary | ICD-10-CM | POA: Diagnosis present

## 2023-11-17 DIAGNOSIS — S82832A Other fracture of upper and lower end of left fibula, initial encounter for closed fracture: Secondary | ICD-10-CM | POA: Diagnosis present

## 2023-11-17 DIAGNOSIS — Z7982 Long term (current) use of aspirin: Secondary | ICD-10-CM

## 2023-11-17 DIAGNOSIS — Z87891 Personal history of nicotine dependence: Secondary | ICD-10-CM

## 2023-11-17 DIAGNOSIS — Z66 Do not resuscitate: Secondary | ICD-10-CM | POA: Diagnosis present

## 2023-11-17 DIAGNOSIS — E871 Hypo-osmolality and hyponatremia: Secondary | ICD-10-CM | POA: Diagnosis present

## 2023-11-17 DIAGNOSIS — S72009A Fracture of unspecified part of neck of unspecified femur, initial encounter for closed fracture: Secondary | ICD-10-CM | POA: Diagnosis present

## 2023-11-17 DIAGNOSIS — Z8249 Family history of ischemic heart disease and other diseases of the circulatory system: Secondary | ICD-10-CM

## 2023-11-17 DIAGNOSIS — E785 Hyperlipidemia, unspecified: Secondary | ICD-10-CM | POA: Diagnosis present

## 2023-11-17 DIAGNOSIS — E861 Hypovolemia: Secondary | ICD-10-CM | POA: Diagnosis present

## 2023-11-17 DIAGNOSIS — S82209A Unspecified fracture of shaft of unspecified tibia, initial encounter for closed fracture: Secondary | ICD-10-CM | POA: Diagnosis present

## 2023-11-17 DIAGNOSIS — Z8673 Personal history of transient ischemic attack (TIA), and cerebral infarction without residual deficits: Secondary | ICD-10-CM | POA: Diagnosis not present

## 2023-11-17 DIAGNOSIS — I10 Essential (primary) hypertension: Secondary | ICD-10-CM | POA: Diagnosis present

## 2023-11-17 DIAGNOSIS — S82409A Unspecified fracture of shaft of unspecified fibula, initial encounter for closed fracture: Secondary | ICD-10-CM | POA: Diagnosis present

## 2023-11-17 DIAGNOSIS — Z955 Presence of coronary angioplasty implant and graft: Secondary | ICD-10-CM

## 2023-11-17 DIAGNOSIS — S82202A Unspecified fracture of shaft of left tibia, initial encounter for closed fracture: Secondary | ICD-10-CM | POA: Diagnosis not present

## 2023-11-17 DIAGNOSIS — G40909 Epilepsy, unspecified, not intractable, without status epilepticus: Secondary | ICD-10-CM | POA: Diagnosis present

## 2023-11-17 DIAGNOSIS — I739 Peripheral vascular disease, unspecified: Secondary | ICD-10-CM | POA: Diagnosis present

## 2023-11-17 DIAGNOSIS — S82402A Unspecified fracture of shaft of left fibula, initial encounter for closed fracture: Secondary | ICD-10-CM | POA: Diagnosis not present

## 2023-11-17 LAB — CBC
HCT: 38.5 % — ABNORMAL LOW (ref 39.0–52.0)
Hemoglobin: 12.8 g/dL — ABNORMAL LOW (ref 13.0–17.0)
MCH: 31 pg (ref 26.0–34.0)
MCHC: 33.2 g/dL (ref 30.0–36.0)
MCV: 93.2 fL (ref 80.0–100.0)
Platelets: 210 10*3/uL (ref 150–400)
RBC: 4.13 MIL/uL — ABNORMAL LOW (ref 4.22–5.81)
RDW: 13.2 % (ref 11.5–15.5)
WBC: 11.2 10*3/uL — ABNORMAL HIGH (ref 4.0–10.5)
nRBC: 0 % (ref 0.0–0.2)

## 2023-11-17 LAB — BASIC METABOLIC PANEL
Anion gap: 7 (ref 5–15)
BUN: 24 mg/dL — ABNORMAL HIGH (ref 8–23)
CO2: 26 mmol/L (ref 22–32)
Calcium: 9.4 mg/dL (ref 8.9–10.3)
Chloride: 102 mmol/L (ref 98–111)
Creatinine, Ser: 1.3 mg/dL — ABNORMAL HIGH (ref 0.61–1.24)
GFR, Estimated: 58 mL/min — ABNORMAL LOW (ref >60)
Glucose, Bld: 104 mg/dL — ABNORMAL HIGH (ref 70–99)
Potassium: 4.2 mmol/L (ref 3.5–5.1)
Sodium: 135 mmol/L (ref 135–145)

## 2023-11-17 MED ORDER — FENTANYL CITRATE PF 50 MCG/ML IJ SOSY
50.0000 ug | PREFILLED_SYRINGE | Freq: Once | INTRAMUSCULAR | Status: AC
  Administered 2023-11-17: 50 ug via INTRAVENOUS
  Filled 2023-11-17: qty 1

## 2023-11-17 MED ORDER — OXYCODONE-ACETAMINOPHEN 5-325 MG PO TABS
1.0000 | ORAL_TABLET | Freq: Once | ORAL | Status: AC
  Administered 2023-11-17: 1 via ORAL
  Filled 2023-11-17: qty 1

## 2023-11-17 MED ORDER — KETOROLAC TROMETHAMINE 15 MG/ML IJ SOLN
15.0000 mg | Freq: Once | INTRAMUSCULAR | Status: AC
  Administered 2023-11-17: 15 mg via INTRAVENOUS
  Filled 2023-11-17: qty 1

## 2023-11-17 NOTE — ED Provider Notes (Signed)
 [place] EMERGENCY DEPARTMENT AT [facility] Provider Note   CSN: [id number] Arrival date & time: 11/17/23  2103     History  Chief Complaint  Patient presents with   Cornelius Moras Naithan Delage is a 73 y.o. male presented [facility] with a mechanical fall down the stairs at an event show today, striking his left mid tibia on the stairs and now having significant pain and able to bear weight on the leg.  Denies striking his head.  He is not on blood thinners but takes 81 mg aspirin.  He is here with his wife at bedside.  Who presents by EMS, did not receive medicine by EMS  HPI     Home Medications Prior to Admission medications   Medication Sig Start Date End Date Taking? Authorizing Provider  acetaminophen (TYLENOL) 500 MG tablet Take 2 tablets (1,000 mg total) by mouth every 6 (six) hours as needed for mild pain. 05/25/22  Yes Piscoya, Elita Quick, MD  albuterol (VENTOLIN HFA) 108 (90 Base) MCG/ACT inhaler Inhale 2 puffs into the lungs every 6 (six) hours as needed for wheezing or shortness of breath. 08/30/23  Yes Duanne Limerick, MD  aspirin EC 81 MG EC tablet Take 1 tablet (81 mg total) daily by mouth. 07/24/17  Yes Enid Baas, MD  atorvastatin (LIPITOR) 80 MG tablet TAKE 1 TABLET(80 MG) BY MOUTH DAILY AT 6 PM 03/05/23  Yes Gollan, Tollie Pizza, MD  carvedilol (COREG) 3.125 MG tablet Take 1 tablet (3.125 mg total) by mouth 2 (two) times daily. 04/03/23  Yes Gollan, Tollie Pizza, MD  citalopram (CELEXA) 20 MG tablet Take 20 mg by mouth at bedtime.   Yes [provider]  clopidogrel (PLAVIX) 75 MG tablet Take 75 mg once daily for 21 days and then discontinue.  Monitor for signs of bleeding or bruising. 09/26/23  Yes [provider]  donepezil (ARICEPT) 5 MG tablet Take 5 mg by mouth at bedtime.   Yes [provider]  EPINEPHrine 0.3 mg/0.3 mL IJ SOAJ injection Inject 0.3 mg into the muscle as needed for anaphylaxis. 08/01/21  Yes Duanne Limerick, MD  fexofenadine (ALLEGRA) 180 MG tablet Take 180 mg by mouth as needed for allergies or rhinitis. otc   Yes [provider]  lamoTRIgine (LAMICTAL) 200 MG tablet Take 200 mg by mouth 2 (two) times daily.   Yes [provider]  losartan (COZAAR) 50 MG tablet Take 1 tablet (50 mg total) by mouth 2 (two) times daily. 03/05/23  Yes Gollan, Tollie Pizza, MD  nitroGLYCERIN (NITROSTAT) 0.4 MG SL tablet PLACE 1 TABLET UNDER THE TONGUE EVERY 5 MINUTES FOR 3 DOSES AS NEEDED FOR CHEST PAIN 05/22/23  Yes Gollan, Tollie Pizza, MD  spironolactone (ALDACTONE) 25 MG tablet Take 1 tablet (25 mg total) by mouth daily. 03/05/23  Yes Antonieta Iba, MD      Allergies    Patient has no known allergies.    Review of Systems   Review of Systems  Physical Exam Updated Vital Signs BP (!) 153/74   Pulse 61   Temp 98.1 F (36.7 C) (Oral)   Resp 18   SpO2 98%  Physical Exam Constitutional:      General: He is not in acute distress. HENT:     Head: Normocephalic and atraumatic.  Eyes:     Conjunctiva/sclera: Conjunctivae normal.     Pupils: Pupils are equal, round, and reactive to light.  Cardiovascular:  Rate and Rhythm: Normal rate and regular rhythm.     Pulses: Normal pulses.  Pulmonary:     Effort: Pulmonary effort is normal. No respiratory distress.  Abdominal:     General: There is no distension.     Tenderness: There is no abdominal tenderness.  Musculoskeletal:     Comments: Mid tibial deformity with mild tenting of the skin  Skin:    General: Skin is warm and dry.  Neurological:     General: No focal deficit present.     Mental Status: He is alert. Mental status is at baseline.     Comments: Distal foot sensation intact  Psychiatric:        Mood and Affect: Mood normal.        Behavior: Behavior normal.     ED Results / Procedures / Treatments   Labs (all labs ordered are listed, but only abnormal results are displayed) Labs Reviewed  BASIC METABOLIC  PANEL - Abnormal; Notable for the following components:      Result Value   Glucose, Bld 104 (*)    BUN 24 (*)    Creatinine, Ser 1.30 (*)    GFR, Estimated 58 (*)    All other components within normal limits  CBC - Abnormal; Notable for the following components:   WBC 11.2 (*)    RBC 4.13 (*)    Hemoglobin 12.8 (*)    HCT 38.5 (*)    All other components within normal limits    EKG None  Radiology DG Tibia/Fibula Left Result Date: 11/17/2023 CLINICAL DATA:  Deformity after falling down steps.  Flank pain. EXAM: LEFT TIBIA AND FIBULA - 2 VIEW COMPARISON:  None Available. FINDINGS: Acute comminuted spiral fracture of the mid-distal left tibial diaphysis. Posterior displacement of the distal fragment 1/2 shaft width. Slight apex anterior angulation. Additional displaced oblique fracture of the proximal fibular diaphysis. Slight posterior displacement of the distal fragment with apex lateral angulation. IMPRESSION: 1. Acute comminuted spiral fracture of the mid-distal left tibial diaphysis. 2. Acute displaced oblique fracture of the proximal fibular diaphysis. Electronically Signed   By: Minerva Fester M.D.   On: 11/17/2023 21:58    Procedures Procedures    Medications Ordered in ED Medications  oxyCODONE-acetaminophen (PERCOCET/ROXICET) 5-325 MG per tablet 1 tablet (1 tablet Oral Given 11/17/23 2156)  ketorolac (TORADOL) 15 MG/ML injection 15 mg (15 mg Intravenous Given 11/17/23 2156)  fentaNYL (SUBLIMAZE) injection 50 mcg (50 mcg Intravenous Given 11/17/23 2236)    ED Course/ Medical Decision Making/ A&P Clinical Course as of 11/17/23 2318  Sat Nov 17, 2023  2148 Spiral fracture of left tibia and fibula [MT]  2227 Dr Linna Caprice - requesting posterior long leg splinting with straightening of tibia - post splinting CT imaging - hospitalist admission and transfer to [facility] for ortho trauma evaluation.   Likely OR on Monday [MT]  2249 Pt placed in posterior long leg and stirrup cast,  angulation straightened [MT]    Clinical Course User Index [MT] Gregrey Bloyd, Kermit Balo, MD                                 Medical Decision Making Amount and/or Complexity of Data Reviewed Labs: ordered. Radiology: ordered.  Risk Prescription drug management. Decision regarding hospitalization.   Patient is here with an isolated your left lower leg.  X-rays ordered persevered by myself, notable for comminuted spiral fracture of the tibia and  proximal fracture of the fibula  Supplement history is provided by the patient's wife as well as EMS.  Patient was offered pain medication but refused this initially in the ED.  He is neurovascularly intact on arrival.  No other traumatic injuries noted on exam  Patient's basic blood work reviewed with no emergent findings.  He was given pain medicine and I consulted with orthopedic surgery, see ED course.  Subsequently the patient's leg was straightened and placed in a posterior long-leg splint.  Per orthopedic surgeons request a bordered preoperative CT imaging to help with operative planning.  He was admitted to the hospitalist with the plan to transfer to [facility] - Dr Linna Caprice will be conferring with trauma orthopedic surgeons regarding OR for the patient, most likely on Monday.    Patient and his wife were made aware.  Following splinting and reassess his vascular status.  Cap refills in the toes and pedal pulse were intact.  His compartments remain soft throughout the entire procedure, I have a low suspicion for compartment syndrome.  His pain is fairly well under control with these minimal pain medications.        Final Clinical Impression(s) / ED Diagnoses Final diagnoses:  Closed displaced spiral fracture of shaft of left tibia, initial encounter  Closed fracture of proximal end of left fibula, unspecified fracture morphology, initial encounter    Rx / DC Orders ED Discharge Orders     None         Terald Sleeper, MD 11/17/23 2319

## 2023-11-17 NOTE — ED Notes (Signed)
 Trifan, MD and Ortho tech in room splinting pt left leg

## 2023-11-17 NOTE — ED Triage Notes (Signed)
 Pt. From [place] BIB EMS for a fall. Injury to the L. Leg. Deformity noted by EMS. VSS with EMS.

## 2023-11-17 NOTE — Progress Notes (Signed)
 Orthopedic Tech Progress Note Patient Details:  George Doyle 05-24-1951 [id number]  Ortho Devices Type of Ortho Device: Post (long leg) splint, Stirrup splint Ortho Device/Splint Location: lle posterior long leg splint with stirrups Ortho Device/Splint Interventions: Ordered, Application, Adjustment  I applied the splint with the drs assist. Post Interventions Patient Tolerated: Well Instructions Provided: Care of device, Adjustment of device  Trinna Post 11/17/2023, 11:30 PM

## 2023-11-17 NOTE — Plan of Care (Incomplete)
 73 year old male with history of hypertension, CVA, PAD, CAD, hyperlipidemia, history of SDH in 2019 secondary to fall related to seizure presented to the ED via EMS for evaluation of left lower leg injury secondary to a fall.  Vital signs on arrival: Temperature 98.1 F, pulse 67, respiratory rate 18, blood pressure 155/88, and SpO2 97% on room air.  Labs showing WBC count 11.2, hemoglobin 12.8, creatinine 1.3 (baseline 1.0-1.2).  X-ray showing acute comminuted spiral fracture of the mid-distal left tibial diaphysis and acute displaced oblique fracture of the proximal fibular diaphysis.  Patient takes aspirin at home but no anticoagulation.  Left lower extremity neurovascularly intact on exam done by ED physician.  Patient was given fentanyl, Toradol, and Percocet for pain.  ED physician discussed the case with Dr. Linna Caprice with [facility] who will discuss the case with orthopedic trauma surgeon at [facility].  He recommended admitting the patient to [facility] by hospitalist service and orthopedics will likely do surgery on Monday.  He also recommended placing the patient's leg in a splint which was done by ED physician.  In addition, recommended obtaining CT of the leg for operative planning.  TRH called to admit.

## 2023-11-18 DIAGNOSIS — S82202A Unspecified fracture of shaft of left tibia, initial encounter for closed fracture: Secondary | ICD-10-CM

## 2023-11-18 DIAGNOSIS — S72009A Fracture of unspecified part of neck of unspecified femur, initial encounter for closed fracture: Secondary | ICD-10-CM | POA: Diagnosis present

## 2023-11-18 DIAGNOSIS — I739 Peripheral vascular disease, unspecified: Secondary | ICD-10-CM

## 2023-11-18 DIAGNOSIS — S82402A Unspecified fracture of shaft of left fibula, initial encounter for closed fracture: Secondary | ICD-10-CM | POA: Diagnosis not present

## 2023-11-18 LAB — COMPREHENSIVE METABOLIC PANEL
ALT: 13 U/L (ref 0–44)
AST: 19 U/L (ref 15–41)
Albumin: 3.5 g/dL (ref 3.5–5.0)
Alkaline Phosphatase: 62 U/L (ref 38–126)
Anion gap: 7 (ref 5–15)
BUN: 24 mg/dL — ABNORMAL HIGH (ref 8–23)
CO2: 23 mmol/L (ref 22–32)
Calcium: 8.7 mg/dL — ABNORMAL LOW (ref 8.9–10.3)
Chloride: 104 mmol/L (ref 98–111)
Creatinine, Ser: 1.06 mg/dL (ref 0.61–1.24)
GFR, Estimated: >60 mL/min (ref >60)
Glucose, Bld: 96 mg/dL (ref 70–99)
Potassium: 4 mmol/L (ref 3.5–5.1)
Sodium: 134 mmol/L — ABNORMAL LOW (ref 135–145)
Total Bilirubin: 0.8 mg/dL (ref 0.0–1.2)
Total Protein: 6 g/dL — ABNORMAL LOW (ref 6.5–8.1)

## 2023-11-18 LAB — CBC
HCT: 37 % — ABNORMAL LOW (ref 39.0–52.0)
Hemoglobin: 12 g/dL — ABNORMAL LOW (ref 13.0–17.0)
MCH: 30.6 pg (ref 26.0–34.0)
MCHC: 32.4 g/dL (ref 30.0–36.0)
MCV: 94.4 fL (ref 80.0–100.0)
Platelets: 192 10*3/uL (ref 150–400)
RBC: 3.92 MIL/uL — ABNORMAL LOW (ref 4.22–5.81)
RDW: 13.3 % (ref 11.5–15.5)
WBC: 9.4 10*3/uL (ref 4.0–10.5)
nRBC: 0 % (ref 0.0–0.2)

## 2023-11-18 MED ORDER — HYDROMORPHONE HCL 1 MG/ML IJ SOLN
1.0000 mg | INTRAMUSCULAR | Status: DC | PRN
  Administered 2023-11-18 – 2023-11-19 (×6): 1 mg via INTRAVENOUS
  Filled 2023-11-18 (×6): qty 1

## 2023-11-18 MED ORDER — SODIUM CHLORIDE 0.9 % IV SOLN: INTRAVENOUS | Status: AC

## 2023-11-18 MED ORDER — DONEPEZIL HCL 5 MG PO TABS
5.0000 mg | ORAL_TABLET | Freq: Every day | ORAL | Status: DC
  Administered 2023-11-18 – 2023-11-20 (×4): 5 mg via ORAL
  Filled 2023-11-18 (×4): qty 1

## 2023-11-18 MED ORDER — CITALOPRAM HYDROBROMIDE 20 MG PO TABS
20.0000 mg | ORAL_TABLET | Freq: Every day | ORAL | Status: DC
  Administered 2023-11-18 – 2023-11-20 (×4): 20 mg via ORAL
  Filled 2023-11-18: qty 2
  Filled 2023-11-18 (×3): qty 1

## 2023-11-18 MED ORDER — ACETAMINOPHEN 325 MG PO TABS
650.0000 mg | ORAL_TABLET | Freq: Four times a day (QID) | ORAL | Status: DC | PRN
  Administered 2023-11-18 (×2): 650 mg via ORAL
  Filled 2023-11-18 (×2): qty 2

## 2023-11-18 MED ORDER — HYDROMORPHONE HCL 1 MG/ML IJ SOLN
1.0000 mg | Freq: Once | INTRAMUSCULAR | Status: AC
  Administered 2023-11-18: 1 mg via INTRAVENOUS
  Filled 2023-11-18: qty 1

## 2023-11-18 MED ORDER — ACETAMINOPHEN 650 MG RE SUPP: 650.0000 mg | Freq: Four times a day (QID) | RECTAL | Status: DC | PRN

## 2023-11-18 MED ORDER — ATORVASTATIN CALCIUM 80 MG PO TABS
80.0000 mg | ORAL_TABLET | Freq: Every day | ORAL | Status: DC
  Administered 2023-11-18 – 2023-11-21 (×4): 80 mg via ORAL
  Filled 2023-11-18: qty 2
  Filled 2023-11-18 (×4): qty 1

## 2023-11-18 MED ORDER — CARVEDILOL 3.125 MG PO TABS
3.1250 mg | ORAL_TABLET | Freq: Two times a day (BID) | ORAL | Status: DC
Start: 2023-11-18 — End: 2023-11-18
  Administered 2023-11-18: 3.125 mg via ORAL
  Filled 2023-11-18: qty 1

## 2023-11-18 MED ORDER — LAMOTRIGINE 100 MG PO TABS
200.0000 mg | ORAL_TABLET | Freq: Two times a day (BID) | ORAL | Status: DC
  Administered 2023-11-18 – 2023-11-21 (×8): 200 mg via ORAL
  Filled 2023-11-18 (×8): qty 2

## 2023-11-18 MED ORDER — NALOXONE HCL 0.4 MG/ML IJ SOLN: 0.4000 mg | INTRAMUSCULAR | Status: DC | PRN

## 2023-11-18 NOTE — Consult Note (Signed)
 ORTHOPAEDIC CONSULTATION  REQUESTING PHYSICIAN: Trifan, Kermit Balo, MD  PCP:  Duanne Limerick, MD  Chief Complaint: Left lower extremity injury  HPI: George Doyle is a 73 y.o. male who complains of left lower extremity injury.  Patient was at the [facility] last night, when he tripped and fell on the stairs.  He had left lower extremity pain and inability to weight-bear.  He was brought to the emergency department at [facility], where x-rays revealed a comminuted distal third left tibia shaft fracture.  He was placed in a long-leg splint.  He denies other injuries.  Patient states that his only pain right now is under the heel.  Past Medical History:  Diagnosis Date   Allergy    Arthritis    knees, hands   Ascending aorta dilatation (HCC) 07/21/2017   a.) TTE 07/21/2017: Ao root 36 mm, asc Ao 41 mm; b.) TTE 10/22/2020: asc Ao 39 mm   Coronary artery disease    Diastolic dysfunction 07/21/2017   a.) TTE 07/21/2017 (following NSTEMI): EF 50-55%, inf HK, mild MR, G1DD   Dyspnea    Former tobacco use    History of 2019 novel coronavirus disease (COVID-19) 10/21/2020   HLD (hyperlipidemia)    Hypertension    NSTEMI (non-ST elevated myocardial infarction) (HCC) 07/20/2017   a.) CP started 5 days prior to presentation (approx 07/15/2017); b.) LHC 07/23/2017: 100% p-d RCA, 60% p-mLCx (tortuous), 99% post atrio --> unsuccessful PCI; c.) referred to interventionist Eldridge Dace) in [place] on 08/30/2017 --> med mgmt recommended   NSTEMI (non-ST elevated myocardial infarction) (HCC) 10/23/2020   a.) Troponins trended: 307-->1304--> 6774 --> 6895 --> 5886 --> 5324; b.) LHC 10/23/2020: EF >55%; 40% pLCx, CTO pRCA --> med mgmt.   Pneumonia    SDH (subdural hematoma) (HCC) 03/27/2018   a.) 6 mm frontal SDH s/p fall related to tonic-clonic seizure   Seizures (HCC) 1970   a.) last documented seizure was in 10/2020   Umbilical hernia    Vasovagal syncope    Past  Surgical History:  Procedure Laterality Date   BICEPT TENODESIS Right 07/18/2018   Procedure: BICEPS TENODESIS;  Surgeon: Christena Flake, MD;  Location: [facility] ORS;  Service: Orthopedics;  Laterality: Right;   CARDIAC CATHETERIZATION     COLONOSCOPY  2011   cleared for 10 yrs   COLONOSCOPY WITH PROPOFOL N/A 11/06/2016   Procedure: COLONOSCOPY WITH PROPOFOL;  Surgeon: Midge Minium, MD;  Location: [facility];  Service: Endoscopy;  Laterality: N/A;   COLONOSCOPY WITH PROPOFOL N/A 05/01/2023   Procedure: COLONOSCOPY WITH PROPOFOL;  Surgeon: Midge Minium, MD;  Location: [facility] ENDOSCOPY;  Service: Endoscopy;  Laterality: N/A;   CORONARY STENT INTERVENTION N/A 07/23/2017   Procedure: CORONARY STENT INTERVENTION;  Surgeon: Alwyn Pea, MD;  Location: [facility] INVASIVE CV LAB;  Service: Cardiovascular;  Laterality: N/A;   CYST EXCISION     Forearm   INGUINAL HERNIA REPAIR Left    INSERTION OF MESH  05/25/2022   Procedure: INSERTION OF MESH;  Surgeon: Henrene Dodge, MD;  Location: [facility] ORS;  Service: General;;   KNEE SURGERY Bilateral    2 on R) 1 on L)   LEFT HEART CATH AND CORONARY ANGIOGRAPHY N/A 07/23/2017   Procedure: LEFT HEART CATH AND CORONARY ANGIOGRAPHY;  Surgeon: Antonieta Iba, MD;  Location: [facility] INVASIVE CV LAB;  Service: Cardiovascular;  Laterality: N/A;   LEFT HEART CATH AND CORONARY ANGIOGRAPHY Left 10/23/2020   Procedure: LEFT HEART CATH  AND CORONARY ANGIOGRAPHY; Location: [facility]   POLYPECTOMY  11/06/2016   Procedure: POLYPECTOMY;  Surgeon: Midge Minium, MD;  Location: [facility];  Service: Endoscopy;;   SHOULDER ARTHROSCOPY WITH OPEN ROTATOR CUFF REPAIR Right 07/18/2018   Procedure: SHOULDER ARTHROSCOPY WITH OPEN ROTATOR CUFF REPAIR;  Surgeon: Christena Flake, MD;  Location: [facility] ORS;  Service: Orthopedics;  Laterality: Right;   Social History   Socioeconomic History   Marital status: Married    Spouse name: Okey Regal   Number of children: 2   Years of education:  Not on file   Highest education level: Not on file  Occupational History   Not on file  Tobacco Use   Smoking status: Former    Types: Cigarettes   Smokeless tobacco: Never   Tobacco comments:    quit over 20 yrs ago  Vaping Use   Vaping status: Never Used  Substance and Sexual Activity   Alcohol use: Yes    Alcohol/week: 0.0 standard drinks of alcohol    Comment: 1-2 drinks/year   Drug use: No   Sexual activity: Not Currently  Other Topics Concern   Not on file  Social History Narrative   Not on file   Social Drivers of Health   Financial Resource Strain: Low Risk  (11/15/2022)   Overall Financial Resource Strain (CARDIA)    Difficulty of Paying Living Expenses: Not hard at all  Food Insecurity: No Food Insecurity (11/15/2022)   Hunger Vital Sign    Worried About Running Out of Food in the Last Year: Never true    Ran Out of Food in the Last Year: Never true  Transportation Needs: No Transportation Needs (11/15/2022)   PRAPARE - Administrator, Civil Service (Medical): No    Lack of Transportation (Non-Medical): No  Physical Activity: Sufficiently Active (11/15/2022)   Exercise Vital Sign    Days of Exercise per Week: 7 days    Minutes of Exercise per Session: 30 min  Stress: No Stress Concern Present (11/15/2022)   [employer] of Occupational Health - Occupational Stress Questionnaire    Feeling of Stress : Not at all  Social Connections: Moderately Isolated (11/15/2022)   Social Connection and Isolation Panel [NHANES]    Frequency of Communication with Friends and Family: Once a week    Frequency of Social Gatherings with Friends and Family: More than three times a week    Attends Religious Services: Never    Database administrator or Organizations: No    Attends Engineer, structural: Never    Marital Status: Married   Family History  Problem Relation Age of Onset   CAD Father    Lung cancer Father    Heart failure Mother    No Known  Allergies Prior to Admission medications   Medication Sig Start Date End Date Taking? Authorizing Provider  acetaminophen (TYLENOL) 500 MG tablet Take 2 tablets (1,000 mg total) by mouth every 6 (six) hours as needed for mild pain. 05/25/22  Yes Piscoya, Elita Quick, MD  albuterol (VENTOLIN HFA) 108 (90 Base) MCG/ACT inhaler Inhale 2 puffs into the lungs every 6 (six) hours as needed for wheezing or shortness of breath. 08/30/23  Yes Duanne Limerick, MD  aspirin EC 81 MG EC tablet Take 1 tablet (81 mg total) daily by mouth. 07/24/17  Yes Enid Baas, MD  atorvastatin (LIPITOR) 80 MG tablet TAKE 1 TABLET(80 MG) BY MOUTH DAILY AT 6 PM 03/05/23  Yes Gollan, Tollie Pizza, MD  carvedilol (COREG) 3.125 MG tablet Take 1 tablet (3.125 mg total) by mouth 2 (two) times daily. 04/03/23  Yes Gollan, Tollie Pizza, MD  citalopram (CELEXA) 20 MG tablet Take 20 mg by mouth at bedtime.   Yes [provider]  clopidogrel (PLAVIX) 75 MG tablet Take 75 mg once daily for 21 days and then discontinue.  Monitor for signs of bleeding or bruising. 09/26/23  Yes [provider]  donepezil (ARICEPT) 5 MG tablet Take 5 mg by mouth at bedtime.   Yes [provider]  EPINEPHrine 0.3 mg/0.3 mL IJ SOAJ injection Inject 0.3 mg into the muscle as needed for anaphylaxis. 08/01/21  Yes Duanne Limerick, MD  fexofenadine (ALLEGRA) 180 MG tablet Take 180 mg by mouth as needed for allergies or rhinitis. otc   Yes [provider]  lamoTRIgine (LAMICTAL) 200 MG tablet Take 200 mg by mouth 2 (two) times daily.   Yes [provider]  losartan (COZAAR) 50 MG tablet Take 1 tablet (50 mg total) by mouth 2 (two) times daily. 03/05/23  Yes Gollan, Tollie Pizza, MD  nitroGLYCERIN (NITROSTAT) 0.4 MG SL tablet PLACE 1 TABLET UNDER THE TONGUE EVERY 5 MINUTES FOR 3 DOSES AS NEEDED FOR CHEST PAIN 05/22/23  Yes Gollan, Tollie Pizza, MD  spironolactone (ALDACTONE) 25 MG tablet Take 1 tablet (25 mg total) by mouth daily. 03/05/23  Yes  Antonieta Iba, MD   CT Tibia Fibula Left Wo Contrast Result Date: 11/18/2023 CLINICAL DATA:  Lower leg trauma spiral fracture - CT imaging of left tibia/fibula and left ankle requested by orthopedic surgeon; Ankle trauma, fracture, xray done (Age >= 5y) EXAM: CT OF THE LOWER LEFT EXTREMITY AND ANKLE WITHOUT CONTRAST TECHNIQUE: Multidetector CT imaging of the lower left extremity and ankle was performed according to the standard protocol. RADIATION DOSE REDUCTION: This exam was performed according to the departmental dose-optimization program which includes automated exposure control, adjustment of the mA and/or kV according to patient size and/or use of iterative reconstruction technique. COMPARISON:  X-ray left tibia fibula 11/17/2023 FINDINGS: Bones/Joint/Cartilage Acute comminuted undisplaced mid to distal tibial shaft fracture. Acute full shaft with displaced diagonal proximal fibular shaft fracture. No acute fracture of the bones of the ankle. No acute displaced fracture or dislocation of the visualized bones of the knee. No severe degenerative changes. Ligaments Suboptimally assessed by CT. Muscles and Tendons Grossly unremarkable. Soft tissues Subcutaneus soft tissue edema.  Vascular calcification. IMPRESSION: 1. Acute comminuted undisplaced mid to distal tibial shaft fracture. 2. Acute full shaft with displaced diagonal proximal fibular shaft fracture. 3. No acute fracture of the bones of the ankle. Electronically Signed   By: Tish Frederickson M.D.   On: 11/18/2023 00:01   CT Ankle Left Wo Contrast Result Date: 11/18/2023 CLINICAL DATA:  Lower leg trauma spiral fracture - CT imaging of left tibia/fibula and left ankle requested by orthopedic surgeon; Ankle trauma, fracture, xray done (Age >= 5y) EXAM: CT OF THE LOWER LEFT EXTREMITY AND ANKLE WITHOUT CONTRAST TECHNIQUE: Multidetector CT imaging of the lower left extremity and ankle was performed according to the standard protocol. RADIATION DOSE  REDUCTION: This exam was performed according to the departmental dose-optimization program which includes automated exposure control, adjustment of the mA and/or kV according to patient size and/or use of iterative reconstruction technique. COMPARISON:  X-ray left tibia fibula 11/17/2023 FINDINGS: Bones/Joint/Cartilage Acute comminuted undisplaced mid to distal tibial shaft fracture. Acute full shaft with displaced diagonal proximal fibular shaft fracture. No acute fracture of the  bones of the ankle. No acute displaced fracture or dislocation of the visualized bones of the knee. No severe degenerative changes. Ligaments Suboptimally assessed by CT. Muscles and Tendons Grossly unremarkable. Soft tissues Subcutaneus soft tissue edema.  Vascular calcification. IMPRESSION: 1. Acute comminuted undisplaced mid to distal tibial shaft fracture. 2. Acute full shaft with displaced diagonal proximal fibular shaft fracture. 3. No acute fracture of the bones of the ankle. Electronically Signed   By: Tish Frederickson M.D.   On: 11/18/2023 00:01   DG Tibia/Fibula Left Result Date: 11/17/2023 CLINICAL DATA:  Deformity after falling down steps.  Flank pain. EXAM: LEFT TIBIA AND FIBULA - 2 VIEW COMPARISON:  None Available. FINDINGS: Acute comminuted spiral fracture of the mid-distal left tibial diaphysis. Posterior displacement of the distal fragment 1/2 shaft width. Slight apex anterior angulation. Additional displaced oblique fracture of the proximal fibular diaphysis. Slight posterior displacement of the distal fragment with apex lateral angulation. IMPRESSION: 1. Acute comminuted spiral fracture of the mid-distal left tibial diaphysis. 2. Acute displaced oblique fracture of the proximal fibular diaphysis. Electronically Signed   By: Minerva Fester M.D.   On: 11/17/2023 21:58    Positive ROS: All other systems have been reviewed and were otherwise negative with the exception of those mentioned in the HPI and as  above.  Physical Exam: General: Alert, no acute distress Cardiovascular: No pedal edema Respiratory: No cyanosis, no use of accessory musculature GI: No organomegaly, abdomen is soft and non-tender Skin: No lesions in the area of chief complaint Neurologic: Sensation intact distally Psychiatric: Patient is competent for consent with normal mood and affect Lymphatic: No axillary or cervical lymphadenopathy  MUSCULOSKELETAL: Examination of the left lower extremity reveals a long-leg splint intact.  I took the splint down.  No skin wounds or lesions.  Mild swelling.  Compartments are soft and compressible.  He has active dorsiflexion, plantarflexion, and great toe extension.  Sensation is intact to light touch in all distributions.  2+ DP pulse present.  ABD pads were added under the heel, and I replaced his splint.  No impending compartment syndrome.  Assessment: Closed comminuted left distal third tibia fracture.  Plan: I discussed the findings with the patient and his wife.  We discussed the x-rays and CT scan.  Patient requires surgical fixation for his unstable left tibia fracture.  Request [facility] admit at [facility].  I will discuss the patient with my orthopedic trauma colleagues.  For now, elevate toes above nose.  Ice as needed.  Nonweightbearing left lower extremity.  Continue splint.  Patient may have diet today, and then n.p.o. after midnight.    Jonette Pesa, MD [phone]    11/18/2023 8:08 AM

## 2023-11-18 NOTE — Plan of Care (Signed)

## 2023-11-18 NOTE — ED Provider Notes (Signed)
 I went back into the room to reassess the patient as he reported worsening pain in his leg after movement to and fro the CT scanner.  His compartments remain soft and intact, he continues to have a palpable pedal pulse with brisk cap refill.  I suspect the fentanyl s likely worn off and so I have ordered IV Dilaudid for him at this point.  Patient is admitted to the hospitalist   Salman Wellen, Kermit Balo, MD 11/18/23 [phone]

## 2023-11-18 NOTE — Hospital Course (Addendum)
 Brief Narrative:  73 year old with history of HTN, CVA, PAD, CAD, HLD, SDH in 2019.  Patient fell at [employer] last night when he tripped over stairs sustaining left-sided tibial fracture, long-leg splint was placed.  Orthopedic requested transferring patient to [facility] for surgical fixation by trauma team.   Assessment & Plan:  Principal Problem:   Tibia/fibula fracture Active Problems:   Essential hypertension   Hyperlipidemia   CAD (coronary artery disease), native coronary artery   Seizure disorder (HCC)   PAD (peripheral artery disease) (HCC)   Hip fracture (HCC)    Acute comminuted undisplaced mid to distal tibial shaft fracture  Acute full shaft with displaced diagonal proximal fibular shaft fracture Mechanical fall.  Transfer patient to [facility] for further surgical intervention.  Management per their service.   CAD status post PCI No anginal symptoms.  Overnight sinus bradycardia when sleeping.  No further workup necessary at this time.   History of CVA, January 2025 PAD Hold aspirin at this time.  Continue Lipitor.   Hypertension IV as needed   Hyperlipidemia Continue Lipitor.   Seizure disorder Continue lamotrigine.   Mood disorder Continue citalopram.   DVT prophylaxis: SCDs Start: 11/18/23 0059     Code Status: Do not attempt resuscitation (DNR) PRE-ARREST INTERVENTIONS DESIRED Family Communication:   Status is: Inpatient Remains inpatient appropriate because: Transfer to [facility] for ortho surgical eval.     Subjective: Doing ok, no complaints besides leg pain   Examination:  General exam: Appears calm and comfortable  Respiratory system: Clear to auscultation. Respiratory effort normal. Cardiovascular system: S1 & S2 heard, RRR. No JVD, murmurs, rubs, gallops or clicks. No pedal edema. Gastrointestinal system: Abdomen is nondistended, soft and nontender. No organomegaly or masses felt. Normal bowel sounds heard. Central nervous  system: Alert and oriented. No focal neurological deficits. Extremities: Symmetric 5 x 5 power. Skin: No rashes, lesions or ulcers. LLE in cast.  Psychiatry: Judgement and insight appear normal. Mood & affect appropriate.

## 2023-11-18 NOTE — H&P (Signed)
 History and Physical    George Doyle QMV:[id number] DOB: Dec 11, 1950 DOA: 11/17/2023  PCP: Duanne Limerick, MD  Patient coming from: Home  Chief Complaint: Fall, left leg pain  HPI: George Doyle is a 73 y.o. male with medical history significant of hypertension, CVA, PAD, CAD, hyperlipidemia, history of SDH in 2019 secondary to fall related to seizure presented to the ED via EMS for evaluation of left lower leg injury secondary to a fall.  Vital signs on arrival: Temperature 98.1 F, pulse 67, respiratory rate 18, blood pressure 155/88, and SpO2 97% on room air.  Labs showing WBC count 11.2, hemoglobin 12.8, creatinine 1.3 (baseline 1.0-1.2).  X-ray showing acute comminuted spiral fracture of the mid-distal left tibial diaphysis and acute displaced oblique fracture of the proximal fibular diaphysis.  Patient takes aspirin at home but no anticoagulation.  Left lower extremity neurovascularly intact on exam done by ED physician.  ED physician discussed the case with Dr. Linna Caprice with [facility] who will discuss the case with orthopedic trauma surgeon at [facility].  He recommended admitting the patient to [facility] by hospitalist service and placing the patient's leg in a splint.  In addition, recommended obtaining CT of the leg for operative planning.    CT of left tibia/fibula/ankle showing: "IMPRESSION: 1. Acute comminuted undisplaced mid to distal tibial shaft fracture. 2. Acute full shaft with displaced diagonal proximal fibular shaft fracture. 3. No acute fracture of the bones of the ankle."  Patient was given fentanyl, Toradol, Dilaudid, and Percocet for pain.  TRH called to admit.    Patient states he was going down some steps when he missed a step and fell injuring his left lower leg.  He was not able to get up from the floor or walk due to significant pain.  Other than the fall tonight, he reports feeling well otherwise.  Denies fevers, chills,  cough, shortness of breath, chest pain, nausea, vomiting, abdominal pain, diarrhea or any urinary symptoms.  Review of Systems:  Review of Systems  All other systems reviewed and are negative.   Past Medical History:  Diagnosis Date   Allergy    Arthritis    knees, hands   Ascending aorta dilatation (HCC) 07/21/2017   a.) TTE 07/21/2017: Ao root 36 mm, asc Ao 41 mm; b.) TTE 10/22/2020: asc Ao 39 mm   Coronary artery disease    Diastolic dysfunction 07/21/2017   a.) TTE 07/21/2017 (following NSTEMI): EF 50-55%, inf HK, mild MR, G1DD   Dyspnea    Former tobacco use    History of 2019 novel coronavirus disease (COVID-19) 10/21/2020   HLD (hyperlipidemia)    Hypertension    NSTEMI (non-ST elevated myocardial infarction) (HCC) 07/20/2017   a.) CP started 5 days prior to presentation (approx 07/15/2017); b.) LHC 07/23/2017: 100% p-d RCA, 60% p-mLCx (tortuous), 99% post atrio --> unsuccessful PCI; c.) referred to interventionist Eldridge Dace) in [place] on 08/30/2017 --> med mgmt recommended   NSTEMI (non-ST elevated myocardial infarction) (HCC) 10/23/2020   a.) Troponins trended: 307-->1304--> 6774 --> 6895 --> 5886 --> 5324; b.) LHC 10/23/2020: EF >55%; 40% pLCx, CTO pRCA --> med mgmt.   Pneumonia    SDH (subdural hematoma) (HCC) 03/27/2018   a.) 6 mm frontal SDH s/p fall related to tonic-clonic seizure   Seizures (HCC) 1970   a.) last documented seizure was in 10/2020   Umbilical hernia    Vasovagal syncope     Past Surgical History:  Procedure Laterality Date  BICEPT TENODESIS Right 07/18/2018   Procedure: BICEPS TENODESIS;  Surgeon: Christena Flake, MD;  Location: [facility] ORS;  Service: Orthopedics;  Laterality: Right;   CARDIAC CATHETERIZATION     COLONOSCOPY  2011   cleared for 10 yrs   COLONOSCOPY WITH PROPOFOL N/A 11/06/2016   Procedure: COLONOSCOPY WITH PROPOFOL;  Surgeon: Midge Minium, MD;  Location[facility];  Service: Endoscopy;  Laterality: N/A;   COLONOSCOPY WITH  PROPOFOL N/A 05/01/2023   Procedure: COLONOSCOPY WITH PROPOFOL;  Surgeon: Midge Minium, MD;  Location: [facility] END[facility]COPY;  Service: Endoscopy;  Laterality: N/A;   CORONARY STENT INTERVENTION N/A 07/23/2017   Procedure: CORONARY STENT INTERVENTION;  Surgeon: Alwyn Pea, MD;  Location: [facility] INVASIVE CV LAB;  Service: Cardiovascular;  Laterality: N/A;   CYST EXCISION     Forearm   INGUINAL HERNIA REPAIR Left    INSERTION OF MESH  05/25/2022   Procedure: INSERTION OF MESH;  Surgeon: Henrene Dodge, MD;  Location: [facility] ORS;  Service: General;;   KNEE SURGERY Bilateral    2 on R) 1 on L)   LEFT HEART CATH AND CORONARY ANGIOGRAPHY N/A 07/23/2017   Procedure: LEFT HEART CATH AND CORONARY ANGIOGRAPHY;  Surgeon: Antonieta Iba, MD;  Location: [facility] INVASIVE CV LAB;  Service: Cardiovascular;  Laterality: N/A;   LEFT HEART CATH AND CORONARY ANGIOGRAPHY Left 10/23/2020   Procedure: LEFT HEART CATH AND CORONARY ANGIOGRAPHY; Location: [facility]   POLYPECTOMY  11/06/2016   Procedure: POLYPECTOMY;  Surgeon: Midge Minium, MD;  Location: [facility];  Service: Endoscopy;;   SHOULDER ARTHROSCOPY WITH OPEN ROTATOR CUFF REPAIR Right 07/18/2018   Procedure: SHOULDER ARTHROSCOPY WITH OPEN ROTATOR CUFF REPAIR;  Surgeon: Christena Flake, MD;  Location: [facility] ORS;  Service: Orthopedics;  Laterality: Right;     reports that he has quit smoking. His smoking use included cigarettes. He has never used smokeless tobacco. He reports current alcohol use. He reports that he does not use drugs.  No Known Allergies  Family History  Problem Relation Age of Onset   CAD Father    Lung cancer Father    Heart failure Mother     Prior to Admission medications   Medication Sig Start Date End Date Taking? Authorizing Provider  acetaminophen (TYLENOL) 500 MG tablet Take 2 tablets (1,000 mg total) by mouth every 6 (six) hours as needed for mild pain. 05/25/22  Yes Piscoya, Elita Quick, MD  albuterol (VENTOLIN HFA) 108 (90 Base)  MCG/ACT inhaler Inhale 2 puffs into the lungs every 6 (six) hours as needed for wheezing or shortness of breath. 08/30/23  Yes Duanne Limerick, MD  aspirin EC 81 MG EC tablet Take 1 tablet (81 mg total) daily by mouth. 07/24/17  Yes Enid Baas, MD  atorvastatin (LIPITOR) 80 MG tablet TAKE 1 TABLET(80 MG) BY MOUTH DAILY AT 6 PM 03/05/23  Yes Gollan, Tollie Pizza, MD  carvedilol (COREG) 3.125 MG tablet Take 1 tablet (3.125 mg total) by mouth 2 (two) times daily. 04/03/23  Yes Gollan, Tollie Pizza, MD  citalopram (CELEXA) 20 MG tablet Take 20 mg by mouth at bedtime.   Yes [provider]  clopidogrel (PLAVIX) 75 MG tablet Take 75 mg once daily for 21 days and then discontinue.  Monitor for signs of bleeding or bruising. 09/26/23  Yes [provider]  donepezil (ARICEPT) 5 MG tablet Take 5 mg by mouth at bedtime.   Yes [provider]  EPINEPHrine 0.3 mg/0.3 mL IJ SOAJ injection Inject 0.3 mg into  the muscle as needed for anaphylaxis. 08/01/21  Yes Duanne Limerick, MD  fexofenadine (ALLEGRA) 180 MG tablet Take 180 mg by mouth as needed for allergies or rhinitis. otc   Yes [provider]  lamoTRIgine (LAMICTAL) 200 MG tablet Take 200 mg by mouth 2 (two) times daily.   Yes [provider]  losartan (COZAAR) 50 MG tablet Take 1 tablet (50 mg total) by mouth 2 (two) times daily. 03/05/23  Yes Gollan, Tollie Pizza, MD  nitroGLYCERIN (NITROSTAT) 0.4 MG SL tablet PLACE 1 TABLET UNDER THE TONGUE EVERY 5 MINUTES FOR 3 DOSES AS NEEDED FOR CHEST PAIN 05/22/23  Yes Gollan, Tollie Pizza, MD  spironolactone (ALDACTONE) 25 MG tablet Take 1 tablet (25 mg total) by mouth daily. 03/05/23  Yes Antonieta Iba, MD    Physical Exam: Vitals:   11/17/23 2111 11/17/23 2215  BP: (!) 155/88 (!) 153/74  Pulse: 67 61  Resp: 18 18  Temp: 98.1 F (36.7 C)   TempSrc: Oral   SpO2: 97% 98%    Physical Exam Vitals reviewed.  Constitutional:      General: He is not in acute distress. HENT:      Head: Normocephalic and atraumatic.  Eyes:     Extraocular Movements: Extraocular movements intact.  Cardiovascular:     Rate and Rhythm: Normal rate and regular rhythm.     Pulses: Normal pulses.  Pulmonary:     Effort: Pulmonary effort is normal. No respiratory distress.     Breath sounds: Normal breath sounds. No wheezing or rales.  Abdominal:     General: Bowel sounds are normal. There is no distension.     Palpations: Abdomen is soft.     Tenderness: There is no abdominal tenderness. There is no guarding or rebound.  Musculoskeletal:     Cervical back: Normal range of motion.     Comments: Left lower extremity in splint and neurovascularly intact.  Dorsalis pedis pulse palpable.  Skin:    General: Skin is warm and dry.  Neurological:     General: No focal deficit present.     Mental Status: He is alert and oriented to person, place, and time.     Labs on Admission: I have personally reviewed following labs and imaging studies  CBC: Recent Labs  Lab 11/17/23 2144  WBC 11.2*  HGB 12.8*  HCT 38.5*  MCV 93.2  PLT 210   Basic Metabolic Panel: Recent Labs  Lab 11/17/23 2144  NA 135  K 4.2  CL 102  CO2 26  GLUCOSE 104*  BUN 24*  CREATININE 1.30*  CALCIUM 9.4   GFR: CrCl cannot be calculated (Unknown ideal weight.). Liver Function Tests: No results for input(s): "AST", "ALT", "ALKPHOS", "BILITOT", "PROT", "ALBUMIN" in the last 168 hours. No results for input(s): "LIPASE", "AMYLASE" in the last 168 hours. No results for input(s): "AMMONIA" in the last 168 hours. Coagulation Profile: No results for input(s): "INR", "PROTIME" in the last 168 hours. Cardiac Enzymes: No results for input(s): "CKTOTAL", "CKMB", "CKMBINDEX", "TROPONINI" in the last 168 hours. BNP (last 3 results) No results for input(s): "PROBNP" in the last 8760 hours. HbA1C: No results for input(s): "HGBA1C" in the last 72 hours. CBG: No results for input(s): "GLUCAP" in the last 168  hours. Lipid Profile: No results for input(s): "CHOL", "HDL", "LDLCALC", "TRIG", "CHOLHDL", "LDLDIRECT" in the last 72 hours. Thyroid Function Tests: No results for input(s): "TSH", "T4TOTAL", "FREET4", "T3FREE", "THYROIDAB" in the last 72 hours. Anemia Panel: No results  for input(s): "VITAMINB12", "FOLATE", "FERRITIN", "TIBC", "IRON", "RETICCTPCT" in the last 72 hours. Urine analysis:    Component Value Date/Time   COLORURINE YELLOW 08/09/2021 1056   APPEARANCEUR CLEAR 08/09/2021 1056   LABSPEC 1.020 08/09/2021 1056   PHURINE 5.5 08/09/2021 1056   GLUCOSEU NEGATIVE 08/09/2021 1056   HGBUR TRACE (A) 08/09/2021 1056   BILIRUBINUR NEGATIVE 08/09/2021 1056   BILIRUBINUR negative 10/12/2016 1004   KETONESUR NEGATIVE 08/09/2021 1056   PROTEINUR NEGATIVE 08/09/2021 1056   UROBILINOGEN 0.2 10/12/2016 1004   NITRITE NEGATIVE 08/09/2021 1056   LEUKOCYTESUR NEGATIVE 08/09/2021 1056    Radiological Exams on Admission: CT Tibia Fibula Left Wo Contrast Result Date: 11/18/2023 CLINICAL DATA:  Lower leg trauma spiral fracture - CT imaging of left tibia/fibula and left ankle requested by orthopedic surgeon; Ankle trauma, fracture, xray done (Age >= 5y) EXAM: CT OF THE LOWER LEFT EXTREMITY AND ANKLE WITHOUT CONTRAST TECHNIQUE: Multidetector CT imaging of the lower left extremity and ankle was performed according to the standard protocol. RADIATION DOSE REDUCTION: This exam was performed according to the departmental dose-optimization program which includes automated exposure control, adjustment of the mA and/or kV according to patient size and/or use of iterative reconstruction technique. COMPARISON:  X-ray left tibia fibula 11/17/2023 FINDINGS: Bones/Joint/Cartilage Acute comminuted undisplaced mid to distal tibial shaft fracture. Acute full shaft with displaced diagonal proximal fibular shaft fracture. No acute fracture of the bones of the ankle. No acute displaced fracture or dislocation of the  visualized bones of the knee. No severe degenerative changes. Ligaments Suboptimally assessed by CT. Muscles and Tendons Grossly unremarkable. Soft tissues Subcutaneus soft tissue edema.  Vascular calcification. IMPRESSION: 1. Acute comminuted undisplaced mid to distal tibial shaft fracture. 2. Acute full shaft with displaced diagonal proximal fibular shaft fracture. 3. No acute fracture of the bones of the ankle. Electronically Signed   By: Tish Frederickson M.D.   On: 11/18/2023 00:01   CT Ankle Left Wo Contrast Result Date: 11/18/2023 CLINICAL DATA:  Lower leg trauma spiral fracture - CT imaging of left tibia/fibula and left ankle requested by orthopedic surgeon; Ankle trauma, fracture, xray done (Age >= 5y) EXAM: CT OF THE LOWER LEFT EXTREMITY AND ANKLE WITHOUT CONTRAST TECHNIQUE: Multidetector CT imaging of the lower left extremity and ankle was performed according to the standard protocol. RADIATION DOSE REDUCTION: This exam was performed according to the departmental dose-optimization program which includes automated exposure control, adjustment of the mA and/or kV according to patient size and/or use of iterative reconstruction technique. COMPARISON:  X-ray left tibia fibula 11/17/2023 FINDINGS: Bones/Joint/Cartilage Acute comminuted undisplaced mid to distal tibial shaft fracture. Acute full shaft with displaced diagonal proximal fibular shaft fracture. No acute fracture of the bones of the ankle. No acute displaced fracture or dislocation of the visualized bones of the knee. No severe degenerative changes. Ligaments Suboptimally assessed by CT. Muscles and Tendons Grossly unremarkable. Soft tissues Subcutaneus soft tissue edema.  Vascular calcification. IMPRESSION: 1. Acute comminuted undisplaced mid to distal tibial shaft fracture. 2. Acute full shaft with displaced diagonal proximal fibular shaft fracture. 3. No acute fracture of the bones of the ankle. Electronically Signed   By: Tish Frederickson M.D.    On: 11/18/2023 00:01   DG Tibia/Fibula Left Result Date: 11/17/2023 CLINICAL DATA:  Deformity after falling down steps.  Flank pain. EXAM: LEFT TIBIA AND FIBULA - 2 VIEW COMPARISON:  None Available. FINDINGS: Acute comminuted spiral fracture of the mid-distal left tibial diaphysis. Posterior displacement of the distal fragment 1/2 shaft  width. Slight apex anterior angulation. Additional displaced oblique fracture of the proximal fibular diaphysis. Slight posterior displacement of the distal fragment with apex lateral angulation. IMPRESSION: 1. Acute comminuted spiral fracture of the mid-distal left tibial diaphysis. 2. Acute displaced oblique fracture of the proximal fibular diaphysis. Electronically Signed   By: Minerva Fester M.D.   On: 11/17/2023 21:58    Assessment and Plan  Acute comminuted undisplaced mid to distal tibial shaft fracture  Acute full shaft with displaced diagonal proximal fibular shaft fracture Secondary to a mechanical fall.  Patient takes aspirin at home but not on anticoagulation.  Orthopedics consulted and leg was placed in a splint.  Neurovascularly intact.  Keep n.p.o. for now until patient is evaluated by orthopedics.  Gentle IV fluid hydration, pain management, nonweightbearing.  Hold aspirin.  CAD Patient is not endorsing any anginal symptoms.  EKG ordered for preop evaluation.  Hold aspirin at this time.  Continue Coreg and Lipitor.  History of CVA PAD Hold aspirin at this time.  Continue Lipitor.  Hypertension Continue Coreg.  Hyperlipidemia Continue Lipitor.  Seizure disorder Continue lamotrigine.  Mood disorder Continue citalopram.  DVT prophylaxis: SCDs Code Status: DNR (discussed with the patient and his wife) Family Communication: Wife at bedside. Consults called: Orthopedics Level of care: Telemetry bed Admission status: It is my clinical opinion that admission to INPATIENT is reasonable and necessary because of the expectation that this  patient will require hospital care that crosses at least 2 midnights to treat this condition based on the medical complexity of the problems presented.  Given the aforementioned information, the predictability of an adverse outcome is felt to be significant.  John Giovanni MD [facility]  If 7PM-7AM, please contact night-coverage [email or web address]  11/18/2023, 12:06 AM

## 2023-11-18 NOTE — Progress Notes (Signed)
 PROGRESS NOTE    George Doyle  ZHY:[id number] DOB: 05-26-1951 DOA: 11/17/2023 PCP: Duanne Limerick, MD    Brief Narrative:  73 year old with history of HTN, CVA, PAD, CAD, HLD, SDH in 2019.  Patient fell at [employer] last night when he tripped over stairs sustaining left-sided tibial fracture, long-leg splint was placed.  Orthopedic requested transferring patient to [facility] for surgical fixation by trauma team.   Assessment & Plan:  Principal Problem:   Tibia/fibula fracture Active Problems:   Essential hypertension   Hyperlipidemia   CAD (coronary artery disease), native coronary artery   Seizure disorder (HCC)   PAD (peripheral artery disease) (HCC)   Hip fracture (HCC)    Acute comminuted undisplaced mid to distal tibial shaft fracture  Acute full shaft with displaced diagonal proximal fibular shaft fracture Mechanical fall.  Transfer patient to [facility] for further surgical intervention.  Management per their service.   CAD status post PCI No anginal symptoms.  Overnight sinus bradycardia when sleeping.  No further workup necessary at this time.   History of CVA, January 2025 PAD Hold aspirin at this time.  Continue Lipitor.   Hypertension IV as needed   Hyperlipidemia Continue Lipitor.   Seizure disorder Continue lamotrigine.   Mood disorder Continue citalopram.   DVT prophylaxis: SCDs Start: 11/18/23 0059     Code Status: Do not attempt resuscitation (DNR) PRE-ARREST INTERVENTIONS DESIRED Family Communication:   Status is: Inpatient Remains inpatient appropriate because: Transfer to [facility] for ortho surgical eval.     Subjective: Doing ok, no complaints besides leg pain   Examination:  General exam: Appears calm and comfortable  Respiratory system: Clear to auscultation. Respiratory effort normal. Cardiovascular system: S1 & S2 heard, RRR. No JVD, murmurs, rubs, gallops or clicks. No pedal edema. Gastrointestinal system:  Abdomen is nondistended, soft and nontender. No organomegaly or masses felt. Normal bowel sounds heard. Central nervous system: Alert and oriented. No focal neurological deficits. Extremities: Symmetric 5 x 5 power. Skin: No rashes, lesions or ulcers. LLE in cast.  Psychiatry: Judgement and insight appear normal. Mood & affect appropriate.                Diet Orders (From admission, onward)     Start     Ordered   11/18/23 0100  Diet NPO time specified  Diet effective now        11/18/23 0100            Objective: Vitals:   11/18/23 0223 11/18/23 0230 11/18/23 0500 11/18/23 0621  BP:  134/77 133/76   Pulse:  (!) 58 (!) 58   Resp:  12 14   Temp: 97.7 F (36.5 C)   97.7 F (36.5 C)  TempSrc: Oral   Oral  SpO2:  94% 94%    No intake or output data in the 24 hours ending 11/18/23 0926 There were no vitals filed for this visit.  Scheduled Meds:  atorvastatin  80 mg Oral Daily   citalopram  20 mg Oral QHS   donepezil  5 mg Oral QHS   lamoTRIgine  200 mg Oral BID   Continuous Infusions:  sodium chloride 75 mL/hr at 11/18/23 0511    Nutritional status     There is no height or weight on file to calculate BMI.  Data Reviewed:   CBC: Recent Labs  Lab 11/17/23 2144 11/18/23 0511  WBC 11.2* 9.4  HGB 12.8* 12.0*  HCT 38.5* 37.0*  MCV 93.2 94.4  PLT 210 192   Basic Metabolic Panel: Recent Labs  Lab 11/17/23 2144 11/18/23 0511  NA 135 134*  K 4.2 4.0  CL 102 104  CO2 26 23  GLUCOSE 104* 96  BUN 24* 24*  CREATININE 1.30* 1.06  CALCIUM 9.4 8.7*   GFR: CrCl cannot be calculated (Unknown ideal weight.). Liver Function Tests: Recent Labs  Lab 11/18/23 0511  AST 19  ALT 13  ALKPHOS 62  BILITOT 0.8  PROT 6.0*  ALBUMIN 3.5   No results for input(s): "LIPASE", "AMYLASE" in the last 168 hours. No results for input(s): "AMMONIA" in the last 168 hours. Coagulation Profile: No results for input(s): "INR", "PROTIME" in the last 168  hours. Cardiac Enzymes: No results for input(s): "CKTOTAL", "CKMB", "CKMBINDEX", "TROPONINI" in the last 168 hours. BNP (last 3 results) No results for input(s): "PROBNP" in the last 8760 hours. HbA1C: No results for input(s): "HGBA1C" in the last 72 hours. CBG: No results for input(s): "GLUCAP" in the last 168 hours. Lipid Profile: No results for input(s): "CHOL", "HDL", "LDLCALC", "TRIG", "CHOLHDL", "LDLDIRECT" in the last 72 hours. Thyroid Function Tests: No results for input(s): "TSH", "T4TOTAL", "FREET4", "T3FREE", "THYROIDAB" in the last 72 hours. Anemia Panel: No results for input(s): "VITAMINB12", "FOLATE", "FERRITIN", "TIBC", "IRON", "RETICCTPCT" in the last 72 hours. Sepsis Labs: No results for input(s): "PROCALCITON", "LATICACIDVEN" in the last 168 hours.  No results found for this or any previous visit (from the past 240 hours).       Radiology Studies: CT Tibia Fibula Left Wo Contrast Result Date: 11/18/2023 CLINICAL DATA:  Lower leg trauma spiral fracture - CT imaging of left tibia/fibula and left ankle requested by orthopedic surgeon; Ankle trauma, fracture, xray done (Age >= 5y) EXAM: CT OF THE LOWER LEFT EXTREMITY AND ANKLE WITHOUT CONTRAST TECHNIQUE: Multidetector CT imaging of the lower left extremity and ankle was performed according to the standard protocol. RADIATION DOSE REDUCTION: This exam was performed according to the departmental dose-optimization program which includes automated exposure control, adjustment of the mA and/or kV according to patient size and/or use of iterative reconstruction technique. COMPARISON:  X-ray left tibia fibula 11/17/2023 FINDINGS: Bones/Joint/Cartilage Acute comminuted undisplaced mid to distal tibial shaft fracture. Acute full shaft with displaced diagonal proximal fibular shaft fracture. No acute fracture of the bones of the ankle. No acute displaced fracture or dislocation of the visualized bones of the knee. No severe degenerative  changes. Ligaments Suboptimally assessed by CT. Muscles and Tendons Grossly unremarkable. Soft tissues Subcutaneus soft tissue edema.  Vascular calcification. IMPRESSION: 1. Acute comminuted undisplaced mid to distal tibial shaft fracture. 2. Acute full shaft with displaced diagonal proximal fibular shaft fracture. 3. No acute fracture of the bones of the ankle. Electronically Signed   By: Tish Frederickson M.D.   On: 11/18/2023 00:01   CT Ankle Left Wo Contrast Result Date: 11/18/2023 CLINICAL DATA:  Lower leg trauma spiral fracture - CT imaging of left tibia/fibula and left ankle requested by orthopedic surgeon; Ankle trauma, fracture, xray done (Age >= 5y) EXAM: CT OF THE LOWER LEFT EXTREMITY AND ANKLE WITHOUT CONTRAST TECHNIQUE: Multidetector CT imaging of the lower left extremity and ankle was performed according to the standard protocol. RADIATION DOSE REDUCTION: This exam was performed according to the departmental dose-optimization program which includes automated exposure control, adjustment of the mA and/or kV according to patient size and/or use of iterative reconstruction technique. COMPARISON:  X-ray left tibia fibula 11/17/2023 FINDINGS: Bones/Joint/Cartilage Acute comminuted undisplaced mid to distal tibial shaft  fracture. Acute full shaft with displaced diagonal proximal fibular shaft fracture. No acute fracture of the bones of the ankle. No acute displaced fracture or dislocation of the visualized bones of the knee. No severe degenerative changes. Ligaments Suboptimally assessed by CT. Muscles and Tendons Grossly unremarkable. Soft tissues Subcutaneus soft tissue edema.  Vascular calcification. IMPRESSION: 1. Acute comminuted undisplaced mid to distal tibial shaft fracture. 2. Acute full shaft with displaced diagonal proximal fibular shaft fracture. 3. No acute fracture of the bones of the ankle. Electronically Signed   By: Tish Frederickson M.D.   On: 11/18/2023 00:01   DG Tibia/Fibula Left Result  Date: 11/17/2023 CLINICAL DATA:  Deformity after falling down steps.  Flank pain. EXAM: LEFT TIBIA AND FIBULA - 2 VIEW COMPARISON:  None Available. FINDINGS: Acute comminuted spiral fracture of the mid-distal left tibial diaphysis. Posterior displacement of the distal fragment 1/2 shaft width. Slight apex anterior angulation. Additional displaced oblique fracture of the proximal fibular diaphysis. Slight posterior displacement of the distal fragment with apex lateral angulation. IMPRESSION: 1. Acute comminuted spiral fracture of the mid-distal left tibial diaphysis. 2. Acute displaced oblique fracture of the proximal fibular diaphysis. Electronically Signed   By: Minerva Fester M.D.   On: 11/17/2023 21:58           LOS: 1 day   Time spent= 35 mins    Miguel Rota, MD [facility]  If 7PM-7AM, please contact night-coverage  11/18/2023, 9:26 AM

## 2023-11-18 NOTE — ED Notes (Signed)
 [facility] called for transport.

## 2023-11-19 ENCOUNTER — Inpatient Hospital Stay (HOSPITAL_COMMUNITY)

## 2023-11-19 ENCOUNTER — Encounter (HOSPITAL_COMMUNITY): Payer: Self-pay | Admitting: Internal Medicine

## 2023-11-19 ENCOUNTER — Inpatient Hospital Stay (HOSPITAL_COMMUNITY): Payer: Self-pay | Admitting: Certified Registered"

## 2023-11-19 ENCOUNTER — Other Ambulatory Visit: Payer: Self-pay

## 2023-11-19 ENCOUNTER — Encounter (HOSPITAL_COMMUNITY)
Admission: EM | Disposition: A | Discharge status: Home Health | Payer: Self-pay | Source: Home / Self Care | Attending: Internal Medicine

## 2023-11-19 DIAGNOSIS — S82242A Displaced spiral fracture of shaft of left tibia, initial encounter for closed fracture: Principal | ICD-10-CM

## 2023-11-19 DIAGNOSIS — S82202A Unspecified fracture of shaft of left tibia, initial encounter for closed fracture: Secondary | ICD-10-CM | POA: Diagnosis not present

## 2023-11-19 DIAGNOSIS — I2510 Atherosclerotic heart disease of native coronary artery without angina pectoris: Secondary | ICD-10-CM | POA: Diagnosis not present

## 2023-11-19 DIAGNOSIS — I639 Cerebral infarction, unspecified: Secondary | ICD-10-CM | POA: Diagnosis not present

## 2023-11-19 DIAGNOSIS — S82832A Other fracture of upper and lower end of left fibula, initial encounter for closed fracture: Secondary | ICD-10-CM

## 2023-11-19 DIAGNOSIS — S82402A Unspecified fracture of shaft of left fibula, initial encounter for closed fracture: Secondary | ICD-10-CM | POA: Diagnosis not present

## 2023-11-19 DIAGNOSIS — I10 Essential (primary) hypertension: Secondary | ICD-10-CM | POA: Diagnosis not present

## 2023-11-19 HISTORY — PX: TIBIA IM NAIL INSERTION: SHX2516

## 2023-11-19 LAB — VITAMIN D 25 HYDROXY (VIT D DEFICIENCY, FRACTURES): Vit D, 25-Hydroxy: 31.19 ng/mL (ref 30–100)

## 2023-11-19 LAB — SURGICAL PCR SCREEN
MRSA, PCR: NEGATIVE
Staphylococcus aureus: NEGATIVE

## 2023-11-19 SURGERY — INSERTION, INTRAMEDULLARY ROD, TIBIA
Anesthesia: General | Laterality: Left

## 2023-11-19 MED ORDER — DOCUSATE SODIUM 100 MG PO CAPS
100.0000 mg | ORAL_CAPSULE | Freq: Two times a day (BID) | ORAL | Status: DC
  Administered 2023-11-19 – 2023-11-20 (×4): 100 mg via ORAL
  Filled 2023-11-19 (×5): qty 1

## 2023-11-19 MED ORDER — FENTANYL CITRATE (PF) 250 MCG/5ML IJ SOLN
INTRAMUSCULAR | Status: DC | PRN
  Administered 2023-11-19: 50 ug via INTRAVENOUS
  Administered 2023-11-19: 100 ug via INTRAVENOUS
  Administered 2023-11-19: 50 ug via INTRAVENOUS

## 2023-11-19 MED ORDER — POLYETHYLENE GLYCOL 3350 17 G PO PACK
17.0000 g | PACK | Freq: Two times a day (BID) | ORAL | Status: AC
  Administered 2023-11-19 – 2023-11-20 (×4): 17 g via ORAL
  Filled 2023-11-19 (×4): qty 1

## 2023-11-19 MED ORDER — FENTANYL CITRATE (PF) 100 MCG/2ML IJ SOLN
INTRAMUSCULAR | Status: AC
  Filled 2023-11-19: qty 2

## 2023-11-19 MED ORDER — 0.9 % SODIUM CHLORIDE (POUR BTL) OPTIME
TOPICAL | Status: DC | PRN
  Administered 2023-11-19: 1000 mL

## 2023-11-19 MED ORDER — VANCOMYCIN HCL 1000 MG IV SOLR
INTRAVENOUS | Status: DC | PRN
  Administered 2023-11-19: 1000 mg via TOPICAL

## 2023-11-19 MED ORDER — SUGAMMADEX SODIUM 200 MG/2ML IV SOLN
INTRAVENOUS | Status: AC
  Filled 2023-11-19: qty 2

## 2023-11-19 MED ORDER — FENTANYL CITRATE (PF) 250 MCG/5ML IJ SOLN
INTRAMUSCULAR | Status: AC
Start: 2023-11-19 — End: ?
  Filled 2023-11-19: qty 5

## 2023-11-19 MED ORDER — ONDANSETRON HCL 4 MG PO TABS
4.0000 mg | ORAL_TABLET | Freq: Four times a day (QID) | ORAL | Status: DC | PRN
Start: 2023-11-19 — End: 2023-11-21

## 2023-11-19 MED ORDER — CEFAZOLIN SODIUM-DEXTROSE 2-3 GM-%(50ML) IV SOLR
INTRAVENOUS | Status: DC | PRN
  Administered 2023-11-19: 2 g via INTRAVENOUS

## 2023-11-19 MED ORDER — AMISULPRIDE (ANTIEMETIC) 5 MG/2ML IV SOLN: 10.0000 mg | Freq: Once | INTRAVENOUS | Status: DC | PRN

## 2023-11-19 MED ORDER — PROPOFOL 10 MG/ML IV BOLUS
INTRAVENOUS | Status: DC | PRN
  Administered 2023-11-19: 150 mg via INTRAVENOUS

## 2023-11-19 MED ORDER — METHOCARBAMOL 1000 MG/10ML IJ SOLN: 500.0000 mg | Freq: Three times a day (TID) | INTRAMUSCULAR | Status: DC | PRN

## 2023-11-19 MED ORDER — METOCLOPRAMIDE HCL 5 MG PO TABS: 5.0000 mg | ORAL_TABLET | Freq: Three times a day (TID) | ORAL | Status: DC | PRN

## 2023-11-19 MED ORDER — METOCLOPRAMIDE HCL 5 MG/ML IJ SOLN: 5.0000 mg | Freq: Three times a day (TID) | INTRAMUSCULAR | Status: DC | PRN

## 2023-11-19 MED ORDER — ORAL CARE MOUTH RINSE: 15.0000 mL | Freq: Once | OROMUCOSAL | Status: AC

## 2023-11-19 MED ORDER — ACETAMINOPHEN 325 MG PO TABS
650.0000 mg | ORAL_TABLET | Freq: Four times a day (QID) | ORAL | Status: DC
  Administered 2023-11-19 – 2023-11-21 (×9): 650 mg via ORAL
  Filled 2023-11-19 (×9): qty 2

## 2023-11-19 MED ORDER — SUGAMMADEX SODIUM 200 MG/2ML IV SOLN
INTRAVENOUS | Status: DC | PRN
Start: 2023-11-19 — End: 2023-11-19
  Administered 2023-11-19: 200 mg via INTRAVENOUS

## 2023-11-19 MED ORDER — FENTANYL CITRATE (PF) 100 MCG/2ML IJ SOLN
25.0000 ug | INTRAMUSCULAR | Status: DC | PRN
  Administered 2023-11-19 (×3): 25 ug via INTRAVENOUS

## 2023-11-19 MED ORDER — LOSARTAN POTASSIUM 50 MG PO TABS
50.0000 mg | ORAL_TABLET | Freq: Two times a day (BID) | ORAL | Status: DC
  Administered 2023-11-19 – 2023-11-21 (×5): 50 mg via ORAL
  Filled 2023-11-19 (×5): qty 1

## 2023-11-19 MED ORDER — CEFAZOLIN SODIUM 1 G IJ SOLR
INTRAMUSCULAR | Status: AC
  Filled 2023-11-19: qty 20

## 2023-11-19 MED ORDER — CHLORHEXIDINE GLUCONATE 0.12 % MT SOLN
OROMUCOSAL | Status: AC
  Administered 2023-11-19: 15 mL via OROMUCOSAL
  Filled 2023-11-19: qty 15

## 2023-11-19 MED ORDER — OXYCODONE HCL 5 MG PO TABS
5.0000 mg | ORAL_TABLET | ORAL | Status: DC | PRN
  Administered 2023-11-19 – 2023-11-20 (×6): 5 mg via ORAL
  Filled 2023-11-19 (×6): qty 1

## 2023-11-19 MED ORDER — PHENYLEPHRINE 80 MCG/ML (10ML) SYRINGE FOR IV PUSH (FOR BLOOD PRESSURE SUPPORT)
PREFILLED_SYRINGE | INTRAVENOUS | Status: DC | PRN
  Administered 2023-11-19 (×3): 100 ug via INTRAVENOUS

## 2023-11-19 MED ORDER — VANCOMYCIN HCL 1000 MG IV SOLR
INTRAVENOUS | Status: AC
  Filled 2023-11-19: qty 20

## 2023-11-19 MED ORDER — ONDANSETRON HCL 4 MG/2ML IJ SOLN
INTRAMUSCULAR | Status: AC
  Filled 2023-11-19: qty 2

## 2023-11-19 MED ORDER — ASPIRIN 81 MG PO TBEC
81.0000 mg | DELAYED_RELEASE_TABLET | Freq: Every day | ORAL | Status: DC
  Administered 2023-11-20 – 2023-11-21 (×2): 81 mg via ORAL
  Filled 2023-11-19 (×3): qty 1

## 2023-11-19 MED ORDER — LABETALOL HCL 5 MG/ML IV SOLN
INTRAVENOUS | Status: AC
  Filled 2023-11-19: qty 4

## 2023-11-19 MED ORDER — CEFAZOLIN SODIUM-DEXTROSE 2-4 GM/100ML-% IV SOLN
2.0000 g | Freq: Three times a day (TID) | INTRAVENOUS | Status: AC
  Administered 2023-11-19 – 2023-11-20 (×3): 2 g via INTRAVENOUS
  Filled 2023-11-19 (×3): qty 100

## 2023-11-19 MED ORDER — ROCURONIUM BROMIDE 10 MG/ML (PF) SYRINGE
PREFILLED_SYRINGE | INTRAVENOUS | Status: DC | PRN
  Administered 2023-11-19: 60 mg via INTRAVENOUS

## 2023-11-19 MED ORDER — HYDROMORPHONE HCL 1 MG/ML IJ SOLN
0.5000 mg | INTRAMUSCULAR | Status: DC | PRN
  Administered 2023-11-19 – 2023-11-20 (×4): 1 mg via INTRAVENOUS
  Filled 2023-11-19 (×4): qty 1

## 2023-11-19 MED ORDER — ACETAMINOPHEN 650 MG RE SUPP: 650.0000 mg | Freq: Four times a day (QID) | RECTAL | Status: DC

## 2023-11-19 MED ORDER — METHOCARBAMOL 500 MG PO TABS
500.0000 mg | ORAL_TABLET | Freq: Three times a day (TID) | ORAL | Status: DC | PRN
  Administered 2023-11-19 – 2023-11-21 (×5): 500 mg via ORAL
  Filled 2023-11-19 (×5): qty 1

## 2023-11-19 MED ORDER — PROPOFOL 10 MG/ML IV BOLUS
INTRAVENOUS | Status: AC
  Filled 2023-11-19: qty 20

## 2023-11-19 MED ORDER — LABETALOL HCL 5 MG/ML IV SOLN
5.0000 mg | Freq: Once | INTRAVENOUS | Status: AC
  Administered 2023-11-19: 5 mg via INTRAVENOUS

## 2023-11-19 MED ORDER — PHENYLEPHRINE HCL-NACL 20-0.9 MG/250ML-% IV SOLN
INTRAVENOUS | Status: DC | PRN
  Administered 2023-11-19: 30 ug/min via INTRAVENOUS

## 2023-11-19 MED ORDER — POTASSIUM CHLORIDE IN NACL 20-0.9 MEQ/L-% IV SOLN
INTRAVENOUS | Status: DC
  Filled 2023-11-19 (×2): qty 1000

## 2023-11-19 MED ORDER — LIDOCAINE 2% (20 MG/ML) 5 ML SYRINGE
INTRAMUSCULAR | Status: AC
  Filled 2023-11-19: qty 5

## 2023-11-19 MED ORDER — CHLORHEXIDINE GLUCONATE 0.12 % MT SOLN: 15.0000 mL | Freq: Once | OROMUCOSAL | Status: AC

## 2023-11-19 MED ORDER — ROCURONIUM BROMIDE 10 MG/ML (PF) SYRINGE
PREFILLED_SYRINGE | INTRAVENOUS | Status: AC
  Filled 2023-11-19: qty 10

## 2023-11-19 MED ORDER — ONDANSETRON HCL 4 MG/2ML IJ SOLN
INTRAMUSCULAR | Status: DC | PRN
  Administered 2023-11-19: 4 mg via INTRAVENOUS

## 2023-11-19 MED ORDER — POLYETHYLENE GLYCOL 3350 17 G PO PACK: 17.0000 g | PACK | Freq: Every day | ORAL | Status: DC | PRN

## 2023-11-19 MED ORDER — LIDOCAINE 2% (20 MG/ML) 5 ML SYRINGE
INTRAMUSCULAR | Status: DC | PRN
  Administered 2023-11-19: 60 mg via INTRAVENOUS

## 2023-11-19 MED ORDER — ONDANSETRON HCL 4 MG/2ML IJ SOLN
4.0000 mg | Freq: Four times a day (QID) | INTRAMUSCULAR | Status: DC | PRN
Start: 2023-11-19 — End: 2023-11-21

## 2023-11-19 MED ORDER — LACTATED RINGERS IV SOLN: INTRAVENOUS | Status: DC

## 2023-11-19 MED ORDER — CARVEDILOL 3.125 MG PO TABS
3.1250 mg | ORAL_TABLET | Freq: Two times a day (BID) | ORAL | Status: DC
  Administered 2023-11-19 – 2023-11-21 (×5): 3.125 mg via ORAL
  Filled 2023-11-19 (×5): qty 1

## 2023-11-19 SURGICAL SUPPLY — 49 items
BAG COUNTER SPONGE SURGICOUNT (BAG) ×1 IMPLANT
BLADE SURG 10 STRL SS (BLADE) ×2 IMPLANT
BNDG COHESIVE 4X5 TAN STRL LF (GAUZE/BANDAGES/DRESSINGS) ×1 IMPLANT
BNDG ELASTIC 4INX 5YD STR LF (GAUZE/BANDAGES/DRESSINGS) IMPLANT
BNDG ELASTIC 4X5.8 VLCR STR LF (GAUZE/BANDAGES/DRESSINGS) ×1 IMPLANT
BNDG ELASTIC 6INX 5YD STR LF (GAUZE/BANDAGES/DRESSINGS) ×1 IMPLANT
BNDG GAUZE DERMACEA FLUFF 4 (GAUZE/BANDAGES/DRESSINGS) ×1 IMPLANT
BRUSH SCRUB EZ PLAIN DRY (MISCELLANEOUS) ×2 IMPLANT
CHLORAPREP W/TINT 26 (MISCELLANEOUS) ×1 IMPLANT
CLSR STERI-STRIP ANTIMIC 1/2X4 (GAUZE/BANDAGES/DRESSINGS) IMPLANT
COVER SURGICAL LIGHT HANDLE (MISCELLANEOUS) ×2 IMPLANT
DRAPE C-ARM 42X72 X-RAY (DRAPES) ×1 IMPLANT
DRAPE C-ARMOR (DRAPES) ×1 IMPLANT
DRAPE HALF SHEET 40X57 (DRAPES) ×2 IMPLANT
DRAPE IMP U-DRAPE 54X76 (DRAPES) ×2 IMPLANT
DRAPE INCISE IOBAN 66X45 STRL (DRAPES) IMPLANT
DRAPE SURG ORHT 6 SPLT 77X108 (DRAPES) ×2 IMPLANT
DRAPE U-SHAPE 47X51 STRL (DRAPES) ×1 IMPLANT
DRILL DISTAL 4.3 INTERLOCK (DRILL) IMPLANT
DRILL SHORT 4.3 INTERLOCK (DRILL) IMPLANT
DRSG ADAPTIC 3X8 NADH LF (GAUZE/BANDAGES/DRESSINGS) ×1 IMPLANT
ELECT REM PT RETURN 9FT ADLT (ELECTROSURGICAL) ×1 IMPLANT
ELECTRODE REM PT RTRN 9FT ADLT (ELECTROSURGICAL) ×1 IMPLANT
GAUZE SPONGE 4X4 12PLY STRL (GAUZE/BANDAGES/DRESSINGS) ×1 IMPLANT
GLOVE BIO SURGEON STRL SZ 6.5 (GLOVE) ×3 IMPLANT
GLOVE BIO SURGEON STRL SZ7.5 (GLOVE) ×4 IMPLANT
GLOVE BIOGEL PI IND STRL 6.5 (GLOVE) ×1 IMPLANT
GLOVE BIOGEL PI IND STRL 7.5 (GLOVE) ×1 IMPLANT
GOWN STRL REUS W/ TWL LRG LVL3 (GOWN DISPOSABLE) ×2 IMPLANT
GUIDEPIN 3.2X17.5 THRD DISP (PIN) IMPLANT
GUIDEWIRE BEAD TIP 100 ST (WIRE) IMPLANT
KIT BASIN OR (CUSTOM PROCEDURE TRAY) ×1 IMPLANT
KIT TURNOVER KIT B (KITS) ×1 IMPLANT
NAIL TIB AFFIX ST 10X350 (Nail) IMPLANT
PACK TOTAL JOINT (CUSTOM PROCEDURE TRAY) ×1 IMPLANT
PAD ARMBOARD 7.5X6 YLW CONV (MISCELLANEOUS) ×2 IMPLANT
PADDING CAST ABS COTTON 4X4 ST (CAST SUPPLIES) IMPLANT
PADDING CAST ABS COTTON 6X4 NS (CAST SUPPLIES) IMPLANT
SCREW CORT AFFIX ST 5X60 (Screw) IMPLANT
SCREW CORT ST AFFIX 5X32 (Screw) IMPLANT
SCREW CORTICAL BONE 5X36 ST (Screw) IMPLANT
SCREW CORTICAL BONE 5X42 ST (Screw) IMPLANT
STAPLER VISISTAT 35W (STAPLE) ×1 IMPLANT
SUT MNCRL AB 3-0 PS2 18 (SUTURE) ×1 IMPLANT
SUT VIC AB 0 CT1 27XBRD ANBCTR (SUTURE) IMPLANT
SUT VIC AB 2-0 CT1 TAPERPNT 27 (SUTURE) IMPLANT
TOWEL GREEN STERILE (TOWEL DISPOSABLE) ×2 IMPLANT
TOWEL GREEN STERILE FF (TOWEL DISPOSABLE) ×1 IMPLANT
YANKAUER SUCT BULB TIP NO VENT (SUCTIONS) IMPLANT

## 2023-11-19 NOTE — Progress Notes (Signed)
 [facility] PROGRESS NOTE    Progress Note  George Doyle  ZOX:[id number] DOB: 01-09-1951 DOA: 11/17/2023 PCP: Duanne Limerick, MD     Brief Narrative:   George Doyle is an 73 y.o. male past medical history of essential hypertension, PAD CAD subdural hematoma in 2019 who fell at the [facility] the day prior to admission CT of the tibia and fibula showed acute comminuted undisplaced distal shaft fracture of the tibia and acute shaft fibular fracture    Assessment/Plan:   Acute comminuted undisplaced mid tibial shaft tibia/fibula fracture: Orthopedic surgery was consulted recommended intramedullary nailing of the left tibia done on /11/2023. Continue narcotics for pain started on MiraLAX p.o. twice daily Continue narcotics for analgesics. PT OT has been consulted, weightbearing and anticoagulation per orthopedic surgery.  CAD status post PCI: No anginal symptoms overnight.  History of CVA: Hold aspirin continue Lipitor. Has a Zio patch in place.  Essential hypertension: Resume antihypertensive medication postsurgical procedure   Seizure disorder (HCC) He denies any evidence of seizures continue Lamictal.  Mood disorder: Continue citalopram.   DVT prophylaxis: lovenox Family Communication:none Status is: Inpatient Remains inpatient appropriate because: Acute comminuted nondisplaced mid tibial shaft fracture    Code Status:     Code Status Orders  (From admission, onward)           Start     Ordered   11/18/23 0100  Do not attempt resuscitation (DNR) Pre-Arrest Interventions Desired  Continuous       Question Answer Comment  If pulseless and not breathing No CPR or chest compressions.   In Pre-Arrest Conditions (Patient Has Pulse and Is Breathing) May intubate, use advanced airway interventions and cardioversion/ACLS medications if appropriate or indicated. May transfer to ICU.   Consent: Discussion documented in EHR or advanced  directives reviewed      11/18/23 0100           Code Status History     Date Active Date Inactive Code Status Order ID Comments User Context   11/18/2023 0010 11/18/2023 0100 Do not attempt resuscitation (DNR) PRE-ARREST INTERVENTIONS DESIRED [id number]  John Giovanni, MD ED   07/18/2018 1234 07/18/2018 1644 Full Code [id number]  Christena Flake, MD Inpatient   07/20/2017 1937 07/24/2017 1424 Full Code [id number]  Delfino Lovett, MD Inpatient      Advance Directive Documentation    Flowsheet Row Most Recent Value  Type of Advance Directive Healthcare Power of Attorney, Living will  Pre-existing out of facility DNR order (yellow form or pink MOST form) --  "MOST" Form in Place? --         IV Access:   Peripheral IV   Procedures and diagnostic studies:   DG C-Arm 1-60 Min-No Report Result Date: 11/19/2023 Fluoroscopy was utilized by the requesting physician.  No radiographic interpretation.   CT Tibia Fibula Left Wo Contrast Result Date: 11/18/2023 CLINICAL DATA:  Lower leg trauma spiral fracture - CT imaging of left tibia/fibula and left ankle requested by orthopedic surgeon; Ankle trauma, fracture, xray done (Age >= 5y) EXAM: CT OF THE LOWER LEFT EXTREMITY AND ANKLE WITHOUT CONTRAST TECHNIQUE: Multidetector CT imaging of the lower left extremity and ankle was performed according to the standard protocol. RADIATION DOSE REDUCTION: This exam was performed according to the departmental dose-optimization program which includes automated exposure control, adjustment of the mA and/or kV according to patient size and/or use of iterative reconstruction technique. COMPARISON:  X-ray left tibia fibula 11/17/2023 FINDINGS: Bones/Joint/Cartilage Acute comminuted undisplaced  mid to distal tibial shaft fracture. Acute full shaft with displaced diagonal proximal fibular shaft fracture. No acute fracture of the bones of the ankle. No acute displaced fracture or dislocation of the visualized bones of  the knee. No severe degenerative changes. Ligaments Suboptimally assessed by CT. Muscles and Tendons Grossly unremarkable. Soft tissues Subcutaneus soft tissue edema.  Vascular calcification. IMPRESSION: 1. Acute comminuted undisplaced mid to distal tibial shaft fracture. 2. Acute full shaft with displaced diagonal proximal fibular shaft fracture. 3. No acute fracture of the bones of the ankle. Electronically Signed   By: Tish Frederickson M.D.   On: 11/18/2023 00:01   CT Ankle Left Wo Contrast Result Date: 11/18/2023 CLINICAL DATA:  Lower leg trauma spiral fracture - CT imaging of left tibia/fibula and left ankle requested by orthopedic surgeon; Ankle trauma, fracture, xray done (Age >= 5y) EXAM: CT OF THE LOWER LEFT EXTREMITY AND ANKLE WITHOUT CONTRAST TECHNIQUE: Multidetector CT imaging of the lower left extremity and ankle was performed according to the standard protocol. RADIATION DOSE REDUCTION: This exam was performed according to the departmental dose-optimization program which includes automated exposure control, adjustment of the mA and/or kV according to patient size and/or use of iterative reconstruction technique. COMPARISON:  X-ray left tibia fibula 11/17/2023 FINDINGS: Bones/Joint/Cartilage Acute comminuted undisplaced mid to distal tibial shaft fracture. Acute full shaft with displaced diagonal proximal fibular shaft fracture. No acute fracture of the bones of the ankle. No acute displaced fracture or dislocation of the visualized bones of the knee. No severe degenerative changes. Ligaments Suboptimally assessed by CT. Muscles and Tendons Grossly unremarkable. Soft tissues Subcutaneus soft tissue edema.  Vascular calcification. IMPRESSION: 1. Acute comminuted undisplaced mid to distal tibial shaft fracture. 2. Acute full shaft with displaced diagonal proximal fibular shaft fracture. 3. No acute fracture of the bones of the ankle. Electronically Signed   By: Tish Frederickson M.D.   On: 11/18/2023 00:01    DG Tibia/Fibula Left Result Date: 11/17/2023 CLINICAL DATA:  Deformity after falling down steps.  Flank pain. EXAM: LEFT TIBIA AND FIBULA - 2 VIEW COMPARISON:  None Available. FINDINGS: Acute comminuted spiral fracture of the mid-distal left tibial diaphysis. Posterior displacement of the distal fragment 1/2 shaft width. Slight apex anterior angulation. Additional displaced oblique fracture of the proximal fibular diaphysis. Slight posterior displacement of the distal fragment with apex lateral angulation. IMPRESSION: 1. Acute comminuted spiral fracture of the mid-distal left tibial diaphysis. 2. Acute displaced oblique fracture of the proximal fibular diaphysis. Electronically Signed   By: Minerva Fester M.D.   On: 11/17/2023 21:58     Medical Consultants:   None.   Subjective:    George Doyle relates his pain is controlled.  Objective:    Vitals:   11/18/23 1939 11/19/23 0436 11/19/23 0651 11/19/23 0917  BP: (!) 156/74 (!) 164/87 (!) 170/88   Pulse: 77 69 68   Resp: 16 17 16    Temp: 98.3 F (36.8 C) 97.6 F (36.4 C) 97.9 F (36.6 C) 98.5 F (36.9 C)  TempSrc:      SpO2: 93% 97% 92%   Weight:   93.4 kg   Height:   5\' 9"  (1.753 m)    SpO2: 92 %  No intake or output data in the 24 hours ending 11/19/23 0921 Filed Weights   11/19/23 0651  Weight: 93.4 kg    Exam: General exam: In no acute distress. Respiratory system: Good air movement and clear to auscultation. Cardiovascular system: S1 & S2 heard,  RRR. No JVD. Gastrointestinal system: Abdomen is nondistended, soft and nontender.  Skin: No rashes, lesions or ulcers Psychiatry: Judgement and insight appear normal. Mood & affect appropriate.    Data Reviewed:    Labs: Basic Metabolic Panel: Recent Labs  Lab 11/17/23 2144 11/18/23 0511  NA 135 134*  K 4.2 4.0  CL 102 104  CO2 26 23  GLUCOSE 104* 96  BUN 24* 24*  CREATININE 1.30* 1.06  CALCIUM 9.4 8.7*   GFR Estimated Creatinine  Clearance: 69 mL/min (by C-G formula based on SCr of 1.06 mg/dL). Liver Function Tests: Recent Labs  Lab 11/18/23 0511  AST 19  ALT 13  ALKPHOS 62  BILITOT 0.8  PROT 6.0*  ALBUMIN 3.5   No results for input(s): "LIPASE", "AMYLASE" in the last 168 hours. No results for input(s): "AMMONIA" in the last 168 hours. Coagulation profile No results for input(s): "INR", "PROTIME" in the last 168 hours. COVID-19 Labs  No results for input(s): "DDIMER", "FERRITIN", "LDH", "CRP" in the last 72 hours.  No results found for: "SARSCOV2NAA"  CBC: Recent Labs  Lab 11/17/23 2144 11/18/23 0511  WBC 11.2* 9.4  HGB 12.8* 12.0*  HCT 38.5* 37.0*  MCV 93.2 94.4  PLT 210 192   Cardiac Enzymes: No results for input(s): "CKTOTAL", "CKMB", "CKMBINDEX", "TROPONINI" in the last 168 hours. BNP (last 3 results) No results for input(s): "PROBNP" in the last 8760 hours. CBG: No results for input(s): "GLUCAP" in the last 168 hours. D-Dimer: No results for input(s): "DDIMER" in the last 72 hours. Hgb A1c: No results for input(s): "HGBA1C" in the last 72 hours. Lipid Profile: No results for input(s): "CHOL", "HDL", "LDLCALC", "TRIG", "CHOLHDL", "LDLDIRECT" in the last 72 hours. Thyroid function studies: No results for input(s): "TSH", "T4TOTAL", "T3FREE", "THYROIDAB" in the last 72 hours.  Invalid input(s): "FREET3" Anemia work up: No results for input(s): "VITAMINB12", "FOLATE", "FERRITIN", "TIBC", "IRON", "RETICCTPCT" in the last 72 hours. Sepsis Labs: Recent Labs  Lab 11/17/23 2144 11/18/23 0511  WBC 11.2* 9.4   Microbiology Recent Results (from the past 240 hours)  Surgical pcr screen     Status: None   Collection Time: 11/19/23 12:03 AM   Specimen: Nasal Mucosa; Nasal Swab  Result Value Ref Range Status   MRSA, PCR NEGATIVE NEGATIVE Final   Staphylococcus aureus NEGATIVE NEGATIVE Final    Comment: (NOTE) The Xpert SA Assay (FDA approved for NASAL specimens in patients 22 years of  age and older), is one component of a comprehensive surveillance program. It is not intended to diagnose infection nor to guide or monitor treatment. Performed at [facility], [address], [place], [place] [address]      Medications:    [MAR Hold] atorvastatin  80 mg Oral Daily   [MAR Hold] citalopram  20 mg Oral QHS   [MAR Hold] donepezil  5 mg Oral QHS   [MAR Hold] lamoTRIgine  200 mg Oral BID   Continuous Infusions:  lactated ringers 10 mL/hr at 11/19/23 0703      LOS: 2 days   Marinda Elk  [facility]  11/19/2023, 9:21 AM

## 2023-11-19 NOTE — Anesthesia Procedure Notes (Signed)
 Procedure Name: Intubation Date/Time: 11/19/2023 7:59 AM  Performed by: Gus Puma, CRNAPre-anesthesia Checklist: Patient identified, Emergency Drugs available, Suction available and Patient being monitored Patient Re-evaluated:Patient Re-evaluated prior to induction Oxygen Delivery Method: Circle System Utilized Preoxygenation: Pre-oxygenation with 100% oxygen Induction Type: IV induction Ventilation: Mask ventilation without difficulty Laryngoscope Size: Glidescope and 4 Grade View: Grade I Tube type: Oral Tube size: 7.5 mm Number of attempts: 1 Airway Equipment and Method: Rigid stylet and Video-laryngoscopy Placement Confirmation: ETT inserted through vocal cords under direct vision, positive ETCO2 and breath sounds checked- equal and bilateral Secured at: 23 cm Tube secured with: Tape Dental Injury: Teeth and Oropharynx as per pre-operative assessment

## 2023-11-19 NOTE — Progress Notes (Signed)
 Arrived from PACU to 5N 5N28.  Vital signs obtained.  Left leg ace wrap is clean, dry, and intact.     11/19/23 1045  Vitals  Temp 98.2 F (36.8 C)  Temp Source Oral  BP (!) 169/70  MAP (mmHg) 97  BP Location Left Arm  BP Method Automatic  Patient Position (if appropriate) Lying  Pulse Rate 68  Pulse Rate Source Dinamap  Resp 17  Level of Consciousness  Level of Consciousness Alert  MEWS COLOR  MEWS Score Color Green  Oxygen Therapy  SpO2 99 %  O2 Device Nasal Cannula  O2 Flow Rate (L/min) 2 L/min  Pain Assessment  Pain Scale 0-10  Pain Score 4  MEWS Score  MEWS Temp 0  MEWS Systolic 0  MEWS Pulse 0  MEWS RR 0  MEWS LOC 0  MEWS Score 0

## 2023-11-19 NOTE — Progress Notes (Signed)
 Mobility Specialist Progress Note:    11/19/23 1100  Oxygen Therapy  O2 Device Nasal Cannula  Mobility  Activity Transferred to/from BSC  Level of Assistance Moderate assist, patient does 50-74%  Assistive Device Front wheel walker  Distance Ambulated (ft) 4 ft  LLE Weight Bearing Per Provider Order WBAT  Activity Response Tolerated well  Mobility visit 1 Mobility  Mobility Specialist Start Time (ACUTE ONLY) 1113  Mobility Specialist Stop Time (ACUTE ONLY) 1120  Mobility Specialist Time Calculation (min) (ACUTE ONLY) 7 min   Pt received in bed, requesting assistance to [facility]. Required modA to stand and pivot from elevated bed. No complaints throughout. Pt left on [facility] with call bell in hand and family present.  [name] Mobility Specialist Please contact via Special educational needs teacher or Rehab office at [phone]

## 2023-11-19 NOTE — Op Note (Signed)
 Orthopaedic Surgery Operative Note (CSN: [id number] ) Date of Surgery: 11/19/2023  Admit Date: 11/17/2023   Diagnoses: Pre-Op Diagnoses: Left tibia/fibula shaft fracture  Post-Op Diagnosis: Same  Procedures: CPT 27759-Intramedullary nailing of left tibia fracture  Surgeons : Primary: Roby Lofts, MD  Assistant: Thyra Breed, PA-C  Location: OR 3   Anesthesia: General   Antibiotics: Ancef 2g preop with 1 gm vancomycin powder placed topically   Tourniquet time: None    Estimated Blood Loss: 50 mL  Complications: None  Specimens:* No specimens in log *   Implants: Implant Name Type Inv. Item Serial No. Manufacturer Lot No. LRB No. Used Action  SCREW CORT AFFIX ST 5X60 - [id number] Screw SCREW CORT AFFIX ST 5X60  [employer](ORTH,TRAU,BIO,SG) [id number] Left 1 Implanted  SCREW CORTICAL BONE 5X36 ST - [id number] Screw SCREW CORTICAL BONE 5X36 ST  [employer]MER [employer]ON(ORTH,TRAU,BIO,SG) [id number] Left 1 Implanted  SCREW CORTICAL BONE 5X42 ST - [id number] Screw SCREW CORTICAL BONE 5X42 ST  Z[employer]MER [employer]ON(ORTH,TRAU,BIO,SG) [id number] Left 1 Implanted  SCREW CORT ST AFFIX 5X32 - [id number] Screw SCREW CORT ST AFFIX 5X32  [employer]MER [employer]ON(ORTH,TRAU,BIO,SG) [id number] Left 1 Implanted  10mm x Tibial Nail    ZIMMER [id number] Left 1 Implanted     Indications for Surgery: 73 year old male who sustained ground-level fall with a left tibial shaft fracture.  Due to the unstable nature of his injury I recommend proceeding with intramedullary nailing of the tibia.  Risks and benefits were discussed with the patient.  Risks included but not limited to bleeding, infection, malunion, nonunion, hardware failure, hardware rotation, nerve and blood vessel injury, DVT, even the possibility anesthetic complications.  He agreed to proceed with surgery and consent was obtained.  Operative Findings: Intramedullary nailing of left tibial shaft fracture using [employer] Affixus 10 x 350 mm nail  Procedure: The  patient was identified in the preoperative holding area. Consent was confirmed with the patient and their family and all questions were answered. The operative extremity was marked after confirmation with the patient. he was then brought back to the operating room by our anesthesia colleagues.  He was placed under general anesthetic and carefully transferred over to radiolucent flattop table.  A bump was placed under his operative hip.  The left lower extremity was then prepped and draped in usual sterile fashion.  A timeout was performed to verify the patient, the procedure, and the extremity.  Preoperative antibiotics were dosed.  Fluoroscopic imaging showed the unstable nature of his injury.  A lateral parapatellar incision was made and carried down through skin and subcutaneous tissue.  I mobilized the patella medially and directed a threaded guidewire at appropriate starting point and advanced into the proximal metaphysis.  I then used an awl to enter the medullary canal.  I then passed a ball-tipped guidewire down the center canal the fracture aligned appropriately and seated it into the distal metaphysis.  I then measured the length and chose to use a 350 mm nail.  I then sequentially reamed from 8 mm to 11 mm and obtained excellent chatter.  I then placed a 10 x 350 mm nail down the center of the canal.  I then used perfect circle technique to place 2 medial to lateral distal interlocking screws.  I then used the targeting arm to place 2 proximal interlocking screws.  The targeting arm was removed and final fluoroscopic imaging was obtained.  The incisions were irrigated and closed with 0 Vicryl, 2-0 Monocryl and 3-0 Monocryl with Dermabond.  Sterile dressings were applied.  The patient was then awoke from anesthesia and taken to the PACU in stable condition.  Post Op Plan/Instructions: Patient be weightbearing as tolerated to the left lower extremity.  He will receive postoperative Ancef.  He will be  placed on aspirin and Plavix for DVT prophylaxis.  Will have him mobilize with physical and Occupational Therapy.  I was present and performed the entire surgery.  Thyra Breed, PA-C did assist me throughout the case. An assistant was necessary given the difficulty in approach, maintenance of reduction and ability to instrument the fracture.   Truitt Merle, MD [facility]

## 2023-11-19 NOTE — Evaluation (Signed)
 Physical Therapy Evaluation Patient Details Name: Gadiel John MRN: [id number] DOB: 1950/10/10 Today's Date: 11/19/2023  History of Present Illness  Bryer Cozzolino is an 73 y.o. male who fell at the [facility] the day prior to admission CT of the tibia and fibula showed acute comminuted undisplaced distal shaft fracture of the tibia and acute shaft fibular fracture. Pt is now s/p IM nailing of L tibia. Past medical history of essential hypertension, PAD CAD subdural hematoma in 2019.  Clinical Impression  Pt presents with admitting diagnosis above. Co-treat with OT. Pt today was able to ambulate to restroom with RW CGA. PTA pt was ind ambulating shorter distances. Recommend HHPT upon DC. Patient needs to practice stairs next session. PT will continue to follow.         If plan is discharge home, recommend the following: A little help with walking and/or transfers;A little help with bathing/dressing/bathroom;Assistance with cooking/housework;Direct supervision/assist for medications management;Assist for transportation;Help with stairs or ramp for entrance   Can travel by private vehicle        Equipment Recommendations None recommended by PT  Recommendations for Other Services       Functional Status Assessment Patient has had a recent decline in their functional status and demonstrates the ability to make significant improvements in function in a reasonable and predictable amount of time.     Precautions / Restrictions Precautions Precautions: Fall Recall of Precautions/Restrictions: Intact Restrictions Weight Bearing Restrictions Per Provider Order: Yes LLE Weight Bearing Per Provider Order: Weight bearing as tolerated      Mobility  Bed Mobility Overal bed mobility: Needs Assistance Bed Mobility: Supine to Sit     Supine to sit: Min assist     General bed mobility comments: Min A for LLE management    Transfers Overall transfer level: Needs  assistance Equipment used: Rolling walker (2 wheels) Transfers: Sit to/from Stand Sit to Stand: Contact guard assist           General transfer comment: Cue for hand placement    Ambulation/Gait Ambulation/Gait assistance: Contact guard assist Gait Distance (Feet): 20 Feet Assistive device: Rolling walker (2 wheels) Gait Pattern/deviations: Antalgic, Decreased stride length, Step-to pattern Gait velocity: decreased     General Gait Details: no LOB noted. Antalgic step to pattern  Stairs            Wheelchair Mobility     Tilt Bed    Modified Rankin (Stroke Patients Only)       Balance Overall balance assessment: Needs assistance Sitting-balance support: Bilateral upper extremity supported Sitting balance-Leahy Scale: Good     Standing balance support: Bilateral upper extremity supported, During functional activity Standing balance-Leahy Scale: Fair Standing balance comment: RW                             Pertinent Vitals/Pain Pain Assessment Pain Assessment: Faces Faces Pain Scale: Hurts even more Pain Location: L knee Pain Descriptors / Indicators: Grimacing, Discomfort Pain Intervention(s): Monitored during session    Home Living Family/patient expects to be discharged to:: Private residence Living Arrangements: Spouse/significant other Available Help at Discharge: Family;Available PRN/intermittently Type of Home: House Home Access: Stairs to enter   Entrance Stairs-Number of Steps: 1   Home Layout: One level Home Equipment: Agricultural consultant (2 wheels);Crutches;Cane - single point;BSC/3in1;Shower seat;Grab bars - tub/shower;Hand held shower head      Prior Function Prior Level of Function : Independent/Modified Independent  Mobility Comments: Ind ADLs Comments: Ind     Extremity/Trunk Assessment   Upper Extremity Assessment Upper Extremity Assessment: Defer to OT evaluation    Lower Extremity  Assessment Lower Extremity Assessment: LLE deficits/detail LLE Deficits / Details: L tib/fib fx    Cervical / Trunk Assessment Cervical / Trunk Assessment: Normal  Communication   Communication Communication: No apparent difficulties    Cognition Arousal: Alert Behavior During Therapy: WFL for tasks assessed/performed   PT - Cognitive impairments: No apparent impairments                         Following commands: Intact       Cueing Cueing Techniques: Verbal cues, Tactile cues     General Comments General comments (skin integrity, edema, etc.): VSS    Exercises     Assessment/Plan    PT Assessment Patient needs continued PT services  PT Problem List Decreased strength;Decreased range of motion;Decreased activity tolerance;Decreased balance;Decreased mobility;Decreased coordination;Decreased knowledge of use of DME;Decreased safety awareness;Cardiopulmonary status limiting activity;Decreased knowledge of precautions;Pain       PT Treatment Interventions      PT Goals (Current goals can be found in the Care Plan section)  Acute Rehab PT Goals Patient Stated Goal: to go home PT Goal Formulation: With patient Time For Goal Achievement: 12/03/23 Potential to Achieve Goals: Good    Frequency Min 1X/week     Co-evaluation   Reason for Co-Treatment: For patient/therapist safety;To address functional/ADL transfers   OT goals addressed during session: ADL's and self-care       AM-PAC PT "6 Clicks" Mobility  Outcome Measure Help needed turning from your back to your side while in a flat bed without using bedrails?: A Little Help needed moving from lying on your back to sitting on the side of a flat bed without using bedrails?: A Little Help needed moving to and from a bed to a chair (including a wheelchair)?: A Little Help needed standing up from a chair using your arms (e.g., wheelchair or bedside chair)?: A Little Help needed to walk in hospital  room?: A Little Help needed climbing 3-5 steps with a railing? : A Lot 6 Click Score: 17    End of Session Equipment Utilized During Treatment: Gait belt Activity Tolerance: Patient tolerated treatment well Patient left: in chair;with call bell/phone within reach;with family/visitor present Nurse Communication: Mobility status PT Visit Diagnosis: Other abnormalities of gait and mobility (R26.89)    Time: 2952-8413 PT Time Calculation (min) (ACUTE ONLY): 28 min   Charges:   PT Evaluation $PT Eval Moderate Complexity: 1 Mod   PT General Charges $$ ACUTE PT VISIT: 1 Visit         Shela Nevin, PT, DPT Acute Rehab Services [phone]   Gladys Damme 11/19/2023, 3:15 PM

## 2023-11-19 NOTE — Progress Notes (Signed)
 Mobility Specialist Progress Note:    11/19/23 1154  Mobility  Activity Transferred to/from [facility]  Level of Assistance Minimal assist, patient does 75% or more  Assistive Device Front wheel walker  Distance Ambulated (ft) 4 ft  LLE Weight Bearing Per Provider Order WBAT  Activity Response Tolerated well  Mobility Referral Yes  Mobility visit 1 Mobility  Mobility Specialist Start Time (ACUTE ONLY) 1145  Mobility Specialist Stop Time (ACUTE ONLY) 1151  Mobility Specialist Time Calculation (min) (ACUTE ONLY) 6 min   Pt received on BSC, requesting assistance back to bed. BM unsuccessful. Able to stand and pivot w/ minA. Pt left in bed with call bell and all needs met. Family present.  [name] Mobility Specialist Please contact via Special educational needs teacher or Rehab office at [phone]

## 2023-11-19 NOTE — H&P (View-Only) (Signed)
 Orthopaedic Trauma Service (OTS) Consult   Patient ID: George Doyle MRN: [id number] DOB/AGE: 1951-03-14 73 y.o.  Reason for Consult:Left tibial shaft fracture Referring Physician: Dr. Samson Frederic, MD [facility]  HPI: George Doyle is an 73 y.o. male who is being seen in consultation at the request of Dr. Linna Caprice for evaluation of left tibial shaft fracture.  Patient had a fall while he is at [facility] sustained the above injury.  Presented [facility] emergency room.  Due to complexity of his injury Dr. Linna Caprice felt that it be best treated by an orthopedic traumatologist.  He was transferred over to [facility].  He is seen and evaluated in the preoperative holding area.  Currently comfortable.  Ambulates without assist device lives at home with his wife..  Denies any other injuries.  Does take Plavix for heart history.  Denies any diabetes.  Past Medical History:  Diagnosis Date   Allergy    Arthritis    knees, hands   Ascending aorta dilatation (HCC) 07/21/2017   a.) TTE 07/21/2017: Ao root 36 mm, asc Ao 41 mm; b.) TTE 10/22/2020: asc Ao 39 mm   Coronary artery disease    Diastolic dysfunction 07/21/2017   a.) TTE 07/21/2017 (following NSTEMI): EF 50-55%, inf HK, mild MR, G1DD   Dyspnea    Former tobacco use    History of 2019 novel coronavirus disease (COVID-19) 10/21/2020   HLD (hyperlipidemia)    Hypertension    NSTEMI (non-ST elevated myocardial infarction) (HCC) 07/20/2017   a.) CP started 5 days prior to presentation (approx 07/15/2017); b.) LHC 07/23/2017: 100% p-d RCA, 60% p-mLCx (tortuous), 99% post atrio --> unsuccessful PCI; c.) referred to interventionist Eldridge Dace) in [place] on 08/30/2017 --> med mgmt recommended   NSTEMI (non-ST elevated myocardial infarction) (HCC) 10/23/2020   a.) Troponins trended: 307-->1304--> 6774 --> 6895 --> 5886 --> 5324; b.) LHC 10/23/2020: EF >55%; 40% pLCx, CTO pRCA --> med mgmt.   Pneumonia    SDH (subdural  hematoma) (HCC) 03/27/2018   a.) 6 mm frontal SDH s/p fall related to tonic-clonic seizure   Seizures (HCC) 1970   a.) last documented seizure was in 10/2020   Umbilical hernia    Vasovagal syncope     Past Surgical History:  Procedure Laterality Date   BICEPT TENODESIS Right 07/18/2018   Procedure: BICEPS TENODESIS;  Surgeon: Christena Flake, MD;  Location: [facility] ORS;  Service: Orthopedics;  Laterality: Right;   CARDIAC CATHETERIZATION     COLONOSCOPY  2011   cleared for 10 yrs   COLONOSCOPY WITH PROPOFOL N/A 11/06/2016   Procedure: COLONOSCOPY WITH PROPOFOL;  Surgeon: Midge Minium, MD;  Location: [facility];  Service: Endoscopy;  Laterality: N/A;   COLONOSCOPY WITH PROPOFOL N/A 05/01/2023   Procedure: COLONOSCOPY WITH PROPOFOL;  Surgeon: Midge Minium, MD;  Location: [facility] ENDOSCOPY;  Service: Endoscopy;  Laterality: N/A;   CORONARY STENT INTERVENTION N/A 07/23/2017   Procedure: CORONARY STENT INTERVENTION;  Surgeon: Alwyn Pea, MD;  Location: [facility] INVASIVE CV LAB;  Service: Cardiovascular;  Laterality: N/A;   CYST EXCISION     Forearm   INGUINAL HERNIA REPAIR Left    INSERTION OF MESH  05/25/2022   Procedure: INSERTION OF MESH;  Surgeon: Henrene Dodge, MD;  Location: [facility] ORS;  Service: General;;   KNEE SURGERY Bilateral    2 on R) 1 on L)   LEFT HEART CATH AND CORONARY ANGIOGRAPHY N/A 07/23/2017   Procedure: LEFT HEART CATH AND CORONARY ANGIOGRAPHY;  Surgeon:  Antonieta Iba, MD;  Location: [facility] INVASIVE CV LAB;  Service: Cardiovascular;  Laterality: N/A;   LEFT HEART CATH AND CORONARY ANGIOGRAPHY Left 10/23/2020   Procedure: LEFT HEART CATH AND CORONARY ANGIOGRAPHY; Location: [facility]   POLYPECTOMY  11/06/2016   Procedure: POLYPECTOMY;  Surgeon: Midge Minium, MD;  Location: [facility];  Service: Endoscopy;;   SHOULDER ARTHROSCOPY WITH OPEN ROTATOR CUFF REPAIR Right 07/18/2018   Procedure: SHOULDER ARTHROSCOPY WITH OPEN ROTATOR CUFF REPAIR;  Surgeon: Christena Flake, MD;  Location: [facility] ORS;  Service: Orthopedics;  Laterality: Right;    Family History  Problem Relation Age of Onset   CAD Father    Lung cancer Father    Heart failure Mother     Social History:  reports that he has quit smoking. His smoking use included cigarettes. He has never used smokeless tobacco. He reports current alcohol use. He reports that he does not use drugs.  Allergies: No Known Allergies  Medications:  No current facility-administered medications on file prior to encounter.   Current Outpatient Medications on File Prior to Encounter  Medication Sig Dispense Refill   acetaminophen (TYLENOL) 500 MG tablet Take 2 tablets (1,000 mg total) by mouth every 6 (six) hours as needed for mild pain.     albuterol (VENTOLIN HFA) 108 (90 Base) MCG/ACT inhaler Inhale 2 puffs into the lungs every 6 (six) hours as needed for wheezing or shortness of breath. 8 g 2   aspirin EC 81 MG EC tablet Take 1 tablet (81 mg total) daily by mouth. 30 tablet 1   atorvastatin (LIPITOR) 80 MG tablet TAKE 1 TABLET(80 MG) BY MOUTH DAILY AT 6 PM 90 tablet 3   carvedilol (COREG) 3.125 MG tablet Take 1 tablet (3.125 mg total) by mouth 2 (two) times daily. 180 tablet 3   citalopram (CELEXA) 20 MG tablet Take 20 mg by mouth at bedtime.     clopidogrel (PLAVIX) 75 MG tablet Take 75 mg once daily for 21 days and then discontinue.  Monitor for signs of bleeding or bruising.     donepezil (ARICEPT) 5 MG tablet Take 5 mg by mouth at bedtime.     EPINEPHrine 0.3 mg/0.3 mL IJ SOAJ injection Inject 0.3 mg into the muscle as needed for anaphylaxis. 1 each 1   fexofenadine (ALLEGRA) 180 MG tablet Take 180 mg by mouth as needed for allergies or rhinitis. otc     lamoTRIgine (LAMICTAL) 200 MG tablet Take 200 mg by mouth 2 (two) times daily.     losartan (COZAAR) 50 MG tablet Take 1 tablet (50 mg total) by mouth 2 (two) times daily. 180 tablet 3   nitroGLYCERIN (NITROSTAT) 0.4 MG SL tablet PLACE 1 TABLET UNDER THE  TONGUE EVERY 5 MINUTES FOR 3 DOSES AS NEEDED FOR CHEST PAIN 25 tablet 0   spironolactone (ALDACTONE) 25 MG tablet Take 1 tablet (25 mg total) by mouth daily. 90 tablet 3     ROS: Constitutional: No fever or chills Vision: No changes in vision ENT: No difficulty swallowing CV: No chest pain Pulm: No SOB or wheezing GI: No nausea or vomiting GU: No urgency or inability to hold urine Skin: No poor wound healing Neurologic: No numbness or tingling Psychiatric: No depression or anxiety Heme: No bruising Allergic: No reaction to medications or food   Exam: Blood pressure (!) 170/88, pulse 68, temperature 97.9 F (36.6 C), resp. rate 16, height 5\' 9"  (1.753 m), weight 93.4 kg, SpO2 92%. General: No acute distress  Orientation: Awake alert and oriented x 3 Mood and Affect: Cooperative and pleasant Gait: Unable to assess due to his fractures Coordination and balance: Within normal limits  Left lower extremity: Short leg splint is in place is clean dry and intact.  Compartments are soft compressible.  He is able to dorsiflex plantarflex his toes and ankle.  He has sensation intact to light touch.  He is warm well-perfused foot.  He has brisk cap refill less than 2 seconds.  Splint prevents palpation of his pulses.  Right lower extremity: Skin without lesions. No tenderness to palpation. Full painless ROM, full strength in each muscle groups without evidence of instability.   Medical Decision Making: Data: Imaging:  X-rays and CT scan of been reviewed which shows extra-articular tibial shaft fracture with extension into the distal third.  Labs:  Results for orders placed or performed during the hospital encounter of 11/17/23 (from the past 24 hours)  Surgical pcr screen     Status: None   Collection Time: 11/19/23 12:03 AM   Specimen: Nasal Mucosa; Nasal Swab  Result Value Ref Range   MRSA, PCR NEGATIVE NEGATIVE   Staphylococcus aureus NEGATIVE NEGATIVE     Imaging or Labs ordered:  None  Medical history and chart was reviewed and case discussed with medical provider.  Assessment/Plan: 73 year old male with a left tibia fracture.  Due to the unstable nature of his injury I recommend proceeding with intramedullary nailing of his left tibia.  Risks and benefits were discussed with the patient.  Risks included but not limited to bleeding, infection, malunion, nonunion, hardware failure, hardware rotation, nerve and blood vessel injury, DVT, even the possibility anesthetic complications.  He agreed to proceed with surgery and consent was obtained.  Roby Lofts, MD [facility] [phone] (office) [email or web address]

## 2023-11-19 NOTE — Interval H&P Note (Signed)
 History and Physical Interval Note:  11/19/2023 7:39 AM  George Doyle  has presented today for surgery, with the diagnosis of Left tibia fracture.  The various methods of treatment have been discussed with the patient and family. After consideration of risks, benefits and other options for treatment, the patient has consented to  Procedure(s): INTRAMEDULLARY (IM) NAIL TIBIAL (Left) as a surgical intervention.  [name] history has been reviewed, patient examined, no change in status, stable for surgery.  I have reviewed the patient's chart and labs.  Questions were answered to the patient's satisfaction.     Caryn Bee P Trellis Vanoverbeke

## 2023-11-19 NOTE — Anesthesia Preprocedure Evaluation (Addendum)
 Anesthesia Evaluation  Patient identified by MRN, date of birth, ID band Patient awake    Reviewed: Allergy & Precautions, NPO status , Patient's Chart, lab work & pertinent test results  Airway Mallampati: IV  TM Distance: >3 FB Neck ROM: Full    Dental  (+) Dental Advisory Given   Pulmonary former smoker   breath sounds clear to auscultation       Cardiovascular hypertension, Pt. on medications and Pt. on home beta blockers + CAD and + Peripheral Vascular Disease  + dysrhythmias  Rhythm:Regular Rate:Normal     Neuro/Psych Seizures -,  CVA    GI/Hepatic negative GI ROS, Neg liver ROS,,,  Endo/Other  negative endocrine ROS    Renal/GU negative Renal ROS     Musculoskeletal  (+) Arthritis ,    Abdominal   Peds  Hematology negative hematology ROS (+)   Anesthesia Other Findings   Reproductive/Obstetrics                              Anesthesia Physical Anesthesia Plan  ASA: 3  Anesthesia Plan: General   Post-op Pain Management: Tylenol PO (pre-op)*   Induction: Intravenous  PONV Risk Score and Plan: 2 and Dexamethasone, Ondansetron and Treatment may vary due to age or medical condition  Airway Management Planned: Oral ETT and Video Laryngoscope Planned  Additional Equipment:   Intra-op Plan:   Post-operative Plan: Extubation in OR  Informed Consent: I have reviewed the patients History and Physical, chart, labs and discussed the procedure including the risks, benefits and alternatives for the proposed anesthesia with the patient or authorized representative who has indicated his/her understanding and acceptance.     Dental advisory given  Plan Discussed with: CRNA  Anesthesia Plan Comments:         Anesthesia Quick Evaluation

## 2023-11-19 NOTE — Evaluation (Signed)
 Occupational Therapy Evaluation Patient Details Name: George Doyle MRN: [id number] DOB: 11/25/1950 Today's Date: 11/19/2023   History of Present Illness   George Doyle is an 73 y.o. male who fell at the [facility] the day prior to admission CT of the tibia and fibula showed acute comminuted undisplaced distal shaft fracture of the tibia and acute shaft fibular fracture. Pt is now s/p IM nailing of L tibia. Past medical history of essential hypertension, PAD CAD subdural hematoma in 2019.     Clinical Impressions Pt admitted based on problem above, seen based on list below. PTA pt was independent with ADLs and IADLs. Today pt is set up to min assist for ADLs. Bed mobility min assist for LLE. Seated EOB, pt able to don/doff socks with s for safety and increased time. Transfers CGA with RW. Ambulated to bathroom with RW and CGA. Grooming completed at the sink standing with CGA. Toilet transfer simulated with RW and CGA. Cues throughout session for breathing for pain management. Family present for session and was supportive. HHOT recommended for follow up. OT will continue to follow acutely to maximize functional independence.      If plan is discharge home, recommend the following:   A little help with walking and/or transfers;A little help with bathing/dressing/bathroom;Assistance with cooking/housework;Assistance with feeding     Functional Status Assessment   Patient has had a recent decline in their functional status and demonstrates the ability to make significant improvements in function in a reasonable and predictable amount of time.     Equipment Recommendations   None recommended by OT     Recommendations for Other Services         Precautions/Restrictions   Precautions Precautions: Fall Recall of Precautions/Restrictions: Intact Required Braces or Orthoses: Other Brace Other Brace: CAM Walker Restrictions Weight Bearing Restrictions Per  Provider Order: Yes LLE Weight Bearing Per Provider Order: Weight bearing as tolerated     Mobility Bed Mobility Overal bed mobility: Needs Assistance Bed Mobility: Supine to Sit     Supine to sit: Min assist, HOB elevated, Used rails     General bed mobility comments: Assist w/ LLE    Transfers Overall transfer level: Needs assistance Equipment used: Rolling walker (2 wheels) Transfers: Sit to/from Stand Sit to Stand: Contact guard assist           General transfer comment: Cues for hand placement      Balance Overall balance assessment: Needs assistance Sitting-balance support: Bilateral upper extremity supported, Feet supported Sitting balance-Leahy Scale: Good Sitting balance - Comments: Pt able to don socks without loss of balance   Standing balance support: Bilateral upper extremity supported, During functional activity, Reliant on assistive device for balance Standing balance-Leahy Scale: Fair Standing balance comment: RW                           ADL either performed or assessed with clinical judgement   ADL Overall ADL's : Needs assistance/impaired Eating/Feeding: Set up;Sitting   Grooming: Wash/dry hands;Contact guard assist;Standing           Upper Body Dressing : Set up;Sitting Upper Body Dressing Details (indicate cue type and reason): Donned gown seated Lower Body Dressing: Minimal assistance;Sitting/lateral leans Lower Body Dressing Details (indicate cue type and reason): Increased time to don socks seated EOB Toilet Transfer: Contact guard assist;Ambulation;Rolling walker (2 wheels) Toilet Transfer Details (indicate cue type and reason): Simulated in room  Functional mobility during ADLs: Contact guard assist;Rolling walker (2 wheels);Cueing for safety       Vision Baseline Vision/History: 0 No visual deficits Vision Assessment?: No apparent visual deficits     Perception         Praxis         Pertinent  Vitals/Pain Pain Assessment Pain Assessment: Faces Faces Pain Scale: Hurts even more Pain Location: LLE Pain Descriptors / Indicators: Aching, Grimacing, Moaning Pain Intervention(s): Monitored during session, Premedicated before session, Repositioned, Ice applied     Extremity/Trunk Assessment Upper Extremity Assessment Upper Extremity Assessment: Defer to OT evaluation   Lower Extremity Assessment Lower Extremity Assessment: LLE deficits/detail LLE Deficits / Details: L tib/fib fx   Cervical / Trunk Assessment Cervical / Trunk Assessment: Normal   Communication Communication Communication: No apparent difficulties   Cognition Arousal: Alert Behavior During Therapy: WFL for tasks assessed/performed Cognition: No apparent impairments                               Following commands: Intact       Cueing  General Comments   Cueing Techniques: Verbal cues;Tactile cues  VSS   Exercises     Shoulder Instructions      Home Living Family/patient expects to be discharged to:: Private residence Living Arrangements: Spouse/significant other Available Help at Discharge: Family;Available PRN/intermittently Type of Home: House Home Access: Stairs to enter Entrance Stairs-Number of Steps: 1   Home Layout: One level     Bathroom Shower/Tub: Walk-in shower;Door   [employer] Toilet: Standard Bathroom Accessibility: Yes How Accessible: Accessible via walker Home Equipment: Agricultural consultant (2 wheels);Crutches;Cane - single point;BSC/3in1;Shower seat;Grab bars - tub/shower;Hand held shower head          Prior Functioning/Environment Prior Level of Function : Independent/Modified Independent             Mobility Comments: Ind ADLs Comments: Ind    OT Problem List: Decreased strength;Decreased range of motion;Decreased activity tolerance;Impaired balance (sitting and/or standing);Pain   OT Treatment/Interventions: Self-care/ADL training;Therapeutic  exercise;Energy conservation;DME and/or AE instruction;Balance training;Patient/family education      OT Goals(Current goals can be found in the care plan section)   Acute Rehab OT Goals Patient Stated Goal: To go home OT Goal Formulation: With patient Time For Goal Achievement: 12/03/23 Potential to Achieve Goals: Good   OT Frequency:  Min 1X/week    Co-evaluation              AM-PAC OT "6 Clicks" Daily Activity     Outcome Measure Help from another person eating meals?: A Little Help from another person taking care of personal grooming?: A Little Help from another person toileting, which includes using toliet, bedpan, or urinal?: A Little Help from another person bathing (including washing, rinsing, drying)?: A Little Help from another person to put on and taking off regular upper body clothing?: A Little Help from another person to put on and taking off regular lower body clothing?: A Little 6 Click Score: 18   End of Session Equipment Utilized During Treatment: Gait belt;Rolling walker (2 wheels) Nurse Communication: Mobility status  Activity Tolerance: Patient limited by pain;Patient tolerated treatment well Patient left: in chair;with call bell/phone within reach;with chair alarm set;with family/visitor present  OT Visit Diagnosis: Unsteadiness on feet (R26.81);Other abnormalities of gait and mobility (R26.89)                Time: 1610-9604 OT Time Calculation (  min): 34 min Charges:  OT General Charges $OT Visit: 1 Visit OT Evaluation $OT Eval Low Complexity: 1 Low  Ivor Messier, OT  Acute Rehabilitation Services Office [phone] Secure chat preferred   Marilynne Drivers 11/19/2023, 3:09 PM

## 2023-11-19 NOTE — Anesthesia Postprocedure Evaluation (Signed)
 Anesthesia Post Note  Patient: George Doyle  Procedure(s) Performed: INTRAMEDULLARY (IM) NAIL TIBIAL (Left)     Patient location during evaluation: PACU Anesthesia Type: General Level of consciousness: awake and alert Pain management: pain level controlled Vital Signs Assessment: post-procedure vital signs reviewed and stable Respiratory status: spontaneous breathing, nonlabored ventilation, respiratory function stable and patient connected to nasal cannula oxygen Cardiovascular status: blood pressure returned to baseline and stable Postop Assessment: no apparent nausea or vomiting Anesthetic complications: no  No notable events documented.  Last Vitals:  Vitals:   11/19/23 1030 11/19/23 1045  BP: (!) 170/86 (!) 169/70  Pulse: 64 68  Resp: 15 17  Temp: 36.4 C 36.8 C  SpO2: 94% 99%    Last Pain:  Vitals:   11/19/23 1223  TempSrc:   PainSc: 6                  Kennieth Rad

## 2023-11-19 NOTE — Progress Notes (Signed)
 Patient is non compliant about asking staff to help to get up was told multiple times to call the call light is within reach. He keeps asking to turn bed alarm off I explained because he is considered a fall risk the bed alarm will stay on.

## 2023-11-19 NOTE — Progress Notes (Signed)
 Orthopedic Tech Progress Note Patient Details:  George Doyle [birth year] [id number]  Ortho Devices Type of Ortho Device: CAM walker Ortho Device/Splint Location: LLE Ortho Device/Splint Interventions: Ordered, Application, Adjustment   Post Interventions Patient Tolerated: Fair, Well Instructions Provided: Care of device  Donald Pore 11/19/2023, 11:23 AM

## 2023-11-19 NOTE — Consult Note (Signed)
 Orthopaedic Trauma Service (OTS) Consult   Patient ID: George Doyle MRN: [id number] DOB/AGE: 1951-03-14 73 y.o.  Reason for Consult:Left tibial shaft fracture Referring Physician: Dr. Samson Frederic, MD [facility]  HPI: George Doyle is an 73 y.o. male who is being seen in consultation at the request of Dr. Linna Caprice for evaluation of left tibial shaft fracture.  Patient had a fall while he is at [facility] sustained the above injury.  Presented [facility] emergency room.  Due to complexity of his injury Dr. Linna Caprice felt that it be best treated by an orthopedic traumatologist.  He was transferred over to [facility].  He is seen and evaluated in the preoperative holding area.  Currently comfortable.  Ambulates without assist device lives at home with his wife..  Denies any other injuries.  Does take Plavix for heart history.  Denies any diabetes.  Past Medical History:  Diagnosis Date   Allergy    Arthritis    knees, hands   Ascending aorta dilatation (HCC) 07/21/2017   a.) TTE 07/21/2017: Ao root 36 mm, asc Ao 41 mm; b.) TTE 10/22/2020: asc Ao 39 mm   Coronary artery disease    Diastolic dysfunction 07/21/2017   a.) TTE 07/21/2017 (following NSTEMI): EF 50-55%, inf HK, mild MR, G1DD   Dyspnea    Former tobacco use    History of 2019 novel coronavirus disease (COVID-19) 10/21/2020   HLD (hyperlipidemia)    Hypertension    NSTEMI (non-ST elevated myocardial infarction) (HCC) 07/20/2017   a.) CP started 5 days prior to presentation (approx 07/15/2017); b.) LHC 07/23/2017: 100% p-d RCA, 60% p-mLCx (tortuous), 99% post atrio --> unsuccessful PCI; c.) referred to interventionist Eldridge Dace) in [place] on 08/30/2017 --> med mgmt recommended   NSTEMI (non-ST elevated myocardial infarction) (HCC) 10/23/2020   a.) Troponins trended: 307-->1304--> 6774 --> 6895 --> 5886 --> 5324; b.) LHC 10/23/2020: EF >55%; 40% pLCx, CTO pRCA --> med mgmt.   Pneumonia    SDH (subdural  hematoma) (HCC) 03/27/2018   a.) 6 mm frontal SDH s/p fall related to tonic-clonic seizure   Seizures (HCC) 1970   a.) last documented seizure was in 10/2020   Umbilical hernia    Vasovagal syncope     Past Surgical History:  Procedure Laterality Date   BICEPT TENODESIS Right 07/18/2018   Procedure: BICEPS TENODESIS;  Surgeon: Christena Flake, MD;  Location: [facility] ORS;  Service: Orthopedics;  Laterality: Right;   CARDIAC CATHETERIZATION     COLONOSCOPY  2011   cleared for 10 yrs   COLONOSCOPY WITH PROPOFOL N/A 11/06/2016   Procedure: COLONOSCOPY WITH PROPOFOL;  Surgeon: Midge Minium, MD;  Location: [facility];  Service: Endoscopy;  Laterality: N/A;   COLONOSCOPY WITH PROPOFOL N/A 05/01/2023   Procedure: COLONOSCOPY WITH PROPOFOL;  Surgeon: Midge Minium, MD;  Location: [facility] ENDOSCOPY;  Service: Endoscopy;  Laterality: N/A;   CORONARY STENT INTERVENTION N/A 07/23/2017   Procedure: CORONARY STENT INTERVENTION;  Surgeon: Alwyn Pea, MD;  Location: [facility] INVASIVE CV LAB;  Service: Cardiovascular;  Laterality: N/A;   CYST EXCISION     Forearm   INGUINAL HERNIA REPAIR Left    INSERTION OF MESH  05/25/2022   Procedure: INSERTION OF MESH;  Surgeon: Henrene Dodge, MD;  Location: [facility] ORS;  Service: General;;   KNEE SURGERY Bilateral    2 on R) 1 on L)   LEFT HEART CATH AND CORONARY ANGIOGRAPHY N/A 07/23/2017   Procedure: LEFT HEART CATH AND CORONARY ANGIOGRAPHY;  Surgeon:  Antonieta Iba, MD;  Location: [facility] INVASIVE CV LAB;  Service: Cardiovascular;  Laterality: N/A;   LEFT HEART CATH AND CORONARY ANGIOGRAPHY Left 10/23/2020   Procedure: LEFT HEART CATH AND CORONARY ANGIOGRAPHY; Location: [facility]   POLYPECTOMY  11/06/2016   Procedure: POLYPECTOMY;  Surgeon: Midge Minium, MD;  Location: [facility];  Service: Endoscopy;;   SHOULDER ARTHROSCOPY WITH OPEN ROTATOR CUFF REPAIR Right 07/18/2018   Procedure: SHOULDER ARTHROSCOPY WITH OPEN ROTATOR CUFF REPAIR;  Surgeon: Christena Flake, MD;  Location: [facility] ORS;  Service: Orthopedics;  Laterality: Right;    Family History  Problem Relation Age of Onset   CAD Father    Lung cancer Father    Heart failure Mother     Social History:  reports that he has quit smoking. His smoking use included cigarettes. He has never used smokeless tobacco. He reports current alcohol use. He reports that he does not use drugs.  Allergies: No Known Allergies  Medications:  No current facility-administered medications on file prior to encounter.   Current Outpatient Medications on File Prior to Encounter  Medication Sig Dispense Refill   acetaminophen (TYLENOL) 500 MG tablet Take 2 tablets (1,000 mg total) by mouth every 6 (six) hours as needed for mild pain.     albuterol (VENTOLIN HFA) 108 (90 Base) MCG/ACT inhaler Inhale 2 puffs into the lungs every 6 (six) hours as needed for wheezing or shortness of breath. 8 g 2   aspirin EC 81 MG EC tablet Take 1 tablet (81 mg total) daily by mouth. 30 tablet 1   atorvastatin (LIPITOR) 80 MG tablet TAKE 1 TABLET(80 MG) BY MOUTH DAILY AT 6 PM 90 tablet 3   carvedilol (COREG) 3.125 MG tablet Take 1 tablet (3.125 mg total) by mouth 2 (two) times daily. 180 tablet 3   citalopram (CELEXA) 20 MG tablet Take 20 mg by mouth at bedtime.     clopidogrel (PLAVIX) 75 MG tablet Take 75 mg once daily for 21 days and then discontinue.  Monitor for signs of bleeding or bruising.     donepezil (ARICEPT) 5 MG tablet Take 5 mg by mouth at bedtime.     EPINEPHrine 0.3 mg/0.3 mL IJ SOAJ injection Inject 0.3 mg into the muscle as needed for anaphylaxis. 1 each 1   fexofenadine (ALLEGRA) 180 MG tablet Take 180 mg by mouth as needed for allergies or rhinitis. otc     lamoTRIgine (LAMICTAL) 200 MG tablet Take 200 mg by mouth 2 (two) times daily.     losartan (COZAAR) 50 MG tablet Take 1 tablet (50 mg total) by mouth 2 (two) times daily. 180 tablet 3   nitroGLYCERIN (NITROSTAT) 0.4 MG SL tablet PLACE 1 TABLET UNDER THE  TONGUE EVERY 5 MINUTES FOR 3 DOSES AS NEEDED FOR CHEST PAIN 25 tablet 0   spironolactone (ALDACTONE) 25 MG tablet Take 1 tablet (25 mg total) by mouth daily. 90 tablet 3     ROS: Constitutional: No fever or chills Vision: No changes in vision ENT: No difficulty swallowing CV: No chest pain Pulm: No SOB or wheezing GI: No nausea or vomiting GU: No urgency or inability to hold urine Skin: No poor wound healing Neurologic: No numbness or tingling Psychiatric: No depression or anxiety Heme: No bruising Allergic: No reaction to medications or food   Exam: Blood pressure (!) 170/88, pulse 68, temperature 97.9 F (36.6 C), resp. rate 16, height 5\' 9"  (1.753 m), weight 93.4 kg, SpO2 92%. General: No acute distress  Orientation: Awake alert and oriented x 3 Mood and Affect: Cooperative and pleasant Gait: Unable to assess due to his fractures Coordination and balance: Within normal limits  Left lower extremity: Short leg splint is in place is clean dry and intact.  Compartments are soft compressible.  He is able to dorsiflex plantarflex his toes and ankle.  He has sensation intact to light touch.  He is warm well-perfused foot.  He has brisk cap refill less than 2 seconds.  Splint prevents palpation of his pulses.  Right lower extremity: Skin without lesions. No tenderness to palpation. Full painless ROM, full strength in each muscle groups without evidence of instability.   Medical Decision Making: Data: Imaging:  X-rays and CT scan of been reviewed which shows extra-articular tibial shaft fracture with extension into the distal third.  Labs:  Results for orders placed or performed during the hospital encounter of 11/17/23 (from the past 24 hours)  Surgical pcr screen     Status: None   Collection Time: 11/19/23 12:03 AM   Specimen: Nasal Mucosa; Nasal Swab  Result Value Ref Range   MRSA, PCR NEGATIVE NEGATIVE   Staphylococcus aureus NEGATIVE NEGATIVE     Imaging or Labs ordered:  None  Medical history and chart was reviewed and case discussed with medical provider.  Assessment/Plan: 73 year old male with a left tibia fracture.  Due to the unstable nature of his injury I recommend proceeding with intramedullary nailing of his left tibia.  Risks and benefits were discussed with the patient.  Risks included but not limited to bleeding, infection, malunion, nonunion, hardware failure, hardware rotation, nerve and blood vessel injury, DVT, even the possibility anesthetic complications.  He agreed to proceed with surgery and consent was obtained.  Roby Lofts, MD [facility] [phone] (office) [email or web address]

## 2023-11-19 NOTE — Progress Notes (Signed)
 Transition of Care [facility]) - CAGE-AID Screening   Patient Details  Name: Andrews Tener MRN: [id number] Date of Birth: [birth year]  [facility] CM/SW Contact:    Katha Hamming, RN Phone Number: 11/19/2023, 8:43 PM   Clinical Narrative:  Denies alcohol and drug use  CAGE-AID Screening:    Have You Ever Felt You Ought to Cut Down on Your Drinking or Drug Use?: No Have People Annoyed You By Critizing Your Drinking Or Drug Use?: No Have You Felt Bad Or Guilty About Your Drinking Or Drug Use?: No Have You Ever Had a Drink or Used Drugs First Thing In The Morning to Steady Your Nerves or to Get Rid of a Hangover?: No CAGE-AID Score: 0  Substance Abuse Education Offered: No

## 2023-11-19 NOTE — Plan of Care (Signed)
 Alert and oriented.  Medicated for pain post-operatively on left leg.  OOB to chair and up to bathroom with PT/OT.   Problem: Education: Goal: Knowledge of General Education information will improve Description: Including pain rating scale, medication(s)/side effects and non-pharmacologic comfort measures Outcome: Progressing   Problem: Health Behavior/Discharge Planning: Goal: Ability to manage health-related needs will improve Outcome: Progressing   Problem: Clinical Measurements: Goal: Ability to maintain clinical measurements within normal limits will improve Outcome: Progressing Goal: Will remain free from infection Outcome: Progressing Goal: Diagnostic test results will improve Outcome: Progressing

## 2023-11-19 NOTE — Transfer of Care (Signed)
 Immediate Anesthesia Transfer of Care Note  Patient: George Doyle  Procedure(s) Performed: INTRAMEDULLARY (IM) NAIL TIBIAL (Left)  Patient Location: PACU  Anesthesia Type:General  Level of Consciousness: drowsy and patient cooperative  Airway & Oxygen Therapy: Patient Spontanous Breathing and Patient connected to nasal cannula oxygen  Post-op Assessment: Report given to RN and Post -op Vital signs reviewed and stable  Post vital signs: Reviewed and stable  Last Vitals:  Vitals Value Taken Time  BP 170/75 11/19/23 0917  Temp    Pulse 70 11/19/23 0919  Resp 10 11/19/23 0919  SpO2 92 % 11/19/23 0919  Vitals shown include unfiled device data.  Last Pain:  Vitals:   11/19/23 0651  TempSrc:   PainSc: 9       Patients Stated Pain Goal: 0 (11/19/23 0651)  Complications: No notable events documented.

## 2023-11-19 NOTE — Discharge Instructions (Signed)
 Orthopaedic Trauma Service Discharge Instructions   General Discharge Instructions  WEIGHT BEARING STATUS:weightbearing as tolerated in CAM boot  RANGE OF MOTION/ACTIVITY: Ok for knee and ankle motion as tolerated  Wound Care: You may remove your surgical dressing on post op day 2, (Wednesday 11/21/23). Incisions can be left open to air if there is no drainage. Once the incision is completely dry and without drainage, it may be left open to air out.  Showering may begin post op day 3, (Thursday 11/22/23).  Clean incision gently with soap and water.  DVT/PE prophylaxis: Aspirin 325 mg daily x 30 days  Diet: as you were eating previously.  Can use over the counter stool softeners and bowel preparations, such as Miralax, to help with bowel movements.  Narcotics can be constipating.  Be sure to drink plenty of fluids  PAIN MEDICATION USE AND EXPECTATIONS  You have likely been given narcotic medications to help control your pain.  After a traumatic event that results in an fracture (broken bone) with or without surgery, it is ok to use narcotic pain medications to help control one's pain.  We understand that everyone responds to pain differently and each individual patient will be evaluated on a regular basis for the continued need for narcotic medications. Ideally, narcotic medication use should last no more than 6-8 weeks (coinciding with fracture healing).   As a patient it is your responsibility as well to monitor narcotic medication use and report the amount and frequency you use these medications when you come to your office visit.   We would also advise that if you are using narcotic medications, you should take a dose prior to therapy to maximize you participation.  IF YOU ARE ON NARCOTIC MEDICATIONS IT IS NOT PERMISSIBLE TO OPERATE A MOTOR VEHICLE (MOTORCYCLE/CAR/TRUCK/MOPED) OR HEAVY MACHINERY DO NOT MIX NARCOTICS WITH OTHER CNS (CENTRAL NERVOUS SYSTEM) DEPRESSANTS SUCH AS ALCOHOL   STOP  SMOKING OR USING NICOTINE PRODUCTS!!!!  As discussed nicotine severely impairs your body's ability to heal surgical and traumatic wounds but also impairs bone healing.  Wounds and bone heal by forming microscopic blood vessels (angiogenesis) and nicotine is a vasoconstrictor (essentially, shrinks blood vessels).  Therefore, if vasoconstriction occurs to these microscopic blood vessels they essentially disappear and are unable to deliver necessary nutrients to the healing tissue.  This is one modifiable factor that you can do to dramatically increase your chances of healing your injury.    (This means no smoking, no nicotine gum, patches, etc)  DO NOT USE NONSTEROIDAL ANTI-INFLAMMATORY DRUGS (NSAID'S)  Using products such as Advil (ibuprofen), Aleve (naproxen), Motrin (ibuprofen) for additional pain control during fracture healing can delay and/or prevent the healing response.  If you would like to take over the counter (OTC) medication, Tylenol (acetaminophen) is ok.  However, some narcotic medications that are given for pain control contain acetaminophen as well. Therefore, you should not exceed more than 4000 mg of tylenol in a day if you do not have liver disease.  Also note that there are may OTC medicines, such as cold medicines and allergy medicines that my contain tylenol as well.  If you have any questions about medications and/or interactions please ask your doctor/PA or your pharmacist.      ICE AND ELEVATE INJURED/OPERATIVE EXTREMITY  Using ice and elevating the injured extremity above your heart can help with swelling and pain control.  Icing in a pulsatile fashion, such as 20 minutes on and 20 minutes off, can be followed.  Do not place ice directly on skin. Make sure there is a barrier between to skin and the ice pack.    Using frozen items such as frozen peas works well as the conform nicely to the are that needs to be iced.  USE AN ACE WRAP OR TED HOSE FOR SWELLING CONTROL  In  addition to icing and elevation, Ace wraps or TED hose are used to help limit and resolve swelling.  It is recommended to use Ace wraps or TED hose until you are informed to stop.    When using Ace Wraps start the wrapping distally (farthest away from the body) and wrap proximally (closer to the body)   Example: If you had surgery on your leg or thing and you do not have a splint on, start the ace wrap at the toes and work your way up to the thigh        If you had surgery on your upper extremity and do not have a splint on, start the ace wrap at your fingers and work your way up to the upper arm  IF YOU ARE IN A CAM BOOT (BLACK BOOT)  You may remove boot periodically. Perform daily dressing changes as noted below.  Wash the liner of the boot regularly and wear a sock when wearing the boot. It is recommended that you sleep in the boot until told otherwise   CALL THE OFFICE WITH ANY QUESTIONS OR CONCERNS: [phone]   VISIT OUR WEBSITE FOR ADDITIONAL INFORMATION: [email or web address]  Discharge Wound Care Instructions  Do NOT apply any ointments, solutions or lotions to pin sites or surgical wounds.  These prevent needed drainage and even though solutions like hydrogen peroxide kill bacteria, they also damage cells lining the pin sites that help fight infection.  Applying lotions or ointments can keep the wounds moist and can cause them to breakdown and open up as well. This can increase the risk for infection. When in doubt call the office.  Surgical incisions should be dressed daily.  If any drainage is noted, use one layer of adaptic or Mepitel, then gauze, Kerlix, and an ace wrap. - These dressing supplies should be available at local medical supply stores [facility], [facility], etc) as well as Insurance claims handler ([facility], [facility], [place], etc)  Once the incision is completely dry and without drainage, it may be left open to air out.  Showering may begin 36-48 hours later.  Cleaning  gently with soap and water.

## 2023-11-20 ENCOUNTER — Encounter (HOSPITAL_COMMUNITY): Payer: Self-pay | Admitting: Student

## 2023-11-20 ENCOUNTER — Ambulatory Visit: Payer: Medicare HMO

## 2023-11-20 DIAGNOSIS — S82832A Other fracture of upper and lower end of left fibula, initial encounter for closed fracture: Secondary | ICD-10-CM | POA: Diagnosis not present

## 2023-11-20 DIAGNOSIS — S82242A Displaced spiral fracture of shaft of left tibia, initial encounter for closed fracture: Secondary | ICD-10-CM | POA: Diagnosis not present

## 2023-11-20 DIAGNOSIS — S82202A Unspecified fracture of shaft of left tibia, initial encounter for closed fracture: Secondary | ICD-10-CM | POA: Diagnosis not present

## 2023-11-20 DIAGNOSIS — S82402A Unspecified fracture of shaft of left fibula, initial encounter for closed fracture: Secondary | ICD-10-CM | POA: Diagnosis not present

## 2023-11-20 LAB — BASIC METABOLIC PANEL
Anion gap: 10 (ref 5–15)
BUN: 13 mg/dL (ref 8–23)
CO2: 24 mmol/L (ref 22–32)
Calcium: 8.8 mg/dL — ABNORMAL LOW (ref 8.9–10.3)
Chloride: 100 mmol/L (ref 98–111)
Creatinine, Ser: 1.01 mg/dL (ref 0.61–1.24)
GFR, Estimated: >60 mL/min (ref >60)
Glucose, Bld: 117 mg/dL — ABNORMAL HIGH (ref 70–99)
Potassium: 3.9 mmol/L (ref 3.5–5.1)
Sodium: 134 mmol/L — ABNORMAL LOW (ref 135–145)

## 2023-11-20 LAB — CBC
HCT: 34.3 % — ABNORMAL LOW (ref 39.0–52.0)
Hemoglobin: 11.7 g/dL — ABNORMAL LOW (ref 13.0–17.0)
MCH: 31.1 pg (ref 26.0–34.0)
MCHC: 34.1 g/dL (ref 30.0–36.0)
MCV: 91.2 fL (ref 80.0–100.0)
Platelets: 186 10*3/uL (ref 150–400)
RBC: 3.76 MIL/uL — ABNORMAL LOW (ref 4.22–5.81)
RDW: 13.2 % (ref 11.5–15.5)
WBC: 9.7 10*3/uL (ref 4.0–10.5)
nRBC: 0 % (ref 0.0–0.2)

## 2023-11-20 MED ORDER — OXYCODONE HCL 5 MG PO TABS: 5.0000 mg | ORAL_TABLET | ORAL | 0 refills | Status: DC | PRN

## 2023-11-20 MED ORDER — VITAMIN D3 25 MCG PO TABS: 1000.0000 [IU] | ORAL_TABLET | Freq: Every day | ORAL | 0 refills | Status: AC

## 2023-11-20 MED ORDER — METHOCARBAMOL 500 MG PO TABS: 500.0000 mg | ORAL_TABLET | Freq: Three times a day (TID) | ORAL | 0 refills | Status: AC | PRN

## 2023-11-20 MED ORDER — VITAMIN D 25 MCG (1000 UNIT) PO TABS
1000.0000 [IU] | ORAL_TABLET | Freq: Every day | ORAL | Status: DC
  Administered 2023-11-20 – 2023-11-21 (×2): 1000 [IU] via ORAL
  Filled 2023-11-20 (×2): qty 1

## 2023-11-20 NOTE — Progress Notes (Signed)
 Orthopaedic Trauma Progress Note  SUBJECTIVE: Doing well this morning.  Pain well-controlled in the left lower extremity.  Was able to mobilize with therapies yesterday afternoon and states this went well.  Tolerating diet and fluids.  No chest pain. No SOB. No nausea/vomiting. No other complaints.  No family at bedside currently  OBJECTIVE:  Vitals:   11/20/23 0522 11/20/23 0814  BP: (!) 145/73 139/64  Pulse: 70 82  Resp: 17 18  Temp: 97.9 F (36.6 C) 98.5 F (36.9 C)  SpO2: 96% 95%    General: Sitting up on edge of bed, no acute distress.  Pleasant and cooperative Respiratory: No increased work of breathing.  Left lower extremity: Dressing clean, dry, intact.  Cam boot in place.  Soreness about the knee as expected.  Nontender to the thigh.  Able to wiggle toes.  Dorsal sensation light touch of the toes.  Toes warm well-perfused.  IMAGING: Stable post op imaging.   LABS:  Results for orders placed or performed during the hospital encounter of 11/17/23 (from the past 24 hours)  VITAMIN D 25 Hydroxy (Vit-D Deficiency, Fractures)     Status: None   Collection Time: 11/19/23 12:01 PM  Result Value Ref Range   Vit D, 25-Hydroxy 31.19 30 - 100 ng/mL  CBC     Status: Abnormal   Collection Time: 11/20/23  8:11 AM  Result Value Ref Range   WBC 9.7 4.0 - 10.5 K/uL   RBC 3.76 (L) 4.22 - 5.81 MIL/uL   Hemoglobin 11.7 (L) 13.0 - 17.0 g/dL   HCT 52.8 (L) 41.3 - 24.4 %   MCV 91.2 80.0 - 100.0 fL   MCH 31.1 26.0 - 34.0 pg   MCHC 34.1 30.0 - 36.0 g/dL   RDW 01.0 27.2 - 53.6 %   Platelets 186 150 - 400 K/uL   nRBC 0.0 0.0 - 0.2 %    ASSESSMENT: George Doyle is a 73 y.o. male, 1 Day Post-Op s/p INTRAMEDULLARY NAIL LEFT TIBIA  CV/Blood loss: Acute blood loss anemia, Hgb 11.7 this morning. Hemodynamically stable  PLAN: Weightbearing: WBAT LLE in CAM boot ROM: Okay for ankle ROM at rest Incisional and dressing care: Reinforce dressings as needed  Showering: Okay to begin  showering getting incisions wet 11/22/2023 Orthopedic device(s): CAM boot LLE Pain management:  1. Tylenol 650 mg q 6 hours scheduled 2. Robaxin 500 mg q 8 hours PRN 3. Oxycodone 5 mg q 4 hours PRN 4. Dilaudid 0.5-1 mg q 4 hours PRN VTE prophylaxis: Aspirin and Plavix , SCDs ID:  Ancef 2gm post op Foley/Lines:  No foley, KVO IVFs Impediments to Fracture Healing: Vitamin D level 31, start on daily supplementation. Dispo: PT/OT evaluation ongoing.  Currently recommending home health therapies.  Okay for discharge from ortho standpoint once cleared by medicine team and therapies.  Have sent discharge Rx for pain medication, vitamin D supplementation, and muscle relaxer to pharmacy on file  D/C recommendations: -Oxycodone and Robaxin for pain control -Continue home dose aspirin and Plavix for DVT prophylaxis -Continue 1000 units Vit D supplementation daily x 90 days  Follow - up plan: 2 weeks after discharge for wound check and repeat x-rays   Contact information:  Truitt Merle MD, Thyra Breed PA-C. After hours and holidays please check [email or web address] for group call information for [facility]   Thompson Caul, PA-C [phone] (office) [email or web address]

## 2023-11-20 NOTE — Plan of Care (Signed)

## 2023-11-20 NOTE — Progress Notes (Addendum)
 [facility] PROGRESS NOTE    Progress Note  George Doyle  ZOX:[id number] DOB: 1951/07/31 DOA: 11/17/2023 PCP: George Limerick, MD     Brief Narrative:   George Doyle is an 73 y.o. male past medical history of essential hypertension, PAD CAD subdural hematoma in 2019 who fell at the [facility] the day prior to admission CT of the tibia and fibula showed acute comminuted undisplaced distal shaft fracture of the tibia and acute shaft fibular fracture    Assessment/Plan:   Acute comminuted undisplaced mid tibial shaft tibia/fibula fracture: Orthopedic surgery was consulted recommended intramedullary nailing of the left tibia done on /11/2023. Continue narcotics for analgesics. PT OT has been consulted, will need home health PT. Weightbearing WBAT lower extremity in a cam boot  Patient may shower on 11/22/2023. Continue narcotics per orthopedics. VTE prophylaxis aspirin and Plavix per orthopedics. Has not had a bowel movement continue MiraLAX p.o. twice daily  CAD status post PCI: No anginal symptoms overnight. Resume prior and Plavix.  History of CVA: Resume aspirin and Plavix continue Lipitor. Has a Zio patch in place.  Essential hypertension: Resume antihypertensive medication postsurgical procedure   Seizure disorder (HCC) He denies any evidence of seizures continue Lamictal.  Mood disorder: Continue citalopram.  Hypovolemic hyponatremia: On oral hydration.   DVT prophylaxis: lovenox Family Communication:none Status is: Inpatient Remains inpatient appropriate because: Acute comminuted nondisplaced mid tibial shaft fracture    Code Status:     Code Status Orders  (From admission, onward)           Start     Ordered   11/18/23 0100  Do not attempt resuscitation (DNR) Pre-Arrest Interventions Desired  Continuous       Question Answer Comment  If pulseless and not breathing No CPR or chest compressions.   In Pre-Arrest  Conditions (Patient Has Pulse and Is Breathing) May intubate, use advanced airway interventions and cardioversion/ACLS medications if appropriate or indicated. May transfer to ICU.   Consent: Discussion documented in EHR or advanced directives reviewed      11/18/23 0100           Code Status History     Date Active Date Inactive Code Status Order ID Comments User Context   11/18/2023 0010 11/18/2023 0100 Do not attempt resuscitation (DNR) PRE-ARREST INTERVENTIONS DESIRED [id number]  George Giovanni, MD ED   07/18/2018 1234 07/18/2018 1644 Full Code [id number]  Christena Flake, MD Inpatient   07/20/2017 1937 07/24/2017 1424 Full Code [id number]  Delfino Lovett, MD Inpatient      Advance Directive Documentation    Flowsheet Row Most Recent Value  Type of Advance Directive Healthcare Power of Attorney, Living will  Pre-existing out of facility DNR order (yellow form or pink MOST form) --  "MOST" Form in Place? --         IV Access:   Peripheral IV   Procedures and diagnostic studies:   DG Tibia/Fibula Left Port Result Date: 11/19/2023 CLINICAL DATA:  Follow up fracture. EXAM: PORTABLE LEFT TIBIA AND FIBULA - 2 VIEW COMPARISON:  Preop x-ray 11/17/2023 FINDINGS: Interval placement image medullary rod with 2 proximal and 2 distal fixation screws transfixing the distal shaft tibial fracture. Separate proximal fibular shaft fracture. Imaging was obtained to aid in treatment. Preserved bone mineralization. No additional fracture or dislocation. Soft tissue gas noted. Imaging was obtained to aid in treatment. Vascular calcifications are noted. IMPRESSION: Status post ORIF with intramedullary rod fixation of the distal tibial fracture. Separate proximal  fibular shaft fracture. Electronically Signed   By: Karen Kays M.D.   On: 11/19/2023 14:35   DG Tibia/Fibula Left Result Date: 11/19/2023 CLINICAL DATA:  Elective surgery. EXAM: LEFT TIBIA AND FIBULA - 2 VIEW COMPARISON:  Preoperative imaging  FINDINGS: Eight fluoroscopic spot views of the left lower leg submitted from the operating room. Tibial intramedullary nail with proximal and distal locking screws traverse tibial shaft fracture. Proximal fibular fracture is grossly unchanged in alignment. Fluoroscopy time 1 minutes 16 seconds. Dose 2.63 mGy. IMPRESSION: Procedural fluoroscopy during tibial fracture fixation. Electronically Signed   By: Narda Rutherford M.D.   On: 11/19/2023 10:01   DG C-Arm 1-60 Min-No Report Result Date: 11/19/2023 Fluoroscopy was utilized by the requesting physician.  No radiographic interpretation.     Medical Consultants:   None.   Subjective:    George Doyle pain is show has not had a bowel movement.  Objective:    Vitals:   11/19/23 2011 11/20/23 0016 11/20/23 0522 11/20/23 0814  BP: (!) 153/63 135/68 (!) 145/73 139/64  Pulse: 76 74 70 82  Resp: 17 17 17 18   Temp: 97.9 F (36.6 C) 99.2 F (37.3 C) 97.9 F (36.6 C) 98.5 F (36.9 C)  TempSrc: Oral  Oral Oral  SpO2: 100% 97% 96% 95%  Weight:      Height:       SpO2: 95 % O2 Flow Rate (L/min): 2 L/min   Intake/Output Summary (Last 24 hours) at 11/20/2023 0955 Last data filed at 11/20/2023 0500 Gross per 24 hour  Intake --  Output 400 ml  Net -400 ml   Filed Weights   11/19/23 0651  Weight: 93.4 kg    Exam: General exam: In no acute distress. Respiratory system: Good air movement and clear to auscultation. Cardiovascular system: S1 & S2 heard, RRR. No JVD. Gastrointestinal system: Abdomen is nondistended, soft and nontender.  Extremities: No pedal edema. Skin: No rashes, lesions or ulcers Psychiatry: Judgement and insight appear normal. Mood & affect appropriate.  Data Reviewed:    Labs: Basic Metabolic Panel: Recent Labs  Lab 11/17/23 2144 11/18/23 0511 11/20/23 0811  NA 135 134* 134*  K 4.2 4.0 3.9  CL 102 104 100  CO2 26 23 24   GLUCOSE 104* 96 117*  BUN 24* 24* 13  CREATININE 1.30* 1.06 1.01   CALCIUM 9.4 8.7* 8.8*   GFR Estimated Creatinine Clearance: 72.4 mL/min (by C-G formula based on SCr of 1.01 mg/dL). Liver Function Tests: Recent Labs  Lab 11/18/23 0511  AST 19  ALT 13  ALKPHOS 62  BILITOT 0.8  PROT 6.0*  ALBUMIN 3.5   No results for input(s): "LIPASE", "AMYLASE" in the last 168 hours. No results for input(s): "AMMONIA" in the last 168 hours. Coagulation profile No results for input(s): "INR", "PROTIME" in the last 168 hours. COVID-19 Labs  No results for input(s): "DDIMER", "FERRITIN", "LDH", "CRP" in the last 72 hours.  No results found for: "SARSCOV2NAA"  CBC: Recent Labs  Lab 11/17/23 2144 11/18/23 0511 11/20/23 0811  WBC 11.2* 9.4 9.7  HGB 12.8* 12.0* 11.7*  HCT 38.5* 37.0* 34.3*  MCV 93.2 94.4 91.2  PLT 210 192 186   Cardiac Enzymes: No results for input(s): "CKTOTAL", "CKMB", "CKMBINDEX", "TROPONINI" in the last 168 hours. BNP (last 3 results) No results for input(s): "PROBNP" in the last 8760 hours. CBG: No results for input(s): "GLUCAP" in the last 168 hours. D-Dimer: No results for input(s): "DDIMER" in the last 72 hours. Hgb  A1c: No results for input(s): "HGBA1C" in the last 72 hours. Lipid Profile: No results for input(s): "CHOL", "HDL", "LDLCALC", "TRIG", "CHOLHDL", "LDLDIRECT" in the last 72 hours. Thyroid function studies: No results for input(s): "TSH", "T4TOTAL", "T3FREE", "THYROIDAB" in the last 72 hours.  Invalid input(s): "FREET3" Anemia work up: No results for input(s): "VITAMINB12", "FOLATE", "FERRITIN", "TIBC", "IRON", "RETICCTPCT" in the last 72 hours. Sepsis Labs: Recent Labs  Lab 11/17/23 2144 11/18/23 0511 11/20/23 0811  WBC 11.2* 9.4 9.7   Microbiology Recent Results (from the past 240 hours)  Surgical pcr screen     Status: None   Collection Time: 11/19/23 12:03 AM   Specimen: Nasal Mucosa; Nasal Swab  Result Value Ref Range Status   MRSA, PCR NEGATIVE NEGATIVE Final   Staphylococcus aureus NEGATIVE  NEGATIVE Final    Comment: (NOTE) The Xpert SA Assay (FDA approved for NASAL specimens in patients 46 years of age and older), is one component of a comprehensive surveillance program. It is not intended to diagnose infection nor to guide or monitor treatment. Performed at [facility], [address] [place], [place] [address]      Medications:    acetaminophen  650 mg Oral Q6H   Or   acetaminophen  650 mg Rectal Q6H   aspirin EC  81 mg Oral Daily   atorvastatin  80 mg Oral Daily   carvedilol  3.125 mg Oral BID   cholecalciferol  1,000 Units Oral Daily   citalopram  20 mg Oral QHS   docusate sodium  100 mg Oral BID   donepezil  5 mg Oral QHS   lamoTRIgine  200 mg Oral BID   losartan  50 mg Oral BID   polyethylene glycol  17 g Oral BID   Continuous Infusions:  0.9 % NaCl with KCl 20 mEq / L 40 mL/hr at 11/19/23 1228      LOS: 3 days   Marinda Elk  [facility]  11/20/2023, 9:55 AM

## 2023-11-20 NOTE — Progress Notes (Signed)
 Physical Therapy Treatment Patient Details Name: George Doyle MRN: [id number] DOB: [birth year] Today's Date: 11/20/2023   History of Present Illness George Doyle is an 73 y.o. male who fell at the [facility] the day prior to admission CT of the tibia and fibula showed acute comminuted undisplaced distal shaft fracture of the tibia and acute shaft fibular fracture. Pt is now s/p IM nailing of L tibia. Past medical history of essential hypertension, PAD CAD subdural hematoma in 2019.    PT Comments  Pt received in supine, agreeable to therapy session with emphasis on transfer, gait and stair negotiation, and with good participation and moderate c/o LLE pain. Pt needing up to minA for bed mobility but once given gait belt for personal use and instructed on using it as a leg lifter, pt progressed to CGA for bed mobility, transfers, gait and stair negotiation with RW support. Distance limited to shorter household distance due to pt c/o L knee pain. Pt continues to benefit from PT services to progress toward functional mobility goals, continue to recommend HHPT at this time.     If plan is discharge home, recommend the following: A little help with walking and/or transfers;A little help with bathing/dressing/bathroom;Assistance with cooking/housework;Direct supervision/assist for medications management;Assist for transportation;Help with stairs or ramp for entrance   Can travel by private vehicle        Equipment Recommendations  None recommended by PT    Recommendations for Other Services       Precautions / Restrictions Precautions Precautions: Fall Recall of Precautions/Restrictions: Intact Required Braces or Orthoses: Other Brace Other Brace: CAM Walker Restrictions Weight Bearing Restrictions Per Provider Order: Yes LLE Weight Bearing Per Provider Order: Weight bearing as tolerated     Mobility  Bed Mobility Overal bed mobility: Needs Assistance Bed Mobility:  Supine to Sit, Sit to Supine     Supine to sit: Min assist Sit to supine: Contact guard assist   General bed mobility comments: given minA for LLE mgmt to sit up but with gait belt as leg lifter, pt only needing CGA for return to supine    Transfers Overall transfer level: Needs assistance Equipment used: Rolling walker (2 wheels) Transfers: Sit to/from Stand Sit to Stand: Contact guard assist           General transfer comment: Cue for hand placement, increased time to perform.    Ambulation/Gait Ambulation/Gait assistance: Contact guard assist Gait Distance (Feet): 60 Feet Assistive device: Rolling walker (2 wheels) Gait Pattern/deviations: Antalgic, Decreased stride length, Step-to pattern Gait velocity: decreased     General Gait Details: no LOB noted. Antalgic step to pattern with pt c/o L knee pain in addition to his L ankle pain   Stairs Stairs: Yes Stairs assistance: Contact guard assist Stair Management: Step to pattern, Forwards, With walker Number of Stairs: 2 General stair comments: single 7" platform step in room x2 reps, pt able to verbalize sequencing pattern prior to performing, RW for support; no buckling or LOB   Wheelchair Mobility     Tilt Bed    Modified Rankin (Stroke Patients Only)       Balance Overall balance assessment: Needs assistance Sitting-balance support: Bilateral upper extremity supported Sitting balance-Leahy Scale: Good     Standing balance support: Bilateral upper extremity supported, During functional activity Standing balance-Leahy Scale: Fair Standing balance comment: RW  Communication Communication Communication: No apparent difficulties  Cognition Arousal: Alert Behavior During Therapy: WFL for tasks assessed/performed   PT - Cognitive impairments: No apparent impairments                         Following commands: Intact      Cueing Cueing Techniques:  Verbal cues, Tactile cues  Exercises      General Comments        Pertinent Vitals/Pain Pain Assessment Pain Assessment: Faces Faces Pain Scale: Hurts even more Pain Location: L knee and foot Pain Descriptors / Indicators: Grimacing, Discomfort Pain Intervention(s): Limited activity within patient's tolerance, Monitored during session, Repositioned, Patient requesting pain meds-RN notified (pt defers ice at this time)    Home Living                          Prior Function            PT Goals (current goals can now be found in the care plan section) Acute Rehab PT Goals Patient Stated Goal: to go home PT Goal Formulation: With patient Time For Goal Achievement: 12/03/23 Progress towards PT goals: Progressing toward goals    Frequency    Min 1X/week      PT Plan      Co-evaluation              AM-PAC PT "6 Clicks" Mobility   Outcome Measure  Help needed turning from your back to your side while in a flat bed without using bedrails?: A Little Help needed moving from lying on your back to sitting on the side of a flat bed without using bedrails?: A Little Help needed moving to and from a bed to a chair (including a wheelchair)?: A Little Help needed standing up from a chair using your arms (e.g., wheelchair or bedside chair)?: A Little Help needed to walk in hospital room?: A Little Help needed climbing 3-5 steps with a railing? : A Little 6 Click Score: 18    End of Session Equipment Utilized During Treatment: Gait belt Activity Tolerance: Patient tolerated treatment well;Patient limited by pain Patient left: with call bell/phone within reach;in bed;with bed alarm set;with family/visitor present;with nursing/sitter in room Nurse Communication: Mobility status;Patient requests pain meds PT Visit Diagnosis: Other abnormalities of gait and mobility (R26.89)     Time: 1324-4010 PT Time Calculation (min) (ACUTE ONLY): 23 min  Charges:    $Gait  Training: 8-22 mins $Therapeutic Activity: 8-22 mins PT General Charges $$ ACUTE PT VISIT: 1 Visit                     Eman Rynders P., PTA Acute Rehabilitation Services Secure Chat Preferred 9a-5:30pm Office: [phone]    Dorathy Kinsman [facility] 11/20/2023, 6:25 PM

## 2023-11-21 DIAGNOSIS — S82832A Other fracture of upper and lower end of left fibula, initial encounter for closed fracture: Secondary | ICD-10-CM | POA: Diagnosis not present

## 2023-11-21 DIAGNOSIS — I10 Essential (primary) hypertension: Secondary | ICD-10-CM | POA: Diagnosis not present

## 2023-11-21 DIAGNOSIS — S82202A Unspecified fracture of shaft of left tibia, initial encounter for closed fracture: Secondary | ICD-10-CM | POA: Diagnosis not present

## 2023-11-21 DIAGNOSIS — S82402A Unspecified fracture of shaft of left fibula, initial encounter for closed fracture: Secondary | ICD-10-CM | POA: Diagnosis not present

## 2023-11-21 LAB — CBC
HCT: 31.1 % — ABNORMAL LOW (ref 39.0–52.0)
Hemoglobin: 10.5 g/dL — ABNORMAL LOW (ref 13.0–17.0)
MCH: 30.5 pg (ref 26.0–34.0)
MCHC: 33.8 g/dL (ref 30.0–36.0)
MCV: 90.4 fL (ref 80.0–100.0)
Platelets: 175 10*3/uL (ref 150–400)
RBC: 3.44 MIL/uL — ABNORMAL LOW (ref 4.22–5.81)
RDW: 13.1 % (ref 11.5–15.5)
WBC: 9.7 10*3/uL (ref 4.0–10.5)
nRBC: 0 % (ref 0.0–0.2)

## 2023-11-21 MED ORDER — POLYETHYLENE GLYCOL 3350 17 G PO PACK: 17.0000 g | PACK | Freq: Every day | ORAL | 0 refills | Status: AC

## 2023-11-21 NOTE — Progress Notes (Signed)
 Occupational Therapy Treatment Patient Details Name: George Doyle MRN: [id number] DOB: 1951-06-20 Today's Date: 11/21/2023   History of present illness George Doyle is an 73 y.o. male who fell at the [facility] the day prior to admission CT of the tibia and fibula showed acute comminuted undisplaced distal shaft fracture of the tibia and acute shaft fibular fracture. Pt is now s/p IM nailing of L tibia. Past medical history of essential hypertension, PAD CAD subdural hematoma in 2019.   OT comments  Upon entry pt was in bathroom on bsc. Simulated shower transfer in room with s for safety. Educated on compensatory strategies for ADLs. Pt progressing well towards goals. No further acute OT needs. Recommendation of HHOT to optimize independence levels and return to PLOF.        If plan is discharge home, recommend the following:  A little help with walking and/or transfers;A little help with bathing/dressing/bathroom   Equipment Recommendations  None recommended by OT    Recommendations for Other Services      Precautions / Restrictions Precautions Precautions: Fall Recall of Precautions/Restrictions: Intact Required Braces or Orthoses: Other Brace Other Brace: CAM Walker Restrictions Weight Bearing Restrictions Per Provider Order: Yes LLE Weight Bearing Per Provider Order: Weight bearing as tolerated       Mobility Bed Mobility               General bed mobility comments: In bathroom upon entry    Transfers Overall transfer level: Needs assistance Equipment used: Rolling walker (2 wheels) Transfers: Sit to/from Stand Sit to Stand: Supervision                 Balance Overall balance assessment: Needs assistance Sitting-balance support: No upper extremity supported, Feet supported Sitting balance-Leahy Scale: Good     Standing balance support: Bilateral upper extremity supported, During functional activity, Reliant on assistive device  for balance Standing balance-Leahy Scale: Fair Standing balance comment: RW, able to wash hands, and simulate LB dressing without loss of balance                           ADL either performed or assessed with clinical judgement   ADL Overall ADL's : Needs assistance/impaired     Grooming: Wash/dry hands;Modified independent;Standing               Lower Body Dressing: Sit to/from stand;Supervision/safety Lower Body Dressing Details (indicate cue type and reason): Simulated in room Toilet Transfer: Supervision/safety;Ambulation;Rolling walker (2 wheels);BSC/3in1       Tub/ Shower Transfer: Walk-in shower;Supervision/safety;Rolling walker (2 wheels) Tub/Shower Transfer Details (indicate cue type and reason): Simulated in room s for safety Functional mobility during ADLs: Supervision/safety;Rolling walker (2 wheels)      Extremity/Trunk Assessment Upper Extremity Assessment Upper Extremity Assessment: Overall WFL for tasks assessed   Lower Extremity Assessment Lower Extremity Assessment: Defer to PT evaluation LLE Deficits / Details: L tib/fib fx        Vision   Vision Assessment?: No apparent visual deficits   Perception     Praxis     Communication Communication Communication: No apparent difficulties   Cognition Arousal: Alert Behavior During Therapy: WFL for tasks assessed/performed Cognition: No apparent impairments                               Following commands: Intact        Cueing  Cueing Techniques: Verbal cues, Tactile cues  Exercises      Shoulder Instructions       General Comments      Pertinent Vitals/ Pain       Pain Assessment Pain Assessment: Faces Faces Pain Scale: Hurts a little bit Pain Location: L knee and foot Pain Descriptors / Indicators: Grimacing, Discomfort Pain Intervention(s): Monitored during session  Home Living Family/patient expects to be discharged to:: Private residence Living  Arrangements: Spouse/significant other Available Help at Discharge: Family;Available PRN/intermittently Type of Home: House Home Access: Stairs to enter Entrance Stairs-Number of Steps: 1   Home Layout: One level     Bathroom Shower/Tub: Walk-in shower;Door   [employer] Toilet: Standard Bathroom Accessibility: Yes How Accessible: Accessible via walker Home Equipment: Agricultural consultant (2 wheels);Crutches;Cane - single point;BSC/3in1;Shower seat;Grab bars - tub/shower;Hand held shower head          Prior Functioning/Environment              Frequency  Min 1X/week        Progress Toward Goals  OT Goals(current goals can now be found in the care plan section)  Progress towards OT goals: Progressing toward goals  Acute Rehab OT Goals Patient Stated Goal: To go home OT Goal Formulation: With patient Time For Goal Achievement: 12/03/23 Potential to Achieve Goals: Good ADL Goals Pt Will Perform Grooming: with modified independence;sitting;standing Pt Will Perform Lower Body Dressing: with modified independence;with adaptive equipment;sit to/from stand Pt Will Transfer to Toilet: with modified independence;ambulating;bedside commode Pt Will Perform Tub/Shower Transfer: Shower transfer;with modified independence;ambulating;shower seat  Plan      Co-evaluation                 AM-PAC OT "6 Clicks" Daily Activity     Outcome Measure   Help from another person eating meals?: None Help from another person taking care of personal grooming?: None Help from another person toileting, which includes using toliet, bedpan, or urinal?: A Little Help from another person bathing (including washing, rinsing, drying)?: A Little Help from another person to put on and taking off regular upper body clothing?: A Little Help from another person to put on and taking off regular lower body clothing?: A Little 6 Click Score: 20    End of Session Equipment Utilized During Treatment:  Rolling walker (2 wheels);Other (comment) (CAM Walker)  OT Visit Diagnosis: Unsteadiness on feet (R26.81);Other abnormalities of gait and mobility (R26.89)   Activity Tolerance Patient tolerated treatment well   Patient Left in chair;with call bell/phone within reach;with family/visitor present   Nurse Communication Mobility status        Time: 4098-1191 OT Time Calculation (min): 21 min  Charges: OT General Charges $OT Visit: 1 Visit OT Treatments $Self Care/Home Management : 8-22 mins  Ivor Messier, OT  Acute Rehabilitation Services Office [phone] Secure chat preferred   Marilynne Drivers 11/21/2023, 1:52 PM

## 2023-11-21 NOTE — Plan of Care (Signed)

## 2023-11-21 NOTE — TOC Transition Note (Signed)
 Transition of Care [facility]) - Discharge Note   Patient Details  Name: George Doyle MRN: [id number] Date of Birth: March 02, 1951  [facility] CM/SW Contact:  Epifanio Lesches, RN Phone Number: 11/21/2023, 10:17 AM   Clinical Narrative:    Patient will DC to: home Anticipated DC date: 11/21/2023 Family notified: yes Transport by: car      - s/p IM nailing of L tibia  Per MD patient ready for DC today . RN, patient,  and patient's  wife notified of DC. Order noted for home health services, PT/;OT. Pt agreeable. Pt without provider preference. Referral made with [facility] and accepted. Pt without DME needs.  Pt without RX med concerns and transportation issues. Wife to provide transportation to home. Post hospital f/u noted on AVS  RNCM will sign off for now as intervention is no longer needed. Please consult [place] again if new needs arise.    Final next level of care: Home w Home Health Services Barriers to Discharge: No Barriers Identified   Patient Goals and CMS Choice     Choice offered to / list presented to : Patient      Discharge Placement                       Discharge Plan and Services Additional resources added to the After Visit Summary for                            [facility] Arranged: PT, OT [facility] Agency: [facility] Date [facility] Agency Contacted: 11/21/23 Time HH Agency Contacted: 1017 Representative spoke with at [facility] Agency: Amy  Social Drivers of Health (SDOH) Interventions SDOH Screenings   Food Insecurity: No Food Insecurity (11/18/2023)  Housing: Low Risk  (11/18/2023)  Transportation Needs: No Transportation Needs (11/18/2023)  Utilities: Not At Risk (11/18/2023)  Alcohol Screen: Low Risk  (11/15/2022)  Depression (PHQ2-9): Low Risk  (08/30/2023)  Financial Resource Strain: Low Risk  (11/15/2022)  Physical Activity: Sufficiently Active (11/15/2022)  Social Connections: Moderately Isolated (11/18/2023)  Stress: No Stress Concern  Present (11/15/2022)  Tobacco Use: Medium Risk (11/19/2023)     Readmission Risk Interventions     No data to display

## 2023-11-21 NOTE — Care Management Important Message (Signed)
 Important Message  Patient Details  Name: George Doyle MRN: [id number] Date of Birth: 1950-12-28   Important Message Given:  Yes - Medicare IM     Sherilyn Banker 11/21/2023, 12:46 PM

## 2023-11-21 NOTE — Discharge Summary (Signed)
 Physician Discharge Summary  George Doyle ZOX:[id number] DOB: 1951/03/06 DOA: 11/17/2023  PCP: Duanne Limerick, MD  Admit date: 11/17/2023 Discharge date: 11/21/2023  Admitted From: Home Disposition:  Home  Recommendations for Outpatient Follow-up:  Follow up with ortho in 1-2 weeks Please obtain BMP/CBC in one week   Home Health:No Equipment/Devices:none  Discharge Condition:Stable CODE STATUS:Full Diet recommendation: Heart Healthy  Brief/Interim Summary: 73 y.o. male past medical history of essential hypertension, PAD CAD subdural hematoma in 2019 who fell at the [facility] the day prior to admission CT of the tibia and fibula showed acute comminuted undisplaced distal shaft fracture of the tibia and acute shaft fibular fracture   Discharge Diagnoses:  Principal Problem:   Tibia/fibula fracture Active Problems:   Essential hypertension   Hyperlipidemia   CAD (coronary artery disease), native coronary artery   Seizure disorder (HCC)   PAD (peripheral artery disease) (HCC)   Hip fracture (HCC)  Acute comminuted undisplaced mid tibial and fibular fracture: Orthopedic surgery was consulted who recommended intramedullary nailing of the tibia on 11/19/2023. Pain was controlled with Robaxin. PT evaluated the patient recommended home health PT He will go home on Robaxin aspirin and Plavix for DVT prophylaxis. MiraLAX daily.  CAD status post PCI: Continue aspirin for DVT prophylaxis per surgery. According to the patient he is no longer on Plavix.  History of CVA: Continue Lipitor and aspirin.  Essential hypertension: Resume home regimen.  History of seizure disorder: Continue current home regimen.  Mood disorder: Continue citalopram.    Discharge Instructions  Discharge Instructions     Diet - low sodium heart healthy   Complete by: As directed    Increase activity slowly   Complete by: As directed       Allergies as of 11/21/2023   No Known  Allergies      Medication List     TAKE these medications    acetaminophen 500 MG tablet Commonly known as: TYLENOL Take 2 tablets (1,000 mg total) by mouth every 6 (six) hours as needed for mild pain.   albuterol 108 (90 Base) MCG/ACT inhaler Commonly known as: VENTOLIN HFA Inhale 2 puffs into the lungs every 6 (six) hours as needed for wheezing or shortness of breath.   aspirin EC 81 MG tablet Take 1 tablet (81 mg total) daily by mouth.   atorvastatin 80 MG tablet Commonly known as: LIPITOR TAKE 1 TABLET(80 MG) BY MOUTH DAILY AT 6 PM   carvedilol 3.125 MG tablet Commonly known as: COREG Take 1 tablet (3.125 mg total) by mouth 2 (two) times daily.   citalopram 20 MG tablet Commonly known as: CELEXA Take 20 mg by mouth at bedtime.   clopidogrel 75 MG tablet Commonly known as: PLAVIX Take 75 mg once daily for 21 days and then discontinue.  Monitor for signs of bleeding or bruising.   donepezil 5 MG tablet Commonly known as: ARICEPT Take 5 mg by mouth at bedtime.   EPINEPHrine 0.3 mg/0.3 mL Soaj injection Commonly known as: EPI-PEN Inject 0.3 mg into the muscle as needed for anaphylaxis.   fexofenadine 180 MG tablet Commonly known as: ALLEGRA Take 180 mg by mouth as needed for allergies or rhinitis. otc   lamoTRIgine 200 MG tablet Commonly known as: LAMICTAL Take 200 mg by mouth 2 (two) times daily.   losartan 50 MG tablet Commonly known as: Cozaar Take 1 tablet (50 mg total) by mouth 2 (two) times daily.   methocarbamol 500 MG tablet Commonly known as: ROBAXIN  Take 1 tablet (500 mg total) by mouth every 8 (eight) hours as needed for muscle spasms.   nitroGLYCERIN 0.4 MG SL tablet Commonly known as: NITROSTAT PLACE 1 TABLET UNDER THE TONGUE EVERY 5 MINUTES FOR 3 DOSES AS NEEDED FOR CHEST PAIN   oxyCODONE 5 MG immediate release tablet Commonly known as: Oxy IR/ROXICODONE Take 1 tablet (5 mg total) by mouth every 4 (four) hours as needed for severe pain  (pain score 7-10).   polyethylene glycol 17 g packet Commonly known as: MIRALAX / GLYCOLAX Take 17 g by mouth daily for 5 days.   spironolactone 25 MG tablet Commonly known as: ALDACTONE Take 1 tablet (25 mg total) by mouth daily.   vitamin D3 25 MCG tablet Commonly known as: CHOLECALCIFEROL Take 1 tablet (1,000 Units total) by mouth daily.        Follow-up Information     Haddix, Gillie Manners, MD. Schedule an appointment as soon as possible for a visit in 2 week(s).   Specialty: Orthopedic Surgery Why: for wound check and repeat x-rays Contact information: [address] [place] [address] [phone]                No Known Allergies  Consultations: Orthopedic surgery   Procedures/Studies: DG Tibia/Fibula Left Port Result Date: 11/19/2023 CLINICAL DATA:  Follow up fracture. EXAM: PORTABLE LEFT TIBIA AND FIBULA - 2 VIEW COMPARISON:  Preop x-ray 11/17/2023 FINDINGS: Interval placement image medullary rod with 2 proximal and 2 distal fixation screws transfixing the distal shaft tibial fracture. Separate proximal fibular shaft fracture. Imaging was obtained to aid in treatment. Preserved bone mineralization. No additional fracture or dislocation. Soft tissue gas noted. Imaging was obtained to aid in treatment. Vascular calcifications are noted. IMPRESSION: Status post ORIF with intramedullary rod fixation of the distal tibial fracture. Separate proximal fibular shaft fracture. Electronically Signed   By: Karen Kays M.D.   On: 11/19/2023 14:35   DG Tibia/Fibula Left Result Date: 11/19/2023 CLINICAL DATA:  Elective surgery. EXAM: LEFT TIBIA AND FIBULA - 2 VIEW COMPARISON:  Preoperative imaging FINDINGS: Eight fluoroscopic spot views of the left lower leg submitted from the operating room. Tibial intramedullary nail with proximal and distal locking screws traverse tibial shaft fracture. Proximal fibular fracture is grossly unchanged in alignment. Fluoroscopy time 1 minutes  16 seconds. Dose 2.63 mGy. IMPRESSION: Procedural fluoroscopy during tibial fracture fixation. Electronically Signed   By: Narda Rutherford M.D.   On: 11/19/2023 10:01   DG C-Arm 1-60 Min-No Report Result Date: 11/19/2023 Fluoroscopy was utilized by the requesting physician.  No radiographic interpretation.   CT Tibia Fibula Left Wo Contrast Result Date: 11/18/2023 CLINICAL DATA:  Lower leg trauma spiral fracture - CT imaging of left tibia/fibula and left ankle requested by orthopedic surgeon; Ankle trauma, fracture, xray done (Age >= 5y) EXAM: CT OF THE LOWER LEFT EXTREMITY AND ANKLE WITHOUT CONTRAST TECHNIQUE: Multidetector CT imaging of the lower left extremity and ankle was performed according to the standard protocol. RADIATION DOSE REDUCTION: This exam was performed according to the departmental dose-optimization program which includes automated exposure control, adjustment of the mA and/or kV according to patient size and/or use of iterative reconstruction technique. COMPARISON:  X-ray left tibia fibula 11/17/2023 FINDINGS: Bones/Joint/Cartilage Acute comminuted undisplaced mid to distal tibial shaft fracture. Acute full shaft with displaced diagonal proximal fibular shaft fracture. No acute fracture of the bones of the ankle. No acute displaced fracture or dislocation of the visualized bones of the knee. No severe degenerative  changes. Ligaments Suboptimally assessed by CT. Muscles and Tendons Grossly unremarkable. Soft tissues Subcutaneus soft tissue edema.  Vascular calcification. IMPRESSION: 1. Acute comminuted undisplaced mid to distal tibial shaft fracture. 2. Acute full shaft with displaced diagonal proximal fibular shaft fracture. 3. No acute fracture of the bones of the ankle. Electronically Signed   By: Tish Frederickson M.D.   On: 11/18/2023 00:01   CT Ankle Left Wo Contrast Result Date: 11/18/2023 CLINICAL DATA:  Lower leg trauma spiral fracture - CT imaging of left tibia/fibula and left ankle  requested by orthopedic surgeon; Ankle trauma, fracture, xray done (Age >= 5y) EXAM: CT OF THE LOWER LEFT EXTREMITY AND ANKLE WITHOUT CONTRAST TECHNIQUE: Multidetector CT imaging of the lower left extremity and ankle was performed according to the standard protocol. RADIATION DOSE REDUCTION: This exam was performed according to the departmental dose-optimization program which includes automated exposure control, adjustment of the mA and/or kV according to patient size and/or use of iterative reconstruction technique. COMPARISON:  X-ray left tibia fibula 11/17/2023 FINDINGS: Bones/Joint/Cartilage Acute comminuted undisplaced mid to distal tibial shaft fracture. Acute full shaft with displaced diagonal proximal fibular shaft fracture. No acute fracture of the bones of the ankle. No acute displaced fracture or dislocation of the visualized bones of the knee. No severe degenerative changes. Ligaments Suboptimally assessed by CT. Muscles and Tendons Grossly unremarkable. Soft tissues Subcutaneus soft tissue edema.  Vascular calcification. IMPRESSION: 1. Acute comminuted undisplaced mid to distal tibial shaft fracture. 2. Acute full shaft with displaced diagonal proximal fibular shaft fracture. 3. No acute fracture of the bones of the ankle. Electronically Signed   By: Tish Frederickson M.D.   On: 11/18/2023 00:01   DG Tibia/Fibula Left Result Date: 11/17/2023 CLINICAL DATA:  Deformity after falling down steps.  Flank pain. EXAM: LEFT TIBIA AND FIBULA - 2 VIEW COMPARISON:  None Available. FINDINGS: Acute comminuted spiral fracture of the mid-distal left tibial diaphysis. Posterior displacement of the distal fragment 1/2 shaft width. Slight apex anterior angulation. Additional displaced oblique fracture of the proximal fibular diaphysis. Slight posterior displacement of the distal fragment with apex lateral angulation. IMPRESSION: 1. Acute comminuted spiral fracture of the mid-distal left tibial diaphysis. 2. Acute  displaced oblique fracture of the proximal fibular diaphysis. Electronically Signed   By: Minerva Fester M.D.   On: 11/17/2023 21:58     Subjective: No complaints  Discharge Exam: Vitals:   11/21/23 0438 11/21/23 0804  BP: (!) 144/70 (!) 166/80  Pulse: 74   Resp: 16 17  Temp: 98.6 F (37 C) 98.5 F (36.9 C)  SpO2: 94% (!) 86%   Vitals:   11/20/23 1446 11/20/23 2016 11/21/23 0438 11/21/23 0804  BP: (!) 112/55 136/69 (!) 144/70 (!) 166/80  Pulse: 68 72 74   Resp: 18 16 16 17   Temp: 97.8 F (36.6 C) 98.4 F (36.9 C) 98.6 F (37 C) 98.5 F (36.9 C)  TempSrc:  Oral    SpO2: 95% 92% 94% (!) 86%  Weight:      Height:        General: Pt is alert, awake, not in acute distress Cardiovascular: RRR, S1/S2 +, no rubs, no gallops Respiratory: CTA bilaterally, no wheezing, no rhonchi Abdominal: Soft, NT, ND, bowel sounds + Extremities: no edema, no cyanosis    The results of significant diagnostics from this hospitalization (including imaging, microbiology, ancillary and laboratory) are listed below for reference.     Microbiology: Recent Results (from the past 240 hours)  Surgical pcr screen  Status: None   Collection Time: 11/19/23 12:03 AM   Specimen: Nasal Mucosa; Nasal Swab  Result Value Ref Range Status   MRSA, PCR NEGATIVE NEGATIVE Final   Staphylococcus aureus NEGATIVE NEGATIVE Final    Comment: (NOTE) The Xpert SA Assay (FDA approved for NASAL specimens in patients 55 years of age and older), is one component of a comprehensive surveillance program. It is not intended to diagnose infection nor to guide or monitor treatment. Performed at [facility], [address], [place], [place] [address]      Labs: BNP (last 3 results) No results for input(s): "BNP" in the last 8760 hours. Basic Metabolic Panel: Recent Labs  Lab 11/17/23 2144 11/18/23 0511 11/20/23 0811  NA 135 134* 134*  K 4.2 4.0 3.9  CL 102 104 100  CO2 26 23 24   GLUCOSE 104* 96  117*  BUN 24* 24* 13  CREATININE 1.30* 1.06 1.01  CALCIUM 9.4 8.7* 8.8*   Liver Function Tests: Recent Labs  Lab 11/18/23 0511  AST 19  ALT 13  ALKPHOS 62  BILITOT 0.8  PROT 6.0*  ALBUMIN 3.5   No results for input(s): "LIPASE", "AMYLASE" in the last 168 hours. No results for input(s): "AMMONIA" in the last 168 hours. CBC: Recent Labs  Lab 11/17/23 2144 11/18/23 0511 11/20/23 0811 11/21/23 0614  WBC 11.2* 9.4 9.7 9.7  HGB 12.8* 12.0* 11.7* 10.5*  HCT 38.5* 37.0* 34.3* 31.1*  MCV 93.2 94.4 91.2 90.4  PLT 210 192 186 175   Cardiac Enzymes: No results for input(s): "CKTOTAL", "CKMB", "CKMBINDEX", "TROPONINI" in the last 168 hours. BNP: Invalid input(s): "POCBNP" CBG: No results for input(s): "GLUCAP" in the last 168 hours. D-Dimer No results for input(s): "DDIMER" in the last 72 hours. Hgb A1c No results for input(s): "HGBA1C" in the last 72 hours. Lipid Profile No results for input(s): "CHOL", "HDL", "LDLCALC", "TRIG", "CHOLHDL", "LDLDIRECT" in the last 72 hours. Thyroid function studies No results for input(s): "TSH", "T4TOTAL", "T3FREE", "THYROIDAB" in the last 72 hours.  Invalid input(s): "FREET3" Anemia work up No results for input(s): "VITAMINB12", "FOLATE", "FERRITIN", "TIBC", "IRON", "RETICCTPCT" in the last 72 hours. Urinalysis    Component Value Date/Time   COLORURINE YELLOW 08/09/2021 1056   APPEARANCEUR CLEAR 08/09/2021 1056   LABSPEC 1.020 08/09/2021 1056   PHURINE 5.5 08/09/2021 1056   GLUCOSEU NEGATIVE 08/09/2021 1056   HGBUR TRACE (A) 08/09/2021 1056   BILIRUBINUR NEGATIVE 08/09/2021 1056   BILIRUBINUR negative 10/12/2016 1004   KETONESUR NEGATIVE 08/09/2021 1056   PROTEINUR NEGATIVE 08/09/2021 1056   UROBILINOGEN 0.2 10/12/2016 1004   NITRITE NEGATIVE 08/09/2021 1056   LEUKOCYTESUR NEGATIVE 08/09/2021 1056   Sepsis Labs Recent Labs  Lab 11/17/23 2144 11/18/23 0511 11/20/23 0811 11/21/23 0614  WBC 11.2* 9.4 9.7 9.7    Microbiology Recent Results (from the past 240 hours)  Surgical pcr screen     Status: None   Collection Time: 11/19/23 12:03 AM   Specimen: Nasal Mucosa; Nasal Swab  Result Value Ref Range Status   MRSA, PCR NEGATIVE NEGATIVE Final   Staphylococcus aureus NEGATIVE NEGATIVE Final    Comment: (NOTE) The Xpert SA Assay (FDA approved for NASAL specimens in patients 73 years of age and older), is one component of a comprehensive surveillance program. It is not intended to diagnose infection nor to guide or monitor treatment. Performed at [facility], [address], [place], [place] [address]      Time coordinating discharge: Over 35  minutes  SIGNED:   Marinda Elk, MD  [facility] 11/21/2023, 8:51 AM Pager   If 7PM-7AM, please contact night-coverage [email or web address] Password [email or web address]

## 2023-11-21 NOTE — Progress Notes (Signed)
 Orthopaedic Trauma Progress Note  SUBJECTIVE: Doing well this morning.  Notes some soreness to the left leg but overall pain is well-controlled.  Has been able to mobilize to and from the bathroom multiple times with a walker, has done well with this.  Tolerating diet and fluids.  No chest pain. No SOB. No nausea/vomiting. No other complaints.  Wife at bedside.  Per wife's report, patient has been off Plavix since October 17, 2023.  Remains on aspirin 81 mg daily.  OBJECTIVE:  Vitals:   11/21/23 0438 11/21/23 0804  BP: (!) 144/70 (!) 166/80  Pulse: 74   Resp: 16 17  Temp: 98.6 F (37 C) 98.5 F (36.9 C)  SpO2: 94% (!) 86%    General: Sitting up in bedside chair, no acute distress.  Pleasant and cooperative Respiratory: No increased work of breathing.  Left lower extremity: Dressing removed, incisions are clean, dry, intact with Steri-Strips in place.  New dry dressing applied to the ankle and lower leg.  Incisions to the knee left open to air. Cam boot in place.  Soreness about the knee and throughout the calf as expected.  Nontender to the thigh.  Able to wiggle toes.  Endorses sensation to light touch over all aspects of the foot.  Ankle DF/PF intact.  Able to wiggle toes.  2+ DP pulse   IMAGING: Stable post op imaging.   LABS:  Results for orders placed or performed during the hospital encounter of 11/17/23 (from the past 24 hours)  CBC     Status: Abnormal   Collection Time: 11/21/23  6:14 AM  Result Value Ref Range   WBC 9.7 4.0 - 10.5 K/uL   RBC 3.44 (L) 4.22 - 5.81 MIL/uL   Hemoglobin 10.5 (L) 13.0 - 17.0 g/dL   HCT 16.1 (L) 09.6 - 04.5 %   MCV 90.4 80.0 - 100.0 fL   MCH 30.5 26.0 - 34.0 pg   MCHC 33.8 30.0 - 36.0 g/dL   RDW 40.9 81.1 - 91.4 %   Platelets 175 150 - 400 K/uL   nRBC 0.0 0.0 - 0.2 %    ASSESSMENT: George Doyle is a 73 y.o. male, 2 Days Post-Op s/p INTRAMEDULLARY NAIL LEFT TIBIA  CV/Blood loss: Acute blood loss anemia, Hgb 10.5 this morning.  Hemodynamically stable  PLAN: Weightbearing: WBAT LLE in CAM boot ROM: Okay for ankle ROM as tolerated Incisional and dressing care: Dressings changed today.  Continue to change dressing as needed.  Okay to leave incisions open to air if no drainage Showering: Okay to begin showering getting incisions wet 11/22/2023 Orthopedic device(s): CAM boot LLE when OOB Pain management:  1. Tylenol 650 mg q 6 hours scheduled 2. Robaxin 500 mg q 8 hours PRN 3. Oxycodone 5 mg q 4 hours PRN 4. Dilaudid 0.5-1 mg q 4 hours PRN VTE prophylaxis: Home dose aspirin, SCDs ID:  Ancef 2gm post op completed Foley/Lines:  No foley, KVO IVFs Impediments to Fracture Healing: Vitamin D level 31, started on daily supplementation. Dispo: PT/OT evaluation ongoing.  Currently recommending home health therapies.  Okay for discharge from ortho standpoint once cleared by medicine team and therapies.  Have sent discharge Rx for pain medication, vitamin D supplementation, and muscle relaxer to pharmacy on file  D/C recommendations: -Oxycodone and Robaxin for pain control -Continue home dose aspirin for DVT prophylaxis -Continue 1000 units Vit D supplementation daily x 90 days  Follow - up plan: 2 weeks after discharge for wound check and repeat  x-rays   Contact information:  Truitt Merle MD, Thyra Breed PA-C. After hours and holidays please check [email or web address] for group call information for [facility]   Thompson Caul, PA-C [phone] (office) [email or web address]

## 2023-12-02 DIAGNOSIS — I639 Cerebral infarction, unspecified: Secondary | ICD-10-CM

## 2023-12-06 ENCOUNTER — Ambulatory Visit: Payer: Medicare HMO | Admitting: Emergency Medicine

## 2023-12-06 VITALS — Ht 69.0 in | Wt 205.0 lb

## 2023-12-06 DIAGNOSIS — Z0000 Encounter for general adult medical examination without abnormal findings: Secondary | ICD-10-CM | POA: Diagnosis not present

## 2023-12-06 NOTE — Patient Instructions (Addendum)
 George Doyle , Thank you for taking time to come for your Medicare Wellness Visit. I appreciate your ongoing commitment to your health goals. Please review the following plan we discussed and let me know if I can assist you in the future.   Referrals/Orders/Follow-Ups/Clinician Recommendations: Follow up with Dr. Servando Snare regarding repeat colonoscopy once you have recovered from your broken leg.   This is a list of the screening recommended for you and due dates:  Health Maintenance  Topic Date Due   COVID-19 Vaccine (4 - 2024-25 season) 05/20/2023   Medicare Annual Wellness Visit  12/05/2024   DTaP/Tdap/Td vaccine (2 - Td or Tdap) 10/12/2026   Colon Cancer Screening  04/30/2028   Pneumonia Vaccine  Completed   Flu Shot  Completed   Hepatitis C Screening  Completed   Zoster (Shingles) Vaccine  Completed   HPV Vaccine  Aged Out    Advanced directives: (In Chart) A copy of your advanced directives are scanned into your chart should your provider ever need it.  Next Medicare Annual Wellness Visit scheduled for next year: Yes, 12/18/24 @ 3:50pm (phone visit)  Fall Prevention in the Home, Adult Falls can cause injuries and affect people of all ages. There are many simple things that you can do to make your home safe and to help prevent falls. If you need it, ask for help making these changes. What actions can I take to prevent falls? General information Use good lighting in all rooms. Make sure to: Replace any light bulbs that burn out. Turn on lights if it is dark and use night-lights. Keep items that you use often in easy-to-reach places. Lower the shelves around your home if needed. Move furniture so that there are clear paths around it. Do not keep throw rugs or other things on the floor that can make you trip. If any of your floors are uneven, fix them. Add color or contrast paint or tape to clearly mark and help you see: Grab bars or handrails. First and last steps of  staircases. Where the edge of each step is. If you use a ladder or stepladder: Make sure that it is fully opened. Do not climb a closed ladder. Make sure the sides of the ladder are locked in place. Have someone hold the ladder while you use it. Know where your pets are as you move through your home. What can I do in the bathroom?     Keep the floor dry. Clean up any water that is on the floor right away. Remove soap buildup in the bathtub or shower. Buildup makes bathtubs and showers slippery. Use non-skid mats or decals on the floor of the bathtub or shower. Attach bath mats securely with double-sided, non-slip rug tape. If you need to sit down while you are in the shower, use a non-slip stool. Install grab bars by the toilet and in the bathtub and shower. Do not use towel bars as grab bars. What can I do in the bedroom? Make sure that you have a light by your bed that is easy to reach. Do not use any sheets or blankets on your bed that hang to the floor. Have a firm bench or chair with side arms that you can use for support when you get dressed. What can I do in the kitchen? Clean up any spills right away. If you need to reach something above you, use a sturdy step stool that has a grab bar. Keep electrical cables out of the  way. Do not use floor polish or wax that makes floors slippery. What can I do with my stairs? Do not leave anything on the stairs. Make sure that you have a light switch at the top and the bottom of the stairs. Have them installed if you do not have them. Make sure that there are handrails on both sides of the stairs. Fix handrails that are broken or loose. Make sure that handrails are as long as the staircases. Install non-slip stair treads on all stairs in your home if they do not have carpet. Avoid having throw rugs at the top or bottom of stairs, or secure the rugs with carpet tape to prevent them from moving. Choose a carpet design that does not hide the  edge of steps on the stairs. Make sure that carpet is firmly attached to the stairs. Fix any carpet that is loose or worn. What can I do on the outside of my home? Use bright outdoor lighting. Repair the edges of walkways and driveways and fix any cracks. Clear paths of anything that can make you trip, such as tools or rocks. Add color or contrast paint or tape to clearly mark and help you see high doorway thresholds. Trim any bushes or trees on the main path into your home. Check that handrails are securely fastened and in good repair. Both sides of all steps should have handrails. Install guardrails along the edges of any raised decks or porches. Have leaves, snow, and ice cleared regularly. Use sand, salt, or ice melt on walkways during winter months if you live where there is ice and snow. In the garage, clean up any spills right away, including grease or oil spills. What other actions can I take? Review your medicines with your health care provider. Some medicines can make you confused or feel dizzy. This can increase your chance of falling. Wear closed-toe shoes that fit well and support your feet. Wear shoes that have rubber soles and low heels. Use a cane, walker, scooter, or crutches that help you move around if needed. Talk with your provider about other ways that you can decrease your risk of falls. This may include seeing a physical therapist to learn to do exercises to improve movement and strength. Where to find more information Centers for Disease Control and Prevention, [employer]: TonerPromos.no [employer] on Aging: BaseRingTones.pl National Institute on Aging: BaseRingTones.pl Contact a health care provider if: You are afraid of falling at home. You feel weak, drowsy, or dizzy at home. You fall at home. Get help right away if you: Lose consciousness or have trouble moving after a fall. Have a fall that causes a head injury. These symptoms may be an emergency. Get help right away. Call  911. Do not wait to see if the symptoms will go away. Do not drive yourself to the hospital. This information is not intended to replace advice given to you by your health care provider. Make sure you discuss any questions you have with your health care provider. Document Revised: 05/08/2022 Document Reviewed: 05/08/2022 Elsevier Patient Education  2024 Else[employer] Inc.  Managing Pain Without Opioids Opioids are strong medicines used to treat moderate to severe pain. For some people, especially those who have long-term (chronic) pain, opioids may not be the best choice for pain management due to: Side effects like nausea, constipation, and sleepiness. The risk of addiction (opioid use disorder). The longer you take opioids, the greater your risk of addiction. Pain that lasts for more than  3 months is called chronic pain. Managing chronic pain usually requires more than one approach and is often provided by a team of health care providers working together (multidisciplinary approach). Pain management may be done at a pain management center or pain clinic. How to manage pain without the use of opioids Use non-opioid medicines Non-opioid medicines for pain may include: Over-the-counter or prescription non-steroidal anti-inflammatory drugs (NSAIDs). These may be the first medicines used for pain. They work well for muscle and bone pain, and they reduce swelling. Acetaminophen. This over-the-counter medicine may work well for milder pain but not swelling. Antidepressants. These may be used to treat chronic pain. A certain type of antidepressant (tricyclics) is often used. These medicines are given in lower doses for pain than when used for depression. Anticonvulsants. These are usually used to treat seizures but may also reduce nerve (neuropathic) pain. Muscle relaxants. These relieve pain caused by sudden muscle tightening (spasms). You may also use a pain medicine that is applied to the skin as a  patch, cream, or gel (topical analgesic), such as a numbing medicine. These may cause fewer side effects than medicines taken by mouth. Do certain therapies as directed Some therapies can help with pain management. They include: Physical therapy. You will do exercises to gain strength and flexibility. A physical therapist may teach you exercises to move and stretch parts of your body that are weak, stiff, or painful. You can learn these exercises at physical therapy visits and practice them at home. Physical therapy may also involve: Massage. Heat wraps or applying heat or cold to affected areas. Electrical signals that interrupt pain signals (transcutaneous electrical nerve stimulation, TENS). Weak lasers that reduce pain and swelling (low-level laser therapy). Signals from your body that help you learn to regulate pain (biofeedback). Occupational therapy. This helps you to learn ways to function at home and work with less pain. Recreational therapy. This involves trying new activities or hobbies, such as a physical activity or drawing. Mental health therapy, including: Cognitive behavioral therapy (CBT). This helps you learn coping skills for dealing with pain. Acceptance and commitment therapy (ACT) to change the way you think and react to pain. Relaxation therapies, including muscle relaxation exercises and mindfulness-based stress reduction. Pain management counseling. This may be individual, family, or group counseling.  Receive medical treatments Medical treatments for pain management include: Nerve block injections. These may include a pain blocker and anti-inflammatory medicines. You may have injections: Near the spine to relieve chronic back or neck pain. Into joints to relieve back or joint pain. Into nerve areas that supply a painful area to relieve body pain. Into muscles (trigger point injections) to relieve some painful muscle conditions. A medical device placed near your spine  to help block pain signals and relieve nerve pain or chronic back pain (spinal cord stimulation device). Acupuncture. Follow these instructions at home Medicines Take over-the-counter and prescription medicines only as told by your health care provider. If you are taking pain medicine, ask your health care providers about possible side effects to watch out for. Do not drive or use heavy machinery while taking prescription opioid pain medicine. Lifestyle  Do not use drugs or alcohol to reduce pain. If you drink alcohol, limit how much you have to: 0-1 drink a day for women who are not pregnant. 0-2 drinks a day for men. Know how much alcohol is in a drink. In the [place] one drink equals one 12 oz bottle of beer (355 mL), one 5 oz  glass of wine (148 mL), or one 1 oz glass of hard liquor (44 mL). Do not use any products that contain nicotine or tobacco. These products include cigarettes, chewing tobacco, and vaping devices, such as e-cigarettes. If you need help quitting, ask your health care provider. Eat a healthy diet and maintain a healthy weight. Poor diet and excess weight may make pain worse. Eat foods that are high in fiber. These include fresh fruits and vegetables, whole grains, and beans. Limit foods that are high in fat and processed sugars, such as fried and sweet foods. Exercise regularly. Exercise lowers stress and may help relieve pain. Ask your health care provider what activities and exercises are safe for you. If your health care provider approves, join an exercise class that combines movement and stress reduction. Examples include yoga and tai chi. Get enough sleep. Lack of sleep may make pain worse. Lower stress as much as possible. Practice stress reduction techniques as told by your therapist. General instructions Work with all your pain management providers to find the treatments that work best for you. You are an important member of your pain management team. There are  many things you can do to reduce pain on your own. Consider joining an online or in-person support group for people who have chronic pain. Keep all follow-up visits. This is important. Where to find more information You can find more information about managing pain without opioids from: American Academy of Pain Medicine: painmed.org Institute for Chronic Pain: instituteforchronicpain.org American Chronic Pain Association: theacpa.org Contact a health care provider if: You have side effects from pain medicine. Your pain gets worse or does not get better with treatments or home therapy. You are struggling with anxiety or depression. Summary Many types of pain can be managed without opioids. Chronic pain may respond better to pain management without opioids. Pain is best managed when you and a team of health care providers work together. Pain management without opioids may include non-opioid medicines, medical treatments, physical therapy, mental health therapy, and lifestyle changes. Tell your health care providers if your pain gets worse or is not being managed well enough. This information is not intended to replace advice given to you by your health care provider. Make sure you discuss any questions you have with your health care provider. Document Revised: 12/15/2020 Document Reviewed: 12/15/2020 Elsevier Patient Education  2024 [employer].

## 2023-12-06 NOTE — Progress Notes (Signed)
 Subjective:   George Doyle is a 73 y.o. who presents for a Medicare Wellness preventive visit.  Visit Complete: Virtual I connected with  George Doyle on 12/06/23 by a audio enabled telemedicine application and verified that I am speaking with the correct person using two identifiers.  Patient Location: Home  Provider Location: Home Office  I discussed the limitations of evaluation and management by telemedicine. The patient expressed understanding and agreed to proceed.  Vital Signs: Because this visit was a virtual/telehealth visit, some criteria may be missing or patient reported. Any vitals not documented were not able to be obtained and vitals that have been documented are patient reported.  VideoDeclined- This patient declined George Doyle, academic. Therefore the visit was completed with audio only.  Persons Participating in Visit:  George Doyle, wife and patient was present during visit.  AWV Questionnaire: No: Patient Medicare AWV questionnaire was not completed prior to this visit.  Cardiac Risk Factors include: advanced age (>26men, >58 women);male gender;hypertension;dyslipidemia;obesity (BMI >30kg/m2);Other (see comment), Risk factor comments: CAD     Objective:    Today's Vitals   12/06/23 1551 12/06/23 1552  Weight: 205 lb (93 kg)   Height: 5\' 9"  (1.753 m)   PainSc:  6    Body mass index is 30.27 kg/m.     12/06/2023    4:12 PM 11/19/2023    6:53 AM 11/17/2023    9:12 PM 05/01/2023    9:00 AM 11/15/2022    2:03 PM 05/18/2022   11:07 AM 11/14/2021    2:13 PM  Advanced Directives  Does Patient Have a Medical Advance Directive? Yes Yes No Yes No Yes Yes  Type of Estate agent of [place];Living will Healthcare Power of [place];Living will  Healthcare Power of [place];Living will  Healthcare Power of [place];Living will Healthcare Power of [place];Living will  Does patient want to make changes to  medical advance directive? No - Patient declined     No - Patient declined   Copy of Healthcare Power of Attorney in Chart? Yes - validated most recent copy scanned in chart (See row information) No - copy requested    No - copy requested Yes - validated most recent copy scanned in chart (See row information)  Would patient like information on creating a medical advance directive?   No - Patient declined  Yes (MAU/Ambulatory/Procedural Areas - Information given)      Current Medications (verified) Outpatient Encounter Medications as of 12/06/2023  Medication Sig   acetaminophen (TYLENOL) 500 MG tablet Take 2 tablets (1,000 mg total) by mouth every 6 (six) hours as needed for mild pain.   aspirin EC 81 MG EC tablet Take 1 tablet (81 mg total) daily by mouth.   atorvastatin (LIPITOR) 80 MG tablet TAKE 1 TABLET(80 MG) BY MOUTH DAILY AT 6 PM   carvedilol (COREG) 3.125 MG tablet Take 1 tablet (3.125 mg total) by mouth 2 (two) times daily.   cholecalciferol (CHOLECALCIFEROL) 25 MCG tablet Take 1 tablet (1,000 Units total) by mouth daily.   citalopram (CELEXA) 20 MG tablet Take 20 mg by mouth at bedtime.   donepezil (ARICEPT) 5 MG tablet Take 5 mg by mouth at bedtime.   EPINEPHrine 0.3 mg/0.3 mL IJ SOAJ injection Inject 0.3 mg into the muscle as needed for anaphylaxis.   fexofenadine (ALLEGRA) 180 MG tablet Take 180 mg by mouth as needed for allergies or rhinitis. otc   lamoTRIgine (LAMICTAL) 200 MG tablet Take 200 mg by  mouth 2 (two) times daily.   losartan (COZAAR) 50 MG tablet Take 1 tablet (50 mg total) by mouth 2 (two) times daily.   methocarbamol (ROBAXIN) 500 MG tablet Take 1 tablet (500 mg total) by mouth every 8 (eight) hours as needed for muscle spasms.   nitroGLYCERIN (NITROSTAT) 0.4 MG SL tablet PLACE 1 TABLET UNDER THE TONGUE EVERY 5 MINUTES FOR 3 DOSES AS NEEDED FOR CHEST PAIN   oxyCODONE (OXY IR/ROXICODONE) 5 MG immediate release tablet Take 1 tablet (5 mg total) by mouth every 4 (four)  hours as needed for severe pain (pain score 7-10).   polyethylene glycol (MIRALAX / GLYCOLAX) 17 g packet Take 17 g by mouth daily.   spironolactone (ALDACTONE) 25 MG tablet Take 1 tablet (25 mg total) by mouth daily.   albuterol (VENTOLIN HFA) 108 (90 Base) MCG/ACT inhaler Inhale 2 puffs into the lungs every 6 (six) hours as needed for wheezing or shortness of breath. (Patient not taking: Reported on 12/06/2023)   clopidogrel (PLAVIX) 75 MG tablet Take 75 mg once daily for 21 days and then discontinue.  Monitor for signs of bleeding or bruising. (Patient not taking: Reported on 11/20/2023)   No facility-administered encounter medications on file as of 12/06/2023.    Allergies (verified) Patient has no known allergies.   History: Past Medical History:  Diagnosis Date   Allergy    Arthritis    knees, hands   Ascending aorta dilatation (HCC) 07/21/2017   a.) TTE 07/21/2017: Ao root 36 mm, asc Ao 41 mm; b.) TTE 10/22/2020: asc Ao 39 mm   Coronary artery disease    Diastolic dysfunction 07/21/2017   a.) TTE 07/21/2017 (following NSTEMI): EF 50-55%, inf HK, mild MR, G1DD   Dyspnea    Former tobacco use    History of 2019 novel coronavirus disease (COVID-19) 10/21/2020   HLD (hyperlipidemia)    Hypertension    NSTEMI (non-ST elevated myocardial infarction) (HCC) 07/20/2017   a.) CP started 5 days prior to presentation (approx 07/15/2017); b.) LHC 07/23/2017: 100% p-d RCA, 60% p-mLCx (tortuous), 99% post atrio --> unsuccessful PCI; c.) referred to interventionist George Doyle) in [place] on 08/30/2017 --> med mgmt recommended   NSTEMI (non-ST elevated myocardial infarction) (HCC) 10/23/2020   a.) Troponins trended: 307-->1304--> 6774 --> 6895 --> 5886 --> 5324; b.) LHC 10/23/2020: EF >55%; 40% pLCx, CTO pRCA --> med mgmt.   Pneumonia    SDH (subdural hematoma) (HCC) 03/27/2018   a.) 6 mm frontal SDH s/p fall related to tonic-clonic seizure   Seizures (HCC) 1970   a.) last documented seizure was in  10/2020   Umbilical hernia    Vasovagal syncope    Past Surgical History:  Procedure Laterality Date   BICEPT TENODESIS Right 07/18/2018   Procedure: BICEPS TENODESIS;  Surgeon: George Flake, MD;  Location: [facility] ORS;  Service: Orthopedics;  Laterality: Right;   CARDIAC CATHETERIZATION     COLONOSCOPY  2011   cleared for 10 yrs   COLONOSCOPY WITH PROPOFOL N/A 11/06/2016   Procedure: COLONOSCOPY WITH PROPOFOL;  Surgeon: Midge Minium, MD;  Location: [facility];  Service: Endoscopy;  Laterality: N/A;   COLONOSCOPY WITH PROPOFOL N/A 05/01/2023   Procedure: COLONOSCOPY WITH PROPOFOL;  Surgeon: Midge Minium, MD;  Location: [facility] END[facility];  Service: Endoscopy;  Laterality: N/A;   CORONARY STENT INTERVENTION N/A 07/23/2017   Procedure: CORONARY STENT INTERVENTION;  Surgeon: Alwyn Pea, MD;  Location: [facility] IN[facility] CV [facility];  Service: Cardiovascular;  Laterality: N/A;  CYST EXCISION     Forearm   INGUINAL HERNIA REPAIR Left    INSERTION OF MESH  05/25/2022   Procedure: INSERTION OF MESH;  Surgeon: Henrene Dodge, MD;  Location: [facility] ORS;  Service: General;;   KNEE SURGERY Bilateral    2 on R) 1 on L)   LEFT HEART CATH AND CORONARY ANGIOGRAPHY N/A 07/23/2017   Procedure: LEFT HEART CATH AND CORONARY ANGIOGRAPHY;  Surgeon: Antonieta Iba, MD;  Location: [facility];  Service: Cardiovascular;  Laterality: N/A;   LEFT HEART CATH AND CORONARY ANGIOGRAPHY Left 10/23/2020   Procedure: LEFT HEART CATH AND CORONARY ANGIOGRAPHY; Location: [facility]   POLYPECTOMY  11/06/2016   Procedure: POLYPECTOMY;  Surgeon: Midge Minium, MD;  Location: [facility];  Service: Endoscopy;;   SHOULDER ARTHROSCOPY WITH OPEN ROTATOR CUFF REPAIR Right 07/18/2018   Procedure: SHOULDER ARTHROSCOPY WITH OPEN ROTATOR CUFF REPAIR;  Surgeon: George Flake, MD;  Location: [facility] OR[facility];  Service: Orthopedics;  Laterality: Right;   TIBIA IM NAIL INSERTION Left 11/19/2023   Procedure: INTRAMEDULLARY (IM) NAIL  TIBIAL;  Surgeon: Roby Lofts, MD;  Location: [facility] OR;  Service: Orthopedics;  Laterality: Left;   Family History  Problem Relation Age of Onset   CAD Father    Lung cancer Father    Heart failure Mother    Social History   Socioeconomic History   Marital status: Married    Spouse name: George Doyle   Number of children: 2   Years of education: Not on file   Highest education level: Not on file  Occupational History   Occupation: retired  Tobacco Use   Smoking status: Former    Current packs/day: 0.00    Average packs/day: 1 pack/day for 14.0 years (14.0 ttl pk-yrs)    Types: Cigarettes    Start date: 59    Quit date: 1991    Years since quitting: 34.2   Smokeless tobacco: Never   Tobacco comments:    quit over 20 yrs ago (~1991)  Vaping Use   Vaping status: Never Used  Substance and Sexual Activity   Alcohol use: Yes    Alcohol/week: 0.0 standard drinks of alcohol    Comment: 1-2 drinks/year   Drug use: No   Sexual activity: Not Currently  Other Topics Concern   Not on file  Social History Narrative   Not on file   Social Drivers of Health   Financial Resource Strain: Low Risk  (12/06/2023)   Overall Financial Resource Strain (CARDIA)    Difficulty of Paying Living Expenses: Not hard at all  Food Insecurity: No Food Insecurity (12/06/2023)   Hunger Vital Sign    Worried About Running Out of Food in the Last Year: Never true    Ran Out of Food in the Last Year: Never true  Transportation Needs: No Transportation Needs (12/06/2023)   PRAPARE - Administrator, Civil Service (Medical): No    Lack of Transportation (Non-Medical): No  Physical Activity: Inactive (12/06/2023)   Exercise Vital Sign    Days of Exercise per Week: 0 days    Minutes of Exercise per Session: 0 min  Stress: No Stress Concern Present (12/06/2023)   [employer] of Occupational Health - Occupational Stress Questionnaire    Feeling of Stress : Only a little  Social Connections:  Moderately Integrated (12/06/2023)   Social Connection and Isolation Panel [NHANES]    Frequency of Communication with Friends and Family: More than three times a week  Frequency of Social Gatherings with Friends and Family: More than three times a week    Attends Religious Services: 1 to 4 times per year    Active Member of [employer] or Organizations: No    Attends Banker Meetings: Never    Marital Status: Married  Recent Concern: Social Connections - Moderately Isolated (11/18/2023)   Social Connection and Isolation Panel [NHANES]    Frequency of Communication with Friends and Family: Once a week    Frequency of Social Gatherings with Friends and Family: More than three times a week    Attends Religious Services: Never    Database administrator or Organizations: No    Attends Engineer, structural: Never    Marital Status: Married    Tobacco Counseling Counseling given: Not Answered Tobacco comments: quit over 20 yrs ago (~1991)    Clinical Intake:  Pre-visit preparation completed: Yes  Pain : 0-10 Pain Score: 6  Pain Type: Acute pain Pain Location: Foot (from blister) Pain Descriptors / Indicators: Aching     BMI - recorded: 30.27 Nutritional Status: BMI > 30  Obese Nutritional Risks: None Diabetes: No  No results found for: "HGBA1C"   How often do you need to have someone help you when you read instructions, pamphlets, or other written materials from your doctor or pharmacy?: 2 - Rarely (wife helps)  Interpreter Needed?: No  Information entered by :: Tora Kindred, CMA   Activities of Daily Living     12/06/2023    3:55 PM  In your present state of health, do you have any difficulty performing the following activities:  Hearing? 0  Vision? 0  Difficulty concentrating or making decisions? 0  Walking or climbing stairs? 1  Comment standard walker due to broken tibia and fibula  Dressing or bathing? 0  Doing errands, shopping? 0  Preparing  Food and eating ? Y  Comment wife does all the cooking  Using the Toilet? N  In the past six months, have you accidently leaked urine? N  Do you have problems with loss of bowel control? N  Managing your Medications? Y  Comment wife fills pill box  Managing your Finances? N  Housekeeping or managing your Housekeeping? N    Patient Care Team: Duanne Limerick, MD as PCP - General (Family Medicine) Mariah Milling Tollie Pizza, MD as PCP - Cardiology (Cardiology) Morene Crocker, MD as Referring Physician (Neurology) Sondra Come, MD as Consulting Physician (Urology) Midge Minium, MD as Consulting Physician (Gastroenterology) Estanislado Pandy, MD as Consulting Physician (Ophthalmology)  Indicate any recent Medical Services you may have received from other than [facility] providers in the past year (date may be approximate).     Assessment:   This is a routine wellness examination for [place].  Hearing/Vision screen Hearing Screening - Comments:: Denies hearing loss Vision Screening - Comments:: Gets eye exams, Dr. Rolley Sims @ [place], [place]   Goals Addressed             This Visit's Progress    Patient Stated       Recover from broken tib/fib and be more active       Depression Screen     12/06/2023    4:07 PM 08/30/2023   10:07 AM 06/14/2023    4:06 PM 02/27/2023   10:42 AM 11/15/2022    2:01 PM 08/28/2022   10:32 AM 05/03/2022   11:36 AM  PHQ 2/9 Scores  PHQ - 2 Score  2 0 0 0 0 0 0  PHQ- 9 Score 4 0 0 0 0 0 0    Fall Risk     12/06/2023    4:14 PM 08/30/2023   10:08 AM 06/14/2023    4:06 PM 02/27/2023   10:42 AM 11/15/2022    2:03 PM  Fall Risk   Falls in the past year? 1 0 0 0 0  Number falls in past yr: 1 0 0 0 0  Injury with Fall? 1 0 0 0 0  Risk for fall due to : History of fall(s);Impaired balance/gait;Orthopedic patient No Fall Risks No Fall Risks No Fall Risks No Fall Risks  Follow up Falls prevention discussed;Falls evaluation completed;Education  provided Falls evaluation completed Falls evaluation completed Falls evaluation completed Falls prevention discussed;Falls evaluation completed    MEDICARE RISK AT HOME:  Medicare Risk at Home Any stairs in or around the home?: Yes (1 step) If so, are there any without handrails?: Yes Home free of loose throw rugs in walkways, pet beds, electrical cords, etc?: Yes Adequate lighting in your home to reduce risk of falls?: Yes Life alert?: No Use of a cane, walker or w/c?: Yes (currently using a standard walker due to broken tib/fib) Grab bars in the bathroom?: Yes Shower chair or bench in shower?: Yes Elevated toilet seat or a handicapped toilet?: Yes  TIMED UP AND GO:  Was the test performed?  No  Cognitive Function: 6CIT completed        12/06/2023    4:17 PM 11/15/2022    2:07 PM 11/10/2019    2:58 PM  6CIT Screen  What Year? 0 points 0 points 0 points  What month? 0 points 0 points 0 points  What time? 0 points 0 points 0 points  Count back from 20 0 points 2 points 0 points  Months in reverse 0 points 2 points 0 points  Repeat phrase 0 points 0 points 2 points  Total Score 0 points 4 points 2 points    Immunizations Immunization History  Administered Date(s) Administered   Fluad Quad(high Dose 65+) 06/18/2019   Fluad Trivalent(High Dose 65+) 06/14/2023   Influenza, High Dose Seasonal PF 06/25/2018, 07/01/2021   Influenza,inj,Quad PF,6+ Mos 05/10/2017   Influenza-Unspecified 06/28/2015, 06/07/2020, 07/20/2022   PFIZER(Purple Top)SARS-COV-2 Vaccination 10/11/2019, 11/01/2019, 06/19/2020   Pneumococcal Conjugate-13 10/12/2016   Pneumococcal Polysaccharide-23 07/12/2015, 12/25/2017   Respiratory Syncytial Virus Vaccine,Recomb Aduvanted(Arexvy) 07/20/2022   Tdap 10/12/2016   Zoster Recombinant(Shingrix) 04/26/2017, 08/23/2017    Screening Tests Health Maintenance  Topic Date Due   COVID-19 Vaccine (4 - 2024-25 season) 05/20/2023   Medicare Annual Wellness (AWV)   12/05/2024   DTaP/Tdap/Td (2 - Td or Tdap) 10/12/2026   Colonoscopy  04/30/2028   Pneumonia Vaccine 4+ Years old  Completed   INFLUENZA VACCINE  Completed   Hepatitis C Screening  Completed   Zoster Vaccines- Shingrix  Completed   HPV VACCINES  Aged Out    Health Maintenance  Health Maintenance Due  Topic Date Due   COVID-19 Vaccine (4 - 2024-25 season) 05/20/2023   Health Maintenance Items Addressed: See Nurse Notes  Additional Screening:  Vision Screening: Recommended annual ophthalmology exams for early detection of glaucoma and other disorders of the eye.  Dental Screening: Recommended annual dental exams for proper oral hygiene  Community Resource Referral / Chronic Care Management: CRR required this visit?  No   CCM required this visit?  No     Plan:  I have personally reviewed and noted the following in the patient's chart:   Medical and social history Use of alcohol, tobacco or illicit drugs  Current medications and supplements including opioid prescriptions. Patient is currently taking opioid prescriptions. Information provided to patient regarding non-opioid alternatives. Patient advised to discuss non-opioid treatment plan with their provider. Functional ability and status Nutritional status Physical activity Advanced directives List of other physicians Hospitalizations, surgeries, and ER visits in previous 12 months Vitals Screenings to include cognitive, depression, and falls Referrals and appointments  In addition, I have reviewed and discussed with patient certain preventive protocols, quality metrics, and best practice recommendations. A written personalized care plan for preventive services as well as general preventive health recommendations were provided to patient.     Tora Kindred, CMA   12/06/2023   After Visit Summary: (MyChart) Due to this being a telephonic visit, the after visit summary with patients personalized plan was offered  to patient via MyChart   Notes:  Covid vaccines UTD per patient Last colon 05/01/23, recommended repeat due to poor prep. Patient declined at this time

## 2023-12-07 ENCOUNTER — Encounter: Payer: Self-pay | Admitting: Cardiovascular Disease

## 2023-12-12 ENCOUNTER — Ambulatory Visit: Attending: Cardiovascular Disease

## 2023-12-12 DIAGNOSIS — I639 Cerebral infarction, unspecified: Secondary | ICD-10-CM

## 2023-12-12 DIAGNOSIS — I6389 Other cerebral infarction: Secondary | ICD-10-CM

## 2023-12-12 DIAGNOSIS — I2510 Atherosclerotic heart disease of native coronary artery without angina pectoris: Secondary | ICD-10-CM | POA: Diagnosis not present

## 2023-12-12 DIAGNOSIS — I5020 Unspecified systolic (congestive) heart failure: Secondary | ICD-10-CM

## 2023-12-12 LAB — ECHOCARDIOGRAM COMPLETE
AR max vel: 2.38 cm2
AV Area VTI: 2.41 cm2
AV Area mean vel: 2.34 cm2
AV Mean grad: 5 mmHg
AV Peak grad: 10.2 mmHg
Ao pk vel: 1.6 m/s
Area-P 1/2: 3.21 cm2

## 2023-12-14 ENCOUNTER — Encounter: Payer: Self-pay | Admitting: Cardiovascular Disease

## 2023-12-14 ENCOUNTER — Encounter: Payer: Self-pay | Admitting: Emergency Medicine

## 2023-12-20 ENCOUNTER — Ambulatory Visit (INDEPENDENT_AMBULATORY_CARE_PROVIDER_SITE_OTHER): Admitting: Family Medicine

## 2023-12-20 ENCOUNTER — Encounter: Payer: Self-pay | Admitting: Family Medicine

## 2023-12-20 VITALS — BP 128/60 | HR 72 | Ht 69.0 in | Wt 197.8 lb

## 2023-12-20 DIAGNOSIS — E871 Hypo-osmolality and hyponatremia: Secondary | ICD-10-CM

## 2023-12-20 DIAGNOSIS — D508 Other iron deficiency anemias: Secondary | ICD-10-CM

## 2023-12-20 NOTE — Progress Notes (Signed)
 Date:  12/20/2023   Name:  George Doyle   DOB:  02-24-1951   MRN:  [id number]   Chief Complaint: Fall (Patient following up from a fall he had on 0/01/25. He is wearing a boot on his left foot and he is walking with a walker today. He is doing good and improving on a daily basis. )  Patient is a 73 year old male who presents for a comprehensive physical exam. The patient reports the following problems: anemia/hyponatremia. Health maintenance has been reviewed up to date.      Lab Results  Component Value Date   NA 134 (L) 11/20/2023   K 3.9 11/20/2023   CO2 24 11/20/2023   GLUCOSE 117 (H) 11/20/2023   BUN 13 11/20/2023   CREATININE 1.01 11/20/2023   CALCIUM 8.8 (L) 11/20/2023   EGFR 73 02/27/2023   GFRNONAA >60 11/20/2023   Lab Results  Component Value Date   CHOL 111 02/27/2023   HDL 59 02/27/2023   LDLCALC 39 02/27/2023   TRIG 57 02/27/2023   CHOLHDL 2.1 06/11/2018   No results found for: "TSH" No results found for: "HGBA1C" Lab Results  Component Value Date   WBC 9.7 11/21/2023   HGB 10.5 (L) 11/21/2023   HCT 31.1 (L) 11/21/2023   MCV 90.4 11/21/2023   PLT 175 11/21/2023   Lab Results  Component Value Date   ALT 13 11/18/2023   AST 19 11/18/2023   ALKPHOS 62 11/18/2023   BILITOT 0.8 11/18/2023   Lab Results  Component Value Date   VD25OH 31.19 11/19/2023     Review of Systems  Eyes:  Negative for visual disturbance.  Respiratory:  Negative for cough, choking, chest tightness, shortness of breath, wheezing and stridor.   Cardiovascular:  Negative for chest pain, palpitations and leg swelling.  Gastrointestinal:  Negative for abdominal distention, abdominal pain, anal bleeding, blood in stool, constipation, diarrhea and nausea.  Endocrine: Negative for polydipsia and polyuria.  Genitourinary:  Negative for difficulty urinating.    Patient Active Problem List   Diagnosis Date Noted   PAD (peripheral artery disease) (HCC) 11/18/2023    Hip fracture (HCC) 11/18/2023   Tibia/fibula fracture 11/17/2023   History of colonic polyps 05/01/2023   Umbilical hernia without obstruction and without gangrene    Benign neoplasm of skin of left forearm 01/27/2019   Pure hypercholesterolemia 06/21/2018   Poorly-controlled hypertension 06/21/2018   Seizure disorder (HCC) 06/21/2018   Hill-Sachs lesion of right shoulder 06/19/2018   Injury of tendon of long head of right biceps 06/19/2018   Rotator cuff tendinitis, right 06/19/2018   Traumatic complete tear of right rotator cuff 06/19/2018   Headache disorder 05/09/2018   CAD (coronary artery disease), native coronary artery 03/28/2018   History of seizures 03/28/2018   Subdural hematoma (HCC) 03/27/2018   Vasovagal syncope 03/27/2018   Old MI (myocardial infarction) 08/30/2017   CAD, multiple vessel 08/01/2017   NSTEMI (non-ST elevated myocardial infarction) (HCC) 07/20/2017   Hyperlipidemia 05/10/2017   Preop cardiovascular exam    Benign neoplasm of ascending colon    Polyp of sigmoid colon    Essential hypertension 03/03/2016    No Known Allergies  Past Surgical History:  Procedure Laterality Date   BICEPT TENODESIS Right 07/18/2018   Procedure: BICEPS TENODESIS;  Surgeon: Christena Flake, MD;  Location: [facility];  Service: Orthopedics;  Laterality: Right;   CARDIAC CATHETERIZATION     COLONOSCOPY  2011   cleared for 10 yrs  COLONOSCOPY WITH PROPOFOL N/A 11/06/2016   Procedure: COLONOSCOPY WITH PROPOFOL;  Surgeon: Midge Minium, MD;  Location: [facility];  Service: Endoscopy;  Laterality: N/A;   COLONOSCOPY WITH PROPOFOL N/A 05/01/2023   Procedure: COLONOSCOPY WITH PROPOFOL;  Surgeon: Midge Minium, MD;  Location: [facility];  Service: Endoscopy;  Laterality: N/A;   CORONARY STENT INTERVENTION N/A 07/23/2017   Procedure: CORONARY STENT INTERVENTION;  Surgeon: Alwyn Pea, MD;  Location: [facility];  Service: Cardiovascular;  Laterality: N/A;    CYST EXCISION     Forearm   INGUINAL HERNIA REPAIR Left    INSERTION OF MESH  05/25/2022   Procedure: INSERTION OF MESH;  Surgeon: Henrene Dodge, MD;  Location: [facility];  Service: General;;   KNEE SURGERY Bilateral    2 on R) 1 on L)   LEFT HEART CATH AND CORONARY ANGIOGRAPHY N/A 07/23/2017   Procedure: LEFT HEART CATH AND CORONARY ANGIOGRAPHY;  Surgeon: Antonieta Iba, MD;  Location: [facility];  Service: Cardiovascular;  Laterality: N/A;   LEFT HEART CATH AND CORONARY ANGIOGRAPHY Left 10/23/2020   Procedure: LEFT HEART CATH AND CORONARY ANGIOGRAPHY; Location: [facility]   POLYPECTOMY  11/06/2016   Procedure: POLYPECTOMY;  Surgeon: Midge Minium, MD;  Location: [facility];  Service: Endoscopy;;   SHOULDER ARTHROSCOPY WITH OPEN ROTATOR CUFF REPAIR Right 07/18/2018   Procedure: SHOULDER ARTHROSCOPY WITH OPEN ROTATOR CUFF REPAIR;  Surgeon: Christena Flake, MD;  Location: [facility];  Service: Orthopedics;  Laterality: Right;   TIBIA IM NAIL INSERTION Left 11/19/2023   Procedure: INTRAMEDULLARY (IM) NAIL TIBIAL;  Surgeon: Roby Lofts, MD;  Location: [facility];  Service: Orthopedics;  Laterality: Left;    Social History   Tobacco Use   Smoking status: Former    Current packs/day: 0.00    Average packs/day: 1 pack/day for 14.0 years (14.0 ttl pk-yrs)    Types: Cigarettes    Start date: 99    Quit date: 46    Years since quitting: 34.2   Smokeless tobacco: Never   Tobacco comments:    quit over 20 yrs ago (~1991)  Vaping Use   Vaping status: Never Used  Substance Use Topics   Alcohol use: Yes    Alcohol/week: 0.0 standard drinks of alcohol    Comment: 1-2 drinks/year   Drug use: No     Medication list has been reviewed and updated.  Current Meds  Medication Sig   acetaminophen (TYLENOL) 500 MG tablet Take 2 tablets (1,000 mg total) by mouth every 6 (six) hours as needed for mild pain.   albuterol (VENTOLIN HFA) 108 (90 Base) MCG/ACT inhaler Inhale 2 puffs into the  lungs every 6 (six) hours as needed for wheezing or shortness of breath.   aspirin EC 81 MG EC tablet Take 1 tablet (81 mg total) daily by mouth.   atorvastatin (LIPITOR) 80 MG tablet TAKE 1 TABLET(80 MG) BY MOUTH DAILY AT 6 PM   carvedilol (COREG) 3.125 MG tablet Take 1 tablet (3.125 mg total) by mouth 2 (two) times daily.   cholecalciferol (CHOLECALCIFEROL) 25 MCG tablet Take 1 tablet (1,000 Units total) by mouth daily.   citalopram (CELEXA) 20 MG tablet Take 20 mg by mouth at bedtime.   clopidogrel (PLAVIX) 75 MG tablet    donepezil (ARICEPT) 5 MG tablet Take 5 mg by mouth at bedtime.   EPINEPHrine 0.3 mg/0.3 mL IJ SOAJ injection Inject 0.3 mg into the muscle as needed for anaphylaxis.   fexofenadine (  ALLEGRA) 180 MG tablet Take 180 mg by mouth as needed for allergies or rhinitis. otc   lamoTRIgine (LAMICTAL) 200 MG tablet Take 200 mg by mouth 2 (two) times daily.   losartan (COZAAR) 50 MG tablet Take 1 tablet (50 mg total) by mouth 2 (two) times daily.   methocarbamol (ROBAXIN) 500 MG tablet Take 1 tablet (500 mg total) by mouth every 8 (eight) hours as needed for muscle spasms.   nitroGLYCERIN (NITROSTAT) 0.4 MG SL tablet PLACE 1 TABLET UNDER THE TONGUE EVERY 5 MINUTES FOR 3 DOSES AS NEEDED FOR CHEST PAIN   oxyCODONE (OXY IR/ROXICODONE) 5 MG immediate release tablet Take 1 tablet (5 mg total) by mouth every 4 (four) hours as needed for severe pain (pain score 7-10).   polyethylene glycol (MIRALAX / GLYCOLAX) 17 g packet Take 17 g by mouth daily.   spironolactone (ALDACTONE) 25 MG tablet Take 1 tablet (25 mg total) by mouth daily.       12/20/2023    2:31 PM 08/30/2023   10:08 AM 06/14/2023    4:06 PM 02/27/2023   10:42 AM  GAD 7 : Generalized Anxiety Score  Nervous, Anxious, on Edge 1 0 0 0  Control/stop worrying 1 0 0 0  Worry too much - different things 1 0 0 0  Trouble relaxing 1 0 0 0  Restless 1 0 0 0  Easily annoyed or irritable 1 0 0 0  Afraid - awful might happen 0 0 0 0   Total GAD 7 Score 6 0 0 0  Anxiety Difficulty Somewhat difficult Not difficult at all Not difficult at all Not difficult at all       12/20/2023    2:31 PM 12/06/2023    4:07 PM 08/30/2023   10:07 AM  Depression screen PHQ 2/9  Decreased Interest 1 1 0  Down, Depressed, Hopeless 1 1 0  PHQ - 2 Score 2 2 0  Altered sleeping 0 0 0  Tired, decreased energy 2 1 0  Change in appetite 1 1 0  Feeling bad or failure about yourself  0 0 0  Trouble concentrating 0 0 0  Moving slowly or fidgety/restless 1 0 0  Suicidal thoughts 0 0 0  PHQ-9 Score 6 4 0  Difficult doing work/chores Somewhat difficult Somewhat difficult Not difficult at all    BP Readings from Last 3 Encounters:  12/20/23 128/60  11/21/23 (!) 166/80  10/29/23 (!) 140/78    Physical Exam Vitals and nursing note reviewed.  Constitutional:      Appearance: He is well-developed.  HENT:     Head: Normocephalic and atraumatic.     Right Ear: Tympanic membrane, ear canal and external ear normal.     Left Ear: Tympanic membrane, ear canal and external ear normal.     Nose: Nose normal.     Mouth/Throat:     Dentition: Normal dentition.  Eyes:     General: Lids are normal. No scleral icterus.    Conjunctiva/sclera: Conjunctivae normal.     Pupils: Pupils are equal, round, and reactive to light.  Neck:     Thyroid: No thyromegaly.     Vascular: No carotid bruit, hepatojugular reflux or JVD.     Trachea: No tracheal deviation.  Cardiovascular:     Rate and Rhythm: Normal rate and regular rhythm.     Heart sounds: Normal heart sounds. No murmur heard.    No friction rub. No gallop.  Pulmonary:  Effort: Pulmonary effort is normal.     Breath sounds: Normal breath sounds. No wheezing, rhonchi or rales.  Abdominal:     General: Bowel sounds are normal.     Palpations: Abdomen is soft. There is no hepatomegaly, splenomegaly or mass.     Tenderness: There is no abdominal tenderness.     Hernia: There is no hernia in  the left inguinal area.  Musculoskeletal:        General: Normal range of motion.     Cervical back: Normal range of motion and neck supple.  Lymphadenopathy:     Cervical: No cervical adenopathy.  Skin:    General: Skin is warm and dry.     Findings: No rash.  Neurological:     Mental Status: He is alert and oriented to person, place, and time.     Sensory: No sensory deficit.     Deep Tendon Reflexes: Reflexes are normal and symmetric.  Psychiatric:        Mood and Affect: Mood is not anxious or depressed.     Wt Readings from Last 3 Encounters:  12/20/23 197 lb 12.8 oz (89.7 kg)  12/06/23 205 lb (93 kg)  11/19/23 206 lb (93.4 kg)    BP 128/60   Pulse 72   Ht 5\' 9"  (1.753 m)   Wt 197 lb 12.8 oz (89.7 kg)   SpO2 94%   BMI 29.21 kg/m   Assessment and Plan: 1. Other iron deficiency anemia (Primary) Patient is following up from hospitalization for fracture of the tibia underwent surgery with resultant anemia likely due to acute blood loss.  We will check CBC for current level of hemoglobin and hematocrit as well as indices. - CBC with Differential/Platelet  2. Hyponatremia New onset.  Episodic.  Patient had low sodium likely due to delusional from IV fluids that were hypotonic and we will recheck a renal function panel to see if patient has had correction of his electrolytes and GFR has returned to normal range. - Basic metabolic panel with GFR     Elizabeth Sauer, MD

## 2023-12-21 ENCOUNTER — Encounter: Payer: Self-pay | Admitting: Family Medicine

## 2023-12-21 LAB — CBC WITH DIFFERENTIAL/PLATELET
Basophils Absolute: 0 10*3/uL (ref 0.0–0.2)
Basos: 0 %
EOS (ABSOLUTE): 0 10*3/uL (ref 0.0–0.4)
Eos: 1 %
Hematocrit: 37.4 % — ABNORMAL LOW (ref 37.5–51.0)
Hemoglobin: 12.5 g/dL — ABNORMAL LOW (ref 13.0–17.7)
Immature Grans (Abs): 0.1 10*3/uL (ref 0.0–0.1)
Immature Granulocytes: 1 %
Lymphocytes Absolute: 1.3 10*3/uL (ref 0.7–3.1)
Lymphs: 15 %
MCH: 30.7 pg (ref 26.6–33.0)
MCHC: 33.4 g/dL (ref 31.5–35.7)
MCV: 92 fL (ref 79–97)
Monocytes Absolute: 0.6 10*3/uL (ref 0.1–0.9)
Monocytes: 7 %
Neutrophils Absolute: 6.4 10*3/uL (ref 1.4–7.0)
Neutrophils: 76 %
Platelets: 390 10*3/uL (ref 150–450)
RBC: 4.07 x10E6/uL — ABNORMAL LOW (ref 4.14–5.80)
RDW: 13 % (ref 11.6–15.4)
WBC: 8.4 10*3/uL (ref 3.4–10.8)

## 2023-12-21 LAB — BASIC METABOLIC PANEL WITH GFR
BUN/Creatinine Ratio: 19 (ref 10–24)
BUN: 19 mg/dL (ref 8–27)
CO2: 22 mmol/L (ref 20–29)
Calcium: 10 mg/dL (ref 8.6–10.2)
Chloride: 98 mmol/L (ref 96–106)
Creatinine, Ser: 1.01 mg/dL (ref 0.76–1.27)
Glucose: 92 mg/dL (ref 70–99)
Potassium: 5.4 mmol/L — ABNORMAL HIGH (ref 3.5–5.2)
Sodium: 134 mmol/L (ref 134–144)
eGFR: 78 mL/min/{1.73_m2} (ref >59)

## 2024-02-01 ENCOUNTER — Ambulatory Visit
Admission: RE | Admit: 2024-02-01 | Discharge: 2024-02-01 | Disposition: A | Discharge status: Home / Self Care | Source: Ambulatory Visit | Attending: Family Medicine | Admitting: Family Medicine

## 2024-02-01 ENCOUNTER — Encounter: Payer: Self-pay | Admitting: Family Medicine

## 2024-02-01 ENCOUNTER — Ambulatory Visit (INDEPENDENT_AMBULATORY_CARE_PROVIDER_SITE_OTHER): Admitting: Family Medicine

## 2024-02-01 ENCOUNTER — Other Ambulatory Visit
Admission: RE | Admit: 2024-02-01 | Discharge: 2024-02-01 | Disposition: A | Discharge status: Home / Self Care | Source: Home / Self Care | Attending: Family Medicine | Admitting: Family Medicine

## 2024-02-01 ENCOUNTER — Ambulatory Visit
Admission: RE | Admit: 2024-02-01 | Discharge: 2024-02-01 | Disposition: A | Discharge status: Home / Self Care | Attending: Family Medicine | Admitting: Family Medicine

## 2024-02-01 VITALS — BP 122/64 | HR 80 | Temp 97.5°F | Ht 69.0 in | Wt 200.0 lb

## 2024-02-01 DIAGNOSIS — R051 Acute cough: Secondary | ICD-10-CM | POA: Diagnosis present

## 2024-02-01 DIAGNOSIS — J4520 Mild intermittent asthma, uncomplicated: Secondary | ICD-10-CM

## 2024-02-01 LAB — CBC WITH DIFFERENTIAL/PLATELET
Abs Immature Granulocytes: 0.06 10*3/uL (ref 0.00–0.07)
Basophils Absolute: 0 10*3/uL (ref 0.0–0.1)
Basophils Relative: 0 %
Eosinophils Absolute: 0.1 10*3/uL (ref 0.0–0.5)
Eosinophils Relative: 1 %
HCT: 38.3 % — ABNORMAL LOW (ref 39.0–52.0)
Hemoglobin: 12.6 g/dL — ABNORMAL LOW (ref 13.0–17.0)
Immature Granulocytes: 1 %
Lymphocytes Relative: 15 %
Lymphs Abs: 1.4 10*3/uL (ref 0.7–4.0)
MCH: 29.8 pg (ref 26.0–34.0)
MCHC: 32.9 g/dL (ref 30.0–36.0)
MCV: 90.5 fL (ref 80.0–100.0)
Monocytes Absolute: 0.8 10*3/uL (ref 0.1–1.0)
Monocytes Relative: 8 %
Neutro Abs: 7.1 10*3/uL (ref 1.7–7.7)
Neutrophils Relative %: 75 %
Platelets: 290 10*3/uL (ref 150–400)
RBC: 4.23 MIL/uL (ref 4.22–5.81)
RDW: 13.7 % (ref 11.5–15.5)
WBC: 9.5 10*3/uL (ref 4.0–10.5)
nRBC: 0 % (ref 0.0–0.2)

## 2024-02-01 MED ORDER — PROMETHAZINE-DM 6.25-15 MG/5ML PO SYRP: 5.0000 mL | ORAL_SOLUTION | Freq: Four times a day (QID) | ORAL | 0 refills | Status: DC | PRN

## 2024-02-01 MED ORDER — AZITHROMYCIN 250 MG PO TABS: ORAL_TABLET | ORAL | 0 refills | Status: AC

## 2024-02-01 NOTE — Progress Notes (Signed)
 Date:  02/01/2024   Name:  George Doyle   DOB:  Dec 16, 1950   MRN:  [id number]   Chief Complaint: Cough (X 2 weeks, unchanged, dry cough, feels like mucous is in chest, no fever, runny nose, OTC medication does not help)  Cough This is a new problem. The current episode started 1 to 4 weeks ago (2 weeks). The problem has been gradually worsening. The problem occurs every few minutes. The cough is Non-productive. Associated symptoms include postnasal drip, shortness of breath and wheezing. Pertinent negatives include no chest pain, chills, ear congestion, ear pain, fever, headaches, heartburn, hemoptysis, myalgias, nasal congestion, rash, rhinorrhea, sore throat, sweats or weight loss. The symptoms are aggravated by lying down. Risk factors for lung disease include smoking/tobacco exposure. He has tried a beta-agonist inhaler and OTC cough suppressant for the symptoms. The treatment provided mild relief. His past medical history is significant for COPD. There is no history of asthma. stopped smoking 20 years ago    Lab Results  Component Value Date   NA 134 12/20/2023   K 5.4 (H) 12/20/2023   CO2 22 12/20/2023   GLUCOSE 92 12/20/2023   BUN 19 12/20/2023   CREATININE 1.01 12/20/2023   CALCIUM  10.0 12/20/2023   EGFR 78 12/20/2023   GFRNONAA >60 11/20/2023   Lab Results  Component Value Date   CHOL 111 02/27/2023   HDL 59 02/27/2023   LDLCALC 39 02/27/2023   TRIG 57 02/27/2023   CHOLHDL 2.1 06/11/2018   No results found for: "TSH" No results found for: "HGBA1C" Lab Results  Component Value Date   WBC 8.4 12/20/2023   HGB 12.5 (L) 12/20/2023   HCT 37.4 (L) 12/20/2023   MCV 92 12/20/2023   PLT 390 12/20/2023   Lab Results  Component Value Date   ALT 13 11/18/2023   AST 19 11/18/2023   ALKPHOS 62 11/18/2023   BILITOT 0.8 11/18/2023   Lab Results  Component Value Date   VD25OH 31.19 11/19/2023     Review of Systems  Constitutional:  Negative for chills,  fever and weight loss.  HENT:  Positive for postnasal drip. Negative for ear pain, rhinorrhea and sore throat.   Respiratory:  Positive for cough, shortness of breath and wheezing. Negative for hemoptysis, choking, chest tightness and stridor.   Cardiovascular:  Negative for chest pain, palpitations and leg swelling.  Gastrointestinal:  Negative for heartburn.  Musculoskeletal:  Negative for myalgias.  Skin:  Negative for rash.  Neurological:  Negative for headaches.    Patient Active Problem List   Diagnosis Date Noted   PAD (peripheral artery disease) (HCC) 11/18/2023   Hip fracture (HCC) 11/18/2023   Tibia/fibula fracture 11/17/2023   History of colonic polyps 05/01/2023   Umbilical hernia without obstruction and without gangrene    Benign neoplasm of skin of left forearm 01/27/2019   Pure hypercholesterolemia 06/21/2018   Poorly-controlled hypertension 06/21/2018   Seizure disorder (HCC) 06/21/2018   Hill-Sachs lesion of right shoulder 06/19/2018   Injury of tendon of long head of right biceps 06/19/2018   Rotator cuff tendinitis, right 06/19/2018   Traumatic complete tear of right rotator cuff 06/19/2018   Headache disorder 05/09/2018   CAD (coronary artery disease), native coronary artery 03/28/2018   History of seizures 03/28/2018   Subdural hematoma (HCC) 03/27/2018   Vasovagal syncope 03/27/2018   Old MI (myocardial infarction) 08/30/2017   CAD, multiple vessel 08/01/2017   NSTEMI (non-ST elevated myocardial infarction) (HCC) 07/20/2017  Hyperlipidemia 05/10/2017   Preop cardiovascular exam    Benign neoplasm of ascending colon    Polyp of sigmoid colon    Essential hypertension 03/03/2016    No Known Allergies  Past Surgical History:  Procedure Laterality Date   BICEPT TENODESIS Right 07/18/2018   Procedure: BICEPS TENODESIS;  Surgeon: Elner Hahn, MD;  Location: [facility];  Service: Orthopedics;  Laterality: Right;   CARDIAC CATHETERIZATION     COLONOSCOPY   2011   cleared for 10 yrs   COLONOSCOPY WITH PROPOFOL  N/A 11/06/2016   Procedure: COLONOSCOPY WITH PROPOFOL ;  Surgeon: Marnee Sink, MD;  Location: [facility];  Service: Endoscopy;  Laterality: N/A;   COLONOSCOPY WITH PROPOFOL  N/A 05/01/2023   Procedure: COLONOSCOPY WITH PROPOFOL ;  Surgeon: Marnee Sink, MD;  Location: [facility];  Service: Endoscopy;  Laterality: N/A;   CORONARY STENT INTERVENTION N/A 07/23/2017   Procedure: CORONARY STENT INTERVENTION;  Surgeon: Antonette Batters, MD;  Location: [facility];  Service: Cardiovascular;  Laterality: N/A;   CYST EXCISION     Forearm   INGUINAL HERNIA REPAIR Left    INSERTION OF MESH  05/25/2022   Procedure: INSERTION OF MESH;  Surgeon: Emmalene Hare, MD;  Location: [facility];  Service: General;;   KNEE SURGERY Bilateral    2 on R) 1 on L)   LEFT HEART CATH AND CORONARY ANGIOGRAPHY N/A 07/23/2017   Procedure: LEFT HEART CATH AND CORONARY ANGIOGRAPHY;  Surgeon: Devorah Fonder, MD;  Location: [facility];  Service: Cardiovascular;  Laterality: N/A;   LEFT HEART CATH AND CORONARY ANGIOGRAPHY Left 10/23/2020   Procedure: LEFT HEART CATH AND CORONARY ANGIOGRAPHY; Location: [facility]   POLYPECTOMY  11/06/2016   Procedure: POLYPECTOMY;  Surgeon: Marnee Sink, MD;  Location: [facility];  Service: Endoscopy;;   SHOULDER ARTHROSCOPY WITH OPEN ROTATOR CUFF REPAIR Right 07/18/2018   Procedure: SHOULDER ARTHROSCOPY WITH OPEN ROTATOR CUFF REPAIR;  Surgeon: Elner Hahn, MD;  Location: [facility];  Service: Orthopedics;  Laterality: Right;   TIBIA IM NAIL INSERTION Left 11/19/2023   Procedure: INTRAMEDULLARY (IM) NAIL TIBIAL;  Surgeon: Laneta Pintos, MD;  Location: [facility];  Service: Orthopedics;  Laterality: Left;    Social History   Tobacco Use   Smoking status: Former    Current packs/day: 0.00    Average packs/day: 1 pack/day for 14.0 years (14.0 ttl pk-yrs)    Types: Cigarettes    Start date: 55    Quit date:  6    Years since quitting: 34.3   Smokeless tobacco: Never   Tobacco comments:    quit over 20 yrs ago (~1991)  Vaping Use   Vaping status: Never Used  Substance Use Topics   Alcohol use: Yes    Alcohol/week: 0.0 standard drinks of alcohol    Comment: 1-2 drinks/year   Drug use: No     Medication list has been reviewed and updated.  Current Meds  Medication Sig   acetaminophen  (TYLENOL ) 500 MG tablet Take 2 tablets (1,000 mg total) by mouth every 6 (six) hours as needed for mild pain.   albuterol  (VENTOLIN  HFA) 108 (90 Base) MCG/ACT inhaler Inhale 2 puffs into the lungs every 6 (six) hours as needed for wheezing or shortness of breath.   aspirin  EC 81 MG EC tablet Take 1 tablet (81 mg total) daily by mouth.   atorvastatin  (LIPITOR ) 80 MG tablet TAKE 1 TABLET(80 MG) BY MOUTH DAILY AT 6 PM   carvedilol  (COREG ) 3.125  MG tablet Take 1 tablet (3.125 mg total) by mouth 2 (two) times daily.   cholecalciferol  (CHOLECALCIFEROL ) 25 MCG tablet Take 1 tablet (1,000 Units total) by mouth daily.   citalopram  (CELEXA ) 20 MG tablet Take 20 mg by mouth at bedtime.   clopidogrel  (PLAVIX ) 75 MG tablet    diclofenac  Sodium (VOLTAREN ) 1 % GEL Apply 2 g topically 4 (four) times daily.   donepezil  (ARICEPT ) 5 MG tablet Take 5 mg by mouth at bedtime.   EPINEPHrine  0.3 mg/0.3 mL IJ SOAJ injection Inject 0.3 mg into the muscle as needed for anaphylaxis.   fexofenadine (ALLEGRA) 180 MG tablet Take 180 mg by mouth as needed for allergies or rhinitis. otc   lamoTRIgine  (LAMICTAL ) 200 MG tablet Take 200 mg by mouth 2 (two) times daily.   latanoprost (XALATAN) 0.005 % ophthalmic solution 1 drop at bedtime.   losartan  (COZAAR ) 50 MG tablet Take 1 tablet (50 mg total) by mouth 2 (two) times daily.   methocarbamol  (ROBAXIN ) 500 MG tablet Take 1 tablet (500 mg total) by mouth every 8 (eight) hours as needed for muscle spasms.   nitroGLYCERIN  (NITROSTAT ) 0.4 MG SL tablet PLACE 1 TABLET UNDER THE TONGUE EVERY 5  MINUTES FOR 3 DOSES AS NEEDED FOR CHEST PAIN   nortriptyline (PAMELOR) 10 MG capsule Take 20 mg at night for one month, then decrease to 10 mg at night for one month and then discontinue for post concussion syndrome.   oxyCODONE  (OXY IR/ROXICODONE ) 5 MG immediate release tablet Take 1 tablet (5 mg total) by mouth every 4 (four) hours as needed for severe pain (pain score 7-10).   polyethylene glycol (MIRALAX  / GLYCOLAX ) 17 g packet Take 17 g by mouth daily.   spironolactone  (ALDACTONE ) 25 MG tablet Take 1 tablet (25 mg total) by mouth daily.       02/01/2024   11:22 AM 12/20/2023    2:31 PM 08/30/2023   10:08 AM 06/14/2023    4:06 PM  GAD 7 : Generalized Anxiety Score  Nervous, Anxious, on Edge 0 1 0 0  Control/stop worrying 0 1 0 0  Worry too much - different things 0 1 0 0  Trouble relaxing 0 1 0 0  Restless 0 1 0 0  Easily annoyed or irritable 0 1 0 0  Afraid - awful might happen 0 0 0 0  Total GAD 7 Score 0 6 0 0  Anxiety Difficulty Not difficult at all Somewhat difficult Not difficult at all Not difficult at all       02/01/2024   11:21 AM 12/20/2023    2:31 PM 12/06/2023    4:07 PM  Depression screen PHQ 2/9  Decreased Interest 0 1 1  Down, Depressed, Hopeless 0 1 1  PHQ - 2 Score 0 2 2  Altered sleeping  0 0  Tired, decreased energy  2 1  Change in appetite  1 1  Feeling bad or failure about yourself   0 0  Trouble concentrating  0 0  Moving slowly or fidgety/restless  1 0  Suicidal thoughts  0 0  PHQ-9 Score  6 4  Difficult doing work/chores  Somewhat difficult Somewhat difficult    BP Readings from Last 3 Encounters:  02/01/24 122/64  12/20/23 128/60  11/21/23 (!) 166/80    Physical Exam Vitals and nursing note reviewed.  Constitutional:      Appearance: He is well-developed.  HENT:     Head: Normocephalic and atraumatic.  Right Ear: Tympanic membrane, ear canal and external ear normal.     Left Ear: Tympanic membrane, ear canal and external ear normal.      Nose: Nose normal.     Mouth/Throat:     Dentition: Normal dentition.  Eyes:     General: Lids are normal. No scleral icterus.    Conjunctiva/sclera: Conjunctivae normal.     Pupils: Pupils are equal, round, and reactive to light.  Neck:     Thyroid: No thyromegaly.     Vascular: No carotid bruit, hepatojugular reflux or JVD.     Trachea: No tracheal deviation.  Cardiovascular:     Rate and Rhythm: Normal rate and regular rhythm.     Heart sounds: Normal heart sounds. No murmur heard.    No gallop.  Pulmonary:     Effort: Pulmonary effort is normal. No respiratory distress.     Breath sounds: No stridor. Examination of the right-lower field reveals decreased breath sounds. Decreased breath sounds and wheezing present. No rhonchi or rales.  Chest:     Chest wall: No tenderness.  Abdominal:     General: Bowel sounds are normal.     Palpations: Abdomen is soft. There is no hepatomegaly, splenomegaly or mass.     Tenderness: There is no abdominal tenderness.     Hernia: There is no hernia in the left inguinal area.  Musculoskeletal:        General: Normal range of motion.     Cervical back: Normal range of motion and neck supple.  Lymphadenopathy:     Cervical: No cervical adenopathy.  Skin:    General: Skin is warm and dry.     Findings: No rash.  Neurological:     Mental Status: He is alert and oriented to person, place, and time.     Sensory: No sensory deficit.     Deep Tendon Reflexes: Reflexes are normal and symmetric.  Psychiatric:        Mood and Affect: Mood is not anxious or depressed.     Wt Readings from Last 3 Encounters:  02/01/24 200 lb (90.7 kg)  12/20/23 197 lb 12.8 oz (89.7 kg)  12/06/23 205 lb (93 kg)    BP 122/64   Pulse 80   Temp (!) 97.5 F (36.4 C)   Ht 5\' 9"  (1.753 m)   Wt 200 lb (90.7 kg)   SpO2 97%   BMI 29.53 kg/m   Assessment and Plan:  1. Acute cough (Primary) New onset.  Persistent.  Relatively stable but recurrent coughing  without fever or chills.  This is been going on for over 2 weeks and patient has a history of smoking and we will pursue further evaluation.  On auscultation patient has decreased breath sounds on the right posterior lobe so we will obtain a CBC to note white count and a chest x-ray PA and lateral.  Report on CBC is unremarkable there is no elevation of the white count with no shift and chest x-ray notes a elevated right hemidiaphragm which would account for the decreased breath sounds in the right posterior lobe so for the most part the chest x-ray and is negative but we will continue with current decisions on cough medicine that is promethazine  with DM and azithromycin  250 mg 2 today followed by 1 a day for 4 days. - DG Chest 2 View - CBC with Differential/Platelet -Azithromycin  to 250 milligrams 2. Mild intermittent reactive airway disease without complication Patient has a history of  mild intermittent airway disease.  Patient's been continue his albuterol  2 puffs every 6 hours as that the cough may be due to the reactivity of his airways.    Alayne Allis, MD

## 2024-02-04 ENCOUNTER — Telehealth: Payer: Self-pay

## 2024-02-04 NOTE — Telephone Encounter (Signed)
 Spoke with patient and informed he that Dr. Rochelle Chu said xray was normal. It did show a elevated diaphragm on the right side which is why Dr. Rochelle Chu didn't hear any sound in one area. He verbalized understanding and will continue to take his medication as directed until course is done.   JM

## 2024-02-07 ENCOUNTER — Encounter: Payer: Self-pay | Admitting: Family Medicine

## 2024-02-07 ENCOUNTER — Ambulatory Visit (INDEPENDENT_AMBULATORY_CARE_PROVIDER_SITE_OTHER): Payer: Self-pay | Admitting: Family Medicine

## 2024-02-07 DIAGNOSIS — J4520 Mild intermittent asthma, uncomplicated: Secondary | ICD-10-CM

## 2024-02-07 MED ORDER — ALBUTEROL SULFATE HFA 108 (90 BASE) MCG/ACT IN AERS: 2.0000 | INHALATION_SPRAY | Freq: Four times a day (QID) | RESPIRATORY_TRACT | 2 refills | Status: AC | PRN

## 2024-02-07 NOTE — Progress Notes (Signed)
 Date:  02/07/2024   Name:  George Doyle   DOB:  September 14, 1951   MRN:  [id number]   Chief Complaint: Hyperlipidemia and Hypertension  Hyperlipidemia This is a chronic problem. The current episode started more than 1 year ago. The problem is controlled. Recent lipid tests were reviewed and are normal. He has no history of chronic renal disease, diabetes or obesity. Pertinent negatives include no chest pain, focal sensory loss, focal weakness, leg pain, myalgias or shortness of breath. The current treatment provides moderate improvement of lipids. There are no compliance problems.  There are no known risk factors for coronary artery disease.  Hypertension This is a chronic problem. The current episode started more than 1 year ago. The problem has been gradually improving since onset. The problem is controlled. Pertinent negatives include no chest pain, palpitations or shortness of breath. There is no history of chronic renal disease.  Cough This is a recurrent problem. The current episode started 1 to 4 weeks ago. The problem has been gradually improving. The cough is Non-productive. Associated symptoms include postnasal drip. Pertinent negatives include no chest pain, chills, fever, myalgias, shortness of breath or wheezing. The symptoms are aggravated by lying down.    Lab Results  Component Value Date   NA 134 12/20/2023   K 5.4 (H) 12/20/2023   CO2 22 12/20/2023   GLUCOSE 92 12/20/2023   BUN 19 12/20/2023   CREATININE 1.01 12/20/2023   CALCIUM  10.0 12/20/2023   EGFR 78 12/20/2023   GFRNONAA >60 11/20/2023   Lab Results  Component Value Date   CHOL 111 02/27/2023   HDL 59 02/27/2023   LDLCALC 39 02/27/2023   TRIG 57 02/27/2023   CHOLHDL 2.1 06/11/2018   No results found for: "TSH" No results found for: "HGBA1C" Lab Results  Component Value Date   WBC 9.5 02/01/2024   HGB 12.6 (L) 02/01/2024   HCT 38.3 (L) 02/01/2024   MCV 90.5 02/01/2024   PLT 290 02/01/2024    Lab Results  Component Value Date   ALT 13 11/18/2023   AST 19 11/18/2023   ALKPHOS 62 11/18/2023   BILITOT 0.8 11/18/2023   Lab Results  Component Value Date   VD25OH 31.19 11/19/2023     Review of Systems  Constitutional:  Negative for chills and fever.  HENT:  Positive for postnasal drip.   Eyes:  Negative for visual disturbance.  Respiratory:  Positive for cough. Negative for chest tightness, shortness of breath and wheezing.   Cardiovascular:  Negative for chest pain, palpitations and leg swelling.  Endocrine: Negative for polydipsia, polyphagia and polyuria.  Musculoskeletal:  Negative for myalgias.  Neurological:  Negative for focal weakness.    Patient Active Problem List   Diagnosis Date Noted   PAD (peripheral artery disease) (HCC) 11/18/2023   Hip fracture (HCC) 11/18/2023   Tibia/fibula fracture 11/17/2023   History of colonic polyps 05/01/2023   Umbilical hernia without obstruction and without gangrene    Benign neoplasm of skin of left forearm 01/27/2019   Pure hypercholesterolemia 06/21/2018   Poorly-controlled hypertension 06/21/2018   Seizure disorder (HCC) 06/21/2018   Hill-Sachs lesion of right shoulder 06/19/2018   Injury of tendon of long head of right biceps 06/19/2018   Rotator cuff tendinitis, right 06/19/2018   Traumatic complete tear of right rotator cuff 06/19/2018   Headache disorder 05/09/2018   CAD (coronary artery disease), native coronary artery 03/28/2018   History of seizures 03/28/2018   Subdural hematoma (HCC) 03/27/2018  Vasovagal syncope 03/27/2018   Old MI (myocardial infarction) 08/30/2017   CAD, multiple vessel 08/01/2017   NSTEMI (non-ST elevated myocardial infarction) (HCC) 07/20/2017   Hyperlipidemia 05/10/2017   Preop cardiovascular exam    Benign neoplasm of ascending colon    Polyp of sigmoid colon    Essential hypertension 03/03/2016    No Known Allergies  Past Surgical History:  Procedure Laterality Date    BICEPT TENODESIS Right 07/18/2018   Procedure: BICEPS TENODESIS;  Surgeon: Elner Hahn, MD;  Location: [facility];  Service: Orthopedics;  Laterality: Right;   CARDIAC CATHETERIZATION     COLONOSCOPY  2011   cleared for 10 yrs   COLONOSCOPY WITH PROPOFOL  N/A 11/06/2016   Procedure: COLONOSCOPY WITH PROPOFOL ;  Surgeon: Marnee Sink, MD;  Location: [facility];  Service: Endoscopy;  Laterality: N/A;   COLONOSCOPY WITH PROPOFOL  N/A 05/01/2023   Procedure: COLONOSCOPY WITH PROPOFOL ;  Surgeon: Marnee Sink, MD;  Location: [facility];  Service: Endoscopy;  Laterality: N/A;   CORONARY STENT INTERVENTION N/A 07/23/2017   Procedure: CORONARY STENT INTERVENTION;  Surgeon: Antonette Batters, MD;  Location: [facility];  Service: Cardiovascular;  Laterality: N/A;   CYST EXCISION     Forearm   INGUINAL HERNIA REPAIR Left    INSERTION OF MESH  05/25/2022   Procedure: INSERTION OF MESH;  Surgeon: Emmalene Hare, MD;  Location: [facility];  Service: General;;   KNEE SURGERY Bilateral    2 on R) 1 on L)   LEFT HEART CATH AND CORONARY ANGIOGRAPHY N/A 07/23/2017   Procedure: LEFT HEART CATH AND CORONARY ANGIOGRAPHY;  Surgeon: Devorah Fonder, MD;  Location: [facility];  Service: Cardiovascular;  Laterality: N/A;   LEFT HEART CATH AND CORONARY ANGIOGRAPHY Left 10/23/2020   Procedure: LEFT HEART CATH AND CORONARY ANGIOGRAPHY; Location: [facility]   POLYPECTOMY  11/06/2016   Procedure: POLYPECTOMY;  Surgeon: Marnee Sink, MD;  Location: [facility];  Service: Endoscopy;;   SHOULDER ARTHROSCOPY WITH OPEN ROTATOR CUFF REPAIR Right 07/18/2018   Procedure: SHOULDER ARTHROSCOPY WITH OPEN ROTATOR CUFF REPAIR;  Surgeon: Elner Hahn, MD;  Location: [facility];  Service: Orthopedics;  Laterality: Right;   TIBIA IM NAIL INSERTION Left 11/19/2023   Procedure: INTRAMEDULLARY (IM) NAIL TIBIAL;  Surgeon: Laneta Pintos, MD;  Location: [facility];  Service: Orthopedics;  Laterality: Left;    Social  History   Tobacco Use   Smoking status: Former    Current packs/day: 0.00    Average packs/day: 1 pack/day for 14.0 years (14.0 ttl pk-yrs)    Types: Cigarettes    Start date: 30    Quit date: 54    Years since quitting: 34.4   Smokeless tobacco: Never   Tobacco comments:    quit over 20 yrs ago (~1991)  Vaping Use   Vaping status: Never Used  Substance Use Topics   Alcohol use: Yes    Alcohol/week: 0.0 standard drinks of alcohol    Comment: 1-2 drinks/year   Drug use: No     Medication list has been reviewed and updated.  Current Meds  Medication Sig   acetaminophen  (TYLENOL ) 500 MG tablet Take 2 tablets (1,000 mg total) by mouth every 6 (six) hours as needed for mild pain.   albuterol  (VENTOLIN  HFA) 108 (90 Base) MCG/ACT inhaler Inhale 2 puffs into the lungs every 6 (six) hours as needed for wheezing or shortness of breath.   aspirin  EC 81 MG EC tablet Take 1 tablet (81  mg total) daily by mouth.   atorvastatin  (LIPITOR ) 80 MG tablet TAKE 1 TABLET(80 MG) BY MOUTH DAILY AT 6 PM   carvedilol  (COREG ) 3.125 MG tablet Take 1 tablet (3.125 mg total) by mouth 2 (two) times daily.   cholecalciferol  (CHOLECALCIFEROL ) 25 MCG tablet Take 1 tablet (1,000 Units total) by mouth daily.   citalopram  (CELEXA ) 20 MG tablet Take 20 mg by mouth at bedtime.   clopidogrel  (PLAVIX ) 75 MG tablet    diclofenac  Sodium (VOLTAREN ) 1 % GEL Apply 2 g topically 4 (four) times daily.   donepezil  (ARICEPT ) 5 MG tablet Take 5 mg by mouth at bedtime.   EPINEPHrine  0.3 mg/0.3 mL IJ SOAJ injection Inject 0.3 mg into the muscle as needed for anaphylaxis.   fexofenadine (ALLEGRA) 180 MG tablet Take 180 mg by mouth as needed for allergies or rhinitis. otc   lamoTRIgine  (LAMICTAL ) 200 MG tablet Take 200 mg by mouth 2 (two) times daily.   latanoprost (XALATAN) 0.005 % ophthalmic solution 1 drop at bedtime.   losartan  (COZAAR ) 50 MG tablet Take 1 tablet (50 mg total) by mouth 2 (two) times daily.   methocarbamol   (ROBAXIN ) 500 MG tablet Take 1 tablet (500 mg total) by mouth every 8 (eight) hours as needed for muscle spasms.   nitroGLYCERIN  (NITROSTAT ) 0.4 MG SL tablet PLACE 1 TABLET UNDER THE TONGUE EVERY 5 MINUTES FOR 3 DOSES AS NEEDED FOR CHEST PAIN   nortriptyline (PAMELOR) 10 MG capsule Take 20 mg at night for one month, then decrease to 10 mg at night for one month and then discontinue for post concussion syndrome.   oxyCODONE  (OXY IR/ROXICODONE ) 5 MG immediate release tablet Take 1 tablet (5 mg total) by mouth every 4 (four) hours as needed for severe pain (pain score 7-10).   polyethylene glycol (MIRALAX  / GLYCOLAX ) 17 g packet Take 17 g by mouth daily.   promethazine -dextromethorphan (PROMETHAZINE -DM) 6.25-15 MG/5ML syrup Take 5 mLs by mouth 4 (four) times daily as needed.   spironolactone  (ALDACTONE ) 25 MG tablet Take 1 tablet (25 mg total) by mouth daily.       02/07/2024    9:57 AM 02/01/2024   11:22 AM 12/20/2023    2:31 PM 08/30/2023   10:08 AM  GAD 7 : Generalized Anxiety Score  Nervous, Anxious, on Edge 0 0 1 0  Control/stop worrying 0 0 1 0  Worry too much - different things 0 0 1 0  Trouble relaxing 0 0 1 0  Restless 0 0 1 0  Easily annoyed or irritable 0 0 1 0  Afraid - awful might happen 0 0 0 0  Total GAD 7 Score 0 0 6 0  Anxiety Difficulty Not difficult at all Not difficult at all Somewhat difficult Not difficult at all       02/07/2024    9:57 AM 02/01/2024   11:21 AM 12/20/2023    2:31 PM  Depression screen PHQ 2/9  Decreased Interest 0 0 1  Down, Depressed, Hopeless 0 0 1  PHQ - 2 Score 0 0 2  Altered sleeping 0  0  Tired, decreased energy 0  2  Change in appetite 0  1  Feeling bad or failure about yourself  0  0  Trouble concentrating 0  0  Moving slowly or fidgety/restless 0  1  Suicidal thoughts 0  0  PHQ-9 Score 0  6  Difficult doing work/chores Not difficult at all  Somewhat difficult    BP Readings from Last  3 Encounters:  02/07/24 (!) 140/82  02/01/24  122/64  12/20/23 128/60    Physical Exam Vitals and nursing note reviewed.  HENT:     Head: Normocephalic.     Right Ear: Tympanic membrane and ear canal normal.     Left Ear: Tympanic membrane and ear canal normal.     Nose: Nose normal. No congestion or rhinorrhea.     Mouth/Throat:     Mouth: Mucous membranes are moist.     Pharynx: No oropharyngeal exudate or posterior oropharyngeal erythema.  Cardiovascular:     Heart sounds: No murmur heard.    No friction rub. No gallop.  Pulmonary:     Effort: No respiratory distress.     Breath sounds: No wheezing, rhonchi or rales.  Abdominal:     General: Abdomen is flat. There is no distension.  Neurological:     Mental Status: He is alert.     Wt Readings from Last 3 Encounters:  02/07/24 199 lb 4 oz (90.4 kg)  02/01/24 200 lb (90.7 kg)  12/20/23 197 lb 12.8 oz (89.7 kg)    BP (!) 140/82   Pulse 73   Ht 5\' 9"  (1.753 m)   Wt 199 lb 4 oz (90.4 kg)   SpO2 96%   BMI 29.42 kg/m   Assessment and Plan:  1. Mild intermittent reactive airway disease without complication Chronic.  Episodic.  Currently stable on albuterol  will continue at current dosing. - albuterol  (VENTOLIN  HFA) 108 (90 Base) MCG/ACT inhaler; Inhale 2 puffs into the lungs every 6 (six) hours as needed for wheezing or shortness of breath.  Dispense: 8 g; Refill: 2   Patient is followed by Dr. Gollan for cholesterol and hypertension and atorvastatin  and losartan  will be continued per his decision.  Otherwise patient is doing well Alayne Allis, MD

## 2024-02-12 ENCOUNTER — Telehealth: Payer: Self-pay

## 2024-02-12 NOTE — Telephone Encounter (Signed)
 Copied from CRM [id number]. Topic: General - Other >> Feb 12, 2024 11:55 AM Essie A wrote: Reason for CRM: Patient's wife, Sheryle Donning, called to find out what dermatologist was recommended by Dr. Rochelle Chu, in patient's last office visit.  Nothing was in patient's chart.  Please return her call at [phone].  She was going to call her dermatologist but needed to know if she needs a referral.

## 2024-02-18 NOTE — Telephone Encounter (Signed)
 LMOM for patient to call back.

## 2024-02-18 NOTE — Telephone Encounter (Signed)
 Spoke with George Doyle and informed her of note above. She verbalized understanding.Aaron Aas  JM

## 2024-03-03 ENCOUNTER — Other Ambulatory Visit: Payer: Self-pay | Admitting: Cardiovascular Disease

## 2024-03-03 DIAGNOSIS — I10 Essential (primary) hypertension: Secondary | ICD-10-CM

## 2024-03-06 ENCOUNTER — Other Ambulatory Visit (INDEPENDENT_AMBULATORY_CARE_PROVIDER_SITE_OTHER): Payer: Self-pay | Admitting: Student

## 2024-03-06 DIAGNOSIS — I739 Peripheral vascular disease, unspecified: Secondary | ICD-10-CM

## 2024-03-07 ENCOUNTER — Ambulatory Visit (INDEPENDENT_AMBULATORY_CARE_PROVIDER_SITE_OTHER): Payer: Self-pay

## 2024-03-07 DIAGNOSIS — I739 Peripheral vascular disease, unspecified: Secondary | ICD-10-CM

## 2024-03-09 ENCOUNTER — Other Ambulatory Visit: Payer: Self-pay | Admitting: Cardiovascular Disease

## 2024-03-10 ENCOUNTER — Telehealth: Payer: Self-pay | Admitting: Cardiovascular Disease

## 2024-03-10 MED ORDER — SPIRONOLACTONE 25 MG PO TABS: 25.0000 mg | ORAL_TABLET | Freq: Every day | ORAL | 2 refills | Status: DC

## 2024-03-10 NOTE — Telephone Encounter (Signed)
*  STAT* If patient is at the pharmacy, call can be transferred to refill team.   1. Which medications need to be refilled? (please list name of each medication and dose if known) spironolactone  (ALDACTONE ) 25 MG tablet  2. Which pharmacy/location (including street and city if local pharmacy) is medication to be sent to? [id number] - [place], [place] - [address]   3. Do they need a 30 day or 90 day supply?  90 day supply  Patient is completely out of medication.

## 2024-03-10 NOTE — Telephone Encounter (Signed)
 RX sent to requested Pharmacy

## 2024-03-12 LAB — VAS US ABI WITH/WO TBI
Left ABI: 0.69
Right ABI: 1.21

## 2024-03-29 ENCOUNTER — Other Ambulatory Visit: Payer: Self-pay | Admitting: Cardiovascular Disease

## 2024-04-03 ENCOUNTER — Other Ambulatory Visit: Payer: Self-pay | Admitting: Family Medicine

## 2024-04-03 ENCOUNTER — Ambulatory Visit (INDEPENDENT_AMBULATORY_CARE_PROVIDER_SITE_OTHER): Admitting: Vascular Surgery

## 2024-04-03 ENCOUNTER — Encounter (INDEPENDENT_AMBULATORY_CARE_PROVIDER_SITE_OTHER): Payer: Self-pay | Admitting: Vascular Surgery

## 2024-04-03 VITALS — BP 160/72 | HR 51 | Resp 18 | Ht 69.0 in | Wt 203.8 lb

## 2024-04-03 DIAGNOSIS — E785 Hyperlipidemia, unspecified: Secondary | ICD-10-CM

## 2024-04-03 DIAGNOSIS — I70212 Atherosclerosis of native arteries of extremities with intermittent claudication, left leg: Secondary | ICD-10-CM

## 2024-04-03 DIAGNOSIS — I10 Essential (primary) hypertension: Secondary | ICD-10-CM | POA: Diagnosis not present

## 2024-04-03 DIAGNOSIS — E782 Mixed hyperlipidemia: Secondary | ICD-10-CM | POA: Diagnosis not present

## 2024-04-03 NOTE — Telephone Encounter (Unsigned)
 Copied from CRM [id number]. Topic: Clinical - Medication Refill >> Apr 03, 2024  2:00 PM Emylou G wrote: Medication: atorvastatin  (LIPITOR ) 80 MG tablet  Has the patient contacted their pharmacy? Yes (Agent: If no, request that the patient contact the pharmacy for the refill. If patient does not wish to contact the pharmacy document the reason why and proceed with request.) (Agent: If yes, when and what did the pharmacy advise?) said to call us   This is the patient's preferred pharmacy:  [id number] - [place], [place] - [address] [place] [address] Phone: [phone] Fax: [phone]    Is this the correct pharmacy for this prescription? No If no, delete pharmacy and type the correct one.   Has the prescription been filled recently? Yes  Is the patient out of the medication? Yes  Has the patient been seen for an appointment in the last year OR does the patient have an upcoming appointment? Yes  Can we respond through MyChart? Yes  Agent: Please be advised that Rx refills may take up to 3 business days. We ask that you follow-up with your pharmacy.

## 2024-04-03 NOTE — Progress Notes (Signed)
 Subjective:    Patient ID: George Doyle, male    DOB: [birth year], 73 y.o.   MRN: [id number] Chief Complaint  Patient presents with   Follow-up   Establish Care    New patient consult ABI done 03/07/24 ref. Haddix    George Doyle is a 73 yo male who presents to clinic today with chief complaint of left lower extremity pain.  Patient states he is having pain to his left lower extremity that starts in his hip 3 times thigh down to his toes at rest and with exercise.  Patient states it is interfering with his daily activities and his ability to ambulate.  Patient endorses the more he ambulates the worst the pain gets.  He does endorse the pain will relieve itself over time if he sits down or rests.  Denies any swelling to his lower extremity.  Denies any open sores or wounds to his left lower extremity today.    Review of Systems  Constitutional: Negative.   Musculoskeletal:  Positive for gait problem.       Left lower extremity pain at rest with ambulation.  All other systems reviewed and are negative.      Objective:   Physical Exam Constitutional:      Appearance: Normal appearance. He is obese.  HENT:     Head: Normocephalic.  Eyes:     Pupils: Pupils are equal, round, and reactive to light.  Cardiovascular:     Rate and Rhythm: Normal rate and regular rhythm.     Pulses: Normal pulses.     Heart sounds: Normal heart sounds.  Pulmonary:     Effort: Pulmonary effort is normal.     Breath sounds: Normal breath sounds.  Abdominal:     General: Bowel sounds are normal.     Palpations: Abdomen is soft.  Musculoskeletal:        General: Tenderness present.  Skin:    General: Skin is warm and dry.     Capillary Refill: Capillary refill takes 2 to 3 seconds.  Neurological:     General: No focal deficit present.     Mental Status: He is alert and oriented to person, place, and time. Mental status is at baseline.  Psychiatric:        Mood and Affect: Mood  normal.        Behavior: Behavior normal.        Thought Content: Thought content normal.        Judgment: Judgment normal.     BP (!) 160/72 (BP Location: Left Arm, Patient Position: Sitting, Cuff Size: Normal)   Pulse (!) 51   Resp 18   Ht 5' 9 (1.753 m)   Wt 203 lb 12.8 oz (92.4 kg)   BMI 30.10 kg/m   Past Medical History:  Diagnosis Date   Allergy    Arthritis    knees, hands   Ascending aorta dilatation (HCC) 07/21/2017   a.) TTE 07/21/2017: Ao root 36 mm, asc Ao 41 mm; b.) TTE 10/22/2020: asc Ao 39 mm   Coronary artery disease    Diastolic dysfunction 07/21/2017   a.) TTE 07/21/2017 (following NSTEMI): EF 50-55%, inf HK, mild MR, G1DD   Dyspnea    Former tobacco use    History of 2019 novel coronavirus disease (COVID-19) 10/21/2020   HLD (hyperlipidemia)    Hypertension    NSTEMI (non-ST elevated myocardial infarction) (HCC) 07/20/2017   a.) CP started 5 days prior to presentation (approx 07/15/2017);  b.) LHC 07/23/2017: 100% p-d RCA, 60% p-mLCx (tortuous), 99% post atrio --> unsuccessful PCI; c.) referred to interventionist Laverna) in [place] on 08/30/2017 --> med mgmt recommended   NSTEMI (non-ST elevated myocardial infarction) (HCC) 10/23/2020   a.) Troponins trended: 307-->1304--> 6774 --> 6895 --> 5886 --> 5324; b.) LHC 10/23/2020: EF >55%; 40% pLCx, CTO pRCA --> med mgmt.   Pneumonia    SDH (subdural hematoma) (HCC) 03/27/2018   a.) 6 mm frontal SDH s/p fall related to tonic-clonic seizure   Seizures (HCC) 1970   a.) last documented seizure was in 10/2020   Umbilical hernia    Vasovagal syncope     Social History   Socioeconomic History   Marital status: Married    Spouse name: Niels   Number of children: 2   Years of education: Not on file   Highest education level: Not on file  Occupational History   Occupation: retired  Tobacco Use   Smoking status: Former    Current packs/day: 0.00    Average packs/day: 1 pack/day for 14.0 years (14.0 ttl  pk-yrs)    Types: Cigarettes    Start date: 5    Quit date: 1991    Years since quitting: 34.5   Smokeless tobacco: Never   Tobacco comments:    quit over 20 yrs ago (~1991)  Vaping Use   Vaping status: Never Used  Substance and Sexual Activity   Alcohol use: Yes    Alcohol/week: 0.0 standard drinks of alcohol    Comment: 1-2 drinks/year   Drug use: No   Sexual activity: Not Currently  Other Topics Concern   Not on file  Social History Narrative   Not on file   Social Drivers of Health   Financial Resource Strain: Low Risk  (12/06/2023)   Overall Financial Resource Strain (CARDIA)    Difficulty of Paying Living Expenses: Not hard at all  Food Insecurity: No Food Insecurity (12/06/2023)   Hunger Vital Sign    Worried About Running Out of Food in the Last Year: Never true    Ran Out of Food in the Last Year: Never true  Transportation Needs: No Transportation Needs (12/06/2023)   PRAPARE - Administrator, Civil Service (Medical): No    Lack of Transportation (Non-Medical): No  Physical Activity: Inactive (12/06/2023)   Exercise Vital Sign    Days of Exercise per Week: 0 days    Minutes of Exercise per Session: 0 min  Stress: No Stress Concern Present (12/06/2023)   [employer] of Occupational Health - Occupational Stress Questionnaire    Feeling of Stress : Only a little  Social Connections: Moderately Integrated (12/06/2023)   Social Connection and Isolation Panel    Frequency of Communication with Friends and Family: More than three times a week    Frequency of Social Gatherings with Friends and Family: More than three times a week    Attends Religious Services: 1 to 4 times per year    Active Member of [employer] or Organizations: No    Attends Banker Meetings: Never    Marital Status: Married  Recent Concern: Social Connections - Moderately Isolated (11/18/2023)   Social Connection and Isolation Panel    Frequency of Communication with Friends  and Family: Once a week    Frequency of Social Gatherings with Friends and Family: More than three times a week    Attends Religious Services: Never    Database administrator or Organizations: No  Attends Banker Meetings: Never    Marital Status: Married  Catering manager Violence: Not At Risk (12/06/2023)   Humiliation, Afraid, Rape, and Kick questionnaire    Fear of Current or Ex-Partner: No    Emotionally Abused: No    Physically Abused: No    Sexually Abused: No    Past Surgical History:  Procedure Laterality Date   BICEPT TENODESIS Right 07/18/2018   Procedure: BICEPS TENODESIS;  Surgeon: Edie Norleen PARAS, MD;  Location: [facility];  Service: Orthopedics;  Laterality: Right;   CARDIAC CATHETERIZATION     COLONOSCOPY  2011   cleared for 10 yrs   COLONOSCOPY WITH PROPOFOL  N/A 11/06/2016   Procedure: COLONOSCOPY WITH PROPOFOL ;  Surgeon: Rogelia Copping, MD;  Location: [facility];  Service: Endoscopy;  Laterality: N/A;   COLONOSCOPY WITH PROPOFOL  N/A 05/01/2023   Procedure: COLONOSCOPY WITH PROPOFOL ;  Surgeon: Copping Rogelia, MD;  Location: [facility];  Service: Endoscopy;  Laterality: N/A;   CORONARY STENT INTERVENTION N/A 07/23/2017   Procedure: CORONARY STENT INTERVENTION;  Surgeon: Florencio Cara BIRCH, MD;  Location: [facility];  Service: Cardiovascular;  Laterality: N/A;   CYST EXCISION     Forearm   INGUINAL HERNIA REPAIR Left    INSERTION OF MESH  05/25/2022   Procedure: INSERTION OF MESH;  Surgeon: Desiderio Schanz, MD;  Location: [facility];  Service: General;;   KNEE SURGERY Bilateral    2 on R) 1 on L)   LEFT HEART CATH AND CORONARY ANGIOGRAPHY N/A 07/23/2017   Procedure: LEFT HEART CATH AND CORONARY ANGIOGRAPHY;  Surgeon: Perla Evalene PARAS, MD;  Location: [facility];  Service: Cardiovascular;  Laterality: N/A;   LEFT HEART CATH AND CORONARY ANGIOGRAPHY Left 10/23/2020   Procedure: LEFT HEART CATH AND CORONARY ANGIOGRAPHY; Location: [facility]    POLYPECTOMY  11/06/2016   Procedure: POLYPECTOMY;  Surgeon: Rogelia Copping, MD;  Location: [facility];  Service: Endoscopy;;   SHOULDER ARTHROSCOPY WITH OPEN ROTATOR CUFF REPAIR Right 07/18/2018   Procedure: SHOULDER ARTHROSCOPY WITH OPEN ROTATOR CUFF REPAIR;  Surgeon: Edie Norleen PARAS, MD;  Location: [facility];  Service: Orthopedics;  Laterality: Right;   TIBIA IM NAIL INSERTION Left 11/19/2023   Procedure: INTRAMEDULLARY (IM) NAIL TIBIAL;  Surgeon: Kendal Franky SQUIBB, MD;  Location: [facility];  Service: Orthopedics;  Laterality: Left;    Family History  Problem Relation Age of Onset   CAD Father    Lung cancer Father    Heart failure Mother     No Known Allergies     Latest Ref Rng & Units 02/01/2024   11:55 AM 12/20/2023    3:04 PM 11/21/2023    6:14 AM  CBC  WBC 4.0 - 10.5 K/uL 9.5  8.4  9.7   Hemoglobin 13.0 - 17.0 g/dL 87.3  87.4  89.4   Hematocrit 39.0 - 52.0 % 38.3  37.4  31.1   Platelets 150 - 400 K/uL 290  390  175       CMP     Component Value Date/Time   NA 134 12/20/2023 1504   K 5.4 (H) 12/20/2023 1504   CL 98 12/20/2023 1504   CO2 22 12/20/2023 1504   GLUCOSE 92 12/20/2023 1504   GLUCOSE 117 (H) 11/20/2023 0811   BUN 19 12/20/2023 1504   CREATININE 1.01 12/20/2023 1504   CALCIUM  10.0 12/20/2023 1504   PROT 6.0 (L) 11/18/2023 0511   PROT 6.4 02/27/2023 1125   ALBUMIN 3.5 11/18/2023 0511  ALBUMIN 4.2 02/27/2023 1125   AST 19 11/18/2023 0511   ALT 13 11/18/2023 0511   ALKPHOS 62 11/18/2023 0511   BILITOT 0.8 11/18/2023 0511   BILITOT 0.8 02/27/2023 1125   EGFR 78 12/20/2023 1504   GFRNONAA >60 11/20/2023 0811     VAS US  ABI WITH/WO TBI Result Date: 03/12/2024  LOWER EXTREMITY DOPPLER STUDY Patient Name:  Joshiah Traynham  Date of Exam:   03/07/2024 Medical Rec #: [id number]                 Accession #:    [id number] Date of Birth: August 12, 1951                Patient Gender: M Patient Age:   89 years Exam Location:  [facility] Procedure:       VAS US  ABI WITH/WO TBI Referring Phys: Selinda Gu --------------------------------------------------------------------------------  Indications: Rest pain.  Performing Technologist: Leafy Gibes RVS  Examination Guidelines: A complete evaluation includes at minimum, Doppler waveform signals and systolic blood pressure reading at the level of bilateral brachial, anterior tibial, and posterior tibial arteries, when vessel segments are accessible. Bilateral testing is considered an integral part of a complete examination. Photoelectric Plethysmograph (PPG) waveforms and toe systolic pressure readings are included as required and additional duplex testing as needed. Limited examinations for reoccurring indications may be performed as noted.  ABI Findings: +---------+------------------+-----+---------+--------+ Right    Rt Pressure (mmHg)IndexWaveform Comment  +---------+------------------+-----+---------+--------+ Brachial 157                                      +---------+------------------+-----+---------+--------+ ATA      159               1.01 triphasic         +---------+------------------+-----+---------+--------+ PTA      190               1.21 triphasic         +---------+------------------+-----+---------+--------+ Great Toe157               1.00 Normal            +---------+------------------+-----+---------+--------+ +---------+------------------+-----+--------+-------+ Left     Lt Pressure (mmHg)IndexWaveformComment +---------+------------------+-----+--------+-------+ Brachial 156                                    +---------+------------------+-----+--------+-------+ ATA      106               0.68 biphasic        +---------+------------------+-----+--------+-------+ PTA      108               0.69 biphasic        +---------+------------------+-----+--------+-------+ Great Toe98                0.62 Abnormal         +---------+------------------+-----+--------+-------+ +-------+-----------+-----------+------------+------------+ ABI/TBIToday's ABIToday's TBIPrevious ABIPrevious TBI +-------+-----------+-----------+------------+------------+ Right  1.21       1.00                                +-------+-----------+-----------+------------+------------+ Left   .69        .62                                 +-------+-----------+-----------+------------+------------+  Summary: Right: Resting right ankle-brachial index is within normal range. The right toe-brachial index is normal. Left: Resting left ankle-brachial index indicates moderate left lower extremity arterial disease. The left toe-brachial index is abnormal. *See table(s) above for measurements and observations.  Electronically signed by Selinda Gu MD on 03/12/2024 at 8:15:07 AM.    Final        Assessment & Plan:   1. Atherosclerosis of native artery of left lower extremity with intermittent claudication (HCC) (Primary) Recommend:  The patient has experienced increased claudication symptoms and is now describing lifestyle limiting claudication and appears to be having mild rest pain symptroms.  Given the severity of the patient's severe left lower extremity symptoms the patient should undergo angiography with the hope for intervention.  Risk and benefits were reviewed the patient.  Indications for the procedure were reviewed.  All questions were answered, the patient agrees to proceed with left lower extremity angiography and possible intervention.   The patient should continue walking and begin a more formal exercise program.  The patient should continue antiplatelet therapy and aggressive treatment of the lipid abnormalities  The patient will follow up with me after the angiogram.    2. Essential hypertension Continue antihypertensive medications as already ordered, these medications have been reviewed and there are no changes at this  time.  3. Mixed hyperlipidemia Continue statin as ordered and reviewed, no changes at this time   Current Outpatient Medications on File Prior to Visit  Medication Sig Dispense Refill   acetaminophen  (TYLENOL ) 500 MG tablet Take 2 tablets (1,000 mg total) by mouth every 6 (six) hours as needed for mild pain.     albuterol  (VENTOLIN  HFA) 108 (90 Base) MCG/ACT inhaler Inhale 2 puffs into the lungs every 6 (six) hours as needed for wheezing or shortness of breath. 8 g 2   aspirin  EC 81 MG EC tablet Take 1 tablet (81 mg total) daily by mouth. 30 tablet 1   atorvastatin  (LIPITOR ) 80 MG tablet TAKE 1 TABLET(80 MG) BY MOUTH DAILY AT 6 PM 90 tablet 3   carvedilol  (COREG ) 3.125 MG tablet TAKE 1 TABLET BY MOUTH 2 TIMES DAILY. 180 tablet 2   citalopram  (CELEXA ) 20 MG tablet Take 20 mg by mouth at bedtime.     clopidogrel  (PLAVIX ) 75 MG tablet      diclofenac  Sodium (VOLTAREN ) 1 % GEL Apply 2 g topically 4 (four) times daily.     donepezil  (ARICEPT ) 5 MG tablet Take 5 mg by mouth at bedtime.     EPINEPHrine  0.3 mg/0.3 mL IJ SOAJ injection Inject 0.3 mg into the muscle as needed for anaphylaxis. 1 each 1   fexofenadine (ALLEGRA) 180 MG tablet Take 180 mg by mouth as needed for allergies or rhinitis. otc     HYDROcodone-acetaminophen  (NORCO/VICODIN) 5-325 MG tablet Take 1 tablet by mouth every 8 (eight) hours as needed.     lamoTRIgine  (LAMICTAL ) 200 MG tablet Take 200 mg by mouth 2 (two) times daily.     latanoprost (XALATAN) 0.005 % ophthalmic solution 1 drop at bedtime.     losartan  (COZAAR ) 50 MG tablet TAKE 1 TABLET BY MOUTH TWICE A DAY 180 tablet 2   methocarbamol  (ROBAXIN ) 500 MG tablet Take 1 tablet (500 mg total) by mouth every 8 (eight) hours as needed for muscle spasms. 20 tablet 0   nitroGLYCERIN  (NITROSTAT ) 0.4 MG SL tablet PLACE 1 TABLET UNDER THE TONGUE EVERY 5 MINUTES FOR 3 DOSES AS NEEDED FOR CHEST PAIN 25 tablet  0   nortriptyline (PAMELOR) 10 MG capsule Take 20 mg at night for one month,  then decrease to 10 mg at night for one month and then discontinue for post concussion syndrome.     oxyCODONE  (OXY IR/ROXICODONE ) 5 MG immediate release tablet Take 1 tablet (5 mg total) by mouth every 4 (four) hours as needed for severe pain (pain score 7-10). 30 tablet 0   polyethylene glycol (MIRALAX  / GLYCOLAX ) 17 g packet Take 17 g by mouth daily.     promethazine -dextromethorphan (PROMETHAZINE -DM) 6.25-15 MG/5ML syrup Take 5 mLs by mouth 4 (four) times daily as needed. 118 mL 0   spironolactone  (ALDACTONE ) 25 MG tablet Take 1 tablet (25 mg total) by mouth daily. 90 tablet 2   No current facility-administered medications on file prior to visit.    There are no Patient Instructions on file for this visit. No follow-ups on file.   Gwendlyn JONELLE Shank, NP

## 2024-04-04 NOTE — Telephone Encounter (Signed)
 Requested medications are due for refill today.  yes  Requested medications are on the active medications list.  yes  Last refill. 03/05/2023 #90 3 rf  Future visit scheduled.   yes  Notes to clinic.  Labs are expired.     Requested Prescriptions  Pending Prescriptions Disp Refills   atorvastatin  (LIPITOR ) 80 MG tablet 90 tablet 3    Sig: TAKE 1 TABLET(80 MG) BY MOUTH DAILY AT 6 PM     Cardiovascular:  Antilipid - Statins Failed - 04/04/2024  2:01 PM      Failed - Lipid Panel in normal range within the last 12 months    Cholesterol, Total  Date Value Ref Range Status  02/27/2023 111 100 - 199 mg/dL Final   LDL Chol Calc (NIH)  Date Value Ref Range Status  02/27/2023 39 0 - 99 mg/dL Final   HDL  Date Value Ref Range Status  02/27/2023 59 >39 mg/dL Final   Triglycerides  Date Value Ref Range Status  02/27/2023 57 0 - 149 mg/dL Final         Passed - Patient is not pregnant      Passed - Valid encounter within last 12 months    Recent Outpatient Visits           1 month ago Mild intermittent reactive airway disease without complication   [facility] Cathryne JAYSON, MD   2 months ago Acute cough   [facility]ne JA[facility]ON, MD   3 months ago Other iron deficiency anemia   [facility]ne JAYSON, MD       Future Appointments             In 4 months Kotturi, Vinay K, MD [facility] at [facility], [facility]

## 2024-04-11 ENCOUNTER — Other Ambulatory Visit: Payer: Self-pay | Admitting: Cardiovascular Disease

## 2024-04-11 ENCOUNTER — Telehealth: Payer: Self-pay

## 2024-04-11 DIAGNOSIS — E785 Hyperlipidemia, unspecified: Secondary | ICD-10-CM

## 2024-04-11 NOTE — Telephone Encounter (Signed)
 Copied from CRM [id number]. Topic: Clinical - Medication Refill >> Apr 03, 2024  2:00 PM Emylou G wrote: Medication: atorvastatin  (LIPITOR ) 80 MG tablet  Has the patient contacted their pharmacy? Yes (Agent: If no, request that the patient contact the pharmacy for the refill. If patient does not wish to contact the pharmacy document the reason why and proceed with request.) (Agent: If yes, when and what did the pharmacy advise?) said to call us   This is the patient's preferred pharmacy:  [id number] - [place], [place] - [address] [place] [address] Phone: [phone] Fax: [phone]    Is this the correct pharmacy for this prescription? No If no, delete pharmacy and type the correct one.   Has the prescription been filled recently? Yes  Is the patient out of the medication? Yes  Has the patient been seen for an appointment in the last year OR does the patient have an upcoming appointment? Yes  Can we respond through MyChart? Yes  Agent: Please be advised that Rx refills may take up to 3 business days. We ask that you follow-up with your pharmacy. >> Apr 11, 2024  9:13 AM Gustabo D wrote: Patient's wife is calling to see why the medication hasn't been sent to the pharmacy it says refused-atorvastatin  (LIPITOR ) 80 MG tablet [[id number]]  Please call back [phone]

## 2024-04-11 NOTE — Telephone Encounter (Signed)
 Called pts wife let her know the medication was filled by Cardiologist Dr. Gollan. She verbalized understanding. She will reach out to Dr. Merlinda office.  KP

## 2024-04-15 ENCOUNTER — Telehealth (INDEPENDENT_AMBULATORY_CARE_PROVIDER_SITE_OTHER): Payer: Self-pay

## 2024-04-15 NOTE — Telephone Encounter (Signed)
 I attempted to contact the patient to schedule him for a LLE angio possible intervention with Dr. Jama. A message was left for a return call.

## 2024-04-16 ENCOUNTER — Telehealth (INDEPENDENT_AMBULATORY_CARE_PROVIDER_SITE_OTHER): Payer: Self-pay

## 2024-04-16 NOTE — Telephone Encounter (Signed)
 Patient returned my call and is scheduled with Dr. Jama for a LLE angio with poss intervention on 05/13/24 with a 6:45 am arrival time to the [facility]. Pre-procedure instructions were discussed and will be sent to Mychart and mailed. 05/06/24 was offered and declined

## 2024-05-13 ENCOUNTER — Encounter: Payer: Self-pay | Admitting: Vascular Surgery

## 2024-05-13 ENCOUNTER — Ambulatory Visit
Admission: RE | Admit: 2024-05-13 | Discharge: 2024-05-13 | Disposition: A | Discharge status: Home / Self Care | Attending: Vascular Surgery | Admitting: Vascular Surgery

## 2024-05-13 ENCOUNTER — Other Ambulatory Visit: Payer: Self-pay

## 2024-05-13 ENCOUNTER — Encounter
Admission: RE | Disposition: A | Discharge status: Home / Self Care | Payer: Self-pay | Source: Home / Self Care | Attending: Vascular Surgery

## 2024-05-13 DIAGNOSIS — I10 Essential (primary) hypertension: Secondary | ICD-10-CM | POA: Diagnosis not present

## 2024-05-13 DIAGNOSIS — E782 Mixed hyperlipidemia: Secondary | ICD-10-CM | POA: Insufficient documentation

## 2024-05-13 DIAGNOSIS — I70299 Other atherosclerosis of native arteries of extremities, unspecified extremity: Secondary | ICD-10-CM

## 2024-05-13 DIAGNOSIS — I70222 Atherosclerosis of native arteries of extremities with rest pain, left leg: Secondary | ICD-10-CM

## 2024-05-13 DIAGNOSIS — Z87891 Personal history of nicotine dependence: Secondary | ICD-10-CM | POA: Diagnosis not present

## 2024-05-13 DIAGNOSIS — G40909 Epilepsy, unspecified, not intractable, without status epilepticus: Secondary | ICD-10-CM

## 2024-05-13 DIAGNOSIS — L97909 Non-pressure chronic ulcer of unspecified part of unspecified lower leg with unspecified severity: Secondary | ICD-10-CM

## 2024-05-13 HISTORY — PX: LOWER EXTREMITY ANGIOGRAPHY: CATH118251

## 2024-05-13 LAB — CREATININE, SERUM
Creatinine, Ser: 1.09 mg/dL (ref 0.61–1.24)
GFR, Estimated: >60 mL/min (ref >60)

## 2024-05-13 LAB — BUN: BUN: 22 mg/dL (ref 8–23)

## 2024-05-13 SURGERY — LOWER EXTREMITY ANGIOGRAPHY
Anesthesia: Moderate Sedation | Site: Leg Lower | Laterality: Left

## 2024-05-13 MED ORDER — ACETAMINOPHEN 325 MG PO TABS
650.0000 mg | ORAL_TABLET | ORAL | Status: DC | PRN
Start: 2024-05-13 — End: 2024-05-13

## 2024-05-13 MED ORDER — CLOPIDOGREL BISULFATE 75 MG PO TABS: 75.0000 mg | ORAL_TABLET | Freq: Every day | ORAL | 5 refills | Status: DC

## 2024-05-13 MED ORDER — MIDAZOLAM HCL 2 MG/ML PO SYRP: 8.0000 mg | ORAL_SOLUTION | Freq: Once | ORAL | Status: DC | PRN

## 2024-05-13 MED ORDER — SODIUM CHLORIDE 0.9 % IV SOLN: INTRAVENOUS | Status: DC

## 2024-05-13 MED ORDER — SODIUM CHLORIDE 0.9% FLUSH: 3.0000 mL | INTRAVENOUS | Status: DC | PRN

## 2024-05-13 MED ORDER — LABETALOL HCL 5 MG/ML IV SOLN: 10.0000 mg | INTRAVENOUS | Status: DC | PRN

## 2024-05-13 MED ORDER — HYDROMORPHONE HCL 1 MG/ML IJ SOLN: 1.0000 mg | Freq: Once | INTRAMUSCULAR | Status: DC | PRN

## 2024-05-13 MED ORDER — FAMOTIDINE 20 MG PO TABS: 40.0000 mg | ORAL_TABLET | Freq: Once | ORAL | Status: DC | PRN

## 2024-05-13 MED ORDER — CEFAZOLIN SODIUM-DEXTROSE 2-4 GM/100ML-% IV SOLN
2.0000 g | INTRAVENOUS | Status: AC
  Administered 2024-05-13: 2 g via INTRAVENOUS

## 2024-05-13 MED ORDER — MORPHINE SULFATE (PF) 2 MG/ML IV SOLN: 2.0000 mg | INTRAVENOUS | Status: DC | PRN

## 2024-05-13 MED ORDER — MIDAZOLAM HCL 2 MG/2ML IJ SOLN
INTRAMUSCULAR | Status: DC | PRN
  Administered 2024-05-13: 1 mg via INTRAVENOUS
  Administered 2024-05-13: 2 mg via INTRAVENOUS
  Administered 2024-05-13: 1 mg via INTRAVENOUS

## 2024-05-13 MED ORDER — HEPARIN SODIUM (PORCINE) 1000 UNIT/ML IJ SOLN
INTRAMUSCULAR | Status: AC
  Filled 2024-05-13: qty 10

## 2024-05-13 MED ORDER — DIPHENHYDRAMINE HCL 50 MG/ML IJ SOLN: 50.0000 mg | Freq: Once | INTRAMUSCULAR | Status: DC | PRN

## 2024-05-13 MED ORDER — ONDANSETRON HCL 4 MG/2ML IJ SOLN: 4.0000 mg | Freq: Four times a day (QID) | INTRAMUSCULAR | Status: DC | PRN

## 2024-05-13 MED ORDER — FENTANYL CITRATE (PF) 100 MCG/2ML IJ SOLN
INTRAMUSCULAR | Status: AC
  Filled 2024-05-13: qty 2

## 2024-05-13 MED ORDER — IODIXANOL 320 MG/ML IV SOLN
INTRAVENOUS | Status: DC | PRN
  Administered 2024-05-13: 95 mL via INTRA_ARTERIAL

## 2024-05-13 MED ORDER — SODIUM CHLORIDE 0.9 % IV SOLN: 250.0000 mL | INTRAVENOUS | Status: DC | PRN

## 2024-05-13 MED ORDER — MIDAZOLAM HCL 5 MG/5ML IJ SOLN
INTRAMUSCULAR | Status: AC
  Filled 2024-05-13: qty 5

## 2024-05-13 MED ORDER — METHYLPREDNISOLONE SODIUM SUCC 125 MG IJ SOLR: 125.0000 mg | Freq: Once | INTRAMUSCULAR | Status: DC | PRN

## 2024-05-13 MED ORDER — OXYCODONE HCL 5 MG PO TABS: 5.0000 mg | ORAL_TABLET | ORAL | Status: DC | PRN

## 2024-05-13 MED ORDER — HEPARIN (PORCINE) IN NACL 1000-0.9 UT/500ML-% IV SOLN
INTRAVENOUS | Status: DC | PRN
  Administered 2024-05-13: 1000 mL

## 2024-05-13 MED ORDER — HEPARIN SODIUM (PORCINE) 1000 UNIT/ML IJ SOLN
INTRAMUSCULAR | Status: DC | PRN
  Administered 2024-05-13: 6000 [IU] via INTRAVENOUS

## 2024-05-13 MED ORDER — LIDOCAINE HCL (PF) 1 % IJ SOLN
INTRAMUSCULAR | Status: DC | PRN
  Administered 2024-05-13: 10 mL

## 2024-05-13 MED ORDER — FENTANYL CITRATE (PF) 100 MCG/2ML IJ SOLN
INTRAMUSCULAR | Status: DC | PRN
  Administered 2024-05-13 (×2): 25 ug via INTRAVENOUS
  Administered 2024-05-13: 50 ug via INTRAVENOUS

## 2024-05-13 MED ORDER — CLOPIDOGREL BISULFATE 75 MG PO TABS
ORAL_TABLET | ORAL | Status: AC
  Administered 2024-05-13: 300 mg via ORAL
  Filled 2024-05-13: qty 4

## 2024-05-13 MED ORDER — SODIUM CHLORIDE 0.9% FLUSH: 3.0000 mL | Freq: Two times a day (BID) | INTRAVENOUS | Status: DC

## 2024-05-13 MED ORDER — CLOPIDOGREL BISULFATE 300 MG PO TABS: 300.0000 mg | ORAL_TABLET | ORAL | Status: AC

## 2024-05-13 MED ORDER — HYDRALAZINE HCL 20 MG/ML IJ SOLN: 5.0000 mg | INTRAMUSCULAR | Status: DC | PRN

## 2024-05-13 SURGICAL SUPPLY — 30 items
BALLOON DORADO 7X80X80 (BALLOONS) IMPLANT
BALLOON LUTONIX 7X220X130 (BALLOONS) IMPLANT
BALLOON LUTONIX DCB 7X60X130 (BALLOONS) IMPLANT
BALLOON ULTRVRSE 4X40X130C (BALLOONS) IMPLANT
CATH ANGIO 5F PIGTAIL 65CM (CATHETERS) IMPLANT
CATH BEACON 5 .038 100 VERT TP (CATHETERS) IMPLANT
CATH ROTAREX 135 6FR (CATHETERS) IMPLANT
CATH VS15FR (CATHETERS) IMPLANT
COVER PROBE ULTRASOUND 5X96 (MISCELLANEOUS) IMPLANT
DEVICE PRESTO INFLATION (MISCELLANEOUS) IMPLANT
DEVICE TORQUE (MISCELLANEOUS) IMPLANT
DEVICE VASC CLSR CELT ART 7 (Vascular Products) IMPLANT
GLIDEWIRE ADV .035X260CM (WIRE) IMPLANT
GLIDEWIRE ANGLED SS 035X260CM (WIRE) IMPLANT
GOWN STRL REUS W/ TWL LRG LVL3 (GOWN DISPOSABLE) ×1 IMPLANT
NDL ENTRY 21GA 7CM ECHOTIP (NEEDLE) IMPLANT
NEEDLE ENTRY 21GA 7CM ECHOTIP (NEEDLE) ×1 IMPLANT
PACK ANGIOGRAPHY (CUSTOM PROCEDURE TRAY) ×1 IMPLANT
PAD SORBX EP SHIELD 16.5X12 (MISCELLANEOUS) IMPLANT
SET INTRO CAPELLA COAXIAL (SET/KITS/TRAYS/PACK) IMPLANT
SHEATH ANL2 6FRX45 HC (SHEATH) IMPLANT
SHEATH BRITE TIP 5FRX11 (SHEATH) IMPLANT
SHEATH BRITE TIP 7FRX11 (SHEATH) IMPLANT
SHEATH FLEXOR ANSEL2 7FRX45 (SHEATH) IMPLANT
STENT VIABAHN 8X15X120 7FR (Permanent Stent) IMPLANT
STENT VIABAHN 8X50X120 (Permanent Stent) IMPLANT
SYR MEDRAD MARK 7 150ML (SYRINGE) IMPLANT
TUBING CONTRAST HIGH PRESS 72 (TUBING) IMPLANT
WIRE G V18X300CM (WIRE) IMPLANT
WIRE J 3MM .035X145CM (WIRE) IMPLANT

## 2024-05-13 NOTE — Op Note (Signed)
 [facility]  Percutaneous Study/Intervention Procedural Note   Date of Surgery: 05/13/2024  Surgeon:  Cordella JUDITHANN Shawl, MD.  Pre-operative Diagnosis: Atherosclerotic occlusive disease bilateral lower extremities with rest pain of the left lower extremity  Post-operative diagnosis:  Same  Procedure(s) Performed:             1.  Introduction catheter into left lower extremity 3rd order catheter placement              2.    Contrast injection left lower extremity for distal runoff             3.  Percutaneous transluminal angioplasty and stent placement left superficial femoral artery.              4.  Atherectomy using the [place] Rex thromboatherectomy catheter left SFA.                5.  Celt closure right common femoral arteriotomy  Anesthesia: Conscious sedation was administered under my direct supervision by the interventional radiology RN. IV Versed  plus fentanyl  were utilized. Continuous ECG, pulse oximetry and blood pressure was monitored throughout the entire procedure.  Conscious sedation was for a total of 97 minutes.  Sheath: 7 [place] Ansell right common femoral retrograde  Contrast: 95 cc  Fluoroscopy Time: 23.7 minutes  Indications:  George Doyle presents with increasing pain of his left lower extremity even at rest.  The patient has known atherosclerotic occlusive disease.  Noninvasive studies as well as physical examination demonstrate significant atherosclerotic changes.  This places him at increased risk for limb loss.  Angiography with the hope for intervention for limb salvage is recommended.  The risks and benefits are reviewed all questions answered patient agrees to proceed.  Procedure:  George Doyle is a 73 y.o. y.o. male who was identified and appropriate procedural time out was performed.  The patient was then placed supine on the table and prepped and draped in the usual sterile fashion.    Ultrasound was placed  in the sterile sleeve and the right groin was evaluated the right common femoral artery was echolucent and pulsatile indicating patency.  Image was recorded for the permanent record and under real-time visualization a microneedle was inserted into the common femoral artery microwire followed by a micro-sheath.  A J-wire was then advanced through the micro-sheath and a  5 [place] sheath was then inserted over a J-wire. J-wire was then advanced and a 5 French pigtail catheter was positioned at the level of T12. AP projection of the aorta was then obtained. Pigtail catheter was repositioned to above the bifurcation and a RAO view of the pelvis was obtained.  Subsequently a V S1 catheter with the stiff angle Glidewire was used to cross the aortic bifurcation the catheter wire were advanced down into the left distal external iliac artery. Oblique view of the femoral bifurcation was then obtained and subsequently the wire was reintroduced and the pigtail catheter negotiated into the SFA representing third order catheter placement. Distal runoff was then performed.  6000 units of heparin  was then given and allowed to circulate and a 7 [place] Ansell sheath was advanced up and over the bifurcation and positioned in the femoral artery  KMP  catheter and stiff angle Glidewire were then negotiated across the mid to distal SFA occlusion into the mid popliteal.  Distal runoff was then completed by hand injection through the catheter. The V18 wire was then reintroduced and a 4  mm x 40 mm Ultraverse balloon was used to angioplasty the superficial femoral artery, 3 inflations to 10 atm were made for 30 seconds each.   The [place] Rex thromboatherectomy catheter was then delivered onto the field.  And atherectomy was performed of the SFA from its midportion to its distal portion essentially at Hunter's canal.  Follow-up imaging demonstrated successful recanalization but with greater than 60 to 70% diffuse residual stenosis.  A 8 mm  x 150 mm Viabahn stent was then deployed.  It was postdilated with a 7 mm x 220 mm Lutonix drug-eluting balloon inflated to 14 atm for approximately 1 minute.  There was 1 focal residual stenosis and a 7 mm x 40 mm [place] balloon was advanced across this specific area and inflated to 16 atm with the lesion yielding.  The detector was then repositioned more proximally and an LAO projection of the femoral bifurcation was performed.  A 8 mm x 50 mm Viabahn stent was then deployed across the proximal lesion.  It was postdilated with a 7 mm x 60 mm Lutonix drug-eluting balloon inflated to 14 atm for approximately 1 minute.  Follow-up imaging of the SFA and popliteal now demonstrate successful recanalization with less than 10% residual stenosis.  The popliteal remains widely patent with single-vessel runoff to the foot via the peroneal.  After review of these images the sheath is pulled into the right external iliac oblique of the common femoral is obtained and a Celt device deployed. There no immediate complications.   Findings:  The abdominal aorta is opacified with a bolus injection contrast. Renal arteries are single and widely patent. The aorta itself has diffuse disease but no hemodynamically significant lesions there are moderate aneurysmal changes noted. The common and external iliac arteries are patent bilaterally but there is heavy calcific disease with the appearance of significant ulcerations in the left common iliac artery.  There are aneurysmal changes to the common iliac arteries bilaterally.  External iliac arteries are diffusely diseased but patent without hemodynamically significant stenosis..  The left common femoral is widely patent as is the profunda femoris.  The SFA does indeed have a significant stenosis in its proximal one third.  In its midportion it is occluded with several greater than 90% stenoses associated with this occlusion.  The popliteal demonstrates diffuse disease but there are  no hemodynamically significant stenoses and the trifurcation is heavily diseased with occlusion of the anterior tibial and its midportion.  It does reconstitute distally but is rather small.  The tibioperoneal trunk and peroneal are widely patent and the dominant runoff to the foot with extensive Collaterals at the ankle level.  The posterior tibial demonstrates diffuse disease throughout its course with multiple areas of short segment occlusion.    Following atherectomy there is successful recanalization of the SFA.  Following angioplasty and stent placement the SFA is now patent with in-line flow and looks quite nice, there is less than 10% residual stenosis throughout the femoral-popliteal system.  There is preservation of the single-vessel runoff to the foot.     Summary: Successful recanalization left lower extremity for limb salvage                        Disposition: Patient was taken to the recovery room in stable condition having tolerated the procedure well.  Vinnie Gombert, Cordella MATSU 05/13/2024,10:32 AM

## 2024-05-13 NOTE — Interval H&P Note (Signed)
 History and Physical Interval Note:  05/13/2024 8:21 AM  George Doyle  has presented today for surgery, with the diagnosis of LLE Angio w possible intervention    ASO w ulceration.  The various methods of treatment have been discussed with the patient and family. After consideration of risks, benefits and other options for treatment, the patient has consented to  Procedure(s): Lower Extremity Angiography (Left) as a surgical intervention.  [name] history has been reviewed, patient examined, no change in status, stable for surgery.  I have reviewed the patient's chart and labs.  Questions were answered to the patient's satisfaction.     Cordella Shawl

## 2024-05-13 NOTE — H&P (View-Only) (Signed)
 MRN : [id number]  George Doyle is a 73 y.o. (Aug 25, 1951) male who presents with chief complaint of check circulation.  History of Present Illness:   The patient presents to [facility] for treatment of his left lower extremity atherosclerotic occlusive disease.  He was last seen in the office April 03, 2024.  He was initially evaluated with chief complaint of left lower extremity pain. Patient states he is having pain to his left lower extremity that starts in his hip 3 times thigh down to his toes at rest and with exercise. Patient states it is interfering with his daily activities and his ability to ambulate. Patient endorses the more he ambulates the worst the pain gets. He does endorse the pain will relieve itself over time if he sits down or rests. Denies any swelling to his lower extremity. Denies any open sores or wounds to his left lower extremity today.    Current Meds  Medication Sig   acetaminophen  (TYLENOL ) 500 MG tablet Take 2 tablets (1,000 mg total) by mouth every 6 (six) hours as needed for mild pain.   aspirin  EC 81 MG EC tablet Take 1 tablet (81 mg total) daily by mouth.   atorvastatin  (LIPITOR ) 80 MG tablet TAKE 1 TABLET(80 MG) BY MOUTH DAILY AT 6 PM   carvedilol  (COREG ) 3.125 MG tablet TAKE 1 TABLET BY MOUTH 2 TIMES DAILY.   citalopram  (CELEXA ) 20 MG tablet Take 20 mg by mouth at bedtime.   donepezil  (ARICEPT ) 5 MG tablet Take 5 mg by mouth at bedtime.   fexofenadine (ALLEGRA) 180 MG tablet Take 180 mg by mouth as needed for allergies or rhinitis. otc   lamoTRIgine  (LAMICTAL ) 200 MG tablet Take 200 mg by mouth 2 (two) times daily.   losartan  (COZAAR ) 50 MG tablet TAKE 1 TABLET BY MOUTH TWICE A DAY   methocarbamol  (ROBAXIN ) 500 MG tablet Take 1 tablet (500 mg total) by mouth every 8 (eight) hours as needed for muscle spasms.   spironolactone  (ALDACTONE ) 25 MG tablet Take 1 tablet (25 mg total) by  mouth daily.    Past Medical History:  Diagnosis Date   Allergy    Arthritis    knees, hands   Ascending aorta dilatation (HCC) 07/21/2017   a.) TTE 07/21/2017: Ao root 36 mm, asc Ao 41 mm; b.) TTE 10/22/2020: asc Ao 39 mm   Coronary artery disease    Diastolic dysfunction 07/21/2017   a.) TTE 07/21/2017 (following NSTEMI): EF 50-55%, inf HK, mild MR, G1DD   Dyspnea    Former tobacco use    History of 2019 novel coronavirus disease (COVID-19) 10/21/2020   HLD (hyperlipidemia)    Hypertension    NSTEMI (non-ST elevated myocardial infarction) (HCC) 07/20/2017   a.) CP started 5 days prior to presentation (approx 07/15/2017); b.) LHC 07/23/2017: 100% p-d RCA, 60% p-mLCx (tortuous), 99% post atrio --> unsuccessful PCI; c.) referred to interventionist Laverna) in [place] on 08/30/2017 --> med mgmt recommended   NSTEMI (non-ST elevated myocardial infarction) (HCC) 10/23/2020   a.) Troponins trended: 307-->1304--> 6774 --> 6895 --> 5886 --> 5324; b.) LHC 10/23/2020:  EF >55%; 40% pLCx, CTO pRCA --> med mgmt.   Pneumonia    SDH (subdural hematoma) (HCC) 03/27/2018   a.) 6 mm frontal SDH s/p fall related to tonic-clonic seizure   Seizures (HCC) 1970   a.) last documented seizure was in 10/2020   Umbilical hernia    Vasovagal syncope     Past Surgical History:  Procedure Laterality Date   BICEPT TENODESIS Right 07/18/2018   Procedure: BICEPS TENODESIS;  Surgeon: Edie Norleen PARAS, MD;  Location: [facility];  Service: Orthopedics;  Laterality: Right;   CARDIAC CATHETERIZATION     COLONOSCOPY  2011   cleared for 10 yrs   COLONOSCOPY WITH PROPOFOL  N/A 11/06/2016   Procedure: COLONOSCOPY WITH PROPOFOL ;  Surgeon: Rogelia Copping, MD;  Location: [facility];  Service: Endoscopy;  Laterality: N/A;   COLONOSCOPY WITH PROPOFOL  N/A 05/01/2023   Procedure: COLONOSCOPY WITH PROPOFOL ;  Surgeon: Copping Rogelia, MD;  Location: [facility];  Service: Endoscopy;  Laterality: N/A;   CORONARY STENT  INTERVENTION N/A 07/23/2017   Procedure: CORONARY STENT INTERVENTION;  Surgeon: Florencio Cara BIRCH, MD;  Location: [facility];  Service: Cardiovascular;  Laterality: N/A;   CYST EXCISION     Forearm   INGUINAL HERNIA REPAIR Left    INSERTION OF MESH  05/25/2022   Procedure: INSERTION OF MESH;  Surgeon: Desiderio Schanz, MD;  Location: [facility];  Service: General;;   KNEE SURGERY Bilateral    2 on R) 1 on L)   LEFT HEART CATH AND CORONARY ANGIOGRAPHY N/A 07/23/2017   Procedure: LEFT HEART CATH AND CORONARY ANGIOGRAPHY;  Surgeon: Perla Evalene PARAS, MD;  Location: [facility];  Service: Cardiovascular;  Laterality: N/A;   LEFT HEART CATH AND CORONARY ANGIOGRAPHY Left 10/23/2020   Procedure: LEFT HEART CATH AND CORONARY ANGIOGRAPHY; Location: [facility]   POLYPECTOMY  11/06/2016   Procedure: POLYPECTOMY;  Surgeon: Rogelia Copping, MD;  Location: [facility];  Service: Endoscopy;;   SHOULDER ARTHROSCOPY WITH OPEN ROTATOR CUFF REPAIR Right 07/18/2018   Procedure: SHOULDER ARTHROSCOPY WITH OPEN ROTATOR CUFF REPAIR;  Surgeon: Edie Norleen PARAS, MD;  Location: [facility];  Service: Orthopedics;  Laterality: Right;   TIBIA IM NAIL INSERTION Left 11/19/2023   Procedure: INTRAMEDULLARY (IM) NAIL TIBIAL;  Surgeon: Kendal Franky SQUIBB, MD;  Location: [facility];  Service: Orthopedics;  Laterality: Left;    Social History Social History   Tobacco Use   Smoking status: Former    Current packs/day: 0.00    Average packs/day: 1 pack/day for 14.0 years (14.0 ttl pk-yrs)    Types: Cigarettes    Start date: 41    Quit date: 1991    Years since quitting: 34.6   Smokeless tobacco: Never   Tobacco comments:    quit over 20 yrs ago (~1991)  Vaping Use   Vaping status: Never Used  Substance Use Topics   Alcohol use: Not Currently    Comment: 1-2 drinks/year   Drug use: No    Family History Family History  Problem Relation Age of Onset   CAD Father    Lung cancer Father    Heart failure Mother      No Known Allergies   REVIEW OF SYSTEMS (Negative unless checked)  Constitutional: [] Weight loss  [] Fever  [] Chills Cardiac: [] Chest pain   [] Chest pressure   [] Palpitations   [] Shortness of breath when laying flat   [] Shortness of breath with exertion. Vascular:  [x] Pain in legs with walking   [] Pain in legs  at rest  [] History of DVT   [] Phlebitis   [] Swelling in legs   [] Varicose veins   [] Non-healing ulcers Pulmonary:   [] Uses home oxygen   [] Productive cough   [] Hemoptysis   [] Wheeze  [] COPD   [] Asthma Neurologic:  [] Dizziness   [] Seizures   [] History of stroke   [] History of TIA  [] Aphasia   [] Vissual changes   [] Weakness or numbness in arm   [] Weakness or numbness in leg Musculoskeletal:   [] Joint swelling   [] Joint pain   [] Low back pain Hematologic:  [] Easy bruising  [] Easy bleeding   [] Hypercoagulable state   [] Anemic Gastrointestinal:  [] Diarrhea   [] Vomiting  [] Gastroesophageal reflux/heartburn   [] Difficulty swallowing. Genitourinary:  [] Chronic kidney disease   [] Difficult urination  [] Frequent urination   [] Blood in urine Skin:  [] Rashes   [] Ulcers  Psychological:  [] History of anxiety   []  History of major depression.  Physical Examination  Vitals:   05/13/24 0730  BP: (!) 156/78  Pulse: (!) 56  Resp: (!) 21  Temp: 98.7 F (37.1 C)  TempSrc: Temporal  SpO2: 97%  Weight: 90.3 kg  Height: 5' 9 (1.753 m)   Body mass index is 29.39 kg/m. Gen: WD/WN, NAD Head: [place]/AT, No temporalis wasting.  Ear/Nose/Throat: Hearing grossly intact, nares w/o erythema or drainage Eyes: PER, EOMI, sclera nonicteric.  Neck: Supple, no masses.  No bruit or JVD.  Pulmonary:  Good air movement, no audible wheezing, no use of accessory muscles.  Cardiac: RRR, normal S1, S2, no Murmurs. Vascular:  mild trophic changes, no open wounds Vessel Right Left  Radial Palpable Palpable  PT Not Palpable Not Palpable  DP Not Palpable Not Palpable  Gastrointestinal: soft, non-distended. No  guarding/no peritoneal signs.  Musculoskeletal: M/S 5/5 throughout.  No visible deformity.  Neurologic: CN 2-12 intact. Pain and light touch intact in extremities.  Symmetrical.  Speech is fluent. Motor exam as listed above. Psychiatric: Judgment intact, Mood & affect appropriate for pt's clinical situation. Dermatologic: No rashes or ulcers noted.  No changes consistent with cellulitis.   CBC Lab Results  Component Value Date   WBC 9.5 02/01/2024   HGB 12.6 (L) 02/01/2024   HCT 38.3 (L) 02/01/2024   MCV 90.5 02/01/2024   PLT 290 02/01/2024    BMET    Component Value Date/Time   NA 134 12/20/2023 1504   K 5.4 (H) 12/20/2023 1504   CL 98 12/20/2023 1504   CO2 22 12/20/2023 1504   GLUCOSE 92 12/20/2023 1504   GLUCOSE 117 (H) 11/20/2023 0811   BUN 22 05/13/2024 0726   BUN 19 12/20/2023 1504   CREATININE 1.09 05/13/2024 0726   CALCIUM  10.0 12/20/2023 1504   GFRNONAA >60 05/13/2024 0726   GFRAA 101 11/05/2020 1626   Estimated Creatinine Clearance: 66 mL/min (by C-G formula based on SCr of 1.09 mg/dL).  COAG Lab Results  Component Value Date   INR 0.98 07/20/2017    Radiology No results found.   Assessment/Plan 1. Atherosclerosis of native artery of left lower extremity with intermittent claudication (HCC) (Primary) Recommend:   The patient has experienced increased claudication symptoms and is now describing lifestyle limiting claudication and appears to be having mild rest pain symptroms.   Given the severity of the patient's severe left lower extremity symptoms the patient should undergo angiography with the hope for intervention.  Risk and benefits were reviewed the patient.  Indications for the procedure were reviewed.  All questions were answered, the patient agrees to proceed  with left lower extremity angiography and possible intervention.    The patient should continue walking and begin a more formal exercise program.  The patient should continue antiplatelet  therapy and aggressive treatment of the lipid abnormalities   The patient will follow up with me after the angiogram.     2. Essential hypertension Continue antihypertensive medications as already ordered, these medications have been reviewed and there are no changes at this time.   3. Mixed hyperlipidemia Continue statin as ordered and reviewed, no changes at this time   Cordella Shawl, MD  05/13/2024 8:17 AM

## 2024-05-13 NOTE — Progress Notes (Signed)
 MRN : [id number]  George Doyle is a 73 y.o. (Aug 25, 1951) male who presents with chief complaint of check circulation.  History of Present Illness:   The patient presents to [facility] for treatment of his left lower extremity atherosclerotic occlusive disease.  He was last seen in the office April 03, 2024.  He was initially evaluated with chief complaint of left lower extremity pain. Patient states he is having pain to his left lower extremity that starts in his hip 3 times thigh down to his toes at rest and with exercise. Patient states it is interfering with his daily activities and his ability to ambulate. Patient endorses the more he ambulates the worst the pain gets. He does endorse the pain will relieve itself over time if he sits down or rests. Denies any swelling to his lower extremity. Denies any open sores or wounds to his left lower extremity today.    Current Meds  Medication Sig   acetaminophen  (TYLENOL ) 500 MG tablet Take 2 tablets (1,000 mg total) by mouth every 6 (six) hours as needed for mild pain.   aspirin  EC 81 MG EC tablet Take 1 tablet (81 mg total) daily by mouth.   atorvastatin  (LIPITOR ) 80 MG tablet TAKE 1 TABLET(80 MG) BY MOUTH DAILY AT 6 PM   carvedilol  (COREG ) 3.125 MG tablet TAKE 1 TABLET BY MOUTH 2 TIMES DAILY.   citalopram  (CELEXA ) 20 MG tablet Take 20 mg by mouth at bedtime.   donepezil  (ARICEPT ) 5 MG tablet Take 5 mg by mouth at bedtime.   fexofenadine (ALLEGRA) 180 MG tablet Take 180 mg by mouth as needed for allergies or rhinitis. otc   lamoTRIgine  (LAMICTAL ) 200 MG tablet Take 200 mg by mouth 2 (two) times daily.   losartan  (COZAAR ) 50 MG tablet TAKE 1 TABLET BY MOUTH TWICE A DAY   methocarbamol  (ROBAXIN ) 500 MG tablet Take 1 tablet (500 mg total) by mouth every 8 (eight) hours as needed for muscle spasms.   spironolactone  (ALDACTONE ) 25 MG tablet Take 1 tablet (25 mg total) by  mouth daily.    Past Medical History:  Diagnosis Date   Allergy    Arthritis    knees, hands   Ascending aorta dilatation (HCC) 07/21/2017   a.) TTE 07/21/2017: Ao root 36 mm, asc Ao 41 mm; b.) TTE 10/22/2020: asc Ao 39 mm   Coronary artery disease    Diastolic dysfunction 07/21/2017   a.) TTE 07/21/2017 (following NSTEMI): EF 50-55%, inf HK, mild MR, G1DD   Dyspnea    Former tobacco use    History of 2019 novel coronavirus disease (COVID-19) 10/21/2020   HLD (hyperlipidemia)    Hypertension    NSTEMI (non-ST elevated myocardial infarction) (HCC) 07/20/2017   a.) CP started 5 days prior to presentation (approx 07/15/2017); b.) LHC 07/23/2017: 100% p-d RCA, 60% p-mLCx (tortuous), 99% post atrio --> unsuccessful PCI; c.) referred to interventionist Laverna) in [place] on 08/30/2017 --> med mgmt recommended   NSTEMI (non-ST elevated myocardial infarction) (HCC) 10/23/2020   a.) Troponins trended: 307-->1304--> 6774 --> 6895 --> 5886 --> 5324; b.) LHC 10/23/2020:  EF >55%; 40% pLCx, CTO pRCA --> med mgmt.   Pneumonia    SDH (subdural hematoma) (HCC) 03/27/2018   a.) 6 mm frontal SDH s/p fall related to tonic-clonic seizure   Seizures (HCC) 1970   a.) last documented seizure was in 10/2020   Umbilical hernia    Vasovagal syncope     Past Surgical History:  Procedure Laterality Date   BICEPT TENODESIS Right 07/18/2018   Procedure: BICEPS TENODESIS;  Surgeon: Edie Norleen PARAS, MD;  Location: [facility];  Service: Orthopedics;  Laterality: Right;   CARDIAC CATHETERIZATION     COLONOSCOPY  2011   cleared for 10 yrs   COLONOSCOPY WITH PROPOFOL  N/A 11/06/2016   Procedure: COLONOSCOPY WITH PROPOFOL ;  Surgeon: Rogelia Copping, MD;  Location: [facility];  Service: Endoscopy;  Laterality: N/A;   COLONOSCOPY WITH PROPOFOL  N/A 05/01/2023   Procedure: COLONOSCOPY WITH PROPOFOL ;  Surgeon: Copping Rogelia, MD;  Location: [facility];  Service: Endoscopy;  Laterality: N/A;   CORONARY STENT  INTERVENTION N/A 07/23/2017   Procedure: CORONARY STENT INTERVENTION;  Surgeon: Florencio Cara BIRCH, MD;  Location: [facility];  Service: Cardiovascular;  Laterality: N/A;   CYST EXCISION     Forearm   INGUINAL HERNIA REPAIR Left    INSERTION OF MESH  05/25/2022   Procedure: INSERTION OF MESH;  Surgeon: Desiderio Schanz, MD;  Location: [facility];  Service: General;;   KNEE SURGERY Bilateral    2 on R) 1 on L)   LEFT HEART CATH AND CORONARY ANGIOGRAPHY N/A 07/23/2017   Procedure: LEFT HEART CATH AND CORONARY ANGIOGRAPHY;  Surgeon: Perla Evalene PARAS, MD;  Location: [facility];  Service: Cardiovascular;  Laterality: N/A;   LEFT HEART CATH AND CORONARY ANGIOGRAPHY Left 10/23/2020   Procedure: LEFT HEART CATH AND CORONARY ANGIOGRAPHY; Location: [facility]   POLYPECTOMY  11/06/2016   Procedure: POLYPECTOMY;  Surgeon: Rogelia Copping, MD;  Location: [facility];  Service: Endoscopy;;   SHOULDER ARTHROSCOPY WITH OPEN ROTATOR CUFF REPAIR Right 07/18/2018   Procedure: SHOULDER ARTHROSCOPY WITH OPEN ROTATOR CUFF REPAIR;  Surgeon: Edie Norleen PARAS, MD;  Location: [facility];  Service: Orthopedics;  Laterality: Right;   TIBIA IM NAIL INSERTION Left 11/19/2023   Procedure: INTRAMEDULLARY (IM) NAIL TIBIAL;  Surgeon: Kendal Franky SQUIBB, MD;  Location: [facility];  Service: Orthopedics;  Laterality: Left;    Social History Social History   Tobacco Use   Smoking status: Former    Current packs/day: 0.00    Average packs/day: 1 pack/day for 14.0 years (14.0 ttl pk-yrs)    Types: Cigarettes    Start date: 41    Quit date: 1991    Years since quitting: 34.6   Smokeless tobacco: Never   Tobacco comments:    quit over 20 yrs ago (~1991)  Vaping Use   Vaping status: Never Used  Substance Use Topics   Alcohol use: Not Currently    Comment: 1-2 drinks/year   Drug use: No    Family History Family History  Problem Relation Age of Onset   CAD Father    Lung cancer Father    Heart failure Mother      No Known Allergies   REVIEW OF SYSTEMS (Negative unless checked)  Constitutional: [] Weight loss  [] Fever  [] Chills Cardiac: [] Chest pain   [] Chest pressure   [] Palpitations   [] Shortness of breath when laying flat   [] Shortness of breath with exertion. Vascular:  [x] Pain in legs with walking   [] Pain in legs  at rest  [] History of DVT   [] Phlebitis   [] Swelling in legs   [] Varicose veins   [] Non-healing ulcers Pulmonary:   [] Uses home oxygen   [] Productive cough   [] Hemoptysis   [] Wheeze  [] COPD   [] Asthma Neurologic:  [] Dizziness   [] Seizures   [] History of stroke   [] History of TIA  [] Aphasia   [] Vissual changes   [] Weakness or numbness in arm   [] Weakness or numbness in leg Musculoskeletal:   [] Joint swelling   [] Joint pain   [] Low back pain Hematologic:  [] Easy bruising  [] Easy bleeding   [] Hypercoagulable state   [] Anemic Gastrointestinal:  [] Diarrhea   [] Vomiting  [] Gastroesophageal reflux/heartburn   [] Difficulty swallowing. Genitourinary:  [] Chronic kidney disease   [] Difficult urination  [] Frequent urination   [] Blood in urine Skin:  [] Rashes   [] Ulcers  Psychological:  [] History of anxiety   []  History of major depression.  Physical Examination  Vitals:   05/13/24 0730  BP: (!) 156/78  Pulse: (!) 56  Resp: (!) 21  Temp: 98.7 F (37.1 C)  TempSrc: Temporal  SpO2: 97%  Weight: 90.3 kg  Height: 5' 9 (1.753 m)   Body mass index is 29.39 kg/m. Gen: WD/WN, NAD Head: [place]/AT, No temporalis wasting.  Ear/Nose/Throat: Hearing grossly intact, nares w/o erythema or drainage Eyes: PER, EOMI, sclera nonicteric.  Neck: Supple, no masses.  No bruit or JVD.  Pulmonary:  Good air movement, no audible wheezing, no use of accessory muscles.  Cardiac: RRR, normal S1, S2, no Murmurs. Vascular:  mild trophic changes, no open wounds Vessel Right Left  Radial Palpable Palpable  PT Not Palpable Not Palpable  DP Not Palpable Not Palpable  Gastrointestinal: soft, non-distended. No  guarding/no peritoneal signs.  Musculoskeletal: M/S 5/5 throughout.  No visible deformity.  Neurologic: CN 2-12 intact. Pain and light touch intact in extremities.  Symmetrical.  Speech is fluent. Motor exam as listed above. Psychiatric: Judgment intact, Mood & affect appropriate for pt's clinical situation. Dermatologic: No rashes or ulcers noted.  No changes consistent with cellulitis.   CBC Lab Results  Component Value Date   WBC 9.5 02/01/2024   HGB 12.6 (L) 02/01/2024   HCT 38.3 (L) 02/01/2024   MCV 90.5 02/01/2024   PLT 290 02/01/2024    BMET    Component Value Date/Time   NA 134 12/20/2023 1504   K 5.4 (H) 12/20/2023 1504   CL 98 12/20/2023 1504   CO2 22 12/20/2023 1504   GLUCOSE 92 12/20/2023 1504   GLUCOSE 117 (H) 11/20/2023 0811   BUN 22 05/13/2024 0726   BUN 19 12/20/2023 1504   CREATININE 1.09 05/13/2024 0726   CALCIUM  10.0 12/20/2023 1504   GFRNONAA >60 05/13/2024 0726   GFRAA 101 11/05/2020 1626   Estimated Creatinine Clearance: 66 mL/min (by C-G formula based on SCr of 1.09 mg/dL).  COAG Lab Results  Component Value Date   INR 0.98 07/20/2017    Radiology No results found.   Assessment/Plan 1. Atherosclerosis of native artery of left lower extremity with intermittent claudication (HCC) (Primary) Recommend:   The patient has experienced increased claudication symptoms and is now describing lifestyle limiting claudication and appears to be having mild rest pain symptroms.   Given the severity of the patient's severe left lower extremity symptoms the patient should undergo angiography with the hope for intervention.  Risk and benefits were reviewed the patient.  Indications for the procedure were reviewed.  All questions were answered, the patient agrees to proceed  with left lower extremity angiography and possible intervention.    The patient should continue walking and begin a more formal exercise program.  The patient should continue antiplatelet  therapy and aggressive treatment of the lipid abnormalities   The patient will follow up with me after the angiogram.     2. Essential hypertension Continue antihypertensive medications as already ordered, these medications have been reviewed and there are no changes at this time.   3. Mixed hyperlipidemia Continue statin as ordered and reviewed, no changes at this time   Cordella Shawl, MD  05/13/2024 8:17 AM

## 2024-05-28 ENCOUNTER — Other Ambulatory Visit (INDEPENDENT_AMBULATORY_CARE_PROVIDER_SITE_OTHER): Payer: Self-pay | Admitting: Vascular Surgery

## 2024-05-28 DIAGNOSIS — I739 Peripheral vascular disease, unspecified: Secondary | ICD-10-CM

## 2024-06-01 DIAGNOSIS — I70219 Atherosclerosis of native arteries of extremities with intermittent claudication, unspecified extremity: Secondary | ICD-10-CM | POA: Insufficient documentation

## 2024-06-01 NOTE — Progress Notes (Signed)
 MRN : [id number]  George Doyle is a 73 y.o. (07-20-1951) male who presents with chief complaint of check circulation.  History of Present Illness:   The patient returns to the office for followup and review status post angiogram with intervention on 05/13/2024.   Procedure:   Percutaneous transluminal angioplasty and stent placement left superficial femoral artery. 2.     Atherectomy using the [place] thromboatherectomy catheter left SFA.   The patient notes improvement in the lower extremity symptoms. No interval shortening of the patient's claudication distance or rest pain symptoms. No new ulcers or wounds have occurred since the last visit.  There have been no significant changes to the patient's overall health care.  No documented history of amaurosis fugax or recent TIA symptoms. There are no recent neurological changes noted. No documented history of DVT, PE or superficial thrombophlebitis. The patient denies recent episodes of angina or shortness of breath.      Past Medical History:  Diagnosis Date   Allergy    Arthritis    knees, hands   Ascending aorta dilatation (HCC) 07/21/2017   a.) TTE 07/21/2017: Ao root 36 mm, asc Ao 41 mm; b.) TTE 10/22/2020: asc Ao 39 mm   Coronary artery disease    Diastolic dysfunction 07/21/2017   a.) TTE 07/21/2017 (following NSTEMI): EF 50-55%, inf HK, mild MR, G1DD   Dyspnea    Former tobacco use    History of 2019 novel coronavirus disease (COVID-19) 10/21/2020   HLD (hyperlipidemia)    Hypertension    NSTEMI (non-ST elevated myocardial infarction) (HCC) 07/20/2017   a.) CP started 5 days prior to presentation (approx 07/15/2017); b.) LHC 07/23/2017: 100% p-d RCA, 60% p-mLCx (tortuous), 99% post atrio --> unsuccessful PCI; c.) referred to interventionist Laverna) in [place] on 08/30/2017 --> med mgmt recommended   NSTEMI (non-ST elevated myocardial  infarction) (HCC) 10/23/2020   a.) Troponins trended: 307-->1304--> 6774 --> 6895 --> 5886 --> 5324; b.) LHC 10/23/2020: EF >55%; 40% pLCx, CTO pRCA --> med mgmt.   Pneumonia    SDH (subdural hematoma) (HCC) 03/27/2018   a.) 6 mm frontal SDH s/p fall related to tonic-clonic seizure   Seizures (HCC) 1970   a.) last documented seizure was in 10/2020   Umbilical hernia    Vasovagal syncope     Past Surgical History:  Procedure Laterality Date   BICEPT TENODESIS Right 07/18/2018   Procedure: BICEPS TENODESIS;  Surgeon: Edie Norleen PARAS, MD;  Location: [facility];  Service: Orthopedics;  Laterality: Right;   CARDIAC CATHETERIZATION     COLONOSCOPY  2011   cleared for 10 yrs   COLONOSCOPY WITH PROPOFOL  N/A 11/06/2016   Procedure: COLONOSCOPY WITH PROPOFOL ;  Surgeon: Rogelia Copping, MD;  Location: [facility];  Service: Endoscopy;  Laterality: N/A;   COLONOSCOPY WITH PROPOFOL  N/A 05/01/2023   Procedure: COLONOSCOPY WITH PROPOFOL ;  Surgeon: Copping Rogelia, MD;  Location: [facility];  Service: Endoscopy;  Laterality: N/A;   CORONARY STENT INTERVENTION N/A 07/23/2017   Procedure: CORONARY STENT INTERVENTION;  Surgeon: Florencio Cara BIRCH, MD;  Location: [facility];  Service: Cardiovascular;  Laterality: N/A;   CYST EXCISION     Forearm   INGUINAL HERNIA REPAIR Left    INSERTION OF MESH  05/25/2022   Procedure: INSERTION OF MESH;  Surgeon: Desiderio Schanz, MD;  Location: [facility];  Service: General;;   KNEE SURGERY Bilateral    2 on R) 1 on L)   LEFT HEART CATH AND CORONARY ANGIOGRAPHY N/A 07/23/2017   Procedure: LEFT HEART CATH AND CORONARY ANGIOGRAPHY;  Surgeon: Perla Evalene PARAS, MD;  Location: [facility];  Service: Cardiovascular;  Laterality: N/A;   LEFT HEART CATH AND CORONARY ANGIOGRAPHY Left 10/23/2020   Procedure: LEFT HEART CATH AND CORONARY ANGIOGRAPHY; Location: [facility]   LOWER EXTREMITY ANGIOGRAPHY Left 05/13/2024   Procedure: Lower Extremity Angiography;  Surgeon:  Jama Cordella MATSU, MD;  Location: [facility];  Service: Cardiovascular;  Laterality: Left;   POLYPECTOMY  11/06/2016   Procedure: POLYPECTOMY;  Surgeon: Rogelia Copping, MD;  Location: [facility];  Service: Endoscopy;;   SHOULDER ARTHROSCOPY WITH OPEN ROTATOR CUFF REPAIR Right 07/18/2018   Procedure: SHOULDER ARTHROSCOPY WITH OPEN ROTATOR CUFF REPAIR;  Surgeon: Edie Norleen PARAS, MD;  Location: [facility];  Service: Orthopedics;  Laterality: Right;   TIBIA IM NAIL INSERTION Left 11/19/2023   Procedure: INTRAMEDULLARY (IM) NAIL TIBIAL;  Surgeon: Kendal Franky SQUIBB, MD;  Location: [facility];  Service: Orthopedics;  Laterality: Left;    Social History Social History   Tobacco Use   Smoking status: Former    Current packs/day: 0.00    Average packs/day: 1 pack/day for 14.0 years (14.0 ttl pk-yrs)    Types: Cigarettes    Start date: 50    Quit date: 42    Years since quitting: 34.7   Smokeless tobacco: Never   Tobacco comments:    quit over 20 yrs ago (~1991)  Vaping Use   Vaping status: Never Used  Substance Use Topics   Alcohol use: Not Currently    Comment: 1-2 drinks/year   Drug use: No    Family History Family History  Problem Relation Age of Onset   CAD Father    Lung cancer Father    Heart failure Mother     No Known Allergies   REVIEW OF SYSTEMS (Negative unless checked)  Constitutional: [] Weight loss  [] Fever  [] Chills Cardiac: [] Chest pain   [] Chest pressure   [] Palpitations   [] Shortness of breath when laying flat   [] Shortness of breath with exertion. Vascular:  [x] Pain in legs with walking   [] Pain in legs at rest  [] History of DVT   [] Phlebitis   [] Swelling in legs   [] Varicose veins   [] Non-healing ulcers Pulmonary:   [] Uses home oxygen   [] Productive cough   [] Hemoptysis   [] Wheeze  [] COPD   [] Asthma Neurologic:  [] Dizziness   [] Seizures   [] History of stroke   [] History of TIA  [] Aphasia   [] Vissual changes   [] Weakness or numbness in arm   [] Weakness  or numbness in leg Musculoskeletal:   [] Joint swelling   [] Joint pain   [] Low back pain Hematologic:  [] Easy bruising  [] Easy bleeding   [] Hypercoagulable state   [] Anemic Gastrointestinal:  [] Diarrhea   [] Vomiting  [] Gastroesophageal reflux/heartburn   [] Difficulty swallowing. Genitourinary:  [] Chronic kidney disease   [] Difficult urination  [] Frequent urination   [] Blood in urine Skin:  [] Rashes   [] Ulcers  Psychological:  [] History of anxiety   []  History of major depression.  Physical Examination  There were no  vitals filed for this visit. There is no height or weight on file to calculate BMI. Gen: WD/WN, NAD Head: [place]/AT, No temporalis wasting.  Ear/Nose/Throat: Hearing grossly intact, nares w/o erythema or drainage Eyes: PER, EOMI, sclera nonicteric.  Neck: Supple, no masses.  No bruit or JVD.  Pulmonary:  Good air movement, no audible wheezing, no use of accessory muscles.  Cardiac: RRR, normal S1, S2, no Murmurs. Vascular:  mild trophic changes, no open wounds Vessel Right Left  Radial Palpable Palpable  PT Not Palpable Not Palpable  DP Not Palpable Not Palpable  Gastrointestinal: soft, non-distended. No guarding/no peritoneal signs.  Musculoskeletal: M/S 5/5 throughout.  No visible deformity.  Neurologic: CN 2-12 intact. Pain and light touch intact in extremities.  Symmetrical.  Speech is fluent. Motor exam as listed above. Psychiatric: Judgment intact, Mood & affect appropriate for pt's clinical situation. Dermatologic: No rashes or ulcers noted.  No changes consistent with cellulitis.   CBC Lab Results  Component Value Date   WBC 9.5 02/01/2024   HGB 12.6 (L) 02/01/2024   HCT 38.3 (L) 02/01/2024   MCV 90.5 02/01/2024   PLT 290 02/01/2024    BMET    Component Value Date/Time   NA 134 12/20/2023 1504   K 5.4 (H) 12/20/2023 1504   CL 98 12/20/2023 1504   CO2 22 12/20/2023 1504   GLUCOSE 92 12/20/2023 1504   GLUCOSE 117 (H) 11/20/2023 0811   BUN 22 05/13/2024  0726   BUN 19 12/20/2023 1504   CREATININE 1.09 05/13/2024 0726   CALCIUM  10.0 12/20/2023 1504   GFRNONAA >60 05/13/2024 0726   GFRAA 101 11/05/2020 1626   CrCl cannot be calculated (Unknown ideal weight.).  COAG Lab Results  Component Value Date   INR 0.98 07/20/2017    Radiology PERIPHERAL VASCULAR CATHETERIZATION Result Date: 05/13/2024 See surgical note for result.    Assessment/Plan 1. Atherosclerosis of native artery of both lower extremities with intermittent claudication (Primary)  Recommend:  The patient has evidence of atherosclerosis of the lower extremities with claudication.  The patient does not voice lifestyle limiting changes at this point in time.  Noninvasive studies do not suggest clinically significant change.  No invasive studies, angiography or surgery at this time The patient should continue walking and begin a more formal exercise program.  The patient should continue antiplatelet therapy and aggressive treatment of the lipid abnormalities  No changes in the patient's medications at this time  Continued surveillance is indicated as atherosclerosis is likely to progress with time.    The patient will continue follow up with noninvasive studies as ordered.  - VAS US  ABI WITH/WO TBI; Future - VAS US  LOWER EXTREMITY ARTERIAL DUPLEX; Future  2. Essential hypertension Continue antihypertensive medications as already ordered, these medications have been reviewed and there are no changes at this time.  3. CAD, multiple vessel Continue cardiac and antihypertensive medications as already ordered and reviewed, no changes at this time.  Continue statin as ordered and reviewed, no changes at this time  Nitrates PRN for chest pain  4. Mixed hyperlipidemia Continue statin as ordered and reviewed, no changes at this time    Cordella Shawl, MD  06/01/2024 4:54 PM

## 2024-06-02 ENCOUNTER — Encounter (INDEPENDENT_AMBULATORY_CARE_PROVIDER_SITE_OTHER): Payer: Self-pay | Admitting: Vascular Surgery

## 2024-06-02 ENCOUNTER — Ambulatory Visit (INDEPENDENT_AMBULATORY_CARE_PROVIDER_SITE_OTHER): Admitting: Vascular Surgery

## 2024-06-02 ENCOUNTER — Ambulatory Visit (INDEPENDENT_AMBULATORY_CARE_PROVIDER_SITE_OTHER)

## 2024-06-02 VITALS — BP 166/71 | HR 52 | Ht 69.0 in | Wt 200.6 lb

## 2024-06-02 DIAGNOSIS — E782 Mixed hyperlipidemia: Secondary | ICD-10-CM

## 2024-06-02 DIAGNOSIS — I10 Essential (primary) hypertension: Secondary | ICD-10-CM | POA: Diagnosis not present

## 2024-06-02 DIAGNOSIS — I2510 Atherosclerotic heart disease of native coronary artery without angina pectoris: Secondary | ICD-10-CM

## 2024-06-02 DIAGNOSIS — I70213 Atherosclerosis of native arteries of extremities with intermittent claudication, bilateral legs: Secondary | ICD-10-CM

## 2024-06-02 DIAGNOSIS — I739 Peripheral vascular disease, unspecified: Secondary | ICD-10-CM | POA: Diagnosis not present

## 2024-06-02 DIAGNOSIS — Z9889 Other specified postprocedural states: Secondary | ICD-10-CM

## 2024-06-04 LAB — VAS US ABI WITH/WO TBI
Left ABI: 1.12
Right ABI: 1.2

## 2024-06-14 ENCOUNTER — Encounter (INDEPENDENT_AMBULATORY_CARE_PROVIDER_SITE_OTHER): Payer: Self-pay | Admitting: Vascular Surgery

## 2024-08-18 ENCOUNTER — Ambulatory Visit: Admitting: Family Medicine

## 2024-08-18 VITALS — BP 130/70 | HR 62 | Ht 69.0 in | Wt 208.0 lb

## 2024-08-18 DIAGNOSIS — S065XAA Traumatic subdural hemorrhage with loss of consciousness status unknown, initial encounter: Secondary | ICD-10-CM

## 2024-08-18 DIAGNOSIS — I10 Essential (primary) hypertension: Secondary | ICD-10-CM | POA: Diagnosis not present

## 2024-08-18 DIAGNOSIS — I2510 Atherosclerotic heart disease of native coronary artery without angina pectoris: Secondary | ICD-10-CM | POA: Diagnosis not present

## 2024-08-18 DIAGNOSIS — I739 Peripheral vascular disease, unspecified: Secondary | ICD-10-CM | POA: Diagnosis not present

## 2024-08-18 DIAGNOSIS — E782 Mixed hyperlipidemia: Secondary | ICD-10-CM

## 2024-08-18 DIAGNOSIS — G40909 Epilepsy, unspecified, not intractable, without status epilepticus: Secondary | ICD-10-CM

## 2024-08-18 DIAGNOSIS — H6123 Impacted cerumen, bilateral: Secondary | ICD-10-CM

## 2024-08-18 DIAGNOSIS — Z130 Encounter for screening for diseases of the blood and blood-forming organs and certain disorders involving the immune mechanism: Secondary | ICD-10-CM

## 2024-08-18 DIAGNOSIS — R7989 Other specified abnormal findings of blood chemistry: Secondary | ICD-10-CM

## 2024-08-18 NOTE — Progress Notes (Signed)
 Established Patient Office Visit  Patient ID: George Doyle, male    DOB: 22-Dec-1950  Age: 73 y.o. MRN: [id number] PCP: Brietta Manso K, MD  Chief Complaint  Patient presents with  . Establish Care    Subjective:     HPI  Discussed the use of AI scribe software for clinical note transcription with the patient, who gave verbal consent to proceed.  History of Present Illness George Doyle is a 73 year old male who presents  for routine 6 month follow up and also c/o pain in his left shin.  He has been experiencing pain on the inside of his left shin, which began two days ago. The pain occurs primarily when walking, and he manages it with Tylenol , avoiding stronger pain medications unless necessary. He also experiences occasional spasms in the leg, particularly at night.  He underwent surgery in March for a fracture of both bones in his left leg, with a rod inserted from the knee to the ankle. He underwent bone stimulation therapy for 150 days post-surgery and had vascular intervention to improve blood flow to the leg, as healing was initially slow.  He has a history of a seizure disorder, managed with Lamictal , and has not had a seizure in several years. He also has a history of a mild heart attack and stroke, but no stents or heart surgery. He takes several medications including Coreg , Lipitor , Celexa , Plavix , and Aricept , with his wife managing his medication regimen.  He reports a past incident of a brain bleed following a seizure and fall, which has since resolved. He also has a history of umbilical hernia repair and a childhood hip fracture from an accident. No current respiratory issues and does not use inhalers.  He mentions difficulty with ear wax buildup, affecting his hearing, and recalls previous successful treatment with ear irrigation.     Review of Systems  All other systems reviewed and are negative.     Objective:     Ht 5' 9  (1.753 m)   Wt 208 lb (94.3 kg)   BMI 30.72 kg/m     Physical Exam Vitals and nursing note reviewed.  Constitutional:      Appearance: Normal appearance.  HENT:     Head: Normocephalic.     Right Ear: External ear normal.     Left Ear: External ear normal.  Eyes:     Conjunctiva/sclera: Conjunctivae normal.  Cardiovascular:     Rate and Rhythm: Normal rate.  Pulmonary:     Effort: Pulmonary effort is normal. No respiratory distress.  Abdominal:     Palpations: Abdomen is soft.  Musculoskeletal:        General: Normal range of motion.  Skin:    General: Skin is warm.  Neurological:     Mental Status: He is alert and oriented to person, place, and time.  Psychiatric:        Mood and Affect: Mood normal.     Physical Exam     No results found for any visits on 08/18/24.     The ASCVD Risk score (Arnett DK, et al., 2019) failed to calculate for the following reasons:   Risk score cannot be calculated because patient has a medical history suggesting prior/existing ASCVD    Assessment & Plan:   Problem List Items Addressed This Visit   None   Assessment and Plan Assessment & Plan Pain and spasms in left lower leg with orthopedic hardware Intermittent pain and  spasms post-orthopedic surgery, likely hardware-related, exacerbated by walking and weather changes. - Contact orthopedic surgeon via MyChart to discuss leg pain and confirm follow-up appointment. - Continue using Tylenol  for pain management as needed.  Follow-up for traumatic subdural hemorrhage (2019) and epilepsy No surgical intervention was done. Seizure disorder managed with Lamictal , no recent seizures, long intervals between episodes. - Continue Lamictal  for seizure management. - Follow up with neurologist as scheduled.  Atherosclerotic heart disease of native coronary artery Mild myocardial infarction managed with medications, no recent cardiology follow-up. - Continue Coreg , Plavix , and  Lipitor  for heart disease management. - Verify and schedule follow-up appointment with cardiologist.  Essential hypertension controlled Hypertension managed with Cozaar  and spironolactone . - Continue Cozaar  and spironolactone  for blood pressure management.  Peripheral vascular disease stable Previous intervention for leg blood flow, scheduled follow-up with vascular specialist. - Continue follow-up with vascular specialist as scheduled.  Mixed hyperlipidemia Managed with Lipitor . - Continue Lipitor  for cholesterol management.  Hearing loss due to cerumen impaction Intermittent hearing loss due to cerumen impaction, previous irrigation effective. - b/l ear irrigation done - Use over-the-counter Debrox ear drops to prevent wax buildup.    No follow-ups on file.    Vinary K Azriel Dancy, MD [facility] at [facility]

## 2024-08-19 ENCOUNTER — Ambulatory Visit: Payer: Self-pay | Admitting: Family Medicine

## 2024-08-19 LAB — CBC WITH DIFFERENTIAL/PLATELET
Basophils Absolute: 0 x10E3/uL (ref 0.0–0.2)
Basos: 0 %
EOS (ABSOLUTE): 0.1 x10E3/uL (ref 0.0–0.4)
Eos: 1 %
Hematocrit: 43.1 % (ref 37.5–51.0)
Hemoglobin: 13.8 g/dL (ref 13.0–17.7)
Immature Grans (Abs): 0 x10E3/uL (ref 0.0–0.1)
Immature Granulocytes: 0 %
Lymphocytes Absolute: 1.4 x10E3/uL (ref 0.7–3.1)
Lymphs: 12 %
MCH: 30.8 pg (ref 26.6–33.0)
MCHC: 32 g/dL (ref 31.5–35.7)
MCV: 96 fL (ref 79–97)
Monocytes Absolute: 0.9 x10E3/uL (ref 0.1–0.9)
Monocytes: 8 %
Neutrophils Absolute: 9.6 x10E3/uL — ABNORMAL HIGH (ref 1.4–7.0)
Neutrophils: 79 %
Platelets: 232 x10E3/uL (ref 150–450)
RBC: 4.48 x10E6/uL (ref 4.14–5.80)
RDW: 12.4 % (ref 11.6–15.4)
WBC: 12.1 x10E3/uL — ABNORMAL HIGH (ref 3.4–10.8)

## 2024-08-19 LAB — COMPREHENSIVE METABOLIC PANEL WITH GFR
ALT: 13 IU/L (ref 0–44)
AST: 23 IU/L (ref 0–40)
Albumin: 4.4 g/dL (ref 3.8–4.8)
Alkaline Phosphatase: 103 IU/L (ref 47–123)
BUN/Creatinine Ratio: 18 (ref 10–24)
BUN: 17 mg/dL (ref 8–27)
Bilirubin Total: 0.6 mg/dL (ref 0.0–1.2)
CO2: 25 mmol/L (ref 20–29)
Calcium: 9.9 mg/dL (ref 8.6–10.2)
Chloride: 99 mmol/L (ref 96–106)
Creatinine, Ser: 0.97 mg/dL (ref 0.76–1.27)
Globulin, Total: 2.4 g/dL (ref 1.5–4.5)
Glucose: 50 mg/dL — ABNORMAL LOW (ref 70–99)
Potassium: 4.4 mmol/L (ref 3.5–5.2)
Sodium: 138 mmol/L (ref 134–144)
Total Protein: 6.8 g/dL (ref 6.0–8.5)
eGFR: 81 mL/min/1.73 (ref >59)

## 2024-08-19 LAB — LIPID PANEL
Chol/HDL Ratio: 1.9 ratio (ref 0.0–5.0)
Cholesterol, Total: 120 mg/dL (ref 100–199)
HDL: 62 mg/dL (ref >39)
LDL Chol Calc (NIH): 44 mg/dL (ref 0–99)
Triglycerides: 69 mg/dL (ref 0–149)
VLDL Cholesterol Cal: 14 mg/dL (ref 5–40)

## 2024-08-19 LAB — TSH+FREE T4
Free T4: 1.28 ng/dL (ref 0.82–1.77)
TSH: 1.38 u[IU]/mL (ref 0.450–4.500)

## 2024-08-20 ENCOUNTER — Other Ambulatory Visit: Payer: Self-pay | Admitting: Family Medicine

## 2024-08-20 DIAGNOSIS — M79605 Pain in left leg: Secondary | ICD-10-CM

## 2024-08-20 NOTE — Telephone Encounter (Signed)
 Please review patient's message:

## 2024-08-21 ENCOUNTER — Ambulatory Visit
Admission: RE | Admit: 2024-08-21 | Discharge: 2024-08-21 | Disposition: A | Source: Ambulatory Visit | Attending: Family Medicine | Admitting: Family Medicine

## 2024-08-21 ENCOUNTER — Ambulatory Visit
Admission: RE | Admit: 2024-08-21 | Discharge: 2024-08-21 | Disposition: A | Discharge status: Home / Self Care | Attending: Family Medicine | Admitting: Family Medicine

## 2024-08-21 ENCOUNTER — Ambulatory Visit: Payer: Self-pay | Admitting: Family Medicine

## 2024-08-21 DIAGNOSIS — M79605 Pain in left leg: Secondary | ICD-10-CM

## 2024-09-01 ENCOUNTER — Encounter (INDEPENDENT_AMBULATORY_CARE_PROVIDER_SITE_OTHER): Payer: Self-pay | Admitting: Vascular Surgery

## 2024-09-01 ENCOUNTER — Other Ambulatory Visit (INDEPENDENT_AMBULATORY_CARE_PROVIDER_SITE_OTHER)

## 2024-09-01 ENCOUNTER — Ambulatory Visit (INDEPENDENT_AMBULATORY_CARE_PROVIDER_SITE_OTHER): Admitting: Vascular Surgery

## 2024-09-01 VITALS — BP 158/80 | HR 54 | Resp 18 | Ht 69.0 in | Wt 208.2 lb

## 2024-09-01 DIAGNOSIS — I70213 Atherosclerosis of native arteries of extremities with intermittent claudication, bilateral legs: Secondary | ICD-10-CM

## 2024-09-01 DIAGNOSIS — E782 Mixed hyperlipidemia: Secondary | ICD-10-CM | POA: Diagnosis not present

## 2024-09-01 DIAGNOSIS — I2510 Atherosclerotic heart disease of native coronary artery without angina pectoris: Secondary | ICD-10-CM

## 2024-09-01 DIAGNOSIS — I10 Essential (primary) hypertension: Secondary | ICD-10-CM | POA: Diagnosis not present

## 2024-09-01 LAB — VAS US ABI WITH/WO TBI
Left ABI: 1.2
Right ABI: 1.19

## 2024-09-02 ENCOUNTER — Encounter (INDEPENDENT_AMBULATORY_CARE_PROVIDER_SITE_OTHER): Payer: Self-pay | Admitting: Vascular Surgery

## 2024-09-02 NOTE — Progress Notes (Signed)
 MRN : [id number]  George Doyle is a 73 y.o. (05/23/1951) male who presents with chief complaint of check circulation.  History of Present Illness:   The patient returns to the office for followup and review status post angiogram with intervention on 05/13/2024.    Procedure:   Percutaneous transluminal angioplasty and stent placement left superficial femoral artery. 2.     Atherectomy using the R[place] Rex thromboatherectomy catheter left SFA.    The patient notes improvement in the lower extremity symptoms. No interval shortening of the patient's claudication distance or rest pain symptoms. No new ulcers or wounds have occurred since the last visit.  Both he and his wife do note that he has still not had complete healing of his fracture.    There have been no significant changes to the patient's overall health care.   No documented history of amaurosis fugax or recent TIA symptoms. There are no recent neurological changes noted. No documented history of DVT, PE or superficial thrombophlebitis. The patient denies recent episodes of angina or shortness of breath.   ABI's Rt=1.19 (triphasic) and Lt=1.20 (triphasic) (previous ABI's Rt=1.20 (triphasic) and Lt=1.12 (triphasic)).  Tib-fib plain films dated 08/21/2024 are reviewed by me and does indeed show evidence of healing of the proximal fibular fracture.  Active Medications[1]  Past Medical History:  Diagnosis Date   Allergy    Arthritis    knees, hands   Ascending aorta dilatation 07/21/2017   a.) TTE 07/21/2017: Ao root 36 mm, asc Ao 41 mm; b.) TTE 10/22/2020: asc Ao 39 mm   Coronary artery disease    Diastolic dysfunction 07/21/2017   a.) TTE 07/21/2017 (following NSTEMI): EF 50-55%, inf HK, mild MR, G1DD   Dyspnea    Former tobacco use    History of 2019 novel coronavirus disease (COVID-19) 10/21/2020   HLD (hyperlipidemia)    Hypertension     NSTEMI (non-ST elevated myocardial infarction) (HCC) 07/20/2017   a.) CP started 5 days prior to presentation (approx 07/15/2017); b.) LHC 07/23/2017: 100% p-d RCA, 60% p-mLCx (tortuous), 99% post atrio --> unsuccessful PCI; c.) referred to interventionist Laverna) in [place] on 08/30/2017 --> med mgmt recommended   NSTEMI (non-ST elevated myocardial infarction) (HCC) 10/23/2020   a.) Troponins trended: 307-->1304--> 6774 --> 6895 --> 5886 --> 5324; b.) LHC 10/23/2020: EF >55%; 40% pLCx, CTO pRCA --> med mgmt.   Pneumonia    SDH (subdural hematoma) (HCC) 03/27/2018   a.) 6 mm frontal SDH s/p fall related to tonic-clonic seizure   Seizures (HCC) 1970   a.) last documented seizure was in 10/2020   Umbilical hernia    Vasovagal syncope     Past Surgical History:  Procedure Laterality Date   BICEPT TENODESIS Right 07/18/2018   Procedure: BICEPS TENODESIS;  Surgeon: Edie Norleen PARAS, MD;  Location: [facility] ORS;  Service: Orthopedics;  Laterality: Right;   CARDIAC CATHETERIZATION     COLONOSCOPY  2011   cleared for 10 yrs   COLONOSCOPY WITH PROPOFOL  N/A 11/06/2016   Procedure: COLONOSCOPY WITH PROPOFOL ;  Surgeon: Rogelia Copping, MD;  Location: [facility];  Service: Endoscopy;  Laterality: N/A;   COLONOSCOPY WITH PROPOFOL  N/A 05/01/2023   Procedure: COLONOSCOPY WITH PROPOFOL ;  Surgeon: Jinny Carmine, MD;  Location: [facility] END[facility]COPY;  Service: Endoscopy;  Laterality: N/A;   CORONARY STENT INTERVENTION N/A 07/23/2017   Procedure: CORONARY STENT INTERVENTION;  Surgeon: Florencio Cara BIRCH, MD;  Location: [facility] INVASIVE CV LAB;  Service: Cardiovascular;  Laterality: N/A;   CYST EXCISION     Forearm   INGUINAL HERNIA REPAIR Left    INSERTION OF MESH  05/25/2022   Procedure: INSERTION OF MESH;  Surgeon: Desiderio Schanz, MD;  Location: [facility] ORS;  Service: General;;   KNEE SURGERY Bilateral    2 on R) 1 on L)   LEFT HEART CATH AND CORONARY ANGIOGRAPHY N/A 07/23/2017   Procedure: LEFT HEART CATH AND CORONARY  ANGIOGRAPHY;  Surgeon: Perla Evalene PARAS, MD;  Location: [facility];  Service: Cardiovascular;  Laterality: N/A;   LEFT HEART CATH AND CORONARY ANGIOGRAPHY Left 10/23/2020   Procedure: LEFT HEART CATH AND CORONARY ANGIOGRAPHY; Location: [facility]   LOWER EXTREMITY ANGIOGRAPHY Left 05/13/2024   Procedure: Lower Extremity Angiography;  Surgeon: Jama Cordella MATSU, MD;  Location: [facility] INV[facility] CV LAB;  Service: Cardiovascular;  Laterality: Left;   POLYPECTOMY  11/06/2016   Procedure: POLYPECTOMY;  Surgeon: Carmine Jinny, MD;  Location: [facility];  Service: Endoscopy;;   SHOULDER ARTHROSCOPY WITH OPEN ROTATOR CUFF REPAIR Right 07/18/2018   Procedure: SHOULDER ARTHROSCOPY WITH OPEN ROTATOR CUFF REPAIR;  Surgeon: Edie Norleen PARAS, MD;  Location: [facility] ORS;  Service: Orthopedics;  Laterality: Right;   TIBIA IM NAIL INSERTION Left 11/19/2023   Procedure: INTRAMEDULLARY (IM) NAIL TIBIAL;  Surgeon: Kendal Franky SQUIBB, MD;  Location: [facility] OR;  Service: Orthopedics;  Laterality: Left;    Social History Social History[2]  Family History Family History  Problem Relation Age of Onset   CAD Father    Lung cancer Father    Heart failure Mother     Allergies[3]   REVIEW OF SYSTEMS (Negative unless checked)  Constitutional: [] Weight loss  [] Fever  [] Chills Cardiac: [] Chest pain   [] Chest pressure   [] Palpitations   [] Shortness of breath when laying flat   [] Shortness of breath with exertion. Vascular:  [x] Pain in legs with walking   [] Pain in legs at rest  [] History of DVT   [] Phlebitis   [] Swelling in legs   [] Varicose veins   [] Non-healing ulcers Pulmonary:   [] Uses home oxygen   [] Productive cough   [] Hemoptysis   [] Wheeze  [] COPD   [] Asthma Neurologic:  [] Dizziness   [] Seizures   [] History of stroke   [] History of TIA  [] Aphasia   [] Vissual changes   [] Weakness or numbness in arm   [] Weakness or numbness in leg Musculoskeletal:   [] Joint swelling   [] Joint pain   [] Low back pain Hematologic:   [] Easy bruising  [] Easy bleeding   [] Hypercoagulable state   [] Anemic Gastrointestinal:  [] Diarrhea   [] Vomiting  [] Gastroesophageal reflux/heartburn   [] Difficulty swallowing. Genitourinary:  [] Chronic kidney disease   [] Difficult urination  [] Frequent urination   [] Blood in urine Skin:  [] Rashes   [] Ulcers  Psychological:  [] History of anxiety   []  History of major depression.  Physical Examination  Vitals:   09/01/24 1500  BP: (!) 158/80  Pulse: (!) 54  Resp: 18  Weight: 208 lb 3.2 oz (94.4 kg)  Height: 5' 9 (1.753 m)   Body mass index is 30.75 kg/m. Gen: WD/WN, NAD Head: [place]/AT, No temporalis wasting.  Ear/Nose/Throat:  Hearing grossly intact, nares w/o erythema or drainage Eyes: PER, EOMI, sclera nonicteric.  Neck: Supple, no masses.  No bruit or JVD.  Pulmonary:  Good air movement, no audible wheezing, no use of accessory muscles.  Cardiac: RRR, normal S1, S2, no Murmurs. Vascular:  mild trophic changes, no open wounds Vessel Right Left  Radial Palpable Palpable  PT Palpable Palpable  DP  Palpable  Palpable  Gastrointestinal: soft, non-distended. No guarding/no peritoneal signs.  Musculoskeletal: M/S 5/5 throughout.  No visible deformity.  Neurologic: CN 2-12 intact. Pain and light touch intact in extremities.  Symmetrical.  Speech is fluent. Motor exam as listed above. Psychiatric: Judgment intact, Mood & affect appropriate for pt's clinical situation. Dermatologic: No rashes or ulcers noted.  No changes consistent with cellulitis.   CBC Lab Results  Component Value Date   WBC 12.1 (H) 08/18/2024   HGB 13.8 08/18/2024   HCT 43.1 08/18/2024   MCV 96 08/18/2024   PLT 232 08/18/2024    BMET    Component Value Date/Time   NA 138 08/18/2024 1153   K 4.4 08/18/2024 1153   CL 99 08/18/2024 1153   CO2 25 08/18/2024 1153   GLUCOSE 50 (L) 08/18/2024 1153   GLUCOSE 117 (H) 11/20/2023 0811   BUN 17 08/18/2024 1153   CREATININE 0.97 08/18/2024 1153   CALCIUM  9.9  08/18/2024 1153   GFRNONAA >60 05/13/2024 0726   GFRAA 101 11/05/2020 1626   Estimated Creatinine Clearance: 74.6 mL/min (by C-G formula based on SCr of 0.97 mg/dL).  COAG Lab Results  Component Value Date   INR 0.98 07/20/2017    Radiology VAS US  ABI WITH/WO TBI Result Date: 09/01/2024  LOWER EXTREMITY DOPPLER STUDY Patient Name:  George Doyle  Date of Exam:   09/01/2024 Medical Rec #: [id number]                 Accession #:    [id number] Date of Birth: [birth year]                Patient Gender: M Patient Age:   82 years Exam Location:  [facility] Procedure:      VAS US  ABI WITH/WO TBI Referring Phys: [facility] --------------------------------------------------------------------------------  Indications: Rest pain, and peripheral artery disease. High Risk Factors: Past history of smoking, prior MI, coronary artery disease.  Vascular Interventions: 05/13/2024 left SFA atherectomy. Performing Technologist: Donnice Charnley RVT  Examination Guidelines: A complete evaluation includes at minimum, Doppler waveform signals and systolic blood pressure reading at the level of bilateral brachial, anterior tibial, and posterior tibial arteries, when vessel segments are accessible. Bilateral testing is considered an integral part of a complete examination. Photoelectric Plethysmograph (PPG) waveforms and toe systolic pressure readings are included as required and additional duplex testing as needed. Limited examinations for reoccurring indications may be performed as noted.  ABI Findings: +---------+------------------+-----+---------+--------+ Right    Rt Pressure (mmHg)IndexWaveform Comment  +---------+------------------+-----+---------+--------+ Brachial 159                                      +---------+------------------+-----+---------+--------+ PTA      195               1.19 triphasic         +---------+------------------+-----+---------+--------+ DP        193               1.18 triphasic         +---------+------------------+-----+---------+--------+  Great [id number]               0.69                   +---------+------------------+-----+---------+--------+ +---------+------------------+-----+---------+-------+ Left     Lt Pressure (mmHg)IndexWaveform Comment +---------+------------------+-----+---------+-------+ Brachial 164                                     +---------+------------------+-----+---------+-------+ PTA      130               0.79 triphasic        +---------+------------------+-----+---------+-------+ DP       196               1.20 triphasic        +---------+------------------+-----+---------+-------+ Great Toe125               0.76                  +---------+------------------+-----+---------+-------+ +-------+-----------+-----------+------------+------------+ ABI/TBIToday's ABIToday's TBIPrevious ABIPrevious TBI +-------+-----------+-----------+------------+------------+ Right  1.19       0.69       1.20        0.74         +-------+-----------+-----------+------------+------------+ Left   1.20       0.76       1.12        0.82         +-------+-----------+-----------+------------+------------+  Bilateral ABIs appear essentially unchanged compared to prior study on 06/02/2024.  Summary: Right: Resting right ankle-brachial index is within normal range. The right toe-brachial index is abnormal.  Left: Resting left ankle-brachial index is within normal range. The left toe-brachial index is normal.  *See table(s) above for measurements and observations.  Electronically signed by Cordella Shawl MD on 09/01/2024 at 4:27:49 PM.    Final    VAS US  LOWER EXTREMITY ARTERIAL DUPLEX Result Date: 09/01/2024 LOWER EXTREMITY ARTERIAL DUPLEX STUDY Patient Name:  George Doyle  Date of Exam:   09/01/2024 Medical Rec #: [id number]                 Accession #:    [id number] Date of Birth:  February 17, 1951                Patient Gender: M Patient Age:   55 years Exam Location:  [facility] Procedure:      VAS US  LOWER EXTREMITY ARTERIAL DUPLEX Referring Phys: CORDELLA SHAWL --------------------------------------------------------------------------------  Indications: Rest pain, and peripheral artery disease. High Risk Factors: Hypertension, past history of smoking, prior MI, coronary                    artery disease.  Vascular Interventions: 05/13/2024 left SFA atherectomy. Current ABI:            Right: 1.19, Left: 1.20 Performing Technologist: Donnice Charnley RVT  Examination Guidelines: A complete evaluation includes B-mode imaging, spectral Doppler, color Doppler, and power Doppler as needed of all accessible portions of each vessel. Bilateral testing is considered an integral part of a complete examination. Limited examinations for reoccurring indications may be performed as noted.   +----------+--------+-----+--------+---------+--------+ LEFT      PSV cm/sRatioStenosisWaveform Comments +----------+--------+-----+--------+---------+--------+ CFA Distal93                   triphasic         +----------+--------+-----+--------+---------+--------+ DFA  64                   biphasic          +----------+--------+-----+--------+---------+--------+ SFA Prox  60                   biphasic          +----------+--------+-----+--------+---------+--------+ SFA Mid   51                   biphasic stent    +----------+--------+-----+--------+---------+--------+ SFA Distal51                   triphasic         +----------+--------+-----+--------+---------+--------+ POP Prox  70                   biphasic          +----------+--------+-----+--------+---------+--------+ POP Distal56                   biphasic          +----------+--------+-----+--------+---------+--------+ ATA Distal30                   biphasic           +----------+--------+-----+--------+---------+--------+ PTA Distal29                   biphasic          +----------+--------+-----+--------+---------+--------+  Summary: Left: Patent stent with no evidence of stenosis in the superficial femoral artery. No hemodynamically significant stenosis identified.  See table(s) above for measurements and observations. Electronically signed by Cordella Shawl MD on 09/01/2024 at 4:27:43 PM.    Final    DG Tibia/Fibula Left Result Date: 08/21/2024 CLINICAL DATA:  Left lower leg pain.  Evaluate for osteomyelitis. EXAM: LEFT TIBIA AND FIBULA - 2 VIEW COMPARISON:  11/19/2023 FINDINGS: Intramedullary nail and associated screws bridges patient's distal diaphyseal fracture as the hardware is intact and unchanged. Healing oval fracture of the proximal fibular diaphysis. No evidence of bone destruction to suggest osteomyelitis. No air within the soft tissues. Remainder the exam is unchanged. IMPRESSION: 1. No acute findings. 2. ORIF distal tibial fracture without hardware complication. Healing proximal fibular diaphyseal fracture. Electronically Signed   By: Toribio Agreste M.D.   On: 08/21/2024 14:28     Assessment/Plan 1. Atherosclerosis of native artery of both lower extremities with intermittent claudication (Primary) Recommend:  The patient is status post successful angiogram with intervention.  The patient reports that the claudication symptoms and leg pain has improved.   The patient denies lifestyle limiting changes at this point in time.  Although the patient noted that his fracture was not healed plain radiographs do demonstrate bone healing and improvement compared to the images from preangiography and intervention.  No further invasive studies, angiography or surgery at this time. The patient should continue walking and begin a more formal exercise program.  The patient should continue antiplatelet therapy and aggressive treatment of the lipid  abnormalities  Continued surveillance is indicated as atherosclerosis is likely to progress with time.    Patient should undergo noninvasive studies as ordered. The patient will follow up with me to review the studies.  - VAS US  ABI WITH/WO TBI; Future  2. Coronary artery disease involving native coronary artery of native heart without angina pectoris Continue cardiac and antihypertensive medications as already ordered and reviewed, no changes at this time.  Continue statin as ordered and reviewed, no  changes at this time  Nitrates PRN for chest pain  3. Essential hypertension Continue antihypertensive medications as already ordered, these medications have been reviewed and there are no changes at this time.  4. Mixed hyperlipidemia Continue statin as ordered and reviewed, no changes at this time    Cordella Shawl, MD  09/02/2024 12:32 PM      [1]  Current Meds  Medication Sig   acetaminophen  (TYLENOL ) 500 MG tablet Take 2 tablets (1,000 mg total) by mouth every 6 (six) hours as needed for mild pain.   albuterol  (VENTOLIN  HFA) 108 (90 Base) MCG/ACT inhaler Inhale 2 puffs into the lungs every 6 (six) hours as needed for wheezing or shortness of breath.   aspirin  EC 81 MG EC tablet Take 1 tablet (81 mg total) daily by mouth.   atorvastatin  (LIPITOR ) 80 MG tablet TAKE 1 TABLET(80 MG) BY MOUTH DAILY AT 6 PM   carvedilol  (COREG ) 3.125 MG tablet TAKE 1 TABLET BY MOUTH 2 TIMES DAILY.   citalopram  (CELEXA ) 20 MG tablet Take 20 mg by mouth at bedtime.   clopidogrel  (PLAVIX ) 75 MG tablet Take 1 tablet (75 mg total) by mouth daily.   donepezil  (ARICEPT ) 5 MG tablet Take 5 mg by mouth at bedtime.   EPINEPHrine  0.3 mg/0.3 mL IJ SOAJ injection Inject 0.3 mg into the muscle as needed for anaphylaxis.   fexofenadine (ALLEGRA) 180 MG tablet Take 180 mg by mouth as needed for allergies or rhinitis. otc   HYDROcodone-acetaminophen  (NORCO/VICODIN) 5-325 MG tablet Take 1 tablet by mouth every  8 (eight) hours as needed.   lamoTRIgine  (LAMICTAL ) 200 MG tablet Take 200 mg by mouth 2 (two) times daily.   losartan  (COZAAR ) 50 MG tablet TAKE 1 TABLET BY MOUTH TWICE A DAY   methocarbamol  (ROBAXIN ) 500 MG tablet Take 1 tablet (500 mg total) by mouth every 8 (eight) hours as needed for muscle spasms.   nitroGLYCERIN  (NITROSTAT ) 0.4 MG SL tablet PLACE 1 TABLET UNDER THE TONGUE EVERY 5 MINUTES FOR 3 DOSES AS NEEDED FOR CHEST PAIN   spironolactone  (ALDACTONE ) 25 MG tablet Take 1 tablet (25 mg total) by mouth daily.  [2]  Social History Tobacco Use   Smoking status: Former    Current packs/day: 0.00    Average packs/day: 1 pack/day for 14.0 years (14.0 ttl pk-yrs)    Types: Cigarettes    Start date: 28    Quit date: 21    Years since quitting: 34.9   Smokeless tobacco: Never   Tobacco comments:    quit over 20 yrs ago (~1991)  Vaping Use   Vaping status: Never Used  Substance Use Topics   Alcohol use: Not Currently    Comment: 1-2 drinks/year   Drug use: No  [3] No Known Allergies

## 2024-09-03 NOTE — Progress Notes (Signed)
 Today the history is gathered from: 60% - patient  40% - patient's wife again  RECORDS SUMMARY: No new outside records on file.       REFERRING PHYSICIAN: Pcp PRIMARY CARE PHYSICIAN:  Joshua Cathryne Rodney, MD   IMPRESSION/PLAN  George Doyle is a 73 y.o.  male presenting for evaluation of  HISTORY OF SEIZURE / SUBDURAL HEMATOMA/ HTN/ VASOVAGAL SYNCOPE/ MILD COGNITIVE - IMPAIRMENT/ SENSORY ATAXIA/  - Stable. - Assessment & Plan: 1. Cognitive Impairment with Short-term Memory Deficits - Assessment reveals mild cognitive impairment characterized by short-term memory difficulties while long-term memory remains preserved. Memory evaluation score of 28 out of 30 indicates cognitive function is relatively maintained despite subjective complaints. The pattern of word-finding difficulties, problems with names and faces, and occasional misplacing of items is consistent with early cognitive decline. Current ability to drive safely and use tools appropriately suggests functional independence is largely maintained. - Current treatment with Aricept  5 milligrams at bedtime appears appropriate for cholinesterase inhibition to support cognitive function. Wife's assistance with medication management ensures proper compliance and safety. Continued monitoring of cognitive status will be important to track progression and adjust treatment as needed. - Physical therapy participation for strength training and gait stability is beneficial for overall functional maintenance and fall prevention. Occasional cane use demonstrates appropriate safety awareness for mobility concerns.  2. Peripheral Vascular Disease with Recent Intervention - Recent leg surgery with placement of two stents indicates significant peripheral vascular disease requiring intervention. Patient reports substantial improvement following stent placement, suggesting successful revascularization of affected vessels. Left leg was the surgical site with  ongoing recovery progressing well. - Current baby aspirin  81 milligrams daily provides appropriate antiplatelet therapy for secondary prevention of vascular events. This medication regimen supports long-term vascular health and reduces risk of future complications. - Continued monitoring of vascular status and surgical site healing will be important for ongoing care. Patient should maintain regular follow-up with vascular surgery team for post-procedural care and surveillance.  3. Mood and Seizure Management - Current Celexa  20 milligrams once daily provides mood stabilization and addresses any underlying depression that may accompany cognitive changes. Lamictal  200 milligrams twice daily continues seizure prevention with no recent seizure activity reported, indicating good seizure control. - Medication regimen appears well-tolerated with no adverse effects reported. Wife's assistance with medication management ensures proper dosing and timing for optimal therapeutic benefit.  Tasks: - Maintain physical therapy for strength and balance - Monitor cognitive status progression - Follow up with vascular surgery team - Continue Aricept  5 mg nightly for memory loss. Refill. - Continue Celexa  20 mg daily for mood. Refill. - Continue Lamictal  200 mg twice daily for seizure like activity. Refill. - Continue Aspirin  81 mg daily for secondary stroke prevention.  -  If experiencing stroke like symptoms instructed patient to call 911 as soon as symptoms start to be considered a possible candidate for tPA. Common stroke like symptoms and signs to look out for are one sided weakness, facial droop, slurred speech, dizziness, imbalance, vision changes, confusion. Patient acknowledged understanding.   Follow up with Dr. Lane in  4-6 months.   CHIEF COMPLAIN & HISTORY OF PRESENT ILLNESS:  George Doyle is a 73 y.o. male presenting for evaluation of: Chief Complaint  Patient presents with   HISTORY OF SEIZURE  / SUBDURAL HEMATOMA   HTN/ VASOVAGAL SYNCOPE   MILD COGNITIVE IMPAIRMENT/ SENSORY ATAXIA/     HISTORY OF SEIZURE / SUBDURAL HEMATOMA/ HTN/ VASOVAGAL SYNCOPE/ MILD COGNITIVE IMPAIRMENT/ SENSORY ATAXIA Subjective: -  Presenting with known new short-term memory concerns while long-term memory remains intact. Experiencing occasional difficulty with words, names, faces, and locations. Currently still driving without difficulty operating the vehicle. Reports misplacing items at times but maintains ability to use tools and devices effectively. Wife has taken over medication management responsibilities. - No recent seizure activity or stroke-like episodes reported. Currently participating in physical therapy for strength training due to imbalance and stable gait pattern. Uses a cane occasionally for ambulation support. Denies numbness, tingling, or burning sensations in upper and lower extremities. - Current medications include Celexa  20 milligrams once daily for mood management, Lamictal  200 milligrams twice daily for seizure prevention, baby Aspirin  81 milligrams daily for cardiovascular protection, and Aricept  5 milligrams at bedtime for cognitive enhancement. - Taking Celexa  20 mg daily, Lamictal  200 mg BID, Aspirin  81 mg daily, Aricept  5 mg nightly. Memory evaluation today is 28/30.   DATA SUMMARY: 09/26/2023 CT ANGIO HEAD NECK W WO CM IMPRESSION:  1. No acute intracranial process. The acute infarct noted on the  09/25/2023 MRI is not apparent on CT.  2. 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  3. Moderate stenosis at the origin of the left vertebral artery.  Mild stenosis in the right V2 segment, secondary to degenerative  changes in the cervical spine.  4. No intracranial large vessel occlusion. Moderate stenosis in the  bilateral supraclinoid ICAs, moderate stenosis in the anterior   cerebral arteries, and moderate to severe stenosis in the more  distal right V4.  5. 90% stenosis of the proximal right subclavian artery.  6. Aortic atherosclerosis.   09/25/2023 MRI BRAIN W WO IMPRESSION:  1. Small acute infarct in the posterior pons.  2. Unchanged 8 mm enhancing focus in the left parietal skull, which  is nonspecific but favored to represent a small hemangioma.   03/01/2021 MRI W AND WO CONTRAST  IMPRESSION: 1. No acute intracranial abnormality. 2. Chronic lacunar infarcts in the pons and left caudate head. 3. 7 mm focus of calcification/ossification along the inner table of the right frontal skull favored to reflect a small insignificant exostosis rather than a meningioma. 4. 8 mm enhancing left parietal skull lesion, not significantly changed in size from 2019 and favored to reflect a benign hemangioma.  11/18/2020 ROUTINE EEG  IMPRESSION: This routine EEG in the awake and asleep states is within normal limits.  10/21/2020  MRI BRAIN WO Impression:    1.    No acute infarct, acute intracranial hemorrhage, midline shift, or mass effect. Normal appearance of the hippocampi. No definite evidence of neuronal migration abnormality on mildly motion degraded study.  2.    Small 7 mm right frontal partially calcified extra-axial lesion with probable mild enhancement but no appreciable mass effect or underlying parenchymal edema. Findings likely reflect a meningioma.  3.    Thin asymmetric right frontoparietal dural enhancement. Findings may be secondary to dural enhancement in the setting of probable right frontal meningioma, although the extent of this enhancement is slightly greater than expected given small size of meningioma. Additional less likely differentials include neurosarcoidosis, granulomatous disease, or metastases.  4.    Enhancing left parietal calvarial lesion without bony destruction, nonspecific but possibly atypical intraosseous hemangioma in the  absence of known malignancy.   10/21/2020 EEG VIDEO MONITORING CLINICAL CORRELATION / SUMMARY  Normal waking and sleep EEG  No seizures or epileptiform discharges  10/21/2020  CT CERVICAL SPINE IMPRESSION:  1. No acute fracture or malalignment.  2. Cervical spondylosis. Multilevel neural foraminal narrowing.    10/21/2020  CT HEAD WO IMPRESSION:  1. No acute intracranial traumatic injury.  2. Intracranial atherosclerosis.  3. Right pontine small old lacunar infarction.   07/26/2018 EEG EXTENDED IMPRESSION: This 72 hour ambulatory EEG with video in the awake and asleep states is within normal limits.  06/06/18  ROUTINE EEG IMPRESSION: This routine EEG in the awake and asleep states is within normal limits.  03/27/18  CT HEAD WO CONTRAST IMPRESSION: 6 mm right frontal subdural hematoma. Mixed density blood suggesting acute and subacute hemorrhage. Mild interhemispheric subdural hematoma. No midline shift.  VISIT SUMMARIES: 06/21/21: Patient with history of seizures, stable. Continue lamictal  200 mg twice daily. Advised patient to complete balance exercises for 5 minutes daily.   03/14/21: Patient with history of seizures. MRI June did not show any major malignant abnormalities. Taking Lamictal  and Keppra . Provided directions to increase Lamictal  and decrease, then stop Keppra . Will check Lamictal  and Keppra  levels today.   01/25/21: Patient with seizures, stable. Increase lamictal  to 100mg  in the morning and 150mg  at night for two weeks, then increase to 150mg  twice daily.  After you have been on increased dose of lamictal , can start decreasing Keppra . Decrease Keppra  to 500mg  in the morning and 750mg  at night for two weeks, then decrease to 500mg  twice daily.Will plan to check lamictal  levels at next visit.  Brain MRI scheduled for June.  06/18/18: Seizures/subdural hematoma stable. Continue taking Keppra  500 mg twice daily. Continue taking Nortriptyline 10 mg nightly for  headaches. Continue taking aspirin  81 mg and Plavix  75 mg daily for secondary stroke prevention. Order 72 hour EEG to evaluate for seizures. Will plan to discontinue Keppra  if these results are unremarkable. Advised patient to check blood pressure twice daily for 1 week and call physician with results.   05/09/18: Seizures/subdural hematoma, new to me. Patient with history of seizures and recent seizure/subdural hematoma. Admitted to ED on 03/27/18 for loss of consciousness. Testing revealed subdural hematoma and seizure episode. Medical history significant for CAD and hypertension. Patient reports that the out of the last five weeks since the seizure, he has had only 3 headache free days. Symptoms most consistent with post concussion syndrome. Decrease Keppra  to 500 mg in the morning and 1000 mg at night for one week. Then decrease to 500 mg twice daily for seizures, and continue this dosage. Start taking Aspirin  81 mg on Monday, Wednesday, and Friday for blood thinning. Start taking Plavix  daily for blood thinning. Start Nortriptyline 10 mg at night for one week then increase to 20 mg at night for headaches. Encouraged patient to monitor blood pressure at home to establish a baseline. Schedule routine EEG to evaluate for a seizure disorder. Following results of EEG, we will consider discontinuing Keppra  and starting Lamictal .     MEDICATIONS Current Outpatient Medications  Medication Sig Dispense Refill   acetaminophen  (TYLENOL ) 500 MG tablet Take 1,000 mg by mouth as needed for Pain     aspirin  81 MG EC tablet Take 81 mg by mouth once daily In the morning     atorvastatin  (LIPITOR ) 80 MG tablet Take 80 mg by mouth every evening        carvediloL  (COREG ) 3.125 MG tablet Take 3.125 mg by mouth 2 (two) times daily with meals     citalopram  (CELEXA ) 20 MG tablet TAKE 1 TABLET BY MOUTH EVERY DAY 90  tablet 0   donepeziL  (ARICEPT ) 5 MG tablet Take 5 mg by mouth at bedtime     donepeziL  (ARICEPT ) 5  MG tablet TAKE 1 TABLET (5 MG TOTAL) BY MOUTH AT BEDTIME FOR 180 DAYS 90 tablet 1   EPINEPHrine  (EPIPEN ) 0.3 mg/0.3 mL auto-injector Inject 0.3 mg into the muscle as needed for Anaphylaxis     fexofenadine (ALLEGRA) 180 MG tablet Take 180 mg by mouth once daily as needed     lamoTRIgine  (LAMICTAL ) 200 MG tablet Take 1 tablet (200 mg total) by mouth 2 (two) times daily 180 tablet 0   lamoTRIgine  (LAMICTAL ) 200 MG tablet Take 1 tablet (200 mg total) by mouth 2 (two) times daily 60 tablet 5   losartan  (COZAAR ) 50 MG tablet Take 50 mg by mouth 2 (two) times daily     methocarbamoL  (ROBAXIN ) 500 MG tablet Take 500 mg by mouth every 8 (eight) hours as needed     nitroGLYcerin  (NITROSTAT ) 0.4 MG SL tablet Place under the tongue     oxyCODONE  (ROXICODONE ) 5 MG immediate release tablet Take 5 mg by mouth every 4 (four) hours as needed for Pain     polyethylene glycol (MIRALAX ) powder Take 17 g by mouth once daily Mix in 4-8ounces of fluid prior to taking.     spironolactone  (ALDACTONE ) 25 MG tablet Take 25 mg by mouth once daily     albuterol  90 mcg/actuation inhaler Inhale 2 inhalations into the lungs (Patient not taking: Reported on 01/01/2024)     clopidogreL  (PLAVIX ) 75 mg tablet Take 75 mg once daily for 21 days and then discontinue.  Monitor for signs of bleeding or bruising. (Patient not taking: Reported on 09/03/2024) 21 tablet 0   diazePAM (VALIUM) 2 MG tablet Take 2mg  30-60 minutes prior to procedure. Can repeat additional 2mg  immediately prior to procedure if needed. (Patient not taking: Reported on 03/14/2021) 2 tablet 0   diclofenac  (VOLTAREN ) 1 % topical gel Apply 2 g topically 4 (four) times daily (Patient not taking: Reported on 06/21/2022)     hydroCHLOROthiazide  (HYDRODIURIL ) 25 MG tablet Take by mouth Take 1 tablet (25 mg total) by mouth daily. (Patient not taking: Reported on 11/09/2020)     latanoprost (XALATAN) 0.005 % ophthalmic solution Place 1 drop into both eyes at  bedtime (Patient not taking: Reported on 09/03/2024)     levETIRAcetam  (KEPPRA ) 500 MG tablet After you have been on increased dose of lamictal , can start decreasing Keppra . Decrease Keppra  to 500mg  in the morning and 750mg  at night for two weeks, then decrease to 500mg  twice daily. (Patient not taking: Reported on 06/21/2021) 60 tablet 2   loratadine  (CLARITIN ) 10 mg tablet Take 10 mg by mouth as needed for Allergies (Patient not taking: Reported on 03/14/2021)     losartan  (COZAAR ) 100 MG tablet Take 50 mg by mouth once daily (Patient not taking: Reported on 09/03/2024)     metoprolol  tartrate (LOPRESSOR ) 50 MG tablet Take 1 tablet by mouth 2 (two) times daily (Patient not taking: Reported on 01/25/2021)     nortriptyline (PAMELOR) 10 MG capsule Take 20 mg at night for one month, then decrease to 10 mg at night for one month and then discontinue for post concussion syndrome. (Patient not taking: Reported on 09/03/2024) 90 capsule 0   ramipril  (ALTACE ) 10 MG capsule Take 10 mg by mouth 2 (two) times daily   (Patient not taking: Reported on 11/09/2020)     No current facility-administered medications for this visit.  ALLERGIES No Known Allergies   EXAM   Vitals:   09/03/24 1243  BP: 134/70  Weight: 94.3 kg (208 lb)  Height: 175.3 cm (5' 9)  PainSc: 0-No pain    MEMORY EVALUATION: 09/03/2024 - 28/30 04/01/2024 - 30/30 09/26/2023 - 30/30 03/26/2023 - 29/30 09/25/2022 29/30  Body mass index is 30.72 kg/m.   GENERAL: Very pleasant male, in nad. Normocephalic and atraumatic.  EYES: PERRLA EOM'S Intact.  MUSCULOSKELETAL: Bulk - Normal Tone - Normal Pronator Drift - Not assessed. Ambulation - Gait and station is moderately unsteady. Decreased right arm swing, no observed tremors. Limp on left leg noted on exam. Romberg - Not assessed.  R/L 5/4    Shoulder abduction (deltoid/supraspinatus, axillary/suprascapular n, C5) 5/4     Elbow flexion (biceps brachii, musculoskeletal  n, C5-6) 5/4     Elbow extension (triceps, radial n, C7) 5/4     Finger adduction (interossei, ulnar n, T1)  5/3    Hip flexion (iliopsoas, L1/L2) 5/5    Knee flexion (hamstrings, sciatic n, L5/S1)  5/5    Knee extension (quadriceps, femoral n, L3/4) 5/5    Ankle dorsiflexion (tibialis anterior, deep fibular n, L4/5) 5/5    Ankle plantarflexion (gastroc, tibial n, S1)   NEUROLOGICAL: MENTAL STATUS: Patient is oriented to person, place and time.   Short-term memory is intact. Long-term memory is intact.   Attention span and concentration are intact.   Naming and repetition are intact. Comprehension is intact.   Expressive speech is intact.   Patient's fund of knowledge is within normal limits for educational level.  CRANIAL NERVES: Visual acuity and visual fields are intact         Extraocular muscles are intact                        Facial sensation is intact bilaterally                Facial strength is intact bilaterally                   Hearing is intact bilaterally                              Palate elevates midline, normal phonation     Shoulder shrug strength is intact                    Tongue protrudes midline                       SENSATION: Pain and temperature (spinothalamic tracts) is normal. Position and vibration (dorsal columns) is normal.  COORDINATION/CEREBELLAR: Finger to nose testing is mildly diminished.     PAST MEDICAL HISTORY Past Medical History:  Diagnosis Date   CAD (coronary artery disease)    Circulatory disorder    GERD (gastroesophageal reflux disease)    occasionally   Heart disease    Hypertension    Seizures (CMS/HHS-HCC)     PAST SURGICAL HISTORY Past Surgical History:  Procedure Laterality Date   KNEE ARTHROSCOPY Left 1997   INSERTION ARTERIAL CATH BY CUTDOWN LEG Right 07/2017   Limited arthroscopic debidement arthroscopic subacromial decompression mini-open rotator cuff repair with a smith [employer] nephew regeneten patch  and mini-open biceps tenodesis,right shoulder  Right 07/18/2018   Dr.Poggi    CAPSULOTOMY WRIST Left 04/01/2019   Procedure: RADICAL RESECTION OF TUMOR SOFT  TISSUE OF FOREARM;  Surgeon: Harry Alm Shaw, MD;  Location: [facility] OR;  Service: Orthopedics;  Laterality: Left;   INTRAMEDULLARY (IM) NAIL TIBIAL Left 11/19/2023   ANGIOGRAPHY LOWER EXTREMITY  05/13/2024   HERNIA REPAIR     KNEE ARTHROSCOPY Right 1980's   KNEE ARTHROSCOPY Right 1980's   VASECTOMY      FAMILY HISTORY Family History  Problem Relation Name Age of Onset   Coronary Artery Disease (Blocked arteries around heart) Mother clarkie    Heart disease Mother clarkie    Cancer Father henry    Lung cancer Father henry    Anesthesia problems Neg Hx      SOCIAL HISTORY  Social History   Tobacco Use   Smoking status: Former    Current packs/day: 1.00    Average packs/day: 1 pack/day for 15.0 years (15.0 ttl pk-yrs)    Types: Cigarettes   Smokeless tobacco: Never  Vaping Use   Vaping status: Never Used  Substance Use Topics   Alcohol use: Yes   Drug use: Never     REVIEW OF SYSTEMS:  13 system ROS form was given to the patient to complete and I have reviewed it.  The form was sent for scan to the patient's EHR.  Pertinent positives and negatives are mentioned above in the HPI and all other systems are negative.   DATA  I have personally reviewed all of the data outlined below both prior to the appointment and during the appointment with the patient as appropriate.  03/27/18 CT HEAD WO CONTRAST CLINICAL DATA:  Seizure  EXAM: CT HEAD WITHOUT CONTRAST  TECHNIQUE: Contiguous axial images were obtained from the base of the skull through the vertex without intravenous contrast.  COMPARISON:  None.  FINDINGS: Brain: Right frontal subdural hematoma measuring up to 6 mm in thickness. Mixed density but predominantly high-density acute blood. Small interhemispheric subdural hematoma anteriorly and  posteriorly also is high-density acute blood.  Ventricle size normal. No acute infarct. No midline shift. No mass or edema.  Vascular: Negative for hyperdense vessel  Skull: Negative for skull fracture  Sinuses/Orbits: Negative  Other: None  IMPRESSION: 6 mm right frontal subdural hematoma. Mixed density blood suggesting acute and subacute hemorrhage. Mild interhemispheric subdural hematoma. No midline shift. __________________________________________________   No visits with results within 6 Month(s) from this visit.  Latest known visit with results is:  Office Visit on 03/14/2021  Component Date Value Ref Range Status   Levetiracetam , S - [facility] 03/14/2021 14.8  10.0 - 40.0 ug/mL Final   Lamotrigine  Lvl - [facility] 03/14/2021 9.0  2.0 - 20.0 ug/mL Final    No follow-ups on file.  Payor: [employer] MEDICARE ADVANTAGE / Plan: [employer] PPO MEDICARE ADVANTAGE / Product Type: PPO /    This note is partially written by Greig Pouch, scribe, in the presence of and acting as the scribe of Dr. Arthea Farrow.   I have reviewed, edited and added to the note as needed to reflect my best personal medical judgment.    Dr. Arthea Farrow, MD [facility] [place], [place] Ph:  [phone] Fax:  [phone]

## 2024-10-10 ENCOUNTER — Other Ambulatory Visit (INDEPENDENT_AMBULATORY_CARE_PROVIDER_SITE_OTHER): Payer: Self-pay | Admitting: Vascular Surgery

## 2024-10-10 ENCOUNTER — Other Ambulatory Visit: Payer: Self-pay | Admitting: Cardiovascular Disease

## 2024-10-10 DIAGNOSIS — I10 Essential (primary) hypertension: Secondary | ICD-10-CM

## 2024-10-19 NOTE — Progress Notes (Unsigned)
 Cardiology Office Note  Date:  10/19/2024   ID:  George Doyle, DOB 10-16-1950, MRN [id number]  PCP:  Kotturi, Vinay K, MD   No chief complaint on file.   HPI:  George Doyle is a pleasant 74 yo male with history of  Seizure disorder 2005 in July 2019 HTN  former tobacco abuse  admitted with chest pain 07/21/2017 NSTEMI, TNT 14 Cath 07/23/2017  , Occluded proximal RCA, collaterals from left to right, Moderate to severe mid Left circumflex dz, tortuous vessel, calcified Moderate inferior wall hypokinesis attempt PCI to the RCA was unsuccessful, possibly went subintimal Seen by Dr. Dann August 30, 2017 Medical management recommended of his occluded RCA He presents for follow-up of his coronary artery disease, stable angina, preop cardiovascular  Last seen in clinic by myself 2/25   Presents today with his wife [name] developed severe vomiting 3 am until next day Next day reported having double vision, feeling dizzy, having trouble walking in house, needed to walk with a walker Better 2 days later [facility] ophthalmology concern for stroke  Saw neurology Head MRI: Showed small stroke Small acute infarct in the posterior pons.  CT scan head neck detailing vascular disease as below 50% stenosis in the proximal left ICA, with a subsequent medially directed outpouching that measures 6 x 4 x 5 mm, most likely a pseudoaneurysm. Moderate stenosis at the origin of the left vertebral artery. Mild stenosis in the right V2 segment, secondary to degenerative changes in the cervical spine. 4. No intracranial large vessel occlusion. Moderate stenosis in the bilateral supraclinoid ICAs, moderate stenosis in the anterior cerebral arteries, and moderate to severe stenosis in the more distal right V4. 5. 90% stenosis of the proximal right subclavian artery.  Currently feels back to his baseline Reports he was treated with Plavix  for 21 days now just on aspirin  alone No  regular exercise program  Lab work reviewed Total cholesterol 103, LDL 31  EKG personally reviewed by myself on todays visit      Other past medical history reviewed  emergency room March 27, 2018 for seizure  full body tonic-clonic seizure-like activity, hurt his right shoulder at that time CT scan head subdural hematoma, Received IV Keppra .   was transferred [facility]  Echo November 2018  ejection fraction was in the range of 50% to 55%. Aorta: Aortic root was mildly dilated, 3.6 cm - Ascending aorta: The ascending aorta was mildly dilated, 4.1 cm   PMH:   has a past medical history of Allergy, Arthritis, Ascending aorta dilatation (07/21/2017), Coronary artery disease, Diastolic dysfunction (07/21/2017), Dyspnea, Former tobacco use, History of 2019 novel coronavirus disease (COVID-19) (10/21/2020), HLD (hyperlipidemia), Hypertension, NSTEMI (non-ST elevated myocardial infarction) (HCC) (07/20/2017), NSTEMI (non-ST elevated myocardial infarction) (HCC) (10/23/2020), Pneumonia, SDH (subdural hematoma) (HCC) (03/27/2018), Seizures (HCC) (1970), Umbilical hernia, and Vasovagal syncope.  PSH:    Past Surgical History:  Procedure Laterality Date   BICEPT TENODESIS Right 07/18/2018   Procedure: BICEPS TENODESIS;  Surgeon: Edie Norleen PARAS, MD;  Location: [facility];  Service: Orthopedics;  Laterality: Right;   CARDIAC CATHETERIZATION     COLONOSCOPY  2011   cleared for 10 yrs   COLONOSCOPY WITH PROPOFOL  N/A 11/06/2016   Procedure: COLONOSCOPY WITH PROPOFOL ;  Surgeon: Rogelia Copping, MD;  Location: [facility];  Service: Endoscopy;  Laterality: N/A;   COLONOSCOPY WITH PROPOFOL  N/A 05/01/2023   Procedure: COLONOSCOPY WITH PROPOFOL ;  Surgeon: Copping Rogelia, MD;  Location: [facility];  Service: Endoscopy;  Laterality: N/A;   CORONARY STENT INTERVENTION N/A 07/23/2017   Procedure: CORONARY STENT INTERVENTION;  Surgeon: Florencio Cara BIRCH, MD;  Location: [facility];   Service: Cardiovascular;  Laterality: N/A;   CYST EXCISION     Forearm   INGUINAL HERNIA REPAIR Left    INSERTION OF MESH  05/25/2022   Procedure: INSERTION OF MESH;  Surgeon: Desiderio Schanz, MD;  Location: [facility];  Service: General;;   KNEE SURGERY Bilateral    2 on R) 1 on L)   LEFT HEART CATH AND CORONARY ANGIOGRAPHY N/A 07/23/2017   Procedure: LEFT HEART CATH AND CORONARY ANGIOGRAPHY;  Surgeon: Perla Evalene PARAS, MD;  Location: [facility];  Service: Cardiovascular;  Laterality: N/A;   LEFT HEART CATH AND CORONARY ANGIOGRAPHY Left 10/23/2020   Procedure: LEFT HEART CATH AND CORONARY ANGIOGRAPHY; Location: [facility]   LOWER EXTREMITY ANGIOGRAPHY Left 05/13/2024   Procedure: Lower Extremity Angiography;  Surgeon: Jama Cordella MATSU, MD;  Location: [facility];  Service: Cardiovascular;  Laterality: Left;   POLYPECTOMY  11/06/2016   Procedure: POLYPECTOMY;  Surgeon: Rogelia Copping, MD;  Location: [facility];  Service: Endoscopy;;   SHOULDER ARTHROSCOPY WITH OPEN ROTATOR CUFF REPAIR Right 07/18/2018   Procedure: SHOULDER ARTHROSCOPY WITH OPEN ROTATOR CUFF REPAIR;  Surgeon: Edie Norleen PARAS, MD;  Location: [facility];  Service: Orthopedics;  Laterality: Right;   TIBIA IM NAIL INSERTION Left 11/19/2023   Procedure: INTRAMEDULLARY (IM) NAIL TIBIAL;  Surgeon: Kendal Franky SQUIBB, MD;  Location: [facility];  Service: Orthopedics;  Laterality: Left;    Current Outpatient Medications  Medication Sig Dispense Refill   acetaminophen  (TYLENOL ) 500 MG tablet Take 2 tablets (1,000 mg total) by mouth every 6 (six) hours as needed for mild pain.     albuterol  (VENTOLIN  HFA) 108 (90 Base) MCG/ACT inhaler Inhale 2 puffs into the lungs every 6 (six) hours as needed for wheezing or shortness of breath. 8 g 2   aspirin  EC 81 MG EC tablet Take 1 tablet (81 mg total) daily by mouth. 30 tablet 1   atorvastatin  (LIPITOR ) 80 MG tablet TAKE 1 TABLET(80 MG) BY MOUTH DAILY AT 6 PM 90 tablet 2   carvedilol  (COREG )  3.125 MG tablet TAKE 1 TABLET BY MOUTH 2 TIMES DAILY. 180 tablet 2   citalopram  (CELEXA ) 20 MG tablet Take 20 mg by mouth at bedtime.     clopidogrel  (PLAVIX ) 75 MG tablet TAKE 1 TABLET BY MOUTH EVERY DAY 30 tablet 5   donepezil  (ARICEPT ) 5 MG tablet Take 5 mg by mouth at bedtime.     EPINEPHrine  0.3 mg/0.3 mL IJ SOAJ injection Inject 0.3 mg into the muscle as needed for anaphylaxis. 1 each 1   fexofenadine (ALLEGRA) 180 MG tablet Take 180 mg by mouth as needed for allergies or rhinitis. otc     HYDROcodone-acetaminophen  (NORCO/VICODIN) 5-325 MG tablet Take 1 tablet by mouth every 8 (eight) hours as needed.     lamoTRIgine  (LAMICTAL ) 200 MG tablet Take 200 mg by mouth 2 (two) times daily.     losartan  (COZAAR ) 50 MG tablet TAKE 1 TABLET BY MOUTH TWICE A DAY 180 tablet 2   methocarbamol  (ROBAXIN ) 500 MG tablet Take 1 tablet (500 mg total) by mouth every 8 (eight) hours as needed for muscle spasms. 20 tablet 0   nitroGLYCERIN  (NITROSTAT ) 0.4 MG SL tablet PLACE 1 TABLET UNDER THE TONGUE EVERY 5 MINUTES FOR 3 DOSES AS NEEDED FOR CHEST PAIN 25 tablet 0  spironolactone  (ALDACTONE ) 25 MG tablet TAKE 1 TABLET (25 MG TOTAL) BY MOUTH DAILY. 90 tablet 2   No current facility-administered medications for this visit.    Allergies:   Patient has no known allergies.   Social History:  The patient  reports that he quit smoking about 35 years ago. His smoking use included cigarettes. He started smoking about 49 years ago. He has a 14 pack-year smoking history. He has never used smokeless tobacco. He reports that he does not currently use alcohol. He reports that he does not use drugs.   Family History:   family history includes CAD in his father; Heart failure in his mother; Lung cancer in his father.   Review of Systems: Review of Systems  Constitutional: Negative.   HENT: Negative.    Respiratory: Negative.    Cardiovascular: Negative.   Gastrointestinal: Negative.   Musculoskeletal: Negative.    Neurological: Negative.   Psychiatric/Behavioral: Negative.    All other systems reviewed and are negative.  PHYSICAL EXAM: VS:  There were no vitals taken for this visit. , BMI There is no height or weight on file to calculate BMI. Constitutional:  oriented to person, place, and time. No distress.  HENT:  Head: Grossly normal Eyes:  no discharge. No scleral icterus.  Neck: No JVD, no carotid bruits  Cardiovascular: Regular rate and rhythm, no murmurs appreciated Pulmonary/Chest: Clear to auscultation bilaterally, no wheezes or rails Abdominal: Soft.  no distension.  no tenderness.  Musculoskeletal: Normal range of motion Neurological:  normal muscle tone. Coordination normal. No atrophy Skin: Skin warm and dry Psychiatric: normal affect, pleasant   Recent Labs: 08/18/2024: ALT 13; BUN 17; Creatinine, Ser 0.97; Hemoglobin 13.8; Platelets 232; Potassium 4.4; Sodium 138; TSH 1.380    Lipid Panel Lab Results  Component Value Date   CHOL 120 08/18/2024   HDL 62 08/18/2024   LDLCALC 44 08/18/2024   TRIG 69 08/18/2024    Wt Readings from Last 3 Encounters:  09/01/24 208 lb 3.2 oz (94.4 kg)  08/18/24 208 lb (94.3 kg)  06/02/24 200 lb 9.6 oz (91 kg)     ASSESSMENT AND PLAN:  Essential hypertension  Blood pressure is well controlled on today's visit. No changes made to the medications.  History of stroke Recent events reviewed Reviewed notes from neurology, MRI head, CT scan head neck We have recommended echocardiogram, also Zio monitor to rule out atrial fibrillation -Referral made to vascular  PAD Carotid disease, vertebral disease, right subclavian stenosis noted Recommend he discuss subclavian stenosis with vascular  CAD, multiple vessel Moderate to severe left circumflex disease tortuous vessel, occluded RCA Prior catheterization November 2018 Cholesterol at goal, nondiabetic, non-smoker Currently with no symptoms of angina. No further workup at this time.  Continue current medication regimen.  Mixed hyperlipidemia Cholesterol is at goal on the current lipid regimen. No changes to the medications were made.  Seizures Previous hospital admissions discussed with him from July 2019 No recent seizures Followed by Dr. Lane    No orders of the defined types were placed in this encounter.    Signed, Velinda Lunger, M.D., Ph.D. 10/19/2024  [facility] [place], [place] [phone]

## 2024-10-20 ENCOUNTER — Ambulatory Visit: Admitting: Cardiovascular Disease

## 2024-10-20 DIAGNOSIS — I70213 Atherosclerosis of native arteries of extremities with intermittent claudication, bilateral legs: Secondary | ICD-10-CM

## 2024-10-20 DIAGNOSIS — E782 Mixed hyperlipidemia: Secondary | ICD-10-CM

## 2024-10-20 DIAGNOSIS — I10 Essential (primary) hypertension: Secondary | ICD-10-CM

## 2024-10-20 DIAGNOSIS — I739 Peripheral vascular disease, unspecified: Secondary | ICD-10-CM

## 2024-10-20 DIAGNOSIS — I25118 Atherosclerotic heart disease of native coronary artery with other forms of angina pectoris: Secondary | ICD-10-CM

## 2024-10-21 ENCOUNTER — Encounter: Payer: Self-pay | Admitting: Cardiovascular Disease

## 2024-10-28 ENCOUNTER — Ambulatory Visit: Admitting: Cardiovascular Disease

## 2024-11-24 ENCOUNTER — Ambulatory Visit (INDEPENDENT_AMBULATORY_CARE_PROVIDER_SITE_OTHER): Admitting: Vascular Surgery

## 2024-11-24 ENCOUNTER — Encounter (INDEPENDENT_AMBULATORY_CARE_PROVIDER_SITE_OTHER)

## 2024-12-18 ENCOUNTER — Ambulatory Visit

## 2025-02-18 ENCOUNTER — Ambulatory Visit: Admitting: Family Medicine

## 2025-02-19 ENCOUNTER — Ambulatory Visit: Admitting: Family Medicine
